# Patient Record
Sex: Female | Born: 1960 | Race: Black or African American | Hispanic: No | Marital: Married | Smoking: Never smoker
Health system: Southern US, Community
[De-identification: ages and names within clinical notes are randomized; demographics above are authoritative.]

## PROBLEM LIST (undated history)

## (undated) DIAGNOSIS — C50919 Malignant neoplasm of unspecified site of unspecified female breast: Secondary | ICD-10-CM

## (undated) DIAGNOSIS — Z9221 Personal history of antineoplastic chemotherapy: Secondary | ICD-10-CM

## (undated) DIAGNOSIS — G629 Polyneuropathy, unspecified: Secondary | ICD-10-CM

## (undated) DIAGNOSIS — M549 Dorsalgia, unspecified: Secondary | ICD-10-CM

## (undated) DIAGNOSIS — J45909 Unspecified asthma, uncomplicated: Secondary | ICD-10-CM

## (undated) DIAGNOSIS — Z923 Personal history of irradiation: Secondary | ICD-10-CM

## (undated) DIAGNOSIS — E119 Type 2 diabetes mellitus without complications: Secondary | ICD-10-CM

## (undated) DIAGNOSIS — E7800 Pure hypercholesterolemia, unspecified: Secondary | ICD-10-CM

## (undated) DIAGNOSIS — I10 Essential (primary) hypertension: Secondary | ICD-10-CM

## (undated) HISTORY — PX: TONSILLECTOMY: SUR1361

## (undated) HISTORY — DX: Malignant neoplasm of unspecified site of unspecified female breast: C50.919

## (undated) HISTORY — PX: BACK SURGERY: SHX140

## (undated) HISTORY — DX: Type 2 diabetes mellitus without complications: E11.9

## (undated) HISTORY — DX: Dorsalgia, unspecified: M54.9

## (undated) HISTORY — DX: Pure hypercholesterolemia, unspecified: E78.00

## (undated) HISTORY — PX: COLONOSCOPY: SHX174

## (undated) HISTORY — DX: Unspecified asthma, uncomplicated: J45.909

## (undated) HISTORY — DX: Polyneuropathy, unspecified: G62.9

## (undated) HISTORY — PX: KNEE SURGERY: SHX244

## (undated) HISTORY — PX: BREAST LUMPECTOMY: SHX2

---

## 2006-01-31 DIAGNOSIS — Z923 Personal history of irradiation: Secondary | ICD-10-CM

## 2006-01-31 DIAGNOSIS — C50919 Malignant neoplasm of unspecified site of unspecified female breast: Secondary | ICD-10-CM

## 2006-01-31 HISTORY — DX: Malignant neoplasm of unspecified site of unspecified female breast: C50.919

## 2006-01-31 HISTORY — DX: Personal history of irradiation: Z92.3

## 2013-06-17 DIAGNOSIS — C50919 Malignant neoplasm of unspecified site of unspecified female breast: Secondary | ICD-10-CM | POA: Diagnosis not present

## 2013-07-04 DIAGNOSIS — E559 Vitamin D deficiency, unspecified: Secondary | ICD-10-CM | POA: Diagnosis not present

## 2013-07-04 DIAGNOSIS — E119 Type 2 diabetes mellitus without complications: Secondary | ICD-10-CM | POA: Diagnosis not present

## 2013-07-04 DIAGNOSIS — E785 Hyperlipidemia, unspecified: Secondary | ICD-10-CM | POA: Diagnosis not present

## 2013-07-04 DIAGNOSIS — E042 Nontoxic multinodular goiter: Secondary | ICD-10-CM | POA: Diagnosis not present

## 2013-07-04 DIAGNOSIS — E041 Nontoxic single thyroid nodule: Secondary | ICD-10-CM | POA: Diagnosis not present

## 2013-07-09 DIAGNOSIS — H04129 Dry eye syndrome of unspecified lacrimal gland: Secondary | ICD-10-CM | POA: Diagnosis not present

## 2013-07-09 DIAGNOSIS — E109 Type 1 diabetes mellitus without complications: Secondary | ICD-10-CM | POA: Diagnosis not present

## 2013-07-09 DIAGNOSIS — H2510 Age-related nuclear cataract, unspecified eye: Secondary | ICD-10-CM | POA: Diagnosis not present

## 2013-07-09 DIAGNOSIS — H43399 Other vitreous opacities, unspecified eye: Secondary | ICD-10-CM | POA: Diagnosis not present

## 2013-08-15 DIAGNOSIS — E041 Nontoxic single thyroid nodule: Secondary | ICD-10-CM | POA: Diagnosis not present

## 2013-08-15 DIAGNOSIS — E119 Type 2 diabetes mellitus without complications: Secondary | ICD-10-CM | POA: Diagnosis not present

## 2013-08-20 DIAGNOSIS — C50919 Malignant neoplasm of unspecified site of unspecified female breast: Secondary | ICD-10-CM | POA: Diagnosis not present

## 2013-08-20 DIAGNOSIS — Z1231 Encounter for screening mammogram for malignant neoplasm of breast: Secondary | ICD-10-CM | POA: Diagnosis not present

## 2013-09-04 DIAGNOSIS — Z23 Encounter for immunization: Secondary | ICD-10-CM | POA: Diagnosis not present

## 2013-10-08 DIAGNOSIS — IMO0002 Reserved for concepts with insufficient information to code with codable children: Secondary | ICD-10-CM | POA: Diagnosis not present

## 2013-10-08 DIAGNOSIS — Z79899 Other long term (current) drug therapy: Secondary | ICD-10-CM | POA: Diagnosis not present

## 2013-10-08 DIAGNOSIS — M5126 Other intervertebral disc displacement, lumbar region: Secondary | ICD-10-CM | POA: Diagnosis not present

## 2013-10-08 DIAGNOSIS — F339 Major depressive disorder, recurrent, unspecified: Secondary | ICD-10-CM | POA: Diagnosis not present

## 2013-10-08 DIAGNOSIS — G894 Chronic pain syndrome: Secondary | ICD-10-CM | POA: Diagnosis not present

## 2013-10-09 DIAGNOSIS — Z79899 Other long term (current) drug therapy: Secondary | ICD-10-CM | POA: Diagnosis not present

## 2013-10-17 DIAGNOSIS — E041 Nontoxic single thyroid nodule: Secondary | ICD-10-CM | POA: Diagnosis not present

## 2013-10-17 DIAGNOSIS — E042 Nontoxic multinodular goiter: Secondary | ICD-10-CM | POA: Diagnosis not present

## 2013-10-24 DIAGNOSIS — E7800 Pure hypercholesterolemia, unspecified: Secondary | ICD-10-CM | POA: Diagnosis not present

## 2013-10-24 DIAGNOSIS — I10 Essential (primary) hypertension: Secondary | ICD-10-CM | POA: Diagnosis not present

## 2013-10-24 DIAGNOSIS — IMO0001 Reserved for inherently not codable concepts without codable children: Secondary | ICD-10-CM | POA: Diagnosis not present

## 2013-10-24 DIAGNOSIS — E041 Nontoxic single thyroid nodule: Secondary | ICD-10-CM | POA: Diagnosis not present

## 2013-11-05 DIAGNOSIS — M5417 Radiculopathy, lumbosacral region: Secondary | ICD-10-CM | POA: Diagnosis not present

## 2013-11-05 DIAGNOSIS — Z79899 Other long term (current) drug therapy: Secondary | ICD-10-CM | POA: Diagnosis not present

## 2013-11-05 DIAGNOSIS — M791 Myalgia: Secondary | ICD-10-CM | POA: Diagnosis not present

## 2013-11-05 DIAGNOSIS — M545 Low back pain: Secondary | ICD-10-CM | POA: Diagnosis not present

## 2013-11-05 DIAGNOSIS — R825 Elevated urine levels of drugs, medicaments and biological substances: Secondary | ICD-10-CM | POA: Diagnosis not present

## 2013-11-05 DIAGNOSIS — Z79891 Long term (current) use of opiate analgesic: Secondary | ICD-10-CM | POA: Diagnosis not present

## 2013-11-05 DIAGNOSIS — G894 Chronic pain syndrome: Secondary | ICD-10-CM | POA: Diagnosis not present

## 2013-11-12 DIAGNOSIS — M792 Neuralgia and neuritis, unspecified: Secondary | ICD-10-CM | POA: Diagnosis not present

## 2013-11-12 DIAGNOSIS — M5417 Radiculopathy, lumbosacral region: Secondary | ICD-10-CM | POA: Diagnosis not present

## 2013-11-12 DIAGNOSIS — G894 Chronic pain syndrome: Secondary | ICD-10-CM | POA: Diagnosis not present

## 2013-12-10 DIAGNOSIS — M5127 Other intervertebral disc displacement, lumbosacral region: Secondary | ICD-10-CM | POA: Diagnosis not present

## 2013-12-10 DIAGNOSIS — M792 Neuralgia and neuritis, unspecified: Secondary | ICD-10-CM | POA: Diagnosis not present

## 2013-12-10 DIAGNOSIS — M5417 Radiculopathy, lumbosacral region: Secondary | ICD-10-CM | POA: Diagnosis not present

## 2013-12-10 DIAGNOSIS — G894 Chronic pain syndrome: Secondary | ICD-10-CM | POA: Diagnosis not present

## 2014-01-02 DIAGNOSIS — Z79899 Other long term (current) drug therapy: Secondary | ICD-10-CM | POA: Diagnosis not present

## 2014-01-09 DIAGNOSIS — C50911 Malignant neoplasm of unspecified site of right female breast: Secondary | ICD-10-CM | POA: Diagnosis not present

## 2014-01-09 DIAGNOSIS — C50919 Malignant neoplasm of unspecified site of unspecified female breast: Secondary | ICD-10-CM | POA: Diagnosis not present

## 2014-01-16 DIAGNOSIS — T451X5A Adverse effect of antineoplastic and immunosuppressive drugs, initial encounter: Secondary | ICD-10-CM | POA: Diagnosis not present

## 2014-01-16 DIAGNOSIS — E041 Nontoxic single thyroid nodule: Secondary | ICD-10-CM | POA: Diagnosis not present

## 2014-01-16 DIAGNOSIS — E1165 Type 2 diabetes mellitus with hyperglycemia: Secondary | ICD-10-CM | POA: Diagnosis not present

## 2014-01-16 DIAGNOSIS — I10 Essential (primary) hypertension: Secondary | ICD-10-CM | POA: Diagnosis not present

## 2014-01-16 DIAGNOSIS — G620 Drug-induced polyneuropathy: Secondary | ICD-10-CM | POA: Diagnosis not present

## 2014-02-05 DIAGNOSIS — J45901 Unspecified asthma with (acute) exacerbation: Secondary | ICD-10-CM | POA: Diagnosis not present

## 2014-02-05 DIAGNOSIS — E119 Type 2 diabetes mellitus without complications: Secondary | ICD-10-CM | POA: Diagnosis not present

## 2014-02-05 DIAGNOSIS — R05 Cough: Secondary | ICD-10-CM | POA: Diagnosis not present

## 2014-02-05 DIAGNOSIS — I10 Essential (primary) hypertension: Secondary | ICD-10-CM | POA: Diagnosis not present

## 2014-02-27 DIAGNOSIS — Z79891 Long term (current) use of opiate analgesic: Secondary | ICD-10-CM | POA: Diagnosis not present

## 2014-03-06 DIAGNOSIS — C50911 Malignant neoplasm of unspecified site of right female breast: Secondary | ICD-10-CM | POA: Diagnosis not present

## 2014-03-13 DIAGNOSIS — M5417 Radiculopathy, lumbosacral region: Secondary | ICD-10-CM | POA: Diagnosis not present

## 2014-03-13 DIAGNOSIS — G894 Chronic pain syndrome: Secondary | ICD-10-CM | POA: Diagnosis not present

## 2014-03-13 DIAGNOSIS — M5127 Other intervertebral disc displacement, lumbosacral region: Secondary | ICD-10-CM | POA: Diagnosis not present

## 2014-03-13 DIAGNOSIS — M792 Neuralgia and neuritis, unspecified: Secondary | ICD-10-CM | POA: Diagnosis not present

## 2014-03-27 DIAGNOSIS — Z79891 Long term (current) use of opiate analgesic: Secondary | ICD-10-CM | POA: Diagnosis not present

## 2014-04-24 DIAGNOSIS — Z79891 Long term (current) use of opiate analgesic: Secondary | ICD-10-CM | POA: Diagnosis not present

## 2014-05-05 DIAGNOSIS — M791 Myalgia: Secondary | ICD-10-CM | POA: Diagnosis not present

## 2014-05-05 DIAGNOSIS — M47817 Spondylosis without myelopathy or radiculopathy, lumbosacral region: Secondary | ICD-10-CM | POA: Diagnosis not present

## 2014-05-05 DIAGNOSIS — G894 Chronic pain syndrome: Secondary | ICD-10-CM | POA: Diagnosis not present

## 2014-05-08 DIAGNOSIS — E042 Nontoxic multinodular goiter: Secondary | ICD-10-CM | POA: Diagnosis not present

## 2014-05-08 DIAGNOSIS — E119 Type 2 diabetes mellitus without complications: Secondary | ICD-10-CM | POA: Diagnosis not present

## 2014-05-08 DIAGNOSIS — G629 Polyneuropathy, unspecified: Secondary | ICD-10-CM | POA: Diagnosis not present

## 2014-05-08 DIAGNOSIS — I10 Essential (primary) hypertension: Secondary | ICD-10-CM | POA: Diagnosis not present

## 2014-05-08 DIAGNOSIS — E559 Vitamin D deficiency, unspecified: Secondary | ICD-10-CM | POA: Diagnosis not present

## 2014-05-08 DIAGNOSIS — E780 Pure hypercholesterolemia: Secondary | ICD-10-CM | POA: Diagnosis not present

## 2014-05-22 DIAGNOSIS — Z79891 Long term (current) use of opiate analgesic: Secondary | ICD-10-CM | POA: Diagnosis not present

## 2014-06-03 DIAGNOSIS — M47817 Spondylosis without myelopathy or radiculopathy, lumbosacral region: Secondary | ICD-10-CM | POA: Diagnosis not present

## 2014-06-03 DIAGNOSIS — M791 Myalgia: Secondary | ICD-10-CM | POA: Diagnosis not present

## 2014-06-03 DIAGNOSIS — G894 Chronic pain syndrome: Secondary | ICD-10-CM | POA: Diagnosis not present

## 2014-06-12 DIAGNOSIS — C50911 Malignant neoplasm of unspecified site of right female breast: Secondary | ICD-10-CM | POA: Diagnosis not present

## 2014-06-16 DIAGNOSIS — Z79891 Long term (current) use of opiate analgesic: Secondary | ICD-10-CM | POA: Diagnosis not present

## 2014-07-23 DIAGNOSIS — Z79891 Long term (current) use of opiate analgesic: Secondary | ICD-10-CM | POA: Diagnosis not present

## 2014-08-18 DIAGNOSIS — Z79891 Long term (current) use of opiate analgesic: Secondary | ICD-10-CM | POA: Diagnosis not present

## 2014-08-18 DIAGNOSIS — M4316 Spondylolisthesis, lumbar region: Secondary | ICD-10-CM | POA: Diagnosis not present

## 2014-08-18 DIAGNOSIS — M5416 Radiculopathy, lumbar region: Secondary | ICD-10-CM | POA: Diagnosis not present

## 2014-08-18 DIAGNOSIS — M961 Postlaminectomy syndrome, not elsewhere classified: Secondary | ICD-10-CM | POA: Diagnosis not present

## 2014-08-18 DIAGNOSIS — Z1389 Encounter for screening for other disorder: Secondary | ICD-10-CM | POA: Diagnosis not present

## 2014-08-18 DIAGNOSIS — M4806 Spinal stenosis, lumbar region: Secondary | ICD-10-CM | POA: Diagnosis not present

## 2014-10-10 DIAGNOSIS — M5416 Radiculopathy, lumbar region: Secondary | ICD-10-CM | POA: Diagnosis not present

## 2014-10-17 DIAGNOSIS — M545 Low back pain: Secondary | ICD-10-CM | POA: Diagnosis not present

## 2014-10-20 DIAGNOSIS — E1036 Type 1 diabetes mellitus with diabetic cataract: Secondary | ICD-10-CM | POA: Diagnosis not present

## 2014-10-20 DIAGNOSIS — H35363 Drusen (degenerative) of macula, bilateral: Secondary | ICD-10-CM | POA: Diagnosis not present

## 2014-10-24 DIAGNOSIS — M5416 Radiculopathy, lumbar region: Secondary | ICD-10-CM | POA: Diagnosis not present

## 2014-10-29 DIAGNOSIS — E1165 Type 2 diabetes mellitus with hyperglycemia: Secondary | ICD-10-CM | POA: Diagnosis not present

## 2014-10-29 DIAGNOSIS — I10 Essential (primary) hypertension: Secondary | ICD-10-CM | POA: Diagnosis not present

## 2014-10-29 DIAGNOSIS — Z23 Encounter for immunization: Secondary | ICD-10-CM | POA: Diagnosis not present

## 2014-10-29 DIAGNOSIS — J45909 Unspecified asthma, uncomplicated: Secondary | ICD-10-CM | POA: Diagnosis not present

## 2014-10-29 DIAGNOSIS — M5126 Other intervertebral disc displacement, lumbar region: Secondary | ICD-10-CM | POA: Diagnosis not present

## 2014-11-03 DIAGNOSIS — M5416 Radiculopathy, lumbar region: Secondary | ICD-10-CM | POA: Diagnosis not present

## 2014-11-25 DIAGNOSIS — I10 Essential (primary) hypertension: Secondary | ICD-10-CM | POA: Diagnosis not present

## 2014-11-25 DIAGNOSIS — M5126 Other intervertebral disc displacement, lumbar region: Secondary | ICD-10-CM | POA: Diagnosis not present

## 2014-11-25 DIAGNOSIS — G629 Polyneuropathy, unspecified: Secondary | ICD-10-CM | POA: Diagnosis not present

## 2014-11-25 DIAGNOSIS — E1165 Type 2 diabetes mellitus with hyperglycemia: Secondary | ICD-10-CM | POA: Diagnosis not present

## 2014-12-02 DIAGNOSIS — M5416 Radiculopathy, lumbar region: Secondary | ICD-10-CM | POA: Diagnosis not present

## 2014-12-08 ENCOUNTER — Ambulatory Visit (HOSPITAL_COMMUNITY): Payer: Self-pay | Admitting: Hematology & Oncology

## 2014-12-15 DIAGNOSIS — M4317 Spondylolisthesis, lumbosacral region: Secondary | ICD-10-CM | POA: Diagnosis not present

## 2014-12-22 ENCOUNTER — Encounter (HOSPITAL_COMMUNITY): Payer: BLUE CROSS/BLUE SHIELD | Attending: Hematology & Oncology | Admitting: Hematology & Oncology

## 2014-12-22 ENCOUNTER — Encounter (HOSPITAL_COMMUNITY): Payer: Self-pay | Admitting: Hematology & Oncology

## 2014-12-22 VITALS — BP 140/85 | HR 18 | Temp 98.5°F | Ht 64.0 in | Wt 150.8 lb

## 2014-12-22 DIAGNOSIS — E559 Vitamin D deficiency, unspecified: Secondary | ICD-10-CM | POA: Diagnosis not present

## 2014-12-22 DIAGNOSIS — Z139 Encounter for screening, unspecified: Secondary | ICD-10-CM | POA: Diagnosis not present

## 2014-12-22 DIAGNOSIS — Z780 Asymptomatic menopausal state: Secondary | ICD-10-CM | POA: Insufficient documentation

## 2014-12-22 DIAGNOSIS — Z79899 Other long term (current) drug therapy: Secondary | ICD-10-CM | POA: Diagnosis not present

## 2014-12-22 DIAGNOSIS — M549 Dorsalgia, unspecified: Secondary | ICD-10-CM | POA: Diagnosis not present

## 2014-12-22 DIAGNOSIS — Z79811 Long term (current) use of aromatase inhibitors: Secondary | ICD-10-CM | POA: Diagnosis not present

## 2014-12-22 DIAGNOSIS — C50412 Malignant neoplasm of upper-outer quadrant of left female breast: Secondary | ICD-10-CM | POA: Insufficient documentation

## 2014-12-22 DIAGNOSIS — C50912 Malignant neoplasm of unspecified site of left female breast: Secondary | ICD-10-CM

## 2014-12-22 DIAGNOSIS — G620 Drug-induced polyneuropathy: Secondary | ICD-10-CM | POA: Diagnosis not present

## 2014-12-22 DIAGNOSIS — Z9889 Other specified postprocedural states: Secondary | ICD-10-CM

## 2014-12-22 LAB — COMPREHENSIVE METABOLIC PANEL
ALT: 16 U/L (ref 14–54)
AST: 18 U/L (ref 15–41)
Albumin: 4 g/dL (ref 3.5–5.0)
Alkaline Phosphatase: 62 U/L (ref 38–126)
Anion gap: 7 (ref 5–15)
BUN: 11 mg/dL (ref 6–20)
CO2: 31 mmol/L (ref 22–32)
Calcium: 10.2 mg/dL (ref 8.9–10.3)
Chloride: 102 mmol/L (ref 101–111)
Creatinine, Ser: 0.67 mg/dL (ref 0.44–1.00)
GFR calc Af Amer: >60 mL/min (ref >60)
GFR calc non Af Amer: >60 mL/min (ref >60)
Glucose, Bld: 72 mg/dL (ref 65–99)
Potassium: 3.9 mmol/L (ref 3.5–5.1)
Sodium: 140 mmol/L (ref 135–145)
Total Bilirubin: 0.4 mg/dL (ref 0.3–1.2)
Total Protein: 8.2 g/dL — ABNORMAL HIGH (ref 6.5–8.1)

## 2014-12-22 LAB — CBC WITH DIFFERENTIAL/PLATELET
Basophils Absolute: 0 10*3/uL (ref 0.0–0.1)
Basophils Relative: 0 %
Eosinophils Absolute: 0.1 10*3/uL (ref 0.0–0.7)
Eosinophils Relative: 1 %
HCT: 40.2 % (ref 36.0–46.0)
Hemoglobin: 13.2 g/dL (ref 12.0–15.0)
Lymphocytes Relative: 32 %
Lymphs Abs: 3.3 10*3/uL (ref 0.7–4.0)
MCH: 26.8 pg (ref 26.0–34.0)
MCHC: 32.8 g/dL (ref 30.0–36.0)
MCV: 81.7 fL (ref 78.0–100.0)
Monocytes Absolute: 0.7 10*3/uL (ref 0.1–1.0)
Monocytes Relative: 7 %
Neutro Abs: 6 10*3/uL (ref 1.7–7.7)
Neutrophils Relative %: 60 %
Platelets: 308 10*3/uL (ref 150–400)
RBC: 4.92 MIL/uL (ref 3.87–5.11)
RDW: 14.4 % (ref 11.5–15.5)
WBC: 10.2 10*3/uL (ref 4.0–10.5)
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
## 2014-12-22 NOTE — Progress Notes (Signed)
[name]'s reason for visit today is for labs as scheduled per MD orders.  Venipuncture performed with a 23 gauge butterfly needle to R Antecubital.  Krista Doyle tolerated procedure well and without incident; questions were answered and patient was discharged.

## 2014-12-22 NOTE — Patient Instructions (Addendum)
[place] at [facility] Discharge Instructions  RECOMMENDATIONS MADE BY THE CONSULTANT AND ANY TEST RESULTS WILL BE SENT TO YOUR REFERRING PHYSICIAN.   Exam completed by Dr Whitney Muse today. Lab work today. Prescription given for compression sleeve and glove. We will put you on the list for genetic counseling, we will call you with the appt.  We will get the records from [facility].   We will get you scheduled for a mammogram.  We will get a bone density scheduled for you also. Start taking Calcium 1200 mg with vitamin D 1000-2000 mg. Return to see the doctor in 6 months to see the doctor. Please call the clinic if you have any questions or concerns.     Thank you for choosing [place] at [facility] to provide your oncology and hematology care.  To afford each patient quality time with our provider, please arrive at least 15 minutes before your scheduled appointment time.    You need to re-schedule your appointment should you arrive 10 or more minutes late.  We strive to give you quality time with our providers, and arriving late affects you and other patients whose appointments are after yours.  Also, if you no show three or more times for appointments you may be dismissed from the clinic at the providers discretion.     Again, thank you for choosing [facility].  Our hope is that these requests will decrease the amount of time that you wait before being seen by our physicians.       _____________________________________________________________  Should you have questions after your visit to [facility], please contact our office at [phone] between the hours of 8:30 a.m. and 4:30 p.m.  Voicemails left after 4:30 p.m. will not be returned until the following business day.  For prescription refill requests, have your pharmacy contact our office.

## 2014-12-23 ENCOUNTER — Other Ambulatory Visit (HOSPITAL_COMMUNITY): Payer: Self-pay | Admitting: *Deleted

## 2014-12-23 LAB — VITAMIN D 25 HYDROXY (VIT D DEFICIENCY, FRACTURES): Vit D, 25-Hydroxy: 9.3 ng/mL — ABNORMAL LOW (ref 30.0–100.0)

## 2014-12-23 MED ORDER — ERGOCALCIFEROL 1.25 MG (50000 UT) PO CAPS: 50000.0000 [IU] | ORAL_CAPSULE | ORAL | Status: DC

## 2014-12-31 ENCOUNTER — Other Ambulatory Visit (HOSPITAL_COMMUNITY): Payer: BLUE CROSS/BLUE SHIELD

## 2014-12-31 ENCOUNTER — Ambulatory Visit (HOSPITAL_COMMUNITY): Payer: BLUE CROSS/BLUE SHIELD

## 2014-12-31 ENCOUNTER — Other Ambulatory Visit (HOSPITAL_COMMUNITY): Payer: Self-pay | Admitting: Hematology & Oncology

## 2015-01-02 ENCOUNTER — Encounter: Payer: Self-pay | Admitting: Physical Medicine & Rehabilitation

## 2015-01-07 ENCOUNTER — Other Ambulatory Visit (HOSPITAL_COMMUNITY): Payer: BLUE CROSS/BLUE SHIELD

## 2015-01-29 ENCOUNTER — Encounter (HOSPITAL_COMMUNITY): Payer: BLUE CROSS/BLUE SHIELD | Admitting: Genetic Counselor

## 2015-02-02 DIAGNOSIS — E559 Vitamin D deficiency, unspecified: Secondary | ICD-10-CM | POA: Insufficient documentation

## 2015-02-11 ENCOUNTER — Encounter: Payer: Self-pay | Admitting: Physical Medicine & Rehabilitation

## 2015-02-11 ENCOUNTER — Encounter
Payer: BLUE CROSS/BLUE SHIELD | Attending: Physical Medicine & Rehabilitation | Admitting: Physical Medicine & Rehabilitation

## 2015-02-11 VITALS — BP 119/79 | HR 73 | Resp 14

## 2015-02-11 DIAGNOSIS — Z79899 Other long term (current) drug therapy: Secondary | ICD-10-CM

## 2015-02-11 DIAGNOSIS — G8929 Other chronic pain: Secondary | ICD-10-CM | POA: Diagnosis not present

## 2015-02-11 DIAGNOSIS — I890 Lymphedema, not elsewhere classified: Secondary | ICD-10-CM | POA: Diagnosis not present

## 2015-02-11 DIAGNOSIS — G620 Drug-induced polyneuropathy: Secondary | ICD-10-CM | POA: Diagnosis not present

## 2015-02-11 DIAGNOSIS — C50412 Malignant neoplasm of upper-outer quadrant of left female breast: Secondary | ICD-10-CM | POA: Diagnosis not present

## 2015-02-11 DIAGNOSIS — M545 Low back pain: Secondary | ICD-10-CM | POA: Insufficient documentation

## 2015-02-11 DIAGNOSIS — M5416 Radiculopathy, lumbar region: Secondary | ICD-10-CM | POA: Diagnosis not present

## 2015-02-11 DIAGNOSIS — Z853 Personal history of malignant neoplasm of breast: Secondary | ICD-10-CM | POA: Insufficient documentation

## 2015-02-11 DIAGNOSIS — Z5181 Encounter for therapeutic drug level monitoring: Secondary | ICD-10-CM | POA: Diagnosis not present

## 2015-02-11 DIAGNOSIS — Z9221 Personal history of antineoplastic chemotherapy: Secondary | ICD-10-CM | POA: Diagnosis not present

## 2015-02-11 DIAGNOSIS — M47816 Spondylosis without myelopathy or radiculopathy, lumbar region: Secondary | ICD-10-CM

## 2015-02-11 DIAGNOSIS — T451X5A Adverse effect of antineoplastic and immunosuppressive drugs, initial encounter: Secondary | ICD-10-CM | POA: Diagnosis not present

## 2015-02-11 MED ORDER — LIDOCAINE 5 % EX PTCH: 1.0000 | MEDICATED_PATCH | CUTANEOUS | Status: DC

## 2015-02-11 MED ORDER — PREGABALIN 50 MG PO CAPS
50.0000 mg | ORAL_CAPSULE | Freq: Three times a day (TID) | ORAL | Status: DC
Start: 2015-02-11 — End: 2015-03-11

## 2015-02-11 NOTE — Patient Instructions (Signed)
ONCE I HAVE CONFIRMATION THAT YOUR URINE SPECIMEN IS CONSISTENT WITH YOUR HISTORY AND PRESCRIBED MEDICATIONS, I WILL BE WILLING TO PRESCRIBE YOUR PAIN MEDICATION. THE RESULTS OF YOUR URINE TESTING COULD TAKE A WEEK OR MORE TO RETURN, HOWEVER.  IF WE DO NOT CONTACT YOU REGARDING THESE RESULTS WITHIN 10 DAYS, PLEASE CONTACT US.     

## 2015-02-11 NOTE — Progress Notes (Signed)
Subjective:    Patient ID: Krista Doyle, female    DOB: 12-Jun-1960, 56 y.o.   MRN: [id number]  HPI   This is an initial office visit for Krista Doyle who was referred here by Dr. Phylliss Bob. She is a pleasant 55 yo [place] American female with chronic low back pain. She moved to [place] over the summer from [place] and had been followed previously by a spine clinic where she has received numerous injections and pharmaceutical therapy for her pain. Xrays done by Dr. Lynann Bologna in September revealed grade 1 spondylolisthesis of L4 on L5 with no other obviously abnormalities by his report. She states that surgery had been recommended by her spine surgeon in [place] to "relieve pressure on the nerves" in her low back. She has had chronic numbness in her feet and hand due to CTX for her breast cancer (last rx in 2011). Her low back pain is continuous and typically radiates down her right leg into the foot--the pain is sometimes anterior and sometimes posterior on the right leg. She has difficulty finding any comfortable positions. She has to move constantly and re-arrange pillows at night. She can only walk short dx before she has to stop because of the low back pain. She has had some falls because of her low back and right leg pain as well as the numbness in her feet. She is using a walker which has helped her quite a bit.  Dr. Lynann Bologna   arranged ESI's per Dr. Ronalee Red ,at her L4-5 level apparently, without any results. She reports no benefits with prior injections in [place] either.   For pain relief she's currently using hydrocodone 2-3 x per day. She has been stretching them in anticipation of this visit. In [place] she had been taking percocet 10/325, two-three x per day which gave her partial relief. She had been on lyrica and gabapentin for her nerve related pain---she is not sure if they helped. She may have taken cymbalta also. She was given a trial of meloxicam this fall (2 weeks) without  benefit  She also struggles with lymphedema in her LUE related to her breast cancer and associated surgeries (x3). She is on femara currently and her cancer is in remission.   Pain Inventory Average Pain 8 Pain Right Now 8 My pain is constant, stabbing and aching  In the last 24 hours, has pain interfered with the following? General activity 8 Relation with others 10 Enjoyment of life 9 What TIME of day is your pain at its worst? evening, night Sleep (in general) Poor  Pain is worse with: walking, bending, sitting, standing and some activites Pain improves with: medication Relief from Meds: 6  Mobility walk without assistance walk with assistance use a cane use a walker ability to climb steps?  no do you drive?  yes  Function disabled: date disabled .  Neuro/Psych weakness numbness tingling trouble walking  Prior Studies new visit  Physicians involved in your care new visit   History reviewed. No pertinent family history. Social History   Social History  . Marital Status: Married    Spouse Name: N/A  . Number of Children: N/A  . Years of Education: N/A   Social History Main Topics  . Smoking status: Never Smoker   . Smokeless tobacco: None  . Alcohol Use: No  . Drug Use: None  . Sexual Activity: Yes   Other Topics Concern  . None   Social History Narrative   History reviewed.  No pertinent past surgical history. Past Medical History  Diagnosis Date  . Breast cancer ([facility])     left   BP 119/79 mmHg  Pulse 73  Resp 14  SpO2 99%  Opioid Risk Score:   Fall Risk Score:  `1  Depression screen PHQ 2/9  Depression screen PHQ 2/9 02/11/2015  Decreased Interest 1  Down, Depressed, Hopeless 0  PHQ - 2 Score 1  Altered sleeping 3  Tired, decreased energy 0  Change in appetite 0  Feeling bad or failure about yourself  0  Trouble concentrating 0  Moving slowly or fidgety/restless 0  Suicidal thoughts 0  PHQ-9 Score 4  Difficult doing  work/chores Somewhat difficult     Review of Systems  Cardiovascular: Positive for leg swelling.  Endocrine:       High blood sugar   All other systems reviewed and are negative.      Objective:   Physical Exam   General: Alert and oriented x 3, No apparent distress HEENT: Head is normocephalic, atraumatic, PERRLA, EOMI, sclera anicteric, oral mucosa pink and moist, dentition intact, ext ear canals clear,  Neck: Supple without JVD or lymphadenopathy Heart: Reg rate and rhythm. No murmurs rubs or gallops Chest: CTA bilaterally without wheezes, rales, or rhonchi; no distress Abdomen: Soft, non-tender, non-distended, bowel sounds positive. Extremities: No clubbing, cyanosis, or edema. Pulses are 2+ Skin: Clean and intact without signs of breakdown Neuro: Pt is cognitively appropriate with normal insight, memory, and awareness. Cranial nerves 2-12 are intact. Sensory exam is diminished to LT in the finger tips as well as the distal half of both feet (perhaps sl loss in the proximal foot also). she has difficulty with balance as a result. . Reflexes are 1+ to absent in all 4's. Fine motor coordination is impaired due to pain,weakness, sensory loss. No tremors. Motor function is grossly inconsistent and 3-4/5 in the LE and 4-5/5 in the uppers.  Musculoskeletal: she is very limited with all planes of lumbar ROM---she has perhaps 20 lumbar flexion, 5 deg extension, 10 degrees Psych: Pt's affect is appropriate. Pt is cooperative        Assessment & Plan:  1. Chronic low back pain, lumbar spondylosis, spondylolisthesis, likely lumbar-sacral radiculopathy. Second hand report of lumbar xray images was fairly unremarkable  -need MRI results (study pending)  -lidoderm patch trial  -NSAID trial?   -UDS was collected. If consistent, we can look at prescribing narcotics. We can initiate a percocet trial closer to her home regimen. 10/325- two to three tabs daily prn  -consider other  intervention/therapy based on findings above.  2. Chemotherapy induced peripheral neuropathy  -lyrica trial. Begin at 50mg  bid and titrate to TID.  3. Breast Cancer in remission.  Thirty minutes of face to face patient care time were spent during this visit. All questions were encouraged and answered. We'll see her back in about a month.    Meredith Staggers, MD, [facility] 02/11/2015

## 2015-02-12 ENCOUNTER — Encounter (HOSPITAL_COMMUNITY): Payer: Self-pay | Admitting: Genetic Counselor

## 2015-02-12 ENCOUNTER — Encounter (HOSPITAL_COMMUNITY): Payer: BLUE CROSS/BLUE SHIELD

## 2015-02-12 ENCOUNTER — Telehealth: Payer: Self-pay | Admitting: Genetic Counselor

## 2015-02-12 ENCOUNTER — Encounter (HOSPITAL_COMMUNITY): Payer: BLUE CROSS/BLUE SHIELD | Attending: Hematology & Oncology | Admitting: Genetic Counselor

## 2015-02-12 DIAGNOSIS — Z780 Asymptomatic menopausal state: Secondary | ICD-10-CM | POA: Insufficient documentation

## 2015-02-12 DIAGNOSIS — Z853 Personal history of malignant neoplasm of breast: Secondary | ICD-10-CM | POA: Diagnosis not present

## 2015-02-12 DIAGNOSIS — Z79899 Other long term (current) drug therapy: Secondary | ICD-10-CM | POA: Insufficient documentation

## 2015-02-12 DIAGNOSIS — Z315 Encounter for genetic counseling: Secondary | ICD-10-CM

## 2015-02-12 DIAGNOSIS — Z139 Encounter for screening, unspecified: Secondary | ICD-10-CM | POA: Insufficient documentation

## 2015-02-12 DIAGNOSIS — C50412 Malignant neoplasm of upper-outer quadrant of left female breast: Secondary | ICD-10-CM

## 2015-02-12 NOTE — Progress Notes (Signed)
Krista Doyle's reason for visit today is for labs as scheduled per MD orders.  Venipuncture performed with a 23 gauge butterfly needle to R Antecubital.  Krista Doyle tolerated procedure well and without incident; questions were answered and patient was discharged.

## 2015-02-12 NOTE — Progress Notes (Signed)
REFERRING PROVIDER: [name], MD [place], [place] [address]   Ancil Linsey, MD  PRIMARY PROVIDER:  [name], MD  PRIMARY REASON FOR VISIT:  1. Breast cancer of upper-outer quadrant of left female breast ([place])      HISTORY OF PRESENT ILLNESS:   Ms. Schimek, a 55 y.o. female, was seen for a [place] cancer genetics consultation at the request of Dr. Whitney Muse due to a personal history of cancer.  Ms. Bress presents to clinic today to discuss the possibility of a hereditary predisposition to cancer, genetic testing, and to further clarify her future cancer risks, as well as potential cancer risks for family members.   In 2008, at the age of 68, Ms. Carter-Montagna was diagnosed with invasive ductal carcinoma of the breast. The tumor was ER+/PR+/Her2-.  This was treated with chemotherapy, lumpectomy and radiation.  She also took tamoxifen.    CANCER HISTORY:   No history exists.     HORMONAL RISK FACTORS:  Menarche was at age 73.  First live birth at age 23.  OCP use for approximately 2 years.  Ovaries intact: yes.  Hysterectomy: yes.  Menopausal status: postmenopausal.  HRT use: 0 years. Colonoscopy: yes; normal. Mammogram within the last year: yes. Number of breast biopsies: 2. Up to date with pelvic exams:  yes. Any excessive radiation exposure in the past:  no  Past Medical History  Diagnosis Date  . Breast cancer ([place])     left    History reviewed. No pertinent past surgical history.  Social History   Social History  . Marital Status: Married    Spouse Name: N/A  . Number of Children: N/A  . Years of Education: N/A   Social History Main Topics  . Smoking status: Never Smoker   . Smokeless tobacco: None  . Alcohol Use: No  . Drug Use: None  . Sexual Activity: Yes   Other Topics Concern  . None   Social History Narrative     FAMILY HISTORY:  We obtained a detailed, 4-generation family history.  Significant  diagnoses are listed below: History reviewed. No pertinent family history.  The patient has three biological children and two adopted children who are cancer free.  She has two brothers and a sister who are cancer free.  Both parents are deceased.  Her mother died at age 28 from a gun shot wound, and her father died at 12 from a car accident.  Both parents were only children.  The patient's maternal grandparents are deceased, and her paternal grandmother is alive at 12.  Patient's maternal ancestors are of African American descent, and paternal ancestors are of African American descent. There is no reported Ashkenazi Jewish ancestry. There is no known consanguinity.  GENETIC COUNSELING ASSESSMENT: Lylith Bebeau is a 55 y.o. female with a personal history of cancer and a limited family history which is somewhat suggestive of a hereditary cancer syndrome and predisposition to cancer. We, therefore, discussed and recommended the following at today's visit.   DISCUSSION: We discussed that 5-10% of breast cancer is due to hereditary cancer syndromes.  Most are the result of BRCA mutations.  There are other genes that can also increase the risk for breast cancer, and the most common genes seen in our population include PALB2, ATM and CHEK2.  The patient has a limited family history.  Her sister is under 46, and both parents are only children.  Her mother died at age 21.  We reviewed the characteristics,  features and inheritance patterns of hereditary cancer syndromes. We also discussed genetic testing, including the appropriate family members to test, the process of testing, insurance coverage and turn-around-time for results. We discussed the implications of a negative, positive and/or variant of uncertain significant result. We recommended Ms. Carter-Bacchi pursue genetic testing for the Breast/Ovarian cancer gene panel. The Breast/Ovarian gene panel offered by [facility] includes sequencing and rearrangement  analysis for the following 20 genes:  ATM, BARD1, BRCA1, BRCA2, BRIP1, CDH1, CHEK2, EPCAM, FANCC, MLH1, MSH2, MSH6, NBN, PALB2, PMS2, PTEN, RAD51C, RAD51D, TP53, and XRCC2.     Based on Ms. Carter-Schlicker's personal history of cancer, she meets medical criteria for genetic testing. Despite that she meets criteria, she may still have an out of pocket cost. We discussed that if her out of pocket cost for testing is over $100, the laboratory will call and confirm whether she wants to proceed with testing.  If the out of pocket cost of testing is less than $100 she will be billed by the genetic testing laboratory.   PLAN: After considering the risks, benefits, and limitations, Ms. Connelley  provided informed consent to pursue genetic testing and the blood sample was sent to [facility] for analysis of the Breast/Ovarian cancer panel. Results should be available within approximately 2-3 weeks' time, at which point they will be disclosed by telephone to Ms. Carter-Baldo, as will any additional recommendations warranted by these results. Ms. Bottari will receive a summary of her genetic counseling visit and a copy of her results once available. This information will also be available in Epic. We encouraged Ms. Carter-Jacques to remain in contact with cancer genetics annually so that we can continuously update the family history and inform her of any changes in cancer genetics and testing that may be of benefit for her family. Ms. Cummings questions were answered to her satisfaction today. Our contact information was provided should additional questions or concerns arise.  Lastly, we encouraged Ms. Carter-Landau to remain in contact with cancer genetics annually so that we can continuously update the family history and inform her of any changes in cancer genetics and testing that may be of benefit for this family.   Ms.  Cimini questions were answered to her satisfaction today. Our contact  information was provided should additional questions or concerns arise. Thank you for the referral and allowing [place] to share in the care of your patient.   Alaiya Martindelcampo P. Florene Glen, [place], [facility] Certified Genetic Counselor [email or web address] phone: [phone]  The patient was seen for a total of 60 minutes in face-to-face genetic counseling.  This patient was discussed with Drs. Magrinat, Lindi Adie and/or Burr Medico who agrees with the above.    _______________________________________________________________________ For Office Staff:  Number of people involved in session: 2 Was an Intern/ student involved with case: no

## 2015-02-12 NOTE — Telephone Encounter (Signed)
Confirmed with patient that she was going to keep her appointment.

## 2015-02-13 ENCOUNTER — Telehealth: Payer: Self-pay | Admitting: Physical Medicine & Rehabilitation

## 2015-02-13 NOTE — Telephone Encounter (Signed)
pt is returning a call about lidocaine patches - she still has not received them - pharmacy has faxed to the office but hasnt gotten a response - pt wanted medical ssistant to know what the pharmacy said

## 2015-02-13 NOTE — Addendum Note (Signed)
Addended by: Clarene Essex on: 02/13/2015 09:34 AM   Modules accepted: Miquel Dunn

## 2015-02-18 NOTE — Telephone Encounter (Signed)
Patient needing to know status of her pre auth for her Lidocaine patches-please call her at [phone].

## 2015-02-19 NOTE — Telephone Encounter (Signed)
Prior [facility] via covermymeds, patient notified

## 2015-02-20 ENCOUNTER — Telehealth: Payer: Self-pay | Admitting: *Deleted

## 2015-02-20 LAB — TOXASSURE SELECT,+ANTIDEPR,UR: PDF: 0

## 2015-02-20 MED ORDER — OXYCODONE-ACETAMINOPHEN 10-325 MG PO TABS: 1.0000 | ORAL_TABLET | Freq: Three times a day (TID) | ORAL | Status: DC | PRN

## 2015-02-20 NOTE — Telephone Encounter (Signed)
Pt was asked to call back regarding her UDS results. We received those results today. I read the results and discovered a very odd discrepancy in what was reported taken and not present in the urine and what was present present in the urine and yet not reported taken. It did not match up with the patients current medication list on multiple accounts. The patient was prescribed hydrocodone acetaminophen 10-325mg  by her pcp.  On the UDS report it listed that medication present in her urine but not reported, even though Enid Derry indicated on her UDS schedule that was the medication we were looking for.  On the other spectrum, the UDS report indicated she reported taking morphine, lorazepam, and clonazepam.  None of these medications are on the patients list.  I took my concerns to Tyson Dense and Anner Crete.  The conclusion is [facility] made an error in data input.  Johnette asked me to call the patient and reconfirm which medications the patient is taking or had been taking.  The patient stated she has never taken anxiety medications and morphine was taken a long time ago.  I asked her if she may have mis-reported these medications to [place] and she was addiment that she did not........Johnette has instructed me to label this UDS as consistent......Marland Kitchenpatient is out of medication and Johnette has given me permission to print a script and have Zella Ball sign so patient may pick up today.  Patient says she was previously prescribed percocet 10-325mg ,  #120 tabs per month and Dr. Charm Barges note indicates 2-3 tabs per day as needed for pain...Marland KitchenMarland KitchenWhat would you like the dispense count to be?

## 2015-02-20 NOTE — Telephone Encounter (Signed)
She has been off of this for months. My intention was to start with less and utilize other meds (per note) to treat her pain. i would like percocet 10/325 one q8 prn #90.  thanks

## 2015-02-20 NOTE — Telephone Encounter (Signed)
[name] is her to pick up prescription. Her UDS was obtained on 02/11/2015 by our Specimen processor [name] she wrote patient was on hydrocodone. [employer] had a discrepancy.Our manager Krista Doyle have been in contact with them. According to Dr. Naaman Plummer note we will prescribe Percocet 10/325 mg one tablet every 8 hour as needed. #90.

## 2015-02-27 ENCOUNTER — Ambulatory Visit (HOSPITAL_COMMUNITY)
Admission: RE | Admit: 2015-02-27 | Discharge: 2015-02-27 | Disposition: A | Discharge status: Home / Self Care | Payer: BLUE CROSS/BLUE SHIELD | Source: Ambulatory Visit | Attending: Hematology & Oncology | Admitting: Hematology & Oncology

## 2015-02-27 DIAGNOSIS — Z139 Encounter for screening, unspecified: Secondary | ICD-10-CM | POA: Insufficient documentation

## 2015-02-27 DIAGNOSIS — M85852 Other specified disorders of bone density and structure, left thigh: Secondary | ICD-10-CM | POA: Diagnosis not present

## 2015-02-27 DIAGNOSIS — E559 Vitamin D deficiency, unspecified: Secondary | ICD-10-CM | POA: Diagnosis not present

## 2015-02-27 DIAGNOSIS — Z79899 Other long term (current) drug therapy: Secondary | ICD-10-CM | POA: Diagnosis not present

## 2015-02-27 DIAGNOSIS — Z9889 Other specified postprocedural states: Secondary | ICD-10-CM | POA: Diagnosis not present

## 2015-02-27 DIAGNOSIS — Z780 Asymptomatic menopausal state: Secondary | ICD-10-CM

## 2015-02-27 DIAGNOSIS — C50412 Malignant neoplasm of upper-outer quadrant of left female breast: Secondary | ICD-10-CM | POA: Insufficient documentation

## 2015-02-27 DIAGNOSIS — M8580 Other specified disorders of bone density and structure, unspecified site: Secondary | ICD-10-CM | POA: Diagnosis not present

## 2015-02-27 DIAGNOSIS — Z1231 Encounter for screening mammogram for malignant neoplasm of breast: Secondary | ICD-10-CM | POA: Insufficient documentation

## 2015-03-02 ENCOUNTER — Ambulatory Visit (HOSPITAL_COMMUNITY): Payer: BLUE CROSS/BLUE SHIELD

## 2015-03-03 DIAGNOSIS — E1165 Type 2 diabetes mellitus with hyperglycemia: Secondary | ICD-10-CM | POA: Diagnosis not present

## 2015-03-03 DIAGNOSIS — M5126 Other intervertebral disc displacement, lumbar region: Secondary | ICD-10-CM | POA: Diagnosis not present

## 2015-03-03 DIAGNOSIS — J4520 Mild intermittent asthma, uncomplicated: Secondary | ICD-10-CM | POA: Diagnosis not present

## 2015-03-03 DIAGNOSIS — I10 Essential (primary) hypertension: Secondary | ICD-10-CM | POA: Diagnosis not present

## 2015-03-06 ENCOUNTER — Other Ambulatory Visit (HOSPITAL_COMMUNITY): Payer: Self-pay | Admitting: Emergency Medicine

## 2015-03-06 DIAGNOSIS — C50412 Malignant neoplasm of upper-outer quadrant of left female breast: Secondary | ICD-10-CM

## 2015-03-09 ENCOUNTER — Telehealth (HOSPITAL_COMMUNITY): Payer: Self-pay | Admitting: Hematology & Oncology

## 2015-03-09 NOTE — Telephone Encounter (Signed)
PER CLAIRE E AUTH IS NOT REQUIRED FOR [id number] PROLIA AND THAT [employer] IS THE 2NDRY INS. WILL CONFIRM WITH PT CALL REF# [name] [id number]

## 2015-03-11 ENCOUNTER — Encounter: Payer: Self-pay | Admitting: Physical Medicine & Rehabilitation

## 2015-03-11 ENCOUNTER — Encounter
Payer: BLUE CROSS/BLUE SHIELD | Attending: Physical Medicine & Rehabilitation | Admitting: Physical Medicine & Rehabilitation

## 2015-03-11 VITALS — BP 136/83 | HR 97 | Resp 14

## 2015-03-11 DIAGNOSIS — I890 Lymphedema, not elsewhere classified: Secondary | ICD-10-CM | POA: Diagnosis not present

## 2015-03-11 DIAGNOSIS — M47816 Spondylosis without myelopathy or radiculopathy, lumbar region: Secondary | ICD-10-CM | POA: Insufficient documentation

## 2015-03-11 DIAGNOSIS — G620 Drug-induced polyneuropathy: Secondary | ICD-10-CM | POA: Insufficient documentation

## 2015-03-11 DIAGNOSIS — M545 Low back pain: Secondary | ICD-10-CM | POA: Insufficient documentation

## 2015-03-11 DIAGNOSIS — M5416 Radiculopathy, lumbar region: Secondary | ICD-10-CM | POA: Diagnosis not present

## 2015-03-11 DIAGNOSIS — G8929 Other chronic pain: Secondary | ICD-10-CM | POA: Diagnosis not present

## 2015-03-11 DIAGNOSIS — Z9221 Personal history of antineoplastic chemotherapy: Secondary | ICD-10-CM | POA: Insufficient documentation

## 2015-03-11 DIAGNOSIS — Z853 Personal history of malignant neoplasm of breast: Secondary | ICD-10-CM | POA: Diagnosis not present

## 2015-03-11 MED ORDER — PREGABALIN 100 MG PO CAPS: 100.0000 mg | ORAL_CAPSULE | Freq: Three times a day (TID) | ORAL | Status: DC

## 2015-03-11 MED ORDER — OXYCODONE-ACETAMINOPHEN 10-325 MG PO TABS: 1.0000 | ORAL_TABLET | Freq: Three times a day (TID) | ORAL | Status: DC | PRN

## 2015-03-11 MED ORDER — FENTANYL 12 MCG/HR TD PT72: 12.5000 ug | MEDICATED_PATCH | TRANSDERMAL | Status: DC

## 2015-03-11 NOTE — Progress Notes (Signed)
Subjective:    Patient ID: Krista Doyle, female    DOB: 06/25/1960, 55 y.o.   MRN: [id number]  HPI   Krista Doyle is here in follow up of her chronic pain. I first saw her about a month ago for an initial evalutaion. She has had some relief with the percocet but it's only short lived. She hasn't experienced much change with thel lyrica or lidoderm patches to this point.   She had several questions about a DEXA scan which was done which revealed osteopenia. Treatment has been recommended by the provider.  She states that the MRI in question which we discussed at last visit has actually been done. A copy was never sent over to this office.    Pain Inventory Average Pain 8 Pain Right Now 8 My pain is sharp  In the last 24 hours, has pain interfered with the following? General activity 9 Relation with others 9 Enjoyment of life 9 What TIME of day is your pain at its worst? evening Sleep (in general) Poor  Pain is worse with: walking, bending, sitting and standing Pain improves with: medication Relief from Meds: 6  Mobility walk with assistance use a cane use a walker do you drive?  yes  Function disabled: date disabled .  Neuro/Psych numbness tingling  Prior Studies Any changes since last visit?  no bone scan  Physicians involved in your care Any changes since last visit?  no   History reviewed. No pertinent family history. Social History   Social History  . Marital Status: Married    Spouse Name: N/A  . Number of Children: N/A  . Years of Education: N/A   Social History Main Topics  . Smoking status: Never Smoker   . Smokeless tobacco: None  . Alcohol Use: No  . Drug Use: None  . Sexual Activity: Yes   Other Topics Concern  . None   Social History Narrative   History reviewed. No pertinent past surgical history. Past Medical History  Diagnosis Date  . Breast cancer ([place])     left   BP 136/83 mmHg  Pulse 97  Resp 14  SpO2  98%  Opioid Risk Score:   Fall Risk Score:  `1  Depression screen PHQ 2/9  Depression screen PHQ 2/9 02/11/2015  Decreased Interest 1  Down, Depressed, Hopeless 0  PHQ - 2 Score 1  Altered sleeping 3  Tired, decreased energy 0  Change in appetite 0  Feeling bad or failure about yourself  0  Trouble concentrating 0  Moving slowly or fidgety/restless 0  Suicidal thoughts 0  PHQ-9 Score 4  Difficult doing work/chores Somewhat difficult     Review of Systems  All other systems reviewed and are negative.      Objective:   Physical Exam  General: Alert and oriented x 3, No apparent distress. Well dressed HEENT: Head is normocephalic, atraumatic, PERRLA, EOMI, sclera anicteric, oral mucosa pink and moist, dentition intact, ext ear canals clear,  Neck: Supple without JVD or lymphadenopathy  Heart: Reg rate and rhythm. No murmurs rubs or gallops  Chest: CTA bilaterally without wheezes, rales, or rhonchi; no distress  Abdomen: Soft, non-tender, non-distended, bowel sounds positive.  Extremities: No clubbing, cyanosis, or edema. Pulses are 2+  Skin: Clean and intact without signs of breakdown  Neuro: Pt is cognitively appropriate with normal insight, memory, and awareness. Cranial nerves 2-12 are intact. Sensory exam is diminished to LT in the finger tips as well as the distal  half of both feet]. Reflexes are 1+ to absent in all 4's. Fine motor coordination is impaired due to pain,weakness, sensory loss. No tremors. Motor function is grossly inconsistent and 3-4/5 in the LE and 4-5/5 in the uppers. Gait is slightly wide based. She did not lose balance. She has a cane for balance Musculoskeletal: she is very limited with all planes of lumbar ROM---she has perhaps 20 lumbar flexion, 5 deg extension, 10 degrees  Psych: Pt's affect is appropriate. Pt is cooperative and pleasant  Assessment & Plan:   1. 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 Second hand report of lumbar xray images was fairly unremarkable  -need MRI results===need to review study  -continue percocet 10/325- two to three tabs daily prn #75 -add fentanyl patch 60mcg for more continuous pain relief, #10 -consider other intervention/therapy based on MRI and previous interventions.  2. Chemotherapy induced peripheral neuropathy  -lyrica, titrate to 100mg  TID, observing close for worsening edema  3. Breast Cancer in remission.  Thirty minutes of face to face patient care time were spent during this visit. All questions were encouraged and answered. We'll see her back in about a month.

## 2015-03-11 NOTE — Patient Instructions (Signed)
INCREASE YOUR WALKING AND EXERCISE AS YOU CAN!   PLEASE CALL ME WITH ANY PROBLEMS OR QUESTIONS [id number]).

## 2015-03-13 ENCOUNTER — Encounter (HOSPITAL_COMMUNITY): Payer: BLUE CROSS/BLUE SHIELD

## 2015-03-13 ENCOUNTER — Encounter (HOSPITAL_COMMUNITY): Payer: BLUE CROSS/BLUE SHIELD | Attending: Hematology & Oncology

## 2015-03-13 ENCOUNTER — Encounter (HOSPITAL_BASED_OUTPATIENT_CLINIC_OR_DEPARTMENT_OTHER): Payer: BLUE CROSS/BLUE SHIELD

## 2015-03-13 DIAGNOSIS — Z79899 Other long term (current) drug therapy: Secondary | ICD-10-CM | POA: Diagnosis not present

## 2015-03-13 DIAGNOSIS — Z139 Encounter for screening, unspecified: Secondary | ICD-10-CM | POA: Diagnosis not present

## 2015-03-13 DIAGNOSIS — M8580 Other specified disorders of bone density and structure, unspecified site: Secondary | ICD-10-CM | POA: Insufficient documentation

## 2015-03-13 DIAGNOSIS — Z780 Asymptomatic menopausal state: Secondary | ICD-10-CM | POA: Diagnosis not present

## 2015-03-13 DIAGNOSIS — C50412 Malignant neoplasm of upper-outer quadrant of left female breast: Secondary | ICD-10-CM | POA: Diagnosis not present

## 2015-03-13 LAB — COMPREHENSIVE METABOLIC PANEL
ALT: 17 U/L (ref 14–54)
AST: 20 U/L (ref 15–41)
Albumin: 3.6 g/dL (ref 3.5–5.0)
Alkaline Phosphatase: 53 U/L (ref 38–126)
Anion gap: 9 (ref 5–15)
BUN: 17 mg/dL (ref 6–20)
CO2: 30 mmol/L (ref 22–32)
Calcium: 9.4 mg/dL (ref 8.9–10.3)
Chloride: 101 mmol/L (ref 101–111)
Creatinine, Ser: 0.88 mg/dL (ref 0.44–1.00)
GFR calc Af Amer: >60 mL/min (ref >60)
GFR calc non Af Amer: >60 mL/min (ref >60)
Glucose, Bld: 115 mg/dL — ABNORMAL HIGH (ref 65–99)
Potassium: 3.8 mmol/L (ref 3.5–5.1)
Sodium: 140 mmol/L (ref 135–145)
Total Bilirubin: 0.4 mg/dL (ref 0.3–1.2)
Total Protein: 7.5 g/dL (ref 6.5–8.1)

## 2015-03-13 MED ORDER — SODIUM CHLORIDE 0.9 % IV SOLN: Freq: Once | INTRAVENOUS | Status: DC

## 2015-03-13 MED ORDER — DENOSUMAB 60 MG/ML ~~LOC~~ SOLN
60.0000 mg | Freq: Once | SUBCUTANEOUS | Status: AC
  Administered 2015-03-13: 60 mg via SUBCUTANEOUS
  Filled 2015-03-13: qty 1

## 2015-03-13 NOTE — Progress Notes (Signed)
Krista Doyle presents today for injection per MD orders. Prolia 60mg  administered SQ in right Upper Arm.  Patient refused administration in abdomen as recommended due to her insulin administration.   Administration without incident. Patient tolerated well.

## 2015-03-13 NOTE — Patient Instructions (Signed)
[place] at [facility] Discharge Instructions  RECOMMENDATIONS MADE BY THE CONSULTANT AND ANY TEST RESULTS WILL BE SENT TO YOUR REFERRING PHYSICIAN.  Prolia today.  Please return as scheduled.    Thank you for choosing [place] at [facility] to provide your oncology and hematology care.  To afford each patient quality time with our provider, please arrive at least 15 minutes before your scheduled appointment time.   Beginning January 23rd 2017 lab work for the [facility] will be done in the  Main lab at [facility] on 1st floor. If you have a lab appointment with the [place] please come in thru the  Main Entrance and check in at the main information desk  You need to re-schedule your appointment should you arrive 10 or more minutes late.  We strive to give you quality time with our providers, and arriving late affects you and other patients whose appointments are after yours.  Also, if you no show three or more times for appointments you may be dismissed from the clinic at the providers discretion.     Again, thank you for choosing [facility].  Our hope is that these requests will decrease the amount of time that you wait before being seen by our physicians.       _____________________________________________________________  Should you have questions after your visit to [facility], please contact our office at [phone] between the hours of 8:30 a.m. and 4:30 p.m.  Voicemails left after 4:30 p.m. will not be returned until the following business day.  For prescription refill requests, have your pharmacy contact our office.

## 2015-03-16 ENCOUNTER — Ambulatory Visit (HOSPITAL_COMMUNITY): Payer: BLUE CROSS/BLUE SHIELD

## 2015-03-17 ENCOUNTER — Encounter: Payer: Self-pay | Admitting: Genetic Counselor

## 2015-03-17 DIAGNOSIS — Z1379 Encounter for other screening for genetic and chromosomal anomalies: Secondary | ICD-10-CM | POA: Insufficient documentation

## 2015-03-18 ENCOUNTER — Ambulatory Visit: Payer: Self-pay | Admitting: Genetic Counselor

## 2015-03-18 DIAGNOSIS — Z1379 Encounter for other screening for genetic and chromosomal anomalies: Secondary | ICD-10-CM

## 2015-03-18 DIAGNOSIS — C50412 Malignant neoplasm of upper-outer quadrant of left female breast: Secondary | ICD-10-CM

## 2015-03-18 NOTE — Progress Notes (Signed)
HPI: Ms. Debruler was previously seen in the [place] clinic due to a personal history of cancer and concerns regarding a hereditary predisposition to cancer. Please refer to our prior cancer genetics clinic note for more information regarding Ms. Carter-Lasky's medical, social and family histories, and our assessment and recommendations, at the time. Ms. Farina recent genetic test results were disclosed to her, as were recommendations warranted by these results. These results and recommendations are discussed in more detail below.  FAMILY HISTORY:  We obtained a detailed, 4-generation family history.  Significant diagnoses are listed below: No family history on file.  The patient is the only person in her family with cancer.  The patient has several siblings who are cancer free, and her parents did not have cancer.  Both parents are only children.  Patient's maternal ancestors are of [place] American descent, and paternal ancestors are of African [place] descent. There is no reported Ashkenazi Jewish ancestry. There is no known consanguinity.  GENETIC TEST RESULTS: At the time of Ms. Carter-Orzel's visit, we recommended she pursue genetic testing of the Breast/Ovarian cancer gene panel. The Breast/Ovarian gene panel offered by [facility] includes sequencing and rearrangement analysis for the following 20 genes:  ATM, BARD1, BRCA1, BRCA2, BRIP1, CDH1, CHEK2, EPCAM, FANCC, MLH1, MSH2, MSH6, NBN, PALB2, PMS2, PTEN, RAD51C, RAD51D, TP53, and XRCC2.   The report date is March 16, 2015.  Genetic testing was normal, and did not reveal a deleterious mutation in these genes. The test report has been scanned into EPIC and is located under the Molecular Pathology section of the Results Review tab.   We discussed with Ms. Carter-Panico that since the current genetic testing is not perfect, it is possible there may be a gene mutation in one of these genes that current testing cannot detect,  but that chance is small. We also discussed, that it is possible that another gene that has not yet been discovered, or that we have not yet tested, is responsible for the cancer diagnoses in the family, and it is, therefore, important to remain in touch with cancer genetics in the future so that we can continue to offer Ms. Carter-Stauber the most up to date genetic testing.   CANCER SCREENING RECOMMENDATIONS: This result is reassuring and indicates that Ms. Carter-Vane likely does not have an increased risk for a future cancer due to a mutation in one of these genes. This normal test also suggests that Ms. Carter-Choy's cancer was most likely not due to an inherited predisposition associated with one of these genes.  Most cancers happen by chance and this negative test suggests that her cancer falls into this category.  We, therefore, recommended she continue to follow the cancer management and screening guidelines provided by her oncology and primary healthcare provider.   RECOMMENDATIONS FOR FAMILY MEMBERS: Women in this family might be at some increased risk of developing cancer, over the general population risk, simply due to the family history of cancer. We recommended women in this family have a yearly mammogram beginning at age 86, or 67 years younger than the earliest onset of cancer, an an annual clinical breast exam, and perform monthly breast self-exams. Women in this family should also have a gynecological exam as recommended by their primary provider. All family members should have a colonoscopy by age 35.  FOLLOW-UP: Lastly, we discussed with Ms. Carter-Catania that cancer genetics is a rapidly advancing field and it is possible that new genetic tests will be appropriate for her  and/or her family members in the future. We encouraged her to remain in contact with cancer genetics on an annual basis so we can update her personal and family histories and let her know of advances in cancer genetics  that may benefit this family.   Our contact number was provided. Ms. Deacon questions were answered to her satisfaction, and she knows she is welcome to call us at anytime with additional questions or concerns.   Roma Kayser, MS, [facility] Certified Genetic Counselor [email or web address]

## 2015-03-27 DIAGNOSIS — J4521 Mild intermittent asthma with (acute) exacerbation: Secondary | ICD-10-CM | POA: Diagnosis not present

## 2015-03-27 DIAGNOSIS — E1165 Type 2 diabetes mellitus with hyperglycemia: Secondary | ICD-10-CM | POA: Diagnosis not present

## 2015-04-06 ENCOUNTER — Other Ambulatory Visit (HOSPITAL_COMMUNITY): Payer: Self-pay | Admitting: Internal Medicine

## 2015-04-06 ENCOUNTER — Encounter: Payer: BLUE CROSS/BLUE SHIELD | Admitting: Physical Medicine & Rehabilitation

## 2015-04-06 ENCOUNTER — Telehealth: Payer: Self-pay | Admitting: *Deleted

## 2015-04-06 DIAGNOSIS — R1112 Projectile vomiting: Secondary | ICD-10-CM

## 2015-04-06 DIAGNOSIS — J4521 Mild intermittent asthma with (acute) exacerbation: Secondary | ICD-10-CM | POA: Diagnosis not present

## 2015-04-06 DIAGNOSIS — R062 Wheezing: Secondary | ICD-10-CM

## 2015-04-06 DIAGNOSIS — R52 Pain, unspecified: Secondary | ICD-10-CM

## 2015-04-06 DIAGNOSIS — R1010 Upper abdominal pain, unspecified: Secondary | ICD-10-CM | POA: Diagnosis not present

## 2015-04-06 MED ORDER — OXYCODONE-ACETAMINOPHEN 10-325 MG PO TABS: 1.0000 | ORAL_TABLET | Freq: Three times a day (TID) | ORAL | Status: DC | PRN

## 2015-04-06 MED ORDER — FENTANYL 12 MCG/HR TD PT72: 12.5000 ug | MEDICATED_PATCH | TRANSDERMAL | Status: DC

## 2015-04-06 NOTE — Telephone Encounter (Signed)
Patient called and rescheduled her appt with Dr. Naaman Plummer from 04/06/2015 to 04/28/2015 due to illness.  She is going to run out of her medications....Marland KitchenMarland Kitchenplease advise

## 2015-04-06 NOTE — Telephone Encounter (Signed)
Spoke with pt, medication up front to be picked up.

## 2015-04-06 NOTE — Telephone Encounter (Signed)
Can fill meds this time only without appt

## 2015-04-07 ENCOUNTER — Ambulatory Visit (HOSPITAL_COMMUNITY)
Admission: RE | Admit: 2015-04-07 | Discharge: 2015-04-07 | Disposition: A | Discharge status: Home / Self Care | Payer: BLUE CROSS/BLUE SHIELD | Source: Ambulatory Visit | Attending: Internal Medicine | Admitting: Internal Medicine

## 2015-04-07 DIAGNOSIS — R109 Unspecified abdominal pain: Secondary | ICD-10-CM | POA: Diagnosis present

## 2015-04-07 DIAGNOSIS — R112 Nausea with vomiting, unspecified: Secondary | ICD-10-CM | POA: Insufficient documentation

## 2015-04-07 DIAGNOSIS — R52 Pain, unspecified: Secondary | ICD-10-CM

## 2015-04-07 DIAGNOSIS — R1112 Projectile vomiting: Secondary | ICD-10-CM

## 2015-04-07 DIAGNOSIS — R05 Cough: Secondary | ICD-10-CM | POA: Diagnosis not present

## 2015-04-07 DIAGNOSIS — R062 Wheezing: Secondary | ICD-10-CM

## 2015-04-07 NOTE — Telephone Encounter (Signed)
Lidocaine Patches approved by insurance 11/21/14-02/19/2016

## 2015-04-08 ENCOUNTER — Encounter: Payer: Self-pay | Admitting: Gastroenterology

## 2015-04-22 ENCOUNTER — Ambulatory Visit: Payer: BLUE CROSS/BLUE SHIELD | Admitting: Nurse Practitioner

## 2015-04-28 ENCOUNTER — Encounter: Payer: Self-pay | Admitting: Physical Medicine & Rehabilitation

## 2015-04-28 ENCOUNTER — Encounter
Payer: BLUE CROSS/BLUE SHIELD | Attending: Physical Medicine & Rehabilitation | Admitting: Physical Medicine & Rehabilitation

## 2015-04-28 DIAGNOSIS — Z9221 Personal history of antineoplastic chemotherapy: Secondary | ICD-10-CM | POA: Insufficient documentation

## 2015-04-28 DIAGNOSIS — M545 Low back pain: Secondary | ICD-10-CM | POA: Insufficient documentation

## 2015-04-28 DIAGNOSIS — T451X5A Adverse effect of antineoplastic and immunosuppressive drugs, initial encounter: Secondary | ICD-10-CM | POA: Diagnosis not present

## 2015-04-28 DIAGNOSIS — I890 Lymphedema, not elsewhere classified: Secondary | ICD-10-CM | POA: Diagnosis not present

## 2015-04-28 DIAGNOSIS — M47816 Spondylosis without myelopathy or radiculopathy, lumbar region: Secondary | ICD-10-CM | POA: Diagnosis not present

## 2015-04-28 DIAGNOSIS — M5416 Radiculopathy, lumbar region: Secondary | ICD-10-CM

## 2015-04-28 DIAGNOSIS — G620 Drug-induced polyneuropathy: Secondary | ICD-10-CM | POA: Diagnosis not present

## 2015-04-28 DIAGNOSIS — Z853 Personal history of malignant neoplasm of breast: Secondary | ICD-10-CM | POA: Insufficient documentation

## 2015-04-28 DIAGNOSIS — G8929 Other chronic pain: Secondary | ICD-10-CM | POA: Diagnosis not present

## 2015-04-28 MED ORDER — OXYCODONE-ACETAMINOPHEN 10-325 MG PO TABS: 1.0000 | ORAL_TABLET | Freq: Three times a day (TID) | ORAL | Status: DC | PRN

## 2015-04-28 MED ORDER — FENTANYL 12 MCG/HR TD PT72: 12.5000 ug | MEDICATED_PATCH | TRANSDERMAL | Status: DC

## 2015-04-28 NOTE — Progress Notes (Signed)
Subjective:    Patient ID: Krista Doyle, female    DOB: 1961/01/09, 55 y.o.   MRN: [id number]  HPI  Krista Doyle is here regarding her chronic pain. The fentanyl patch was helpful. She is using every  72 hours with there percocet for breakthrough pain. The increase in lyrica has helped her leg pain to the point where it's quite tolerable.   I have not received imaging/procedures from ortho despite requesting.   She is trying to stay active as possible. She uses her cane for balance. She denies any falls.   Pain Inventory Average Pain 5 Pain Right Now 5 My pain is sharp, stabbing and tingling  In the last 24 hours, has pain interfered with the following? General activity 6 Relation with others 6 Enjoyment of life 6 What TIME of day is your pain at its worst? night Sleep (in general) Poor  Pain is worse with: walking, bending, sitting and standing Pain improves with: other Relief from Meds: 5  Mobility walk with assistance use a cane use a walker ability to climb steps?  yes do you drive?  yes  Function disabled: date disabled 2010  Neuro/Psych No problems in this area  Prior Studies Any changes since last visit?  no  Physicians involved in your care Any changes since last visit?  no   History reviewed. No pertinent family history. Social History   Social History  . Marital Status: Married    Spouse Name: N/A  . Number of Children: N/A  . Years of Education: N/A   Social History Main Topics  . Smoking status: Never Smoker   . Smokeless tobacco: None  . Alcohol Use: No  . Drug Use: None  . Sexual Activity: Yes   Other Topics Concern  . None   Social History Narrative   History reviewed. No pertinent past surgical history. Past Medical History  Diagnosis Date  . Breast cancer ([place])     left   There were no vitals taken for this visit.  Opioid Risk Score:   Fall Risk Score:  `1  Depression screen PHQ 2/9  Depression screen [facility] 2/9  04/28/2015 02/11/2015  Decreased Interest 1 1  Down, Depressed, Hopeless 0 0  PHQ - 2 Score 1 1  Altered sleeping - 3  Tired, decreased energy - 0  Change in appetite - 0  Feeling bad or failure about yourself  - 0  Trouble concentrating - 0  Moving slowly or fidgety/restless - 0  Suicidal thoughts - 0  PHQ-9 Score - 4  Difficult doing work/chores - Somewhat difficult    Review of Systems  Respiratory: Positive for wheezing.   Cardiovascular: Positive for leg swelling.  Gastrointestinal: Positive for constipation.  All other systems reviewed and are negative.      Objective:   Physical Exam  General: Alert and oriented x 3, No apparent distress. Well dressed  HEENT: Head is normocephalic, atraumatic, PERRLA, EOMI, sclera anicteric, oral mucosa pink and moist, dentition intact, ext ear canals clear,  Neck: Supple without JVD or lymphadenopathy  Heart: Reg rate and rhythm. No murmurs rubs or gallops  Chest: CTA bilaterally without wheezes, rales, or rhonchi; no distress  Abdomen: Soft, non-tender, non-distended, bowel sounds positive.  Extremities: No clubbing, cyanosis, or edema. Pulses are 2+  Skin: Clean and intact without signs of breakdown  Neuro: Pt is cognitively appropriate with normal insight, memory, and awareness. Cranial nerves 2-12 are intact. Sensory exam is diminished to LT in the  finger tips as well as the distal half of both feet]. Reflexes are 1+ to absent in all 4's. Fine motor coordination is impaired due to pain,weakness, sensory loss. No tremors. Motor function is grossly inconsistent and 3-4/5 in the LE and 4-5/5 in the uppers. Gait is slightly wide based. She has a cane for balance  Musculoskeletal: she remains very limited with all planes of lumbar ROM---she has perhaps 20 lumbar flexion, 5 deg extension, 10 degrees  Psych: Pt's affect is appropriate. Pt is cooperative and pleasant    Assessment & Plan:   1. 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 Second hand report of lumbar xray images was fairly unremarkable  -need MRI results===need to review study---still have not gotten  -continue percocet 10/325- two to three tabs daily prn #75  -continue fentanyl patch 42mcg for more continuous pain relief, #10  -consider other intervention/therapy based on MRI and previous interventions  -probably would do well with a focused course of therapy ([facility]) 2. Chemotherapy induced peripheral neuropathy  -lyrica at 100mg  TID has been effective  3. Breast Cancer in remission.   Thirty minutes of face to face patient care time were spent during this visit. All questions were encouraged and answered. We'll see her back in about a month.

## 2015-04-28 NOTE — Patient Instructions (Signed)
  PLEASE CALL ME WITH ANY PROBLEMS OR QUESTIONS (#[phone]).      

## 2015-05-06 ENCOUNTER — Ambulatory Visit: Payer: BLUE CROSS/BLUE SHIELD | Admitting: Nurse Practitioner

## 2015-05-08 ENCOUNTER — Ambulatory Visit (INDEPENDENT_AMBULATORY_CARE_PROVIDER_SITE_OTHER): Payer: BLUE CROSS/BLUE SHIELD | Admitting: Gastroenterology

## 2015-05-08 ENCOUNTER — Other Ambulatory Visit: Payer: Self-pay

## 2015-05-08 ENCOUNTER — Encounter: Payer: Self-pay | Admitting: Gastroenterology

## 2015-05-08 ENCOUNTER — Other Ambulatory Visit: Payer: Self-pay | Admitting: Gastroenterology

## 2015-05-08 VITALS — BP 123/77 | HR 86 | Temp 98.2°F | Ht 64.0 in | Wt 148.8 lb

## 2015-05-08 DIAGNOSIS — T402X5A Adverse effect of other opioids, initial encounter: Secondary | ICD-10-CM | POA: Diagnosis not present

## 2015-05-08 DIAGNOSIS — K839 Disease of biliary tract, unspecified: Secondary | ICD-10-CM

## 2015-05-08 DIAGNOSIS — K5903 Drug induced constipation: Secondary | ICD-10-CM | POA: Insufficient documentation

## 2015-05-08 DIAGNOSIS — K838 Other specified diseases of biliary tract: Secondary | ICD-10-CM

## 2015-05-08 NOTE — Progress Notes (Addendum)
REVIEWED-NO ADDITIONAL RECOMMENDATIONS.  Primary Care Physician:  [name], MD Primary Gastroenterologist:  Dr. Oneida Alar   Chief Complaint  Patient presents with  . Abdominal Pain  . Nausea  . Constipation    HPI:   Krista Doyle is a 55 y.o. female presenting today at the request of Dr. Legrand Rams secondary to dilated CBD on recent ultrasound.  Had episode of N/V, abdominal pain in upper abdomen for at least 2 weeks but this resolved. US abdomen March 2017 showed: upper normal gallbladder wall thickness without sonographic Murphy's sign, dilatation of CBD at 43mm of uncertain etiology. She states she had lab work around this time, which I have requested. Prior LFTs in Feb 2017 normal.   Notes constipation. New onset since March 2017. Normally would go every other day. Hard stool in past. No rectal bleeding. Last colonoscopy about 3 years ago in [employer]. She believes she had benign polyps. Will request those records. No further upper GI symptoms.   Past Medical History  Diagnosis Date  . Breast cancer ([place]) 2008    left  . Back pain   . Neuropathy ([place])     extremities after chemo  . Diabetes ([place])   . Hypercholesterolemia   . Asthma     Past Surgical History  Procedure Laterality Date  . Breast lumpectomy    . Tonsillectomy    . Knee surgery      right  . Back surgery    . Colonoscopy      about 3 yrs ago in [place]    Current Outpatient Prescriptions  Medication Sig Dispense Refill  . albuterol (PROVENTIL HFA;VENTOLIN HFA) 108 (90 Base) MCG/ACT inhaler Inhale 1 puff into the lungs every 6 (six) hours as needed for wheezing or shortness of breath.    Marland Kitchen atorvastatin (LIPITOR) 20 MG tablet Take 20 mg by mouth daily.    . budesonide-formoterol (SYMBICORT) 160-4.5 MCG/ACT inhaler Inhale 2 puffs into the lungs 2 (two) times daily.    . calcium carbonate (OS-CAL - DOSED IN MG OF ELEMENTAL CALCIUM) 1250 (500 Ca) MG tablet Take 1 tablet by mouth daily with  breakfast.    . ergocalciferol (VITAMIN D2) 50000 UNITS capsule Take 1 capsule (50,000 Units total) by mouth once a week. 4 capsule 5  . fentaNYL (DURAGESIC - DOSED MCG/HR) 12 MCG/HR Place 1 patch (12.5 mcg total) onto the skin every 3 (three) days. 10 patch 0  . insulin glargine (LANTUS) 100 UNIT/ML injection Inject 24 Units into the skin at bedtime.    Marland Kitchen letrozole (FEMARA) 2.5 MG tablet Take 2.5 mg by mouth daily.    Marland Kitchen loratadine (CLARITIN) 10 MG tablet Take 10 mg by mouth daily as needed for allergies.    Marland Kitchen oxyCODONE-acetaminophen (PERCOCET) 10-325 MG tablet Take 1 tablet by mouth every 8 (eight) hours as needed for pain. 90 tablet 0  . pregabalin (LYRICA) 100 MG capsule Take 1 capsule (100 mg total) by mouth 3 (three) times daily. 90 capsule 3  . sitaGLIPtin (JANUVIA) 100 MG tablet Take 100 mg by mouth daily.    . valsartan (DIOVAN) 160 MG tablet Take 160 mg by mouth daily.     No current facility-administered medications for this visit.   Facility-Administered Medications Ordered in Other Visits  Medication Dose Route Frequency Provider Last Rate Last Dose  . 0.9 %  sodium chloride infusion   Intravenous Once Patrici Ranks, MD        Allergies as of 05/08/2015  . (  No Known Allergies)    Family History  Problem Relation Age of Onset  . Colon cancer Neg Hx     Social History   Social History  . Marital Status: Married    Spouse Name: N/A  . Number of Children: N/A  . Years of Education: N/A   Occupational History  . Disabled    Social History Main Topics  . Smoking status: Never Smoker   . Smokeless tobacco: Not on file  . Alcohol Use: No  . Drug Use: No  . Sexual Activity: Yes   Other Topics Concern  . Not on file   Social History Narrative    Review of Systems: As mentioned in HPI.   Physical Exam: BP 123/77 mmHg  Pulse 86  Temp(Src) 98.2 F (36.8 C) (Oral)  Ht 5\' 4"  (1.626 m)  Wt 148 lb 12.8 oz (67.495 kg)  BMI 25.53 kg/m2 General:   Alert and  oriented. Pleasant and cooperative. Well-nourished and well-developed.  Head:  Normocephalic and atraumatic. Eyes:  Without icterus, sclera clear and conjunctiva pink.  Ears:  Normal auditory acuity. Nose:  No deformity, discharge,  or lesions. Mouth:  No deformity or lesions, oral mucosa pink.  Lungs:  Clear to auscultation bilaterally. No wheezes, rales, or rhonchi. No distress.  Heart:  S1, S2 present without murmurs appreciated.  Abdomen:  +BS, soft, very mild discomfort upper abdomen/epigastric with palpation and non-distended. No HSM noted. No guarding or rebound. No masses appreciated.  Rectal:  Deferred  Msk:  Symmetrical without gross deformities. Shuffling gait, uses cane for assistance Extremities:  Without  edema. Neurologic:  Alert and  oriented x4;  grossly normal neurologically. Psych:  Alert and cooperative. Normal mood and affect.

## 2015-05-08 NOTE — Patient Instructions (Signed)
We have scheduled you for an MRI to further evaluate the common bile duct.   Start taking Movantik 25 mg each morning an hour before breakfast. I have provided samples. If you like this, I will send a prescription.  Further recommendations after we review the MRI.  Congratulations on the wedding celebration upcoming!

## 2015-05-08 NOTE — Assessment & Plan Note (Signed)
No obvious mass or filling defect on [place]. Will proceed with MRI/MRCP to be thorough and obtain most recent labs, which patient states were done recently. Upper GI symptoms completely resolved. Question secondary to viral illness.

## 2015-05-08 NOTE — Assessment & Plan Note (Signed)
Has done well with Movantik historically. Will restart now. No concerning lower GI symptoms. Last colonoscopy in [place] approximately 3 years ago. Will request.

## 2015-05-11 NOTE — Progress Notes (Signed)
cc'ed to pcp °

## 2015-05-15 ENCOUNTER — Ambulatory Visit (HOSPITAL_COMMUNITY): Payer: BLUE CROSS/BLUE SHIELD

## 2015-05-20 ENCOUNTER — Ambulatory Visit (HOSPITAL_COMMUNITY)
Admission: RE | Admit: 2015-05-20 | Discharge: 2015-05-20 | Disposition: A | Discharge status: Home / Self Care | Payer: BLUE CROSS/BLUE SHIELD | Source: Ambulatory Visit | Attending: Gastroenterology | Admitting: Gastroenterology

## 2015-05-20 DIAGNOSIS — K838 Other specified diseases of biliary tract: Secondary | ICD-10-CM | POA: Insufficient documentation

## 2015-05-20 DIAGNOSIS — R935 Abnormal findings on diagnostic imaging of other abdominal regions, including retroperitoneum: Secondary | ICD-10-CM | POA: Diagnosis not present

## 2015-05-20 LAB — POCT I-STAT CREATININE: Creatinine, Ser: 0.8 mg/dL (ref 0.44–1.00)

## 2015-05-20 MED ORDER — GADOBENATE DIMEGLUMINE 529 MG/ML IV SOLN
15.0000 mL | Freq: Once | INTRAVENOUS | Status: AC | PRN
  Administered 2015-05-20: 13 mL via INTRAVENOUS

## 2015-05-26 ENCOUNTER — Encounter: Payer: Medicare Other | Admitting: Physical Medicine & Rehabilitation

## 2015-06-02 ENCOUNTER — Encounter: Payer: Self-pay | Admitting: Physical Medicine & Rehabilitation

## 2015-06-02 ENCOUNTER — Telehealth: Payer: Self-pay

## 2015-06-02 ENCOUNTER — Encounter
Payer: BLUE CROSS/BLUE SHIELD | Attending: Physical Medicine & Rehabilitation | Admitting: Physical Medicine & Rehabilitation

## 2015-06-02 VITALS — BP 129/86 | HR 78

## 2015-06-02 DIAGNOSIS — G620 Drug-induced polyneuropathy: Secondary | ICD-10-CM | POA: Insufficient documentation

## 2015-06-02 DIAGNOSIS — M47816 Spondylosis without myelopathy or radiculopathy, lumbar region: Secondary | ICD-10-CM | POA: Diagnosis not present

## 2015-06-02 DIAGNOSIS — M5416 Radiculopathy, lumbar region: Secondary | ICD-10-CM

## 2015-06-02 DIAGNOSIS — Z9221 Personal history of antineoplastic chemotherapy: Secondary | ICD-10-CM | POA: Insufficient documentation

## 2015-06-02 DIAGNOSIS — T451X5A Adverse effect of antineoplastic and immunosuppressive drugs, initial encounter: Secondary | ICD-10-CM

## 2015-06-02 DIAGNOSIS — Z79899 Other long term (current) drug therapy: Secondary | ICD-10-CM

## 2015-06-02 DIAGNOSIS — G8929 Other chronic pain: Secondary | ICD-10-CM | POA: Insufficient documentation

## 2015-06-02 DIAGNOSIS — J4520 Mild intermittent asthma, uncomplicated: Secondary | ICD-10-CM | POA: Diagnosis not present

## 2015-06-02 DIAGNOSIS — I890 Lymphedema, not elsewhere classified: Secondary | ICD-10-CM | POA: Insufficient documentation

## 2015-06-02 DIAGNOSIS — Z853 Personal history of malignant neoplasm of breast: Secondary | ICD-10-CM | POA: Insufficient documentation

## 2015-06-02 DIAGNOSIS — Z5181 Encounter for therapeutic drug level monitoring: Secondary | ICD-10-CM

## 2015-06-02 DIAGNOSIS — E119 Type 2 diabetes mellitus without complications: Secondary | ICD-10-CM | POA: Diagnosis not present

## 2015-06-02 DIAGNOSIS — M545 Low back pain: Secondary | ICD-10-CM | POA: Insufficient documentation

## 2015-06-02 DIAGNOSIS — I10 Essential (primary) hypertension: Secondary | ICD-10-CM | POA: Diagnosis not present

## 2015-06-02 DIAGNOSIS — G629 Polyneuropathy, unspecified: Secondary | ICD-10-CM | POA: Diagnosis not present

## 2015-06-02 MED ORDER — PREGABALIN 150 MG PO CAPS: 150.0000 mg | ORAL_CAPSULE | Freq: Two times a day (BID) | ORAL | Status: DC

## 2015-06-02 MED ORDER — FENTANYL 12 MCG/HR TD PT72: 12.5000 ug | MEDICATED_PATCH | TRANSDERMAL | Status: DC

## 2015-06-02 MED ORDER — NALOXEGOL OXALATE 25 MG PO TABS: 25.0000 mg | ORAL_TABLET | Freq: Every day | ORAL | Status: DC

## 2015-06-02 MED ORDER — OXYCODONE-ACETAMINOPHEN 10-325 MG PO TABS: 1.0000 | ORAL_TABLET | Freq: Three times a day (TID) | ORAL | Status: DC | PRN

## 2015-06-02 NOTE — Telephone Encounter (Signed)
Likely secondary to constipation. I am sending to her pharmacy. May also take Miralax until she is able to get Movantik. Call if worsens.

## 2015-06-02 NOTE — Telephone Encounter (Signed)
Pt called for results. She said she had been doing well on movanik and would like an rx sent to [facility]. She ran out of samples about a week ago and started having some constipation, she took a senakot two days ago and has been having abd pain ever since. Finally had a bm this morning and is feeling a little better but is still concerned about the pain she has been having.

## 2015-06-02 NOTE — Patient Instructions (Signed)
LYRICA:  100-100-150 X 5 DAYS 150-100-150 X 5 DAYS THEN  150MG  THREE X DAILY

## 2015-06-02 NOTE — Progress Notes (Signed)
Quick Note:  Everything looks good. Just need outside labs from PCP. We can have her come back in 6 months. Hope she is doing well on Movantik. ______

## 2015-06-02 NOTE — Progress Notes (Signed)
Subjective:    Patient ID: Krista Doyle, female    DOB: 1960-09-13, 55 y.o.   MRN: [id number]  HPI   Mrs. Carter-Vesey is back regarding her back and leg pain. She has ongoing back and right leg symptoms. The right leg is acting up more today. She just returned from a trip to [place] which really aggravated her symptoms. I have not received records from Dr. Laurena Bering office as far as most recent MRI and procedural reports.   The patient tolerates her lyrica well and uses the fentanyl and percocet also which help to control pain.   Her radiating pain is most severe across the anterior thigh. It really doesn't cross the knee. She uses a walker for support.    Pain Inventory Average Pain 6 Pain Right Now 6 My pain is sharp and stabbing  In the last 24 hours, has pain interfered with the following? General activity 6 Relation with others 5 Enjoyment of life 7 What TIME of day is your pain at its worst? night Sleep (in general) Poor  Pain is worse with: walking, bending, standing and some activites Pain improves with: rest and medication Relief from Meds: 7  Mobility walk with assistance use a cane use a walker ability to climb steps?  yes do you drive?  yes Do you have any goals in this area?  yes  Function disabled: date disabled 2008 I need assistance with the following:  household duties  Neuro/Psych weakness numbness tingling spasms  Prior Studies na  Physicians involved in your care Any changes since last visit?  no   Family History  Problem Relation Age of Onset  . Colon cancer Neg Hx    Social History   Social History  . Marital Status: Married    Spouse Name: N/A  . Number of Children: N/A  . Years of Education: N/A   Occupational History  . Disabled    Social History Main Topics  . Smoking status: Never Smoker   . Smokeless tobacco: None  . Alcohol Use: No  . Drug Use: No  . Sexual Activity: Yes   Other Topics Concern  . None     Social History Narrative   Past Surgical History  Procedure Laterality Date  . Breast lumpectomy    . Tonsillectomy    . Knee surgery      right  . Back surgery    . Colonoscopy      about 3 yrs ago in [place]   Past Medical History  Diagnosis Date  . Breast cancer ([place]) 2008    left  . Back pain   . Neuropathy ([place])     extremities after chemo  . Diabetes ([place])   . Hypercholesterolemia   . Asthma    BP 129/86 mmHg  Pulse 78  Opioid Risk Score:   Fall Risk Score:  `1  Depression screen PHQ 2/9  Depression screen [facility] 2/9 04/28/2015 02/11/2015  Decreased Interest 1 1  Down, Depressed, Hopeless 0 0  PHQ - 2 Score 1 1  Altered sleeping - 3  Tired, decreased energy - 0  Change in appetite - 0  Feeling bad or failure about yourself  - 0  Trouble concentrating - 0  Moving slowly or fidgety/restless - 0  Suicidal thoughts - 0  PHQ-9 Score - 4  Difficult doing work/chores - Somewhat difficult     Review of Systems     Objective:   Physical Exam  General: Alert and  oriented x 3, No apparent distress. Well dressed  HEENT: Head is normocephalic, atraumatic, PERRLA, EOMI, sclera anicteric, oral mucosa pink and moist, dentition intact, ext ear canals clear,  Neck: Supple without JVD or lymphadenopathy  Heart: Reg rate and rhythm. No murmurs rubs or gallops  Chest: CTA bilaterally without wheezes, rales, or rhonchi; no distress  Abdomen: Soft, non-tender, non-distended, bowel sounds positive.  Extremities: No clubbing, cyanosis, or edema. Pulses are 2+  Skin: Clean and intact without signs of breakdown  Neuro: Pt is cognitively appropriate with normal insight, memory, and awareness. Cranial nerves 2-12 are intact. Sensory exam is diminished to LT in the finger tips as well as the distal half of both feet]. Reflexes are 1+ to absent in all 4's. Fine motor coordination is impaired due to pain,weakness, sensory loss. No tremors. Motor function is grossly inconsistent  and 3-4/5 in the LE and 4-5/5 in the uppers. Proximal leg strength related to pain, especially Right hip. Gait is slightly wide based. She has a cane for balance  Musculoskeletal: she remains very limited with all planes of lumbar ROM---she has perhaps 20 lumbar flexion, 5 deg extension, 10 degrees  Psych: Pt's affect is appropriate. Pt is cooperative and pleasant    Assessment & Plan:   1. Chronic low back pain, lumbar spondylosis, spondylolisthesis, likely lumbar-sacral radiculopathy.  Second hand report of lumbar xray images was fairly unremarkable  -need MRI results===need to review study---still have not gotten  -continue percocet 10/325- two to three tabs daily prn #75  -continue fentanyl patch 59mcg for more continuous pain relief, #10  -consider other intervention/therapy based on MRI and previous interventions  -probably would do well with a focused course of therapy ([facility])   2. Chemotherapy induced peripheral neuropathy  -lyrica at 100mg  TID has been effective---increase to 150mg  TID to capture radicular pain 3. Breast Cancer in remission.  15 minutes of face to face patient care time were spent during this visit. All questions were encouraged and answered. We'll see her back in about a month.

## 2015-06-04 NOTE — Telephone Encounter (Signed)
Called. Many rings and no answer.  

## 2015-06-08 NOTE — Telephone Encounter (Signed)
Pt is aware.  

## 2015-06-09 LAB — TOXASSURE SELECT,+ANTIDEPR,UR

## 2015-06-09 NOTE — Progress Notes (Signed)
Urine drug screen for this encounter is consistent for prescribed medications.   

## 2015-06-10 ENCOUNTER — Encounter (HOSPITAL_COMMUNITY): Payer: Self-pay | Admitting: Hematology & Oncology

## 2015-06-10 ENCOUNTER — Encounter (HOSPITAL_COMMUNITY): Payer: BLUE CROSS/BLUE SHIELD | Attending: Hematology & Oncology | Admitting: Hematology & Oncology

## 2015-06-10 VITALS — BP 110/68 | HR 93 | Temp 98.6°F | Resp 16 | Wt 143.6 lb

## 2015-06-10 DIAGNOSIS — Z853 Personal history of malignant neoplasm of breast: Secondary | ICD-10-CM

## 2015-06-10 DIAGNOSIS — E559 Vitamin D deficiency, unspecified: Secondary | ICD-10-CM

## 2015-06-10 DIAGNOSIS — M8580 Other specified disorders of bone density and structure, unspecified site: Secondary | ICD-10-CM

## 2015-06-10 DIAGNOSIS — C50412 Malignant neoplasm of upper-outer quadrant of left female breast: Secondary | ICD-10-CM | POA: Insufficient documentation

## 2015-06-10 DIAGNOSIS — Z780 Asymptomatic menopausal state: Secondary | ICD-10-CM | POA: Insufficient documentation

## 2015-06-10 DIAGNOSIS — Z79899 Other long term (current) drug therapy: Secondary | ICD-10-CM | POA: Insufficient documentation

## 2015-06-10 DIAGNOSIS — Z139 Encounter for screening, unspecified: Secondary | ICD-10-CM | POA: Insufficient documentation

## 2015-06-10 NOTE — Patient Instructions (Signed)
[place] at [facility] Discharge Instructions  RECOMMENDATIONS MADE BY THE CONSULTANT AND ANY TEST RESULTS WILL BE SENT TO YOUR REFERRING PHYSICIAN.  Lymphedema clinic referral  Lymphedema sleeve Return in 6 months Labs and prolia as scheduled in August  Thank you for choosing [facility] at [facility] to provide your oncology and hematology care.  To afford each patient quality time with our provider, please arrive at least 15 minutes before your scheduled appointment time.   Beginning January 23rd 2017 lab work for the [facility] will be done in the  Main lab at [facility] on 1st floor. If you have a lab appointment with the [facility] please come in thru the  Main Entrance and check in at the main information desk  You need to re-schedule your appointment should you arrive 10 or more minutes late.  We strive to give you quality time with our providers, and arriving late affects you and other patients whose appointments are after yours.  Also, if you no show three or more times for appointments you may be dismissed from the clinic at the providers discretion.     Again, thank you for choosing [facility].  Our hope is that these requests will decrease the amount of time that you wait before being seen by our physicians.       _____________________________________________________________  Should you have questions after your visit to [facility], please contact our office at [phone] between the hours of 8:30 a.m. and 4:30 p.m.  Voicemails left after 4:30 p.m. will not be returned until the following business day.  For prescription refill requests, have your pharmacy contact our office.         Resources For Cancer Patients and their Caregivers ? American Cancer Society: Can assist with transportation, wigs, general needs, runs Look Good Feel Better.        (319)137-9140 ? Cancer Care: Provides  financial assistance, online support groups, medication/co-pay assistance.  1-800-813-HOPE 862-640-0988) ? [facility] Assists [place] cancer patients and their families through emotional , educational and financial support.  (214) 211-8653 ? [place] DSS Where to apply for food stamps, Medicaid and utility assistance. 254-032-9991 ? [facility]: Transportation to medical appointments. 7807262213 ? Social Security Administration: May apply for disability if have a Stage IV cancer. (725) 868-7325 817 375 8010 ? [employer], Disability and Transit Services: Assists with nutrition, care and transit needs. [place] Support Programs: @10RELATIVEDAYS @ > Cancer Support Group  2nd Tuesday of the month 1pm-2pm, Journey Room  > Creative Journey  3rd Tuesday of the month 1130am-1pm, Journey Room  > Look Good Feel Better  1st Wednesday of the month 10am-12 noon, Journey Room (Call [place] to register [phone])

## 2015-06-10 NOTE — Progress Notes (Signed)
[place] at [facility] Progress Note  Patient Care Team: [name], MD as PCP - General (Internal Medicine) Danie Binder, MD as Consulting Physician (Gastroenterology)  CHIEF COMPLAINTS/PURPOSE OF CONSULTATION:  History of L Breast Cancer  ER+, PR+, HER-2+. Completed one year of Herceptin Right breast fibroadenoma Original breast cancer diagnosis in 2008 Genetic testing negative for mutation  HISTORY OF PRESENTING ILLNESS:  Krista Doyle 55 y.o. female is here because of a history of Left breast cancer. She was diagnosed in 2008 at the age of 33. She continues on femara. She took Tamoxifen for 5 years and plans on finishing femara for 5 years.   [name] was here alone today.  She says that her hand and arm still hurt a lot. For the past 2 months her hands have been swelling. She said that normally it is just her left hand that swells but now it is both of them. She cannot put her rings on. Her left arm hurts from her shoulder to her elbow. She says that her arms and hands hurt and they feel tight. She had a lymphedema sleeve and glove in the past but needs a new one.   She has been tolerating the Fentanyl patches well. She says that it leaves a mark where the patch is and sometimes she gets a rash on her back where it is and that it itches sometimes. She notes however that PM&R has been a big help for her.   She has been taking calcium and vitamin D every day. She has been compliant with Femara.   She is up to date on her mammograms. She is up to date on DEXA. She started prolia in February.   MEDICAL HISTORY:  Past Medical History  Diagnosis Date  . Breast cancer ([place]) 2008    left  . Back pain   . 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 Diabetes ([place])   . Hypercholesterolemia   . Asthma     SURGICAL HISTORY: Past Surgical History  Procedure Laterality Date  . Breast lumpectomy    . Tonsillectomy    . Knee surgery     right  . Back surgery    . Colonoscopy      about 3 yrs ago in [place]: Social History   Social History  . Marital Status: Married    Spouse Name: N/A  . Number of Children: N/A  . Years of Education: N/A   Occupational History  . Disabled    Social History Main Topics  . Smoking status: Never Smoker   . Smokeless tobacco: Not on file  . Alcohol Use: No  . Drug Use: No  . Sexual Activity: Yes   Other Topics Concern  . Not on file   Social History Narrative  Recently married. 5 total children, with 2 adopted. 8-10 grandchildren. One grandchild recently shot at 66 yo. Originally from [place], [place]. Moved here to live with new husband. 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 Non-smoker ETOH, none. She enjoys crafting and decorating.  FAMILY HISTORY: Family History  Problem Relation Age of Onset  . Colon cancer Neg Hx    indicated that her mother is deceased. She indicated that her father is deceased. She indicated that her sister is alive. She indicated that both of her brothers are alive. She indicated that her maternal grandmother is deceased. She  indicated that her maternal grandfather is deceased. She indicated that her paternal grandmother is alive. She indicated that her paternal grandfather is deceased.   Mother died at 78 yo when she was 37 yo, gunshot by her father Father died when she was 40 yo in a motorcycle accident 1 sister, 2 brothers Brother fell off a scaffold in [employer], walks with a cane. No family history of breast cancer that she is aware of.  ALLERGIES:  has No Known Allergies.  MEDICATIONS:  Current Outpatient Prescriptions  Medication Sig Dispense Refill  . albuterol (PROVENTIL HFA;VENTOLIN HFA) 108 (90 Base) MCG/ACT inhaler Inhale 1 puff into the lungs every 6 (six) hours as needed for wheezing or shortness of breath.    Marland Kitchen atorvastatin (LIPITOR) 20 MG tablet Take 20 mg by  mouth daily.    . budesonide-formoterol (SYMBICORT) 160-4.5 MCG/ACT inhaler Inhale 2 puffs into the lungs 2 (two) times daily.    . calcium carbonate (OS-CAL - DOSED IN MG OF ELEMENTAL CALCIUM) 1250 (500 Ca) MG tablet Take 1 tablet by mouth daily with breakfast.    . ergocalciferol (VITAMIN D2) 50000 UNITS capsule Take 1 capsule (50,000 Units total) by mouth once a week. 4 capsule 5  . fentaNYL (DURAGESIC - DOSED MCG/HR) 12 MCG/HR Place 1 patch (12.5 mcg total) onto the skin every 3 (three) days. 10 patch 0  . insulin glargine (LANTUS) 100 UNIT/ML injection Inject 24 Units into the skin at bedtime.    Marland Kitchen letrozole (FEMARA) 2.5 MG tablet Take 2.5 mg by mouth daily.    Marland Kitchen loratadine (CLARITIN) 10 MG tablet Take 10 mg by mouth daily as needed for allergies.    . naloxegol oxalate (MOVANTIK) 25 MG TABS tablet Take 1 tablet (25 mg total) by mouth daily. 30 tablet 3  . oxyCODONE-acetaminophen (PERCOCET) 10-325 MG tablet Take 1 tablet by mouth every 8 (eight) hours as needed for pain. 90 tablet 0  . pregabalin (LYRICA) 150 MG capsule Take 1 capsule (150 mg total) by mouth 2 (two) times daily. 90 capsule 2  . sitaGLIPtin (JANUVIA) 100 MG tablet Take 100 mg by mouth daily.    . valsartan (DIOVAN) 160 MG tablet Take 160 mg by mouth daily.     No current facility-administered medications for this visit.   Facility-Administered Medications Ordered in Other Visits  Medication Dose Route Frequency Provider Last Rate Last Dose  . 0.9 %  sodium chloride infusion   Intravenous Once Patrici Ranks, MD        Review of Systems  Constitutional: Negative.   HENT: Negative.        Hair loss from previous chemotherapy.  Eyes: Negative.   Respiratory: Negative.   Cardiovascular: Negative.   Gastrointestinal: Positive for abdominal pain.       Abdominal pain in the upper part occasionally. Hurts when she eats when this happens.   Genitourinary: Negative.   Musculoskeletal: Positive for back pain and joint  pain.       Back pain for the past few years. Leg pain. Arms and hands swelling. Hurts and feels tight.  Left arm hurts from her shoulder to her elbow.   Skin: Positive for itching and rash.       Rash and itching where her Fentanyl patches lay.   Endo/Heme/Allergies: Negative.   Psychiatric/Behavioral: Negative.   All other systems reviewed and are negative.  14 point ROS was done and is otherwise as detailed above or in HPI  PHYSICAL EXAMINATION: ECOG PERFORMANCE  STATUS: 1 - Symptomatic but completely ambulatory  Filed Vitals:   06/10/15 1012  BP: 110/68  Pulse: 93  Temp: 98.6 F (37 C)  Resp: 16   Filed Weights   06/10/15 1012  Weight: 143 lb 9.6 oz (65.137 kg)     Physical Exam  Constitutional: She is oriented to person, place, and time and well-developed, well-nourished, and in no distress.  HENT:  Head: Normocephalic and atraumatic.  Nose: Nose normal.  Mouth/Throat: Oropharynx is clear and moist. No oropharyngeal exudate.  Eyes: Conjunctivae and EOM are normal. Pupils are equal, round, and reactive to light. Right eye exhibits no discharge. Left eye exhibits no discharge. No scleral icterus.  Neck: Normal range of motion. Neck supple. No tracheal deviation present. No thyromegaly present.  Cardiovascular: Normal rate, regular rhythm and normal heart sounds.  Exam reveals no gallop and no friction rub.   No murmur heard. Pulmonary/Chest: Effort normal and breath sounds normal. She has no wheezes. She has no rales.  Abdominal: Soft. Bowel sounds are normal. She exhibits no distension and no mass. There is no tenderness. There is no rebound and no guarding.  Musculoskeletal: She exhibits no edema.  Decreased ROM LUE  Lymphadenopathy:    She has no cervical adenopathy.  Neurological: She is alert and oriented to person, place, and time. No cranial nerve deficit. Coordination abnormal.  Slow gait  Skin: Skin is warm and dry. No rash noted.  Psychiatric: Mood,  memory, affect and judgment normal.  Nursing note and vitals reviewed.  LABORATORY DATA:  I have reviewed the data as listed  Results for KIMBERLEE, SHOUN (MRN [id number]) as of 06/10/2015 09:05  Ref. Range 03/13/2015 10:07  Sodium Latest Ref Range: 135-145 mmol/L 140  Potassium Latest Ref Range: 3.5-5.1 mmol/L 3.8  Chloride Latest Ref Range: 101-111 mmol/L 101  CO2 Latest Ref Range: 22-32 mmol/L 30  BUN Latest Ref Range: 6-20 mg/dL 17  Creatinine Latest Ref Range: 0.44-1.00 mg/dL 0.88  Calcium Latest Ref Range: 8.9-10.3 mg/dL 9.4  EGFR (Non-African Amer.) Latest Ref Range: >60 mL/min >60  EGFR (African American) Latest Ref Range: >60 mL/min >60  Glucose Latest Ref Range: 65-99 mg/dL 115 (H)  Anion gap Latest Ref Range: 5-15  9  Alkaline Phosphatase Latest Ref Range: 38-126 U/L 53  Albumin Latest Ref Range: 3.5-5.0 g/dL 3.6  AST Latest Ref Range: 15-41 U/L 20  ALT Latest Ref Range: 14-54 U/L 17  Total Protein Latest Ref Range: 6.5-8.1 g/dL 7.5  Total Bilirubin Latest Ref Range: 0.3-1.2 mg/dL 0.4   RADIOLOGY: I have personally reviewed the radiological images as listed and agreed with the findings in the report.   Study Result     CLINICAL DATA: Abdominal pain and vomiting for 6 weeks. Biliary ductal dilatation seen on recent ultrasound.  EXAM: MRI ABDOMEN WITHOUT AND WITH CONTRAST (INCLUDING MRCP)  TECHNIQUE: Multiplanar multisequence MR imaging of the abdomen was performed both before and after the administration of intravenous contrast. Heavily T2-weighted images of the biliary and pancreatic ducts were obtained, and three-dimensional MRCP images were rendered by post processing.  CONTRAST: 66m MULTIHANCE GADOBENATE DIMEGLUMINE 529 MG/ML IV SOLN  COMPARISON: Ultrasound on 04/07/2015  FINDINGS: Lower chest: No acute findings.  Hepatobiliary: No mass or other parenchymal abnormality identified. The common bile duct measures 7 mm proximally, but shows  smooth tapering distally. There is no evidence of choledocholithiasis or biliary stricture. No evidence of intrahepatic biliary ductal dilatation.  Pancreas: No mass, inflammatory changes, or other parenchymal abnormality identified.  No evidence pancreatic ductal dilatation or pancreas divisum.  Spleen: Within normal limits in size and appearance.  Adrenals/Urinary Tract: No masses identified. No evidence of hydronephrosis. A few tiny renal cysts are noted bilaterally, largest in the lower pole the left kidney measuring 1.8 cm.  Stomach/Bowel: Visualized portions within the abdomen are unremarkable.  Vascular/Lymphatic: No pathologically enlarged lymph nodes identified. No abdominal aortic aneurysm demonstrated.  Other: None.  Musculoskeletal: No suspicious bone lesions identified.  IMPRESSION: Borderline dilatation of proximal common bile duct which measures 7 mm. No evidence of choledocholithiasis or other signs of biliary obstruction. No other significant abnormality identified.   Electronically Signed  By: Earle Gell M.D.  On: 05/21/2015 08:05   Study Result     CLINICAL DATA: Abdominal pain with vomiting for 4 days, personal history of breast cancer  EXAM: ABDOMEN ULTRASOUND COMPLETE  COMPARISON: None.  FINDINGS: Gallbladder: Gallbladder wall thickness is upper normal at 2.7 mm. There is no Murphy's sign and there are no calculi.  Common bile duct: Diameter: 9 mm. No filling defects within it.  Liver: No intrahepatic biliary dilatation. No sulcal hepatic parenchymal abnormalities. Normal echogenicity.  IVC: No abnormality visualized.  Pancreas: Visualized portion unremarkable.  Spleen: Size and appearance within normal limits.  Right Kidney: Length: 9.4 cm. Echogenicity within normal limits. No mass or hydronephrosis visualized.  Left Kidney: Length: 10.2 cm. Echogenicity within normal limits. No mass or hydronephrosis  visualized.  Abdominal aorta: No aneurysm visualized.  Other findings: None.  IMPRESSION: Upper normal gallbladder wall thickness without sonographic Murphy sign, nonspecific finding. Dilatation of the common bile duct. Etiology is uncertain but further evaluation with MRCP is suggested to exclude distal extrinsic or intrinsic obstructing process.   Electronically Signed  By: Skipper Cliche M.D.  On: 04/07/2015 13:35    Study Result     CLINICAL DATA: Patient with cough, wheezing and bronchitis for multiple weeks.  EXAM: CHEST 2 VIEW  COMPARISON: None.  FINDINGS: Normal cardiac and mediastinal contours. No consolidative pulmonary opacities. No pleural effusion or pneumothorax. Thoracic spine degenerative changes.  IMPRESSION: No active cardiopulmonary disease.   Electronically Signed  By: Lovey Newcomer M.D.  On: 04/07/2015 13:56   Study Result     EXAM: DUAL X-RAY ABSORPTIOMETRY (DXA) FOR BONE MINERAL DENSITY  IMPRESSION: Ordering Physician: Dr. Patrici Ranks,  Your patient Naida Escalante completed a BMD test on 02/27/2015 using the [place] (software version: 14.10) manufactured by [employer]. The following summarizes the results of our evaluation. PATIENT BIOGRAPHICAL: Name: SHANAN, FITZPATRICK Patient ID: [id number] Birth Date: Sep 13, 1960 Height: 64.0 in. Gender: Female Exam Date: 02/27/2015 Weight: 150.0 lbs. Indications: Hx Breast Ca, Low Calcium Intake, Partial Hysterectomy, Post Menopausal, Vitamin D Deficiency, Secondary Osteoporosis Fractures: Treatments: Femara, Vitamin D DENSITOMETRY RESULTS: Site Region Measured Date Measured Age WHO Classification Young Adult T-score BMD %Change vs. Previous Significant Change (*) AP Spine L1-L2 02/27/2015 56.2 Osteopenia -1.5 0.987 g/cm2  DualFemur Neck Left 02/27/2015 56.2 Osteopenia -1.5 0.824 g/cm2 ASSESSMENT: BMD  as determined from Femur Neck Left is 0.824 g/cm2 with a T-Score of -1.5. This patient is considered osteopenic according to [facility]) criteria. (L-3-4 was excluded due to advanced degenerative changes.)  World Health Organization [facility]) criteria for post-menopausal, Caucasian Women: Normal: T-score at or above -1 SD Osteopenia: T-score between -1 and -2.5 SD Osteoporosis: T-score at or below -2.5 SD  RECOMMENDATIONS: [place] recommends that FDA-approved medial therapies be considered in postmenopausal women and men age 42  or older with a: 1. Hip or vertebral (clinical or morphometric) fracture. 2. T-Score of < -2.5 at the spine or hip. 3. Ten-year fracture probability by FRAX of 3% or greater for hip fracture or 20% or greater for major osteoporotic fracture.  All treatment decisions require clinical judgment and consideration of individual patient factors, including patient preferences, co-morbidities, previous drug use, risk factors not captured in the FRAX model (e.g. falls, vitamin D deficiency, increased bone turnover, interval significant decline in bone density) and possible under-or over-estimation of fracture risk by FRAX.  All patients should ensure an adequate intake of dietary calcium (1200 mg/d) and vitamin D (800 IU daily) unless contraindicated.  FOLLOW-UP: People with diagnosed cases of osteoporosis or osteopenia should be regularly tested for bone mineral density. For patients eligible for Medicare, routine testing is allowed once every 2 years. Testing frequency can be increased for patients who have rapidly progressing disease, or for those who are receiving medical therapy to restore bone mass.  I have reviewed this report, and agree with the above findings.  [facility] Your patient Jessicamarie Amiri completed a FRAX assessment on 02/27/2015 using the [place] (analysis version: 14.10) manufactured by [employer]. The following summarizes the results of our evaluation.  PATIENT BIOGRAPHICAL: Name: RASHIKA, BETTES Patient ID: [id number] Birth Date: 1961-01-03 Height: 64.0 in. Gender: Female Age: [age] Weight: 150.0 lbs. Ethnicity: Black Exam Date: 02/27/2015  FRAX* RESULTS: (version: 3.5) 10-year Probability of Fracture1 Major Osteoporotic Fracture2 Hip Fracture 3.2% 0.3% Population: [place] (Black) Risk Factors: Secondary Osteoporosis  Based on Femur (Left) Neck BMD  1 -The 10-year probability of fracture may be lower than reported if the patient has received treatment. 2 -Major Osteoporotic Fracture: Clinical Spine, Forearm, Hip or Shoulder  *FRAX is a Materials engineer of the [employer] for Metabolic Bone Disease, a [place] (WHO) [facility].  ASSESSMENT: The probability of a major osteoporotic fracture is 3.2% within the next ten years.  The probability of a hip fracture is 0.3% within the next ten years.   Electronically Signed  By: Marijo Conception, M.D.  On: 02/27/2015 11:42    ASSESSMENT & PLAN:  L Breast Cancer  ER+, PR+, HER-2+. Completed one year of Herceptin Right breast fibroadenoma Original breast cancer diagnosis in 2008 Vitamin D Deficiency 5 years Tamoxifen High risk medication, FEMARA Osteopenia Lymphedema  She is overall doing well. No evidence of recurrence. She is up to date on mammography and DEXA.  I have written for a sleeve and glove. Will refer to PT for lymphedema therapy and for assessment of shoulder ROM  She will complete 5 years of femara at the end of 2018. She will be due for prolia in august. She is to continue on calcium and vitamin D. She will return for a follow up in 6 months.   All questions were answered. The patient knows to call the clinic  with any problems, questions or concerns.  This document serves as a record of services personally performed by Ancil Linsey, MD. It was created on her behalf by Kandace Blitz, a trained medical scribe. The creation of this record is based on the scribe's personal observations and the provider's statements to them. This document has been checked and approved by the attending provider.  I have reviewed the above documentation for accuracy and completeness, and I agree with the above.  This note was electronically signed.    Molli Hazard, MD  06/10/2015 10:52 AM

## 2015-06-19 ENCOUNTER — Ambulatory Visit (HOSPITAL_COMMUNITY): Payer: BLUE CROSS/BLUE SHIELD | Attending: Hematology & Oncology | Admitting: Physical Therapy

## 2015-06-19 DIAGNOSIS — M25612 Stiffness of left shoulder, not elsewhere classified: Secondary | ICD-10-CM | POA: Diagnosis not present

## 2015-06-19 DIAGNOSIS — M25512 Pain in left shoulder: Secondary | ICD-10-CM | POA: Insufficient documentation

## 2015-06-19 DIAGNOSIS — M6281 Muscle weakness (generalized): Secondary | ICD-10-CM | POA: Diagnosis not present

## 2015-06-19 DIAGNOSIS — I972 Postmastectomy lymphedema syndrome: Secondary | ICD-10-CM | POA: Diagnosis not present

## 2015-06-19 NOTE — Therapy (Addendum)
[place], [place], [address] Phone: [phone]   Fax:  [phone]  Physical Therapy Evaluation  Patient Details  Name: Krista Doyle MRN: [id number] Date of Birth: October 22, 1960 Referring Provider: Ancil Linsey   Encounter Date: 06/19/2015      PT End of Session - 06/19/15 1605    Visit Number 1   Number of Visits 18   Date for PT Re-Evaluation 07/19/15   Authorization Type BCBS   PT Start Time 1500   PT Stop Time 1600   PT Time Calculation (min) 60 min   Activity Tolerance Patient tolerated treatment well      Past Medical History  Diagnosis Date  . Breast cancer ([place]) 2008    left  . Back pain   . Neuropathy ([place])     extremities after chemo  . Diabetes ([place])   . Hypercholesterolemia   . Asthma     Past Surgical History  Procedure Laterality Date  . Breast lumpectomy    . Tonsillectomy    . Knee surgery      right  . Back surgery    . Colonoscopy      about 3 yrs ago in [place]    There were no vitals filed for this visit.       Subjective Assessment - 06/19/15 1521    Subjective Krista Doyle  states that she was diagnosed with breast cancer in 2008; she had chemo, then surgery and then radiation. She had increasedswelling of her Lt arm after her surgery and was diagnosed with  lymphedema in 2011.  She has a compression garment but it is old and ill fitting.   She noted increased swelling in her left  UE in late February.   She states that she feels most of her swelling along her axilla area.  She does not have a compression pump.      Pertinent History breast cancer 2008, chronic low back pain she is seeing pain mainagement at this time, neuropathy from chemo    Patient Stated Goals Pt is getting married June 10th wants to be able to wear her wedding rings; Able to use her Left arm to be able to do her hair             [facility] PT Assessment - 06/19/15 0001     Assessment   Medical Diagnosis Lt UE lymphedema   Referring Provider Ancil Linsey    Onset Date/Surgical Date 12/24/09   Hand Dominance Right   Precautions   Precautions --  lymphedema    Balance Screen   Has the patient fallen in the past 6 months No   Has the patient had a decrease in activity level because of a fear of falling?  No   Is the patient reluctant to leave their home because of a fear of falling?  No   Prior Function   Level of Independence Independent   Leisure flower arrangement.    Observation/Other Assessments   Focus on Therapeutic Outcomes (FOTO)  --  life impact score 61/100   ROM / Strength   AROM / PROM / Strength AROM   AROM   AROM Assessment Site Shoulder   Right/Left Shoulder Left   Left Shoulder Flexion 90 Degrees  sitting   Left Shoulder ABduction 105 Degrees  sitting    Left Shoulder External Rotation 60 Degrees  sitting            LYMPHEDEMA/ONCOLOGY  QUESTIONNAIRE - 06/19/15 1523    Type   Cancer Type Lt breast cancer   Surgeries   Lumpectomy Date 07/10/09   Number Lymph Nodes Removed 16   Date Lymphedema/Swelling Started   Date 03/27/15   Treatment   Active Chemotherapy Treatment No   Past Chemotherapy Treatment Yes   Date --  12/25/2006    Active Radiation Treatment No   Past Radiation Treatment Yes   Date --  08/2009:  for one and a half months.    Body Site Lt breast    Current Hormone Treatment Yes   Date 06/19/15   Drug Name Femora    What other symptoms do you have   Are you Having Heaviness or Tightness Yes   Are you having Pain Yes   Are you having pitting edema No   Is it Hard or Difficult finding clothes that fit Yes  some but not all clothing    Do you have infections No   Lymphedema Stage   Stage STAGE 2 SPONTANEOUSLY IRREVERSIBLE   Lymphedema Assessments   Lymphedema Assessments --   Right Upper Extremity Lymphedema   15 cm Proximal to Olecranon Process 31 cm   10 cm Proximal to Olecranon Process 30.3  cm   Olecranon Process 25 cm   15 cm Proximal to Ulnar Styloid Process 24 cm   10 cm Proximal to Ulnar Styloid Process 20.5 cm   Just Proximal to Ulnar Styloid Process 15.5 cm   Across Hand at [employer] 19.2 cm   At [place] of 2nd Digit 7 cm   At [facility] of Thumb 6.8 cm   Left Upper Extremity Lymphedema   15 cm Proximal to Olecranon Process 32 cm   10 cm Proximal to Olecranon Process 30.8 cm   Olecranon Process 27.2 cm   15 cm Proximal to Ulnar Styloid Process 24.7 cm   10 cm Proximal to Ulnar Styloid Process 21.6 cm   Just Proximal to Ulnar Styloid Process 16.2 cm   Across Hand at [employer] 19.1 cm   At [place] of 2nd Digit 7.1 cm   At [facility] of Thumb 6.8 cm                [facility] Adult PT Treatment/Exercise - 06/19/15 0001    Exercises   Exercises Shoulder   Shoulder Exercises: Seated   External Rotation Both;5 reps   Internal Rotation 5 reps   Flexion 5 reps;Both   Abduction 5 reps;Both   Other Seated Exercises diapharahmic breathing x 3'                 PT Education - 06/19/15 1557    Education provided Yes   Education Details Lymphedema tips, exercises to increase lymphatic circulation,   Person(s) Educated Patient   Methods Explanation;Demonstration;Handout   Comprehension Verbalized understanding;Returned demonstration          PT Short Term Goals - 06/19/15 1621    PT SHORT TERM GOAL #1   Title Pt to have 2 cm decrease in size of her Lt UE to allow her to wear all of her shirts    Time 3   Period Weeks   Status New   PT SHORT TERM GOAL #2   Title Pt to be able to states signs and symptoms of cellulitis to prevent systemic infection    Time 2   Period Weeks   Status New   PT SHORT TERM GOAL #3   Title Pt  ROM in her Left UE to be increased by 30 degrees to allow patient to fix her own hair    Time 4   Period Weeks   PT SHORT TERM GOAL #4   Title Pt pain in her left UE  level to be no greater than a 2/10 to allow her to complete 20  minutes worth of light housework    Time 3   Period Weeks   Status New           PT Long Term Goals - 06/19/15 1618    PT LONG TERM GOAL #1   Title Pt to have full functional range of motion in her left UE  to allow patient to reach overhead to put dishes away   Time 6   Period Weeks   Status New   PT LONG TERM GOAL #2   Title Pt pain to be 0/10 in her Lt UE to allow her to complete 30 mintues of continuous light housework    Time 6   Period Weeks   Status New   PT LONG TERM GOAL #3   Title Pt to have obtained a compression pump and compression garment to be able to progress to maintainance phase of lymphedema.    Time 6   Period Weeks   Status New               Plan - 06/19/15 1558    Clinical Impression Statement Pt is a 55 yo female who was diagnosed with Lt breast cancer in 2008. She went through chemo then had surgery and radiation.  She was diagnosed with lymphedema in 2011 after her surgery.  She had a sucessful session of manual therapy and recieved a compression garment but nobody told her that she would need to wear it forever or that she should get a new one every six months.  She comes to the departement with three month history of increased Lt UE edema and discomfort as well as decreased ROM and use of her UE.  She will benefit from skilled physical therapy  for therapeutic exercises to regain ROM and function of her Lt UE and manual lymph drainage to decrease her swelling and pain .    Rehab Potential Good   PT Frequency 3x / week   PT Duration 6 weeks   PT Treatment/Interventions ADLs/Self Care Home Management;Patient/family education;Therapeutic activities;Therapeutic exercise;Manual techniques;Manual lymph drainage;Compression bandaging   PT Next Visit Plan Begin Pulley and manual lymph drainage.  Therapist has sent out for a compression pump.    Consulted and Agree with Plan of Care Patient      Patient will benefit from skilled therapeutic  intervention in order to improve the following deficits and impairments:  Pain, Increased edema, Decreased strength, Decreased activity tolerance, Decreased range of motion  Visit Diagnosis: Postmastectomy lymphedema  Pain in left shoulder  Stiffness of left shoulder, not elsewhere classified  Muscle weakness (generalized)  G code based on life impact G8990 CK G8991 CJ   Problem List Patient Active Problem List   Diagnosis Date Noted  . 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 Constipation due to opioid therapy 05/08/2015  . Genetic testing 03/17/2015  . Osteopenia determined by x-ray 03/13/2015  . Lumbar spondylosis 02/11/2015  . Lumbar radiculopathy 02/11/2015  . Chemotherapy-induced peripheral neuropathy ([place]) 02/11/2015  . Vitamin D deficiency 02/02/2015  . Breast cancer of upper-outer quadrant of left female breast ([place]) 12/22/2014  . High risk medication use 12/22/2014  Rayetta Humphrey, PT CLT [phone] 06/19/2015, 4:22 PM  [facility] [address] [place], [place], [address] Phone: [phone]   Fax:  [phone]  Name: Krista Doyle MRN: [id number] Date of Birth: 12/09/1960

## 2015-06-19 NOTE — Addendum Note (Signed)
Addended by: Leeroy Cha on: 06/19/2015 04:24 PM   Modules accepted: Orders

## 2015-06-22 ENCOUNTER — Telehealth (HOSPITAL_COMMUNITY): Payer: Self-pay | Admitting: Physical Therapy

## 2015-06-22 ENCOUNTER — Telehealth: Payer: Self-pay

## 2015-06-22 ENCOUNTER — Ambulatory Visit (HOSPITAL_COMMUNITY): Payer: BLUE CROSS/BLUE SHIELD | Admitting: Physical Therapy

## 2015-06-22 DIAGNOSIS — T451X5A Adverse effect of antineoplastic and immunosuppressive drugs, initial encounter: Secondary | ICD-10-CM

## 2015-06-22 DIAGNOSIS — G620 Drug-induced polyneuropathy: Secondary | ICD-10-CM

## 2015-06-22 DIAGNOSIS — M47816 Spondylosis without myelopathy or radiculopathy, lumbar region: Secondary | ICD-10-CM

## 2015-06-22 DIAGNOSIS — M5416 Radiculopathy, lumbar region: Secondary | ICD-10-CM

## 2015-06-22 MED ORDER — OXYCODONE-ACETAMINOPHEN 10-325 MG PO TABS: 1.0000 | ORAL_TABLET | Freq: Three times a day (TID) | ORAL | Status: DC | PRN

## 2015-06-22 NOTE — Telephone Encounter (Signed)
Pt left a message stating that the pharmacy misplaced her Percocet rx. It was too early to fill when she received it at the last OV per her insurance, so the pharmacy told her that they would keep it on file. When she went to the pharmacy to get the rx filled, the pharmacy told her it had been misplaced. Pt is looking for another Percocet rx. Please advise.

## 2015-06-22 NOTE — Telephone Encounter (Signed)
Pt did not show for appointment. Contacted and requested to cancel next scheduled appt for this week and reschedule all until next week.  Pasted request to Hennie Duos to call and reschedule patient.  Teena Irani, PTA/CLT [phone]

## 2015-06-22 NOTE — Telephone Encounter (Signed)
Called pharmacy and confirmed, they do not have script on file

## 2015-06-22 NOTE — Telephone Encounter (Signed)
Replacement rx written.

## 2015-06-23 ENCOUNTER — Ambulatory Visit (HOSPITAL_COMMUNITY): Payer: BLUE CROSS/BLUE SHIELD | Admitting: Hematology & Oncology

## 2015-06-23 NOTE — Telephone Encounter (Signed)
Pt notified. Rx will be up front for pick up.

## 2015-06-24 ENCOUNTER — Encounter (HOSPITAL_COMMUNITY): Payer: BLUE CROSS/BLUE SHIELD | Admitting: Physical Therapy

## 2015-07-06 ENCOUNTER — Encounter: Payer: BLUE CROSS/BLUE SHIELD | Attending: Physical Medicine & Rehabilitation | Admitting: Registered Nurse

## 2015-07-06 ENCOUNTER — Encounter: Payer: Self-pay | Admitting: Registered Nurse

## 2015-07-06 VITALS — BP 125/81 | HR 78 | Resp 14

## 2015-07-06 DIAGNOSIS — G620 Drug-induced polyneuropathy: Secondary | ICD-10-CM | POA: Diagnosis not present

## 2015-07-06 DIAGNOSIS — M545 Low back pain: Secondary | ICD-10-CM | POA: Diagnosis not present

## 2015-07-06 DIAGNOSIS — Z5181 Encounter for therapeutic drug level monitoring: Secondary | ICD-10-CM | POA: Diagnosis not present

## 2015-07-06 DIAGNOSIS — Z853 Personal history of malignant neoplasm of breast: Secondary | ICD-10-CM | POA: Diagnosis not present

## 2015-07-06 DIAGNOSIS — G8929 Other chronic pain: Secondary | ICD-10-CM | POA: Insufficient documentation

## 2015-07-06 DIAGNOSIS — Z9221 Personal history of antineoplastic chemotherapy: Secondary | ICD-10-CM | POA: Diagnosis not present

## 2015-07-06 DIAGNOSIS — Z79899 Other long term (current) drug therapy: Secondary | ICD-10-CM

## 2015-07-06 DIAGNOSIS — M5416 Radiculopathy, lumbar region: Secondary | ICD-10-CM

## 2015-07-06 DIAGNOSIS — M47816 Spondylosis without myelopathy or radiculopathy, lumbar region: Secondary | ICD-10-CM | POA: Diagnosis not present

## 2015-07-06 DIAGNOSIS — I890 Lymphedema, not elsewhere classified: Secondary | ICD-10-CM | POA: Insufficient documentation

## 2015-07-06 DIAGNOSIS — T451X5A Adverse effect of antineoplastic and immunosuppressive drugs, initial encounter: Secondary | ICD-10-CM

## 2015-07-06 MED ORDER — FENTANYL 12 MCG/HR TD PT72: 12.5000 ug | MEDICATED_PATCH | TRANSDERMAL | Status: DC

## 2015-07-06 MED ORDER — OXYCODONE-ACETAMINOPHEN 10-325 MG PO TABS: 1.0000 | ORAL_TABLET | Freq: Three times a day (TID) | ORAL | Status: DC | PRN

## 2015-07-06 NOTE — Progress Notes (Signed)
Subjective:    Patient ID: Krista Doyle, female    DOB: 11-Jun-1960, 55 y.o.   MRN: [id number]  HPI: [name] is a 55 year old female who returns for follow up for chronic pain and medication refill. She states her pain is located in her lower back radiating into her bilateral hips and right lower extremity anteriorly. She rates her pain 5. Her current exercise regime is walking and she will be starting Physical Therapy on 07/08/15 three days a week. Also states she will be getting married on 07/11/15.  Pain Inventory Average Pain 7 Pain Right Now 5 My pain is sharp, stabbing and tingling  In the last 24 hours, has pain interfered with the following? General activity 7 Relation with others 5 Enjoyment of life 7 What TIME of day is your pain at its worst? evening Sleep (in general) Poor  Pain is worse with: walking, bending, sitting and standing Pain improves with: NA Relief from Meds: 6  Mobility use a cane use a walker how many minutes can you walk? 5 ability to climb steps?  yes do you drive?  yes Do you have any goals in this area?  yes  Function disabled: date disabled NA  Neuro/Psych numbness tingling spasms  Prior Studies Any changes since last visit?  no  Physicians involved in your care Any changes since last visit?  no   Family History  Problem Relation Age of Onset  . Colon cancer Neg Hx    Social History   Social History  . Marital Status: Married    Spouse Name: N/A  . Number of Children: N/A  . Years of Education: N/A   Occupational History  . Disabled    Social History Main Topics  . Smoking status: Never Smoker   . Smokeless tobacco: None  . Alcohol Use: No  . Drug Use: No  . Sexual Activity: Yes   Other Topics Concern  . None   Social History Narrative   Past Surgical History  Procedure Laterality Date  . Breast lumpectomy    . Tonsillectomy    . Knee surgery      right  . Back surgery    .  Colonoscopy      about 3 yrs ago in [place]   Past Medical History  Diagnosis Date  . Breast cancer ([place]) 2008    left  . Back pain   . Neuropathy ([place])     extremities after chemo  . Diabetes ([place])   . Hypercholesterolemia   . Asthma    BP 125/81 mmHg  Pulse 78  Resp 14  SpO2 99%  Opioid Risk Score:   Fall Risk Score:  `1  Depression screen PHQ 2/9  Depression screen [facility] 2/9 07/06/2015 04/28/2015 02/11/2015  Decreased Interest 0 1 1  Down, Depressed, Hopeless 0 0 0  PHQ - 2 Score 0 1 1  Altered sleeping - - 3  Tired, decreased energy - - 0  Change in appetite - - 0  Feeling bad or failure about yourself  - - 0  Trouble concentrating - - 0  Moving slowly or fidgety/restless - - 0  Suicidal thoughts - - 0  PHQ-9 Score - - 4  Difficult doing work/chores - - Somewhat difficult        Review of Systems  Constitutional: Positive for unexpected weight change.  Respiratory: Positive for wheezing.   Cardiovascular:       Limb Swelling  All  other systems reviewed and are negative.      Objective:   Physical Exam  Constitutional: She is oriented to person, place, and time. She appears well-developed and well-nourished.  HENT:  Head: Normocephalic and atraumatic.  Neck: Normal range of motion. Neck supple.  Cardiovascular: Normal rate and regular rhythm.   Pulmonary/Chest: Effort normal and breath sounds normal.  Musculoskeletal:  Normal Muscle Bulk and Muscle Testing Reveals: Upper Extremities: Right: Full ROM and Muscle Strength 5/5 Left: Decreased ROM 45 Degrees and Muscle Strength 3/5 Lumbar Hypersensitivity Lower Extremities: Left: Full ROM and Muscle Strength 5/5 Right: Decreased ROM and Muscle Strength 4/5 Right Lower Extremity Flexion Produces Pain into Right Hip Arises from chair slowly using straight cane for support Narrow Based Gait  Neurological: She is alert and oriented to person, place, and time.  Skin: Skin is warm and dry.  Psychiatric:  She has a normal mood and affect.  Nursing note and vitals reviewed.         Assessment & Plan:  1. Chronic low back pain, lumbar spondylosis, spondylolisthesis, with lumbarl radiculopathy. Refilled: Fentanyl 12 mcg one patch every three days #10 and Oxycodone 10/325 mg one tablet every 8 hours as needed for pain #90. Oxycodone script post dated to be filled on 07/20/15. We will continue the opioid monitoring program, this consists of regular clinic visits, examinations, urine drug screen, pill counts as well as use of [place] Controlled Substance reporting System. 2. Chemotherapy induced peripheral neuropathy: Continue Lyrica at 150mg  TID   20 minutes of face to face patient care time was spent during this visit. All questions were encouraged and answered.  F/U in 1 month

## 2015-07-08 ENCOUNTER — Telehealth (HOSPITAL_COMMUNITY): Payer: Self-pay | Admitting: *Deleted

## 2015-07-08 ENCOUNTER — Ambulatory Visit (HOSPITAL_COMMUNITY): Payer: BLUE CROSS/BLUE SHIELD | Attending: Hematology & Oncology | Admitting: Physical Therapy

## 2015-07-08 DIAGNOSIS — M25512 Pain in left shoulder: Secondary | ICD-10-CM | POA: Insufficient documentation

## 2015-07-08 DIAGNOSIS — M6281 Muscle weakness (generalized): Secondary | ICD-10-CM | POA: Diagnosis not present

## 2015-07-08 DIAGNOSIS — M25612 Stiffness of left shoulder, not elsewhere classified: Secondary | ICD-10-CM | POA: Diagnosis not present

## 2015-07-08 DIAGNOSIS — I972 Postmastectomy lymphedema syndrome: Secondary | ICD-10-CM | POA: Insufficient documentation

## 2015-07-08 NOTE — Therapy (Signed)
[place], [place], [address] Phone: [phone]   Fax:  [phone]  Physical Therapy Treatment  Patient Details  Name: Krista Doyle MRN: [id number] Date of Birth: 20-Aug-1960 Referring Provider: Ancil Linsey   Encounter Date: 07/08/2015      PT End of Session - 07/08/15 1213    Visit Number 2   Number of Visits 18   Date for PT Re-Evaluation 07/19/15   Authorization Type BCBS   PT Start Time 1120   PT Stop Time 1208   PT Time Calculation (min) 48 min   Activity Tolerance Patient tolerated treatment well      Past Medical History  Diagnosis Date  . Breast cancer ([place]) 2008    left  . Back pain   . Neuropathy ([place])     extremities after chemo  . Diabetes ([place])   . Hypercholesterolemia   . Asthma     Past Surgical History  Procedure Laterality Date  . Breast lumpectomy    . Tonsillectomy    . Knee surgery      right  . Back surgery    . Colonoscopy      about 3 yrs ago in [place]    There were no vitals filed for this visit.      Subjective Assessment - 07/08/15 1209    Subjective Pt has not been to therapy since inital evaluation.  Pt states that she wants to be wrapped.  Explaine to pt that she would need to provide bandages and gave pt list of bandages needed if she wants to have compression bandaging done    Currently in Pain? Yes   Pain Score 5    Pain Location Arm   Pain Orientation Left   Pain Descriptors / Indicators Tightness   Pain Type Chronic pain   Pain Onset More than a month ago   Pain Frequency Constant   Aggravating Factors  heat/ using it to much   Pain Relieving Factors elevation, compression                          [facility] Adult PT Treatment/Exercise - 07/08/15 0001    Shoulder Exercises: Seated   Other Seated Exercises Pulley exercises 3' flexion, 3' abduction    Manual Therapy   Manual Therapy Manual Lymphatic Drainage (MLD)    Manual Lymphatic Drainage (MLD) supraclavicular, deep and superfical abdominal routing fluid using intraxillary and Lt axillary/inguinal anastomosis followed by Lt UE.  Manual completed both anteriorly and posteriorly.                  PT Short Term Goals - 07/08/15 1217    PT SHORT TERM GOAL #1   Title Pt to have 2 cm decrease in size of her Lt UE to allow her to wear all of her shirts    Time 3   Period Weeks   Status On-going   PT SHORT TERM GOAL #2   Title Pt to be able to states signs and symptoms of cellulitis to prevent systemic infection    Time 2   Period Weeks   Status On-going   PT SHORT TERM GOAL #3   Title Pt ROM in her Left UE to be increased by 30 degrees to allow patient to fix her own hair    Time 4   Period Weeks   Status On-going   PT SHORT TERM GOAL #4  Title Pt pain in her left UE  level to be no greater than a 2/10 to allow her to complete 20 minutes worth of light housework    Time 3   Period Weeks   Status On-going           PT Long Term Goals - 07/08/15 1217    PT LONG TERM GOAL #1   Title Pt to have full functional range of motion in her left UE  to allow patient to reach overhead to put dishes away   Time 6   Period Weeks   Status On-going   PT LONG TERM GOAL #2   Title Pt pain to be 0/10 in her Lt UE to allow her to complete 30 mintues of continuous light housework    Time 6   Period Weeks   Status On-going   PT LONG TERM GOAL #3   Title Pt to have obtained a compression pump and compression garment to be able to progress to maintainance phase of lymphedema.    Time 6   Period Weeks   Status On-going               Plan - 07/08/15 1214    Clinical Impression Statement Therapist explained to pt that she does not feel that she needs wrapping that if she would show for her appointments the therapist believes that completing the manual will be enough to decreased the swelling in her Lt arm.  However, if pt desires we will  begin compression bandaging when pt purchases the bandages.    PT Frequency 3x / week   PT Duration 6 weeks   PT Treatment/Interventions ADLs/Self Care Home Management;Patient/family education;Therapeutic activities;Therapeutic exercise;Manual techniques;Manual lymph drainage;Compression bandaging   PT Next Visit Plan Pt will be gone next week for her honeymood.  Continue with manual lymph techniques begin bandaging if pt brings bandages in.  Instruce in wall walk for improved ROM       Patient will benefit from skilled therapeutic intervention in order to improve the following deficits and impairments:  Pain, Increased edema, Decreased strength, Decreased activity tolerance, Decreased range of motion  Visit Diagnosis: Postmastectomy lymphedema  Pain in left shoulder  Stiffness of left shoulder, not elsewhere classified  Muscle weakness (generalized)     Problem List Patient Active Problem List   Diagnosis Date Noted  . 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 Constipation due to opioid therapy 05/08/2015  . Genetic testing 03/17/2015  . Osteopenia determined by x-ray 03/13/2015  . Lumbar spondylosis 02/11/2015  . Lumbar radiculopathy 02/11/2015  . Chemotherapy-induced peripheral neuropathy ([place]) 02/11/2015  . Vitamin D deficiency 02/02/2015  . Breast cancer of upper-outer quadrant of left female breast ([place]) 12/22/2014  . High risk medication use 12/22/2014   Rayetta Humphrey, PT CLT [phone] 07/08/2015, 12:18 PM  [facility] [address] [place], [place], [address] Phone: [phone]   Fax:  [phone]  Name: Krista Doyle MRN: [id number] Date of Birth: 08-12-1960

## 2015-07-09 ENCOUNTER — Ambulatory Visit (HOSPITAL_COMMUNITY): Payer: BLUE CROSS/BLUE SHIELD | Admitting: Physical Therapy

## 2015-07-09 ENCOUNTER — Telehealth (HOSPITAL_COMMUNITY): Payer: Self-pay | Admitting: Physical Therapy

## 2015-07-09 NOTE — Telephone Encounter (Signed)
Pt did not show for appt.  Tried to contact several times, however line was busy. Teena Irani, PTA/CLT [phone]

## 2015-07-13 ENCOUNTER — Encounter (HOSPITAL_COMMUNITY): Payer: BLUE CROSS/BLUE SHIELD | Admitting: Physical Therapy

## 2015-07-14 ENCOUNTER — Encounter (HOSPITAL_COMMUNITY): Payer: BLUE CROSS/BLUE SHIELD | Admitting: Physical Therapy

## 2015-07-15 ENCOUNTER — Encounter (HOSPITAL_COMMUNITY): Payer: BLUE CROSS/BLUE SHIELD | Admitting: Physical Therapy

## 2015-07-16 ENCOUNTER — Encounter (HOSPITAL_COMMUNITY): Payer: BLUE CROSS/BLUE SHIELD | Admitting: Physical Therapy

## 2015-07-20 ENCOUNTER — Ambulatory Visit (HOSPITAL_COMMUNITY): Payer: BLUE CROSS/BLUE SHIELD | Admitting: Physical Therapy

## 2015-07-20 ENCOUNTER — Encounter (HOSPITAL_COMMUNITY): Payer: BLUE CROSS/BLUE SHIELD | Admitting: Physical Therapy

## 2015-07-20 DIAGNOSIS — M25512 Pain in left shoulder: Secondary | ICD-10-CM | POA: Diagnosis not present

## 2015-07-20 DIAGNOSIS — I972 Postmastectomy lymphedema syndrome: Secondary | ICD-10-CM

## 2015-07-20 DIAGNOSIS — M6281 Muscle weakness (generalized): Secondary | ICD-10-CM | POA: Diagnosis not present

## 2015-07-20 DIAGNOSIS — M25612 Stiffness of left shoulder, not elsewhere classified: Secondary | ICD-10-CM

## 2015-07-20 NOTE — Therapy (Signed)
[place], [place], [address] Phone: [phone]   Fax:  [phone]  Physical Therapy Treatment  Patient Details  Name: Krista Doyle MRN: [id number] Date of Birth: 1960-07-18 Referring Provider: Ancil Linsey   Encounter Date: 07/20/2015      PT End of Session - 07/20/15 1528    Visit Number 3   Number of Visits 18   Date for PT Re-Evaluation 07/19/15   Authorization Type BCBS   PT Start Time 1400   PT Stop Time 0630   PT Time Calculation (min) 42 min   Activity Tolerance Patient tolerated treatment well      Past Medical History  Diagnosis Date  . Breast cancer ([place]) 2008    left  . Back pain   . Neuropathy ([place])     extremities after chemo  . Diabetes ([place])   . Hypercholesterolemia   . Asthma     Past Surgical History  Procedure Laterality Date  . Breast lumpectomy    . Tonsillectomy    . Knee surgery      right  . Back surgery    . Colonoscopy      about 3 yrs ago in [place]    There were no vitals filed for this visit.      Subjective Assessment - 07/20/15 1458    Subjective Pt states she is just returning from her honeymoon.  comes today with bilateral sleeves and gauntlets and states it is helping alot, however still having alot of swelling at night time.  MD has ordered night time compression and she goes next week to be measured.  Pt still has not received her pump.    Currently in Pain? No/denies            [facility] PT Assessment - 07/20/15 1412    Assessment   Medical Diagnosis Lt UE lymphedema   AROM   AROM Assessment Site Shoulder   Right/Left Shoulder Left   Left Shoulder Flexion 140 Degrees  seated 140, supine 160   Left Shoulder ABduction 170 Degrees  supine   Left Shoulder External Rotation 90 Degrees  supine           LYMPHEDEMA/ONCOLOGY QUESTIONNAIRE - 07/20/15 1415    Right Upper Extremity Lymphedema   15 cm Proximal to Olecranon Process  29 cm   10 cm Proximal to Olecranon Process 28.5 cm   Olecranon Process 23.5 cm   15 cm Proximal to Ulnar Styloid Process 24 cm   10 cm Proximal to Ulnar Styloid Process 21 cm   Just Proximal to Ulnar Styloid Process 14.8 cm   Across Hand at [employer] 18.1 cm   At [place] of 2nd Digit 6.2 cm   At [facility] of Thumb 6.5 cm   Left Upper Extremity Lymphedema   15 cm Proximal to Olecranon Process 29.5 cm   10 cm Proximal to Olecranon Process 29.3 cm   Olecranon Process 24.6 cm   15 cm Proximal to Ulnar Styloid Process 24.5 cm   10 cm Proximal to Ulnar Styloid Process 22 cm   Just Proximal to Ulnar Styloid Process 15.3 cm   Across Hand at [employer] 18.2 cm   At [place] of 2nd Digit 6.3 cm   At [facility] of Thumb 5.8 cm                          PT Education -  07/20/15 1534    Education provided Yes   Education Details instructed to f/u with clinic if does not hear from [employer] rep; continue HEP and self massage.     Person(s) Educated Patient   Methods Explanation   Comprehension Verbalized understanding          PT Short Term Goals - 07/20/15 1510    PT SHORT TERM GOAL #1   Title Pt to have 2 cm decrease in size of her Lt UE to allow her to wear all of her shirts    Time 3   Period Weeks   Status Partially Met   PT SHORT TERM GOAL #2   Title Pt to be able to states signs and symptoms of cellulitis to prevent systemic infection    Time 2   Period Weeks   Status Achieved   PT SHORT TERM GOAL #3   Title Pt ROM in her Left UE to be increased by 30 degrees to allow patient to fix her own hair    Time 4   Period Weeks   Status Achieved   PT SHORT TERM GOAL #4   Title Pt pain in her left UE  level to be no greater than a 2/10 to allow her to complete 20 minutes worth of light housework    Time 3   Period Weeks   Status Achieved           PT Long Term Goals - 07/20/15 1511    PT LONG TERM GOAL #1   Title Pt to have full functional range of motion in  her left UE  to allow patient to reach overhead to put dishes away   Time 6   Period Weeks   Status Achieved   PT LONG TERM GOAL #2   Title Pt pain to be 0/10 in her Lt UE to allow her to complete 30 mintues of continuous light housework    Time 6   Period Weeks   Status Achieved   PT LONG TERM GOAL #3   Title Pt to have obtained a compression pump and compression garment to be able to progress to maintainance phase of lymphedema.    Time 6   Period Weeks   Status Partially Met  has not received her pump yet               Plan - 07/20/15 1528    Clinical Impression Statement Pt was nearly 15 minutes late for appt.  Pt comes today with bilateral sleeves with gauntlets.  Pt reports her UE's feel much better, however still swelling at night.  Pt is to get night sleeve measurements next week.  Remeasured this session with overall reduction of 1cm average bilaterally.  No induration present or visible swelling in fingers or UEs.  Followed up with flexitouch Donneta Romberg) who stated no order was received.  All info faxed to him as requested.  Pt has met most goals for therapy and is ready for maintenance phase.  Pt instructed to contact clinic if she does not hear from rep by end of this week.    PT Frequency 3x / week   PT Duration 6 weeks   PT Treatment/Interventions ADLs/Self Care Home Management;Patient/family education;Therapeutic activities;Therapeutic exercise;Manual techniques;Manual lymph drainage;Compression bandaging   PT Next Visit Plan Pt to be discharged to maintenance phase of treatment.        Patient will benefit from skilled therapeutic intervention in order to improve the following deficits and  impairments:  Pain, Increased edema, Decreased strength, Decreased activity tolerance, Decreased range of motion  Visit Diagnosis: Postmastectomy lymphedema  Pain in left shoulder  Stiffness of left shoulder, not elsewhere classified  Muscle weakness  (generalized)    PHYSICAL THERAPY DISCHARGE SUMMARY  Visits from Start of Care: 3  Current functional level related to goals / functional outcomes: See above    Remaining deficits: See above   Education / Equipment: The importance of using compression, self massage   Plan: Patient agrees to discharge.  Patient goals were not met. Patient is being discharged due to meeting the stated rehab goals.  ?????      Problem List Patient Active Problem List   Diagnosis Date Noted  . 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 Constipation due to opioid therapy 05/08/2015  . Genetic testing 03/17/2015  . Osteopenia determined by x-ray 03/13/2015  . Lumbar spondylosis 02/11/2015  . Lumbar radiculopathy 02/11/2015  . Chemotherapy-induced peripheral neuropathy ([place]) 02/11/2015  . Vitamin D deficiency 02/02/2015  . Breast cancer of upper-outer quadrant of left female breast ([place]) 12/22/2014  . High risk medication use 12/22/2014    Teena Irani, PTA/CLT [phone]  07/20/2015, 3:36 PM  [place] [address] [place], [place], [address] Phone: [phone]   Fax:  [phone]  Name: Krista Doyle MRN: [id number] Date of Birth: 26-Jun-1960  Rayetta Humphrey, [place] CLT [phone]

## 2015-07-21 DIAGNOSIS — C50919 Malignant neoplasm of unspecified site of unspecified female breast: Secondary | ICD-10-CM | POA: Diagnosis not present

## 2015-07-22 ENCOUNTER — Encounter (HOSPITAL_COMMUNITY): Payer: BLUE CROSS/BLUE SHIELD | Admitting: Physical Therapy

## 2015-07-23 ENCOUNTER — Encounter (HOSPITAL_COMMUNITY): Payer: BLUE CROSS/BLUE SHIELD | Admitting: Physical Therapy

## 2015-07-24 ENCOUNTER — Encounter (HOSPITAL_COMMUNITY): Payer: BLUE CROSS/BLUE SHIELD | Admitting: Physical Therapy

## 2015-07-27 ENCOUNTER — Encounter (HOSPITAL_COMMUNITY): Payer: BLUE CROSS/BLUE SHIELD | Admitting: Physical Therapy

## 2015-07-29 ENCOUNTER — Encounter (HOSPITAL_COMMUNITY): Payer: BLUE CROSS/BLUE SHIELD | Admitting: Physical Therapy

## 2015-07-30 ENCOUNTER — Encounter (HOSPITAL_COMMUNITY): Payer: BLUE CROSS/BLUE SHIELD | Admitting: Physical Therapy

## 2015-07-31 ENCOUNTER — Encounter (HOSPITAL_COMMUNITY): Payer: BLUE CROSS/BLUE SHIELD | Admitting: Physical Therapy

## 2015-08-03 ENCOUNTER — Encounter (HOSPITAL_COMMUNITY): Payer: BLUE CROSS/BLUE SHIELD | Admitting: Physical Therapy

## 2015-08-05 ENCOUNTER — Encounter (HOSPITAL_COMMUNITY): Payer: BLUE CROSS/BLUE SHIELD | Admitting: Physical Therapy

## 2015-08-05 ENCOUNTER — Encounter: Payer: BLUE CROSS/BLUE SHIELD | Admitting: Registered Nurse

## 2015-08-07 ENCOUNTER — Emergency Department (HOSPITAL_COMMUNITY): Payer: BLUE CROSS/BLUE SHIELD

## 2015-08-07 ENCOUNTER — Encounter (HOSPITAL_COMMUNITY): Payer: Self-pay | Admitting: Emergency Medicine

## 2015-08-07 ENCOUNTER — Encounter: Payer: Self-pay | Admitting: Registered Nurse

## 2015-08-07 ENCOUNTER — Encounter: Payer: BLUE CROSS/BLUE SHIELD | Attending: Physical Medicine & Rehabilitation | Admitting: Registered Nurse

## 2015-08-07 ENCOUNTER — Emergency Department (HOSPITAL_COMMUNITY)
Admission: EM | Admit: 2015-08-07 | Discharge: 2015-08-07 | Disposition: A | Discharge status: Home / Self Care | Payer: BLUE CROSS/BLUE SHIELD | Attending: Emergency Medicine | Admitting: Emergency Medicine

## 2015-08-07 VITALS — BP 116/75 | HR 73 | Resp 17

## 2015-08-07 DIAGNOSIS — Z853 Personal history of malignant neoplasm of breast: Secondary | ICD-10-CM | POA: Diagnosis not present

## 2015-08-07 DIAGNOSIS — Z79899 Other long term (current) drug therapy: Secondary | ICD-10-CM | POA: Diagnosis not present

## 2015-08-07 DIAGNOSIS — G8929 Other chronic pain: Secondary | ICD-10-CM | POA: Diagnosis not present

## 2015-08-07 DIAGNOSIS — T451X5A Adverse effect of antineoplastic and immunosuppressive drugs, initial encounter: Secondary | ICD-10-CM | POA: Diagnosis not present

## 2015-08-07 DIAGNOSIS — S134XXA Sprain of ligaments of cervical spine, initial encounter: Secondary | ICD-10-CM | POA: Insufficient documentation

## 2015-08-07 DIAGNOSIS — M545 Low back pain, unspecified: Secondary | ICD-10-CM

## 2015-08-07 DIAGNOSIS — Y9389 Activity, other specified: Secondary | ICD-10-CM | POA: Diagnosis not present

## 2015-08-07 DIAGNOSIS — M5416 Radiculopathy, lumbar region: Secondary | ICD-10-CM

## 2015-08-07 DIAGNOSIS — E119 Type 2 diabetes mellitus without complications: Secondary | ICD-10-CM | POA: Diagnosis not present

## 2015-08-07 DIAGNOSIS — G620 Drug-induced polyneuropathy: Secondary | ICD-10-CM | POA: Diagnosis not present

## 2015-08-07 DIAGNOSIS — Y92410 Unspecified street and highway as the place of occurrence of the external cause: Secondary | ICD-10-CM | POA: Diagnosis not present

## 2015-08-07 DIAGNOSIS — S3992XA Unspecified injury of lower back, initial encounter: Secondary | ICD-10-CM | POA: Diagnosis not present

## 2015-08-07 DIAGNOSIS — I890 Lymphedema, not elsewhere classified: Secondary | ICD-10-CM | POA: Insufficient documentation

## 2015-08-07 DIAGNOSIS — Z9221 Personal history of antineoplastic chemotherapy: Secondary | ICD-10-CM | POA: Insufficient documentation

## 2015-08-07 DIAGNOSIS — Y999 Unspecified external cause status: Secondary | ICD-10-CM | POA: Insufficient documentation

## 2015-08-07 DIAGNOSIS — S161XXA Strain of muscle, fascia and tendon at neck level, initial encounter: Secondary | ICD-10-CM | POA: Diagnosis not present

## 2015-08-07 DIAGNOSIS — M5489 Other dorsalgia: Secondary | ICD-10-CM | POA: Diagnosis not present

## 2015-08-07 DIAGNOSIS — S299XXA Unspecified injury of thorax, initial encounter: Secondary | ICD-10-CM | POA: Diagnosis not present

## 2015-08-07 DIAGNOSIS — R52 Pain, unspecified: Secondary | ICD-10-CM | POA: Diagnosis not present

## 2015-08-07 DIAGNOSIS — J45909 Unspecified asthma, uncomplicated: Secondary | ICD-10-CM | POA: Diagnosis not present

## 2015-08-07 DIAGNOSIS — S199XXA Unspecified injury of neck, initial encounter: Secondary | ICD-10-CM | POA: Diagnosis not present

## 2015-08-07 DIAGNOSIS — R079 Chest pain, unspecified: Secondary | ICD-10-CM | POA: Insufficient documentation

## 2015-08-07 DIAGNOSIS — M47816 Spondylosis without myelopathy or radiculopathy, lumbar region: Secondary | ICD-10-CM | POA: Diagnosis not present

## 2015-08-07 DIAGNOSIS — Z794 Long term (current) use of insulin: Secondary | ICD-10-CM | POA: Insufficient documentation

## 2015-08-07 DIAGNOSIS — M542 Cervicalgia: Secondary | ICD-10-CM | POA: Diagnosis not present

## 2015-08-07 MED ORDER — OXYCODONE-ACETAMINOPHEN 5-325 MG PO TABS
2.0000 | ORAL_TABLET | Freq: Once | ORAL | Status: AC
  Administered 2015-08-07: 2 via ORAL
  Filled 2015-08-07: qty 2

## 2015-08-07 MED ORDER — MELOXICAM 7.5 MG PO TABS: 7.5000 mg | ORAL_TABLET | Freq: Every day | ORAL | Status: DC

## 2015-08-07 MED ORDER — FENTANYL 12 MCG/HR TD PT72: 12.5000 ug | MEDICATED_PATCH | TRANSDERMAL | Status: DC

## 2015-08-07 MED ORDER — METHOCARBAMOL 500 MG PO TABS: 500.0000 mg | ORAL_TABLET | Freq: Four times a day (QID) | ORAL | Status: AC

## 2015-08-07 MED ORDER — KETOROLAC TROMETHAMINE 30 MG/ML IJ SOLN
30.0000 mg | Freq: Once | INTRAMUSCULAR | Status: AC
  Administered 2015-08-07: 30 mg via INTRAMUSCULAR
  Filled 2015-08-07: qty 1

## 2015-08-07 MED ORDER — OXYCODONE-ACETAMINOPHEN 10-325 MG PO TABS: 1.0000 | ORAL_TABLET | Freq: Three times a day (TID) | ORAL | Status: DC | PRN
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
## 2015-08-07 NOTE — ED Notes (Signed)
Placed neck collar on pt
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
## 2015-08-07 NOTE — Progress Notes (Signed)
Subjective:    Patient ID: Krista Doyle, female    DOB: 03-Dec-1960, 55 y.o.   MRN: [id number]  HPI: [name] is a 55 year old female who returns for follow up for chronic pain and medication refill. She states her pain is located in her lower back radiating into her right lower extremity anteriorly. She rates her pain 5. Her current exercise regime is walking . Also states she was married on 07/11/15. Krista Doyle forgot her Medications, Pharmacy was called, she picked up her Fentanyl patches on  07/21/2015 and Oxycodone on 07/22/15. Reviewed Narcotic Policy she verbalizes understanding.  Pain Inventory Average Pain 6 Pain Right Now 5 My pain is NA  In the last 24 hours, has pain interfered with the following? General activity 6 Relation with others 6 Enjoyment of life 6 What TIME of day is your pain at its worst? evening Sleep (in general) NA  Pain is worse with: walking, bending, sitting and standing Pain improves with: NA Relief from Meds: NA  Mobility walk without assistance use a cane use a walker  Function disabled: date disabled NA  Neuro/Psych numbness tingling  Prior Studies Any changes since last visit?  no  Physicians involved in your care Primary care .   Family History  Problem Relation Age of Onset  . Colon cancer Neg Hx    Social History   Social History  . Marital Status: Married    Spouse Name: N/A  . Number of Children: N/A  . Years of Education: N/A   Occupational History  . Disabled    Social History Main Topics  . Smoking status: Never Smoker   . Smokeless tobacco: None  . Alcohol Use: No  . Drug Use: No  . Sexual Activity: Yes   Other Topics Concern  . None   Social History Narrative   Past Surgical History  Procedure Laterality Date  . Breast lumpectomy    . Tonsillectomy    . Knee surgery      right  . Back surgery    . Colonoscopy      about 3 yrs ago in [place]   Past Medical  History  Diagnosis Date  . Breast cancer ([place]) 2008    left  . Back pain   . Neuropathy ([place])     extremities after chemo  . Diabetes ([place])   . Hypercholesterolemia   . Asthma    BP 116/75 mmHg  Pulse 73  Resp 17  SpO2   Opioid Risk Score:   Fall Risk Score:  `1  Depression screen PHQ 2/9  Depression screen [facility] 2/9 07/06/2015 04/28/2015 02/11/2015  Decreased Interest 0 1 1  Down, Depressed, Hopeless 0 0 0  PHQ - 2 Score 0 1 1  Altered sleeping - - 3  Tired, decreased energy - - 0  Change in appetite - - 0  Feeling bad or failure about yourself  - - 0  Trouble concentrating - - 0  Moving slowly or fidgety/restless - - 0  Suicidal thoughts - - 0  PHQ-9 Score - - 4  Difficult doing work/chores - - Somewhat difficult      Review of Systems  Constitutional: Positive for unexpected weight change.  Cardiovascular:       Limb swelling  Neurological: Positive for numbness.       Tingling   All other systems reviewed and are negative.      Objective:   Physical Exam  Constitutional:  She is oriented to person, place, and time. She appears well-developed and well-nourished.  HENT:  Head: Normocephalic and atraumatic.  Neck: Normal range of motion. Neck supple.  Cardiovascular: Normal rate and regular rhythm.   Pulmonary/Chest: Effort normal and breath sounds normal.  Musculoskeletal:  Normal Muscle Bulk and Muscle Testing Reveals:  Upper Extremities: Right: Full ROM and Muscle Strength 5/5 Left: Decreased ROM 45 Degrees and Muscle Strength 3/5 Lumbar Paraspinal Tenderness: L-3- L-5 Lower Extremities: Full ROM and Muscle Strength 5/5 Right Lower Extremity Flexion Produces Pain into extremity Arises from table slowly Narrow Based Gait  Neurological: She is alert and oriented to person, place, and time.  Skin: Skin is warm and dry.  Psychiatric: She has a normal mood and affect.  Nursing note and vitals reviewed.         Assessment & Plan:  1. Chronic low  back pain, lumbar spondylosis, spondylolisthesis, with lumbarl radiculopathy. Refilled: Fentanyl 12 mcg one patch every three days #10 and Oxycodone 10/325 mg one tablet every 8 hours as needed for pain #90. Oxycodone script post dated to be filled on 07/20/15. We will continue the opioid monitoring program, this consists of regular clinic visits, examinations, urine drug screen, pill counts as well as use of [place] Controlled Substance reporting System. 2. Chemotherapy induced peripheral neuropathy: Continue Lyrica at 150mg  TID   20 minutes of face to face patient care time was spent during this visit. All questions were encouraged and answered.  F/U in 1 month

## 2015-08-10 ENCOUNTER — Encounter (HOSPITAL_COMMUNITY): Payer: BLUE CROSS/BLUE SHIELD | Admitting: Physical Therapy

## 2015-08-12 ENCOUNTER — Encounter (HOSPITAL_COMMUNITY): Payer: BLUE CROSS/BLUE SHIELD | Admitting: Physical Therapy

## 2015-08-17 ENCOUNTER — Encounter (HOSPITAL_COMMUNITY): Payer: BLUE CROSS/BLUE SHIELD | Admitting: Physical Therapy

## 2015-08-17 DIAGNOSIS — M549 Dorsalgia, unspecified: Secondary | ICD-10-CM | POA: Diagnosis not present

## 2015-08-17 DIAGNOSIS — M542 Cervicalgia: Secondary | ICD-10-CM | POA: Diagnosis not present

## 2015-08-19 ENCOUNTER — Encounter (HOSPITAL_COMMUNITY): Payer: BLUE CROSS/BLUE SHIELD | Admitting: Physical Therapy

## 2015-08-21 ENCOUNTER — Encounter (HOSPITAL_COMMUNITY): Payer: BLUE CROSS/BLUE SHIELD | Admitting: Physical Therapy

## 2015-08-31 ENCOUNTER — Encounter: Payer: Self-pay | Admitting: Registered Nurse

## 2015-08-31 ENCOUNTER — Encounter (HOSPITAL_BASED_OUTPATIENT_CLINIC_OR_DEPARTMENT_OTHER): Payer: BLUE CROSS/BLUE SHIELD | Admitting: Registered Nurse

## 2015-08-31 VITALS — BP 106/75 | HR 98 | Temp 98.4°F

## 2015-08-31 DIAGNOSIS — R202 Paresthesia of skin: Secondary | ICD-10-CM | POA: Diagnosis not present

## 2015-08-31 DIAGNOSIS — M5416 Radiculopathy, lumbar region: Secondary | ICD-10-CM

## 2015-08-31 DIAGNOSIS — M545 Low back pain: Secondary | ICD-10-CM | POA: Diagnosis not present

## 2015-08-31 DIAGNOSIS — Z79899 Other long term (current) drug therapy: Secondary | ICD-10-CM

## 2015-08-31 DIAGNOSIS — Z853 Personal history of malignant neoplasm of breast: Secondary | ICD-10-CM | POA: Diagnosis not present

## 2015-08-31 DIAGNOSIS — Z9221 Personal history of antineoplastic chemotherapy: Secondary | ICD-10-CM | POA: Diagnosis not present

## 2015-08-31 DIAGNOSIS — G8929 Other chronic pain: Secondary | ICD-10-CM | POA: Diagnosis not present

## 2015-08-31 DIAGNOSIS — G620 Drug-induced polyneuropathy: Secondary | ICD-10-CM | POA: Diagnosis not present

## 2015-08-31 DIAGNOSIS — Z5181 Encounter for therapeutic drug level monitoring: Secondary | ICD-10-CM

## 2015-08-31 DIAGNOSIS — M546 Pain in thoracic spine: Secondary | ICD-10-CM | POA: Diagnosis not present

## 2015-08-31 DIAGNOSIS — M542 Cervicalgia: Secondary | ICD-10-CM | POA: Diagnosis not present

## 2015-08-31 DIAGNOSIS — M47816 Spondylosis without myelopathy or radiculopathy, lumbar region: Secondary | ICD-10-CM | POA: Diagnosis not present

## 2015-08-31 DIAGNOSIS — T451X5A Adverse effect of antineoplastic and immunosuppressive drugs, initial encounter: Secondary | ICD-10-CM

## 2015-08-31 DIAGNOSIS — I890 Lymphedema, not elsewhere classified: Secondary | ICD-10-CM | POA: Diagnosis not present

## 2015-08-31 DIAGNOSIS — M791 Myalgia: Secondary | ICD-10-CM | POA: Diagnosis not present

## 2015-08-31 MED ORDER — FENTANYL 12 MCG/HR TD PT72: 12.5000 ug | MEDICATED_PATCH | TRANSDERMAL | 0 refills | Status: DC

## 2015-08-31 MED ORDER — OXYCODONE-ACETAMINOPHEN 10-325 MG PO TABS: 1.0000 | ORAL_TABLET | Freq: Three times a day (TID) | ORAL | 0 refills | Status: DC | PRN
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
## 2015-09-02 DIAGNOSIS — J4520 Mild intermittent asthma, uncomplicated: Secondary | ICD-10-CM | POA: Diagnosis not present

## 2015-09-02 DIAGNOSIS — E119 Type 2 diabetes mellitus without complications: Secondary | ICD-10-CM | POA: Diagnosis not present

## 2015-09-02 DIAGNOSIS — I10 Essential (primary) hypertension: Secondary | ICD-10-CM | POA: Diagnosis not present

## 2015-09-02 DIAGNOSIS — G629 Polyneuropathy, unspecified: Secondary | ICD-10-CM | POA: Diagnosis not present

## 2015-09-03 DIAGNOSIS — M791 Myalgia: Secondary | ICD-10-CM | POA: Diagnosis not present

## 2015-09-03 DIAGNOSIS — M545 Low back pain: Secondary | ICD-10-CM | POA: Diagnosis not present

## 2015-09-03 DIAGNOSIS — R202 Paresthesia of skin: Secondary | ICD-10-CM | POA: Diagnosis not present

## 2015-09-03 DIAGNOSIS — M542 Cervicalgia: Secondary | ICD-10-CM | POA: Diagnosis not present

## 2015-09-04 ENCOUNTER — Ambulatory Visit: Payer: BLUE CROSS/BLUE SHIELD | Admitting: Registered Nurse

## 2015-09-09 ENCOUNTER — Other Ambulatory Visit (HOSPITAL_COMMUNITY): Payer: BLUE CROSS/BLUE SHIELD

## 2015-09-09 ENCOUNTER — Other Ambulatory Visit (HOSPITAL_COMMUNITY): Payer: Self-pay

## 2015-09-09 DIAGNOSIS — C50412 Malignant neoplasm of upper-outer quadrant of left female breast: Secondary | ICD-10-CM

## 2015-09-10 ENCOUNTER — Encounter (HOSPITAL_COMMUNITY): Payer: BLUE CROSS/BLUE SHIELD

## 2015-09-10 ENCOUNTER — Encounter (HOSPITAL_COMMUNITY): Payer: BLUE CROSS/BLUE SHIELD | Attending: Oncology

## 2015-09-10 VITALS — BP 127/85 | HR 88 | Temp 98.0°F

## 2015-09-10 DIAGNOSIS — C50412 Malignant neoplasm of upper-outer quadrant of left female breast: Secondary | ICD-10-CM

## 2015-09-10 DIAGNOSIS — Z79899 Other long term (current) drug therapy: Secondary | ICD-10-CM

## 2015-09-10 DIAGNOSIS — M8580 Other specified disorders of bone density and structure, unspecified site: Secondary | ICD-10-CM

## 2015-09-10 LAB — COMPREHENSIVE METABOLIC PANEL
ALT: 13 U/L — ABNORMAL LOW (ref 14–54)
AST: 18 U/L (ref 15–41)
Albumin: 3.7 g/dL (ref 3.5–5.0)
Alkaline Phosphatase: 41 U/L (ref 38–126)
Anion gap: 4 — ABNORMAL LOW (ref 5–15)
BUN: 14 mg/dL (ref 6–20)
CO2: 32 mmol/L (ref 22–32)
Calcium: 9.6 mg/dL (ref 8.9–10.3)
Chloride: 101 mmol/L (ref 101–111)
Creatinine, Ser: 0.7 mg/dL (ref 0.44–1.00)
GFR calc Af Amer: >60 mL/min (ref >60)
GFR calc non Af Amer: >60 mL/min (ref >60)
Glucose, Bld: 108 mg/dL — ABNORMAL HIGH (ref 65–99)
Potassium: 3.8 mmol/L (ref 3.5–5.1)
Sodium: 137 mmol/L (ref 135–145)
Total Bilirubin: 0.3 mg/dL (ref 0.3–1.2)
Total Protein: 7.8 g/dL (ref 6.5–8.1)

## 2015-09-10 LAB — CBC WITH DIFFERENTIAL/PLATELET
Basophils Absolute: 0 10*3/uL (ref 0.0–0.1)
Basophils Relative: 1 %
Eosinophils Absolute: 0.1 10*3/uL (ref 0.0–0.7)
Eosinophils Relative: 2 %
HCT: 41 % (ref 36.0–46.0)
Hemoglobin: 13.2 g/dL (ref 12.0–15.0)
Lymphocytes Relative: 37 %
Lymphs Abs: 2.4 10*3/uL (ref 0.7–4.0)
MCH: 26.2 pg (ref 26.0–34.0)
MCHC: 32.2 g/dL (ref 30.0–36.0)
MCV: 81.5 fL (ref 78.0–100.0)
Monocytes Absolute: 0.5 10*3/uL (ref 0.1–1.0)
Monocytes Relative: 8 %
Neutro Abs: 3.4 10*3/uL (ref 1.7–7.7)
Neutrophils Relative %: 52 %
Platelets: 272 10*3/uL (ref 150–400)
RBC: 5.03 MIL/uL (ref 3.87–5.11)
RDW: 13.8 % (ref 11.5–15.5)
WBC: 6.5 10*3/uL (ref 4.0–10.5)

## 2015-09-10 MED ORDER — DENOSUMAB 60 MG/ML ~~LOC~~ SOLN
60.0000 mg | Freq: Once | SUBCUTANEOUS | Status: AC
  Administered 2015-09-10: 60 mg via SUBCUTANEOUS
  Filled 2015-09-10: qty 1

## 2015-09-10 NOTE — Progress Notes (Signed)
Krista Doyle presents today for injection per MD orders. Prolia administered SQ in right Upper Arm per patient request because she administers insulin in her abdomen multiple times daily. Administration without incident. Patient tolerated well. Patient reports that she is taking calcium daily as directed. Labs printed for patient and follow up appointment made.

## 2015-09-10 NOTE — Patient Instructions (Signed)
[facility] Discharge Instructions  RECOMMENDATIONS MADE BY THE CONSULTANT AND ANY TEST RESULTS WILL BE SENT TO YOUR REFERRING PHYSICIAN.  Prolia today.    Thank you for choosing  Can[facility] Center at [facility] to provide your oncology and hematology care.  To afford each patient quality time with our provider, please arrive at least 15 minutes before your scheduled appointment time.   Beginning January 23rd 2017 lab work for the Cancer Center will be done in the  Main lab at [facility] on 1st floor. If you have a lab appointment with the Cancer Center please come in thru the  Main Entrance and check in at the main information desk  You need to re-schedule your appointment should you arrive 10 or more minutes late.  We strive to give you quality time with our providers, and arriving late affects you and other patients whose appointments are after yours.  Also, if you no show three or more times for appointments you may be dismissed from the clinic at the providers discretion.     Again, thank you for choosing [facility].  Our hope is that these requests will decrease the amount of time that you wait before being seen by our physicians.       _____________________________________________________________  Should you have questions after your visit to [facility], please contact our office at [phone] between the hours of 8:30 a.m. and 4:30 p.m.  Voicemails left after 4:30 p.m. will not be returned until the following business day.  For prescription refill requests, have your pharmacy contact our office.         Resources For Cancer Patients and their Caregivers ? American Cancer Society: Can assist with transportation, wigs, general needs, runs Look Good Feel Better.        1-888-227-6333 ? Cancer Care: Provides financial assistance, online support groups, medication/co-pay assistance.  1-800-813-HOPE  (4673) ? [facility] Assists [place] cancer patients and their families through emotional , educational and financial support.  336-427-4357 ? [place] DSS Where to apply for food stamps, Medicaid and utility assistance. 336-342-1394 ? [facility]: Transportation to medical appointments. 336-347-2287 ? Social Security Administration: May apply for disability if have a Stage IV cancer. 336-342-7796 1-800-772-1213 ? [place] Aging, Disability and Transit Services: Assists with nutrition, care and transit needs. 336-349-2343  Cancer Center Support Programs: @[email or web address]@ > Cancer Support Group  2nd Tuesday of the month 1pm-2pm, Journey Room  > Creative Journey  3rd Tuesday of the month 1130am-1pm, Journey Room  > Look Good Feel Better  1st Wednesday of the month 10am-12 noon, Journey Room (Call American Cancer Society to register 1-800-395-5775)    

## 2015-10-01 ENCOUNTER — Encounter: Payer: Self-pay | Admitting: Gastroenterology

## 2015-10-06 DIAGNOSIS — Z23 Encounter for immunization: Secondary | ICD-10-CM | POA: Diagnosis not present

## 2015-10-07 ENCOUNTER — Encounter
Payer: BLUE CROSS/BLUE SHIELD | Attending: Physical Medicine & Rehabilitation | Admitting: Physical Medicine & Rehabilitation

## 2015-10-07 ENCOUNTER — Encounter: Payer: Self-pay | Admitting: Physical Medicine & Rehabilitation

## 2015-10-07 VITALS — BP 148/92 | HR 88 | Resp 14

## 2015-10-07 DIAGNOSIS — Z79899 Other long term (current) drug therapy: Secondary | ICD-10-CM

## 2015-10-07 DIAGNOSIS — T451X5A Adverse effect of antineoplastic and immunosuppressive drugs, initial encounter: Secondary | ICD-10-CM

## 2015-10-07 DIAGNOSIS — Z853 Personal history of malignant neoplasm of breast: Secondary | ICD-10-CM | POA: Insufficient documentation

## 2015-10-07 DIAGNOSIS — M47816 Spondylosis without myelopathy or radiculopathy, lumbar region: Secondary | ICD-10-CM | POA: Diagnosis not present

## 2015-10-07 DIAGNOSIS — G620 Drug-induced polyneuropathy: Secondary | ICD-10-CM | POA: Diagnosis not present

## 2015-10-07 DIAGNOSIS — M545 Low back pain: Secondary | ICD-10-CM | POA: Diagnosis not present

## 2015-10-07 DIAGNOSIS — M5416 Radiculopathy, lumbar region: Secondary | ICD-10-CM

## 2015-10-07 DIAGNOSIS — G8929 Other chronic pain: Secondary | ICD-10-CM | POA: Diagnosis not present

## 2015-10-07 DIAGNOSIS — I890 Lymphedema, not elsewhere classified: Secondary | ICD-10-CM | POA: Diagnosis not present

## 2015-10-07 DIAGNOSIS — G894 Chronic pain syndrome: Secondary | ICD-10-CM

## 2015-10-07 DIAGNOSIS — Z9221 Personal history of antineoplastic chemotherapy: Secondary | ICD-10-CM | POA: Diagnosis not present

## 2015-10-07 DIAGNOSIS — Z5181 Encounter for therapeutic drug level monitoring: Secondary | ICD-10-CM | POA: Diagnosis not present

## 2015-10-07 MED ORDER — FENTANYL 12 MCG/HR TD PT72: 12.5000 ug | MEDICATED_PATCH | TRANSDERMAL | 0 refills | Status: DC

## 2015-10-07 MED ORDER — OXYCODONE-ACETAMINOPHEN 10-325 MG PO TABS: 1.0000 | ORAL_TABLET | Freq: Three times a day (TID) | ORAL | 0 refills | Status: DC | PRN
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
## 2015-10-07 NOTE — Patient Instructions (Signed)
CONTINUE WITH PT AS YOU ARE.

## 2015-10-08 DIAGNOSIS — I890 Lymphedema, not elsewhere classified: Secondary | ICD-10-CM | POA: Diagnosis not present

## 2015-10-16 LAB — TOXASSURE SELECT,+ANTIDEPR,UR

## 2015-10-21 NOTE — Progress Notes (Signed)
Urine drug screen for this encounter is consistent for prescribed medications.   

## 2015-11-02 ENCOUNTER — Encounter: Payer: Self-pay | Admitting: Physical Medicine & Rehabilitation

## 2015-11-02 ENCOUNTER — Encounter: Payer: BLUE CROSS/BLUE SHIELD | Attending: Physical Medicine & Rehabilitation

## 2015-11-02 ENCOUNTER — Ambulatory Visit (HOSPITAL_BASED_OUTPATIENT_CLINIC_OR_DEPARTMENT_OTHER): Payer: BLUE CROSS/BLUE SHIELD | Admitting: Physical Medicine & Rehabilitation

## 2015-11-02 DIAGNOSIS — M4712 Other spondylosis with myelopathy, cervical region: Secondary | ICD-10-CM

## 2015-11-02 DIAGNOSIS — I890 Lymphedema, not elsewhere classified: Secondary | ICD-10-CM | POA: Diagnosis not present

## 2015-11-02 DIAGNOSIS — Z9221 Personal history of antineoplastic chemotherapy: Secondary | ICD-10-CM | POA: Diagnosis not present

## 2015-11-02 DIAGNOSIS — G620 Drug-induced polyneuropathy: Secondary | ICD-10-CM | POA: Insufficient documentation

## 2015-11-02 DIAGNOSIS — M47812 Spondylosis without myelopathy or radiculopathy, cervical region: Secondary | ICD-10-CM | POA: Diagnosis not present

## 2015-11-02 DIAGNOSIS — G8929 Other chronic pain: Secondary | ICD-10-CM | POA: Insufficient documentation

## 2015-11-02 DIAGNOSIS — Z853 Personal history of malignant neoplasm of breast: Secondary | ICD-10-CM | POA: Insufficient documentation

## 2015-11-02 DIAGNOSIS — M545 Low back pain: Secondary | ICD-10-CM | POA: Diagnosis not present

## 2015-11-02 DIAGNOSIS — M47816 Spondylosis without myelopathy or radiculopathy, lumbar region: Secondary | ICD-10-CM | POA: Insufficient documentation

## 2015-11-02 NOTE — Progress Notes (Signed)
  PROCEDURE RECORD [facility]   Name: Krista Doyle DOB:[birth year] MRN: [id number]  Date:11/02/2015  Physician: Alysia Penna, MD    Nurse/CMA: Anilah Huck, CMA  Allergies: No Known Allergies  Consent Signed: Yes.    Is patient diabetic? Yes.    CBG today? 126  Pregnant: No. LMP: No LMP recorded. Patient has had a hysterectomy. (age [age])  Anticoagulants: no Anti-inflammatory: no Antibiotics: no  Procedure: bilateral medial branch block  Position: Prone Start Time: 11:00am  End Time: 11:12am  Fluoro Time: 33  RN/CMA Quitman Norberto, CMA Fabiola Mudgett, CMA    Time 10:35am 11:20am    BP 124/85 133/90    Pulse 90 86    Respirations 14 14    O2 Sat 97 98    S/S 6 6    Pain Level 7/10 7/10     D/C home with husband, patient A & O X 3, D/C instructions reviewed, and sits independently.

## 2015-11-02 NOTE — Progress Notes (Signed)
Bilateral Lumbar L3, L4  medial branch blocks and L 5 dorsal ramus injection under fluoroscopic guidance  Indication: Lumbar pain which is not relieved by medication management or other conservative care and interfering with self-care and mobility.  Informed consent was obtained after describing risks and benefits of the procedure with the patient, this includes bleeding, infection, paralysis and medication side effects.  The patient wishes to proceed and has given written consent.  The patient was placed in prone position.  The lumbar area was marked and prepped with Betadine.  One mL of 1% lidocaine was injected into each of 6 areas into the skin and subcutaneous tissue.  Then a 22-gauge 3.5in spinal needle was inserted targeting the junction of the left S1 superior articular process and sacral ala junction. Needle was advanced under fluoroscopic guidance.  Bone contact was made.  Omnipaque 180 was injected x 0.5 mL demonstrating no intravascular uptake.  Then a solution containing one mL of 4 mg per mL dexamethasone and 3 mL of 2% MPF lidocaine was injected x 0.5 mL.  Then the left L5 superior articular process in transverse process junction was targeted.  Bone contact was made.  Omnipaque 180 was injected x 0.5 mL demonstrating no intravascular uptake. Then a solution containing one mL of 4 mg per mL dexamethasone and 3 mL of 2% MPF lidocaine was injected x 0.5 mL.  Then the left L4 superior articular process in transverse process junction was targeted.  Bone contact was made.  Omnipaque 180 was injected x 0.5 mL demonstrating no intravascular uptake.  Then a solution containing one mL of 4 mg per mL dexamethasone and 3 mL if 2% MPF lidocaine was injected x 0.5 mL.  This same procedure was performed on the right side using the same needle, technique and injectate.  Patient tolerated procedure well.  Post procedure instructions were given.

## 2015-11-02 NOTE — Patient Instructions (Signed)

## 2015-11-05 ENCOUNTER — Ambulatory Visit: Payer: BLUE CROSS/BLUE SHIELD | Admitting: Physical Medicine & Rehabilitation

## 2015-11-23 ENCOUNTER — Encounter: Payer: BLUE CROSS/BLUE SHIELD | Admitting: Registered Nurse

## 2015-11-30 ENCOUNTER — Encounter: Payer: BLUE CROSS/BLUE SHIELD | Admitting: Registered Nurse

## 2015-12-01 ENCOUNTER — Encounter: Payer: Self-pay | Admitting: Registered Nurse

## 2015-12-01 ENCOUNTER — Encounter (HOSPITAL_BASED_OUTPATIENT_CLINIC_OR_DEPARTMENT_OTHER): Payer: BLUE CROSS/BLUE SHIELD | Admitting: Registered Nurse

## 2015-12-01 ENCOUNTER — Encounter (INDEPENDENT_AMBULATORY_CARE_PROVIDER_SITE_OTHER): Payer: Self-pay

## 2015-12-01 VITALS — BP 124/83 | HR 92 | Resp 14

## 2015-12-01 DIAGNOSIS — G620 Drug-induced polyneuropathy: Secondary | ICD-10-CM | POA: Diagnosis not present

## 2015-12-01 DIAGNOSIS — M47812 Spondylosis without myelopathy or radiculopathy, cervical region: Secondary | ICD-10-CM | POA: Diagnosis not present

## 2015-12-01 DIAGNOSIS — M47816 Spondylosis without myelopathy or radiculopathy, lumbar region: Secondary | ICD-10-CM | POA: Diagnosis not present

## 2015-12-01 DIAGNOSIS — M545 Low back pain: Secondary | ICD-10-CM | POA: Diagnosis not present

## 2015-12-01 DIAGNOSIS — T451X5A Adverse effect of antineoplastic and immunosuppressive drugs, initial encounter: Secondary | ICD-10-CM

## 2015-12-01 DIAGNOSIS — G8929 Other chronic pain: Secondary | ICD-10-CM | POA: Diagnosis not present

## 2015-12-01 DIAGNOSIS — M5416 Radiculopathy, lumbar region: Secondary | ICD-10-CM | POA: Diagnosis not present

## 2015-12-01 DIAGNOSIS — Z9221 Personal history of antineoplastic chemotherapy: Secondary | ICD-10-CM | POA: Diagnosis not present

## 2015-12-01 DIAGNOSIS — I890 Lymphedema, not elsewhere classified: Secondary | ICD-10-CM | POA: Diagnosis not present

## 2015-12-01 DIAGNOSIS — Z5181 Encounter for therapeutic drug level monitoring: Secondary | ICD-10-CM

## 2015-12-01 DIAGNOSIS — G894 Chronic pain syndrome: Secondary | ICD-10-CM

## 2015-12-01 DIAGNOSIS — Z853 Personal history of malignant neoplasm of breast: Secondary | ICD-10-CM | POA: Diagnosis not present

## 2015-12-01 MED ORDER — FENTANYL 12 MCG/HR TD PT72: 12.5000 ug | MEDICATED_PATCH | TRANSDERMAL | 0 refills | Status: DC

## 2015-12-01 MED ORDER — OXYCODONE-ACETAMINOPHEN 10-325 MG PO TABS: 1.0000 | ORAL_TABLET | Freq: Three times a day (TID) | ORAL | 0 refills | Status: DC | PRN

## 2015-12-01 NOTE — Progress Notes (Signed)
Subjective:    Patient ID: Krista Doyle, female    DOB: 1960/12/19, 55 y.o.   MRN: [id number]  HPI: [name] is a 55 year old female who returns for follow up for chronic pain and medication refill. She states her pain is located in her neck, mid-lower back radiating into her right lower extremity anteriorly. She rates her pain 7. Her current exercise regime is attending physical therapy two days a week, performing stretching exercises and walking.  S/P Lumbar MBB with no relief noted she states.    Pain Inventory Average Pain 8 Pain Right Now 7 My pain is constant and stabbing  In the last 24 hours, has pain interfered with the following? General activity 8 Relation with others 7 Enjoyment of life 10 What TIME of day is your pain at its worst? evening, night Sleep (in general) Poor  Pain is worse with: walking, bending, sitting and standing Pain improves with: heat/ice, medication and TENS Relief from Meds: 5  Mobility walk with assistance use a cane use a walker how many minutes can you walk? 5-10 minutes  ability to climb steps?  yes do you drive?  yes  Function disabled: date disabled n/a  Neuro/Psych numbness tingling spasms  Prior Studies Any changes since last visit?  no  Physicians involved in your care Any changes since last visit?  no   Family History  Problem Relation Age of Onset  . Colon cancer Neg Hx    Social History   Social History  . Marital status: Married    Spouse name: N/A  . Number of children: N/A  . Years of education: N/A   Occupational History  . Disabled    Social History Main Topics  . Smoking status: Never Smoker  . Smokeless tobacco: Never Used  . Alcohol use No  . Drug use: No  . Sexual activity: Yes   Other Topics Concern  . None   Social History Narrative  . None   Past Surgical History:  Procedure Laterality Date  . BACK SURGERY    . BREAST LUMPECTOMY    . COLONOSCOPY     about 3 yrs ago in [place]  . KNEE SURGERY     right  . TONSILLECTOMY     Past Medical History:  Diagnosis Date  . Asthma   . Back pain   . Breast cancer ([place]) 2008   left  . Diabetes ([place])   . Hypercholesterolemia   . Neuropathy (HCC)    extremities after chemo   BP 124/83   Pulse 92   Resp 14   SpO2 98%   Opioid Risk Score:   Fall Risk Score:  `1  Depression screen PHQ 2/9  Depression screen [facility] 2/9 10/07/2015 08/31/2015 07/06/2015 04/28/2015 02/11/2015  Decreased Interest 0 1 0 1 1  Down, Depressed, Hopeless 0 0 0 0 0  PHQ - 2 Score 0 1 0 1 1  Altered sleeping - - - - 3  Tired, decreased energy - - - - 0  Change in appetite - - - - 0  Feeling bad or failure about yourself  - - - - 0  Trouble concentrating - - - - 0  Moving slowly or fidgety/restless - - - - 0  Suicidal thoughts - - - - 0  PHQ-9 Score - - - - 4  Difficult doing work/chores - - - - Somewhat difficult     Review of Systems  Constitutional: Positive  for unexpected weight change.  Respiratory: Positive for wheezing.   Gastrointestinal: Positive for constipation.  Musculoskeletal:       Limb swelling  All other systems reviewed and are negative.      Objective:   Physical Exam  Constitutional: She is oriented to person, place, and time. She appears well-developed and well-nourished.  HENT:  Head: Normocephalic and atraumatic.  Neck: Normal range of motion. Neck supple.  Cardiovascular: Normal rate and regular rhythm.   Pulmonary/Chest: Effort normal and breath sounds normal.  Musculoskeletal:  Normal Muscle Bulk and Muscle Testing Reveals: Upper Extremities: Full ROM and Muscle Strength 5/5 Lumbar Hypersensitivity Right Greater Trochanteric Tenderness:  Lower Extremities: Right: Decreased ROM and Muscle Strength 4/5 Right Lower Extremity Flexion Produces Pain into Lumbar  Left: Full ROM and Muscle Strength 5/5 Arises from Table slowly using straight cane Narrow Based Gait       Neurological: She is alert and oriented to person, place, and time.  Skin: Skin is warm and dry.  Psychiatric: She has a normal mood and affect.  Nursing note and vitals reviewed.         Assessment & Plan:  1. Chronic low back pain, lumbar spondylosis, spondylolisthesis, with lumbar radiculopathy. Refilled: Fentanyl 12 mcg one patch every three days #10 and Oxycodone 10/325 mg one tablet every 8 hours as needed for pain #90.  We will continue the opioid monitoring program, this consists of regular clinic visits, examinations, urine drug screen, pill counts as well as use of [place] Controlled Substance reporting System. 2. Chemotherapy induced peripheral neuropathy: Continue Lyrica at 150mg  TID   20 minutes of face to face patient care time was spent during this visit. All questions were encouraged and answered.   F/U in 1 month

## 2015-12-09 ENCOUNTER — Encounter: Payer: Self-pay | Admitting: Gastroenterology

## 2015-12-09 ENCOUNTER — Other Ambulatory Visit: Payer: Self-pay

## 2015-12-09 ENCOUNTER — Ambulatory Visit (INDEPENDENT_AMBULATORY_CARE_PROVIDER_SITE_OTHER): Payer: BLUE CROSS/BLUE SHIELD | Admitting: Gastroenterology

## 2015-12-09 DIAGNOSIS — K5903 Drug induced constipation: Secondary | ICD-10-CM

## 2015-12-09 DIAGNOSIS — K838 Other specified diseases of biliary tract: Secondary | ICD-10-CM

## 2015-12-09 DIAGNOSIS — T402X5A Adverse effect of other opioids, initial encounter: Secondary | ICD-10-CM | POA: Diagnosis not present

## 2015-12-09 DIAGNOSIS — K839 Disease of biliary tract, unspecified: Secondary | ICD-10-CM | POA: Diagnosis not present

## 2015-12-09 NOTE — Progress Notes (Signed)
CC'D TO PCP °

## 2015-12-09 NOTE — Progress Notes (Signed)
cc'ed to pcp °

## 2015-12-09 NOTE — Assessment & Plan Note (Addendum)
NORMAL LIVER ENZYMES IN APR 2017. I PERSONALLY REVIEWED THE MRCP WITH DR. MAXWELL.  CHD DILATED BUT CBD TAPERS NORMALLY. PANCREATIC DUCT IS PROMINENT. HOP LOOKS NORMAL SUSPICION THAT ANY SIGNIFICANT ABNORMALITY IS LOW.  RECHECK HFP. EUS WITH DR. Paulita Fujita FOLLOW UP IN 6 MOS.

## 2015-12-09 NOTE — Patient Instructions (Addendum)
Complete labs and we will make referral for endoscopic ultrasound(EUS) within the next month.   DRINK WATER TO KEEP YOUR URINE LIGHT YELLOW.  FOLLOW A HIGH FIBER DIET. AVOID ITEMS THAT CAUSE BLOATING & GAS. SEE INFO BELOW.  CONTINUE MOVANTIK 1 HOUR PRIOR TO MEALS.  USE MILK OF MAGNESIA(MOM) PILLS OR LIQUID AT BEDTIME TO REDUCE CONSTIPATION AND HARD STOOLS. IF NO SATISFACTORY BOWEL MOVEMENT AFTER 2 WEEKS INCREASE MOM TO AT BEDTIME AND WITH BREAKFAST.  FOLLOW UP IN 6 MOS. MERRY CHRISTMAS AND HAPPY NEW YEAR!  High-Fiber Diet A high-fiber diet changes your normal diet to include more whole grains, legumes, fruits, and vegetables. Changes in the diet involve replacing refined carbohydrates with unrefined foods. The calorie level of the diet is essentially unchanged. The Dietary Reference Intake (recommended amount) for adult males is 38 grams per day. For adult females, it is 25 grams per day. Pregnant and lactating women should consume 28 grams of fiber per day. Fiber is the intact part of a plant that is not broken down during digestion. Functional fiber is fiber that has been isolated from the plant to provide a beneficial effect in the body. PURPOSE  Increase stool bulk.   Ease and regulate bowel movements.   Lower cholesterol.   REDUCE RISK OF COLON CANCER  INDICATIONS THAT YOU NEED MORE FIBER  Constipation and hemorrhoids.   Uncomplicated diverticulosis (intestine condition) and irritable bowel syndrome.   Weight management.   As a protective measure against hardening of the arteries (atherosclerosis), diabetes, and cancer.   GUIDELINES FOR INCREASING FIBER IN THE DIET  Start adding fiber to the diet slowly. A gradual increase of about 5 more grams (2 slices of whole-wheat bread, 2 servings of most fruits or vegetables, or 1 bowl of high-fiber cereal) per day is best. Too rapid an increase in fiber may result in constipation, flatulence, and bloating.   Drink enough water and  fluids to keep your urine clear or pale yellow. Water, juice, or caffeine-free drinks are recommended. Not drinking enough fluid may cause constipation.   Eat a variety of high-fiber foods rather than one type of fiber.   Try to increase your intake of fiber through using high-fiber foods rather than fiber pills or supplements that contain small amounts of fiber.   The goal is to change the types of food eaten. Do not supplement your present diet with high-fiber foods, but replace foods in your present diet.   INCLUDE A VARIETY OF FIBER SOURCES  Replace refined and processed grains with whole grains, canned fruits with fresh fruits, and incorporate other fiber sources. White rice, white breads, and most bakery goods contain little or no fiber.   Brown whole-grain rice, buckwheat oats, and many fruits and vegetables are all good sources of fiber. These include: broccoli, Brussels sprouts, cabbage, cauliflower, beets, sweet potatoes, white potatoes (skin on), carrots, tomatoes, eggplant, squash, berries, fresh fruits, and dried fruits.   Cereals appear to be the richest source of fiber. Cereal fiber is found in whole grains and bran. Bran is the fiber-rich outer coat of cereal grain, which is largely removed in refining. In whole-grain cereals, the bran remains. In breakfast cereals, the largest amount of fiber is found in those with "bran" in their names. The fiber content is sometimes indicated on the label.   You may need to include additional fruits and vegetables each day.   In baking, for 1 cup white flour, you may use the following substitutions:   1  cup whole-wheat flour minus 2 tablespoons.   1/2 cup white flour plus 1/2 cup whole-wheat flour.
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
## 2015-12-09 NOTE — Assessment & Plan Note (Signed)
SYMPTOMS NOT IDEALLY CONTROLLED ON MOVANTIK 25 MG DAILY.  DRINK WATER TO KEEP YOUR URINE LIGHT YELLOW. FOLLOW A HIGH FIBER DIET. AVOID ITEMS THAT CAUSE BLOATING & GAS. SEE INFO BELOW. CONTINUE MOVANTIK 1 HOUR PRIOR TO MEALS. TITRATE MOM. USE MILK OF MAGNESIA(MOM) PILLS OR LIQUID AT BEDTIME TO REDUCE CONSTIPATION AND HARD STOOLS. IF NO SATISFACTORY BOWEL MOVEMENT AFTER 2 WEEKS INCREASE MOM TO AT BEDTIME AND WITH BREAKFAST. CALL WITH QUESTIONS OR CONCERNS. FOLLOW UP IN 6 MOS.

## 2015-12-11 ENCOUNTER — Encounter (HOSPITAL_COMMUNITY): Payer: BLUE CROSS/BLUE SHIELD | Attending: Oncology | Admitting: Hematology & Oncology

## 2015-12-11 ENCOUNTER — Encounter (HOSPITAL_COMMUNITY): Payer: Self-pay | Admitting: Hematology & Oncology

## 2015-12-11 VITALS — BP 151/89 | HR 94 | Temp 98.5°F | Resp 16 | Wt 147.2 lb

## 2015-12-11 DIAGNOSIS — Z170 Estrogen receptor positive status [ER+]: Secondary | ICD-10-CM

## 2015-12-11 DIAGNOSIS — Z79811 Long term (current) use of aromatase inhibitors: Secondary | ICD-10-CM

## 2015-12-11 DIAGNOSIS — C50912 Malignant neoplasm of unspecified site of left female breast: Secondary | ICD-10-CM

## 2015-12-11 DIAGNOSIS — M545 Low back pain: Secondary | ICD-10-CM | POA: Diagnosis not present

## 2015-12-11 DIAGNOSIS — E559 Vitamin D deficiency, unspecified: Secondary | ICD-10-CM

## 2015-12-11 DIAGNOSIS — G8929 Other chronic pain: Secondary | ICD-10-CM | POA: Diagnosis not present

## 2015-12-11 DIAGNOSIS — M8580 Other specified disorders of bone density and structure, unspecified site: Secondary | ICD-10-CM

## 2015-12-11 DIAGNOSIS — C50412 Malignant neoplasm of upper-outer quadrant of left female breast: Secondary | ICD-10-CM

## 2015-12-11 DIAGNOSIS — Z1239 Encounter for other screening for malignant neoplasm of breast: Secondary | ICD-10-CM

## 2015-12-11 DIAGNOSIS — I890 Lymphedema, not elsewhere classified: Secondary | ICD-10-CM

## 2015-12-11 DIAGNOSIS — R51 Headache: Secondary | ICD-10-CM

## 2015-12-11 NOTE — Patient Instructions (Signed)
[place] at [facility] Discharge Instructions  RECOMMENDATIONS MADE BY THE CONSULTANT AND ANY TEST RESULTS WILL BE SENT TO YOUR REFERRING PHYSICIAN.  You saw Dr.Penland today. Follow up in 6 months with labs. Mammogram in January. Prolia in February. See Amy at checkout for appointments.  Thank you for choosing [place] at [facility] to provide your oncology and hematology care.  To afford each patient quality time with our provider, please arrive at least 15 minutes before your scheduled appointment time.   Beginning January 23rd 2017 lab work for the [facility] will be done in the  Main lab at [facility] on 1st floor. If you have a lab appointment with the [facility] please come in thru the  Main Entrance and check in at the main information desk  You need to re-schedule your appointment should you arrive 10 or more minutes late.  We strive to give you quality time with our providers, and arriving late affects you and other patients whose appointments are after yours.  Also, if you no show three or more times for appointments you may be dismissed from the clinic at the providers discretion.     Again, thank you for choosing [facility].  Our hope is that these requests will decrease the amount of time that you wait before being seen by our physicians.       _____________________________________________________________  Should you have questions after your visit to [facility], please contact our office at [phone] between the hours of 8:30 a.m. and 4:30 p.m.  Voicemails left after 4:30 p.m. will not be returned until the following business day.  For prescription refill requests, have your pharmacy contact our office.         Resources For Cancer Patients and their Caregivers ? American Cancer Society: Can assist with transportation, wigs, general needs, runs Look Good Feel Better.         579-524-7956 ? Cancer Care: Provides financial assistance, online support groups, medication/co-pay assistance.  1-800-813-HOPE 406-293-3543) ? [place] Assists [place] cancer patients and their families through emotional , educational and financial support.  671-845-3305 ? [place] DSS Where to apply for food stamps, Medicaid and utility assistance. 934-342-2538 ? [facility]: Transportation to medical appointments. 704-020-8207 ? Social Security Administration: May apply for disability if have a Stage IV cancer. 450-338-6034 276-371-5622 ? [employer], Disability and Transit Services: Assists with nutrition, care and transit needs. [place] Support Programs: @10RELATIVEDAYS @ > Cancer Support Group  2nd Tuesday of the month 1pm-2pm, Journey Room  > Creative Journey  3rd Tuesday of the month 1130am-1pm, Journey Room  > Look Good Feel Better  1st Wednesday of the month 10am-12 noon, Journey Room (Call [place] to register [phone])
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
## 2015-12-12 LAB — HEPATIC FUNCTION PANEL
ALT: 11 U/L (ref 6–29)
AST: 15 U/L (ref 10–35)
Albumin: 4.1 g/dL (ref 3.6–5.1)
Alkaline Phosphatase: 38 U/L (ref 33–130)
Bilirubin, Direct: 0.1 mg/dL
Indirect Bilirubin: 0.4 mg/dL (ref 0.2–1.2)
Total Bilirubin: 0.5 mg/dL (ref 0.2–1.2)
Total Protein: 7.4 g/dL (ref 6.1–8.1)

## 2015-12-12 LAB — LIPASE: Lipase: 45 U/L (ref 7–60)

## 2015-12-15 DIAGNOSIS — M791 Myalgia: Secondary | ICD-10-CM | POA: Diagnosis not present

## 2015-12-15 DIAGNOSIS — M542 Cervicalgia: Secondary | ICD-10-CM | POA: Diagnosis not present

## 2015-12-15 DIAGNOSIS — R202 Paresthesia of skin: Secondary | ICD-10-CM | POA: Diagnosis not present

## 2015-12-15 DIAGNOSIS — M545 Low back pain: Secondary | ICD-10-CM | POA: Diagnosis not present

## 2015-12-17 DIAGNOSIS — M542 Cervicalgia: Secondary | ICD-10-CM | POA: Diagnosis not present

## 2015-12-17 DIAGNOSIS — R202 Paresthesia of skin: Secondary | ICD-10-CM | POA: Diagnosis not present

## 2015-12-17 DIAGNOSIS — M791 Myalgia: Secondary | ICD-10-CM | POA: Diagnosis not present

## 2015-12-17 DIAGNOSIS — M545 Low back pain: Secondary | ICD-10-CM | POA: Diagnosis not present

## 2015-12-18 ENCOUNTER — Ambulatory Visit (HOSPITAL_COMMUNITY)
Admission: RE | Admit: 2015-12-18 | Discharge: 2015-12-18 | Disposition: A | Discharge status: Home / Self Care | Payer: BLUE CROSS/BLUE SHIELD | Source: Ambulatory Visit | Attending: Oncology | Admitting: Oncology

## 2015-12-18 ENCOUNTER — Encounter (HOSPITAL_BASED_OUTPATIENT_CLINIC_OR_DEPARTMENT_OTHER): Payer: BLUE CROSS/BLUE SHIELD | Admitting: Oncology

## 2015-12-18 ENCOUNTER — Encounter (HOSPITAL_COMMUNITY): Payer: Self-pay | Admitting: Oncology

## 2015-12-18 VITALS — BP 115/76 | HR 95 | Temp 98.4°F | Resp 16 | Ht 64.0 in | Wt 150.7 lb

## 2015-12-18 DIAGNOSIS — M65311 Trigger thumb, right thumb: Secondary | ICD-10-CM | POA: Diagnosis not present

## 2015-12-18 DIAGNOSIS — M79644 Pain in right finger(s): Secondary | ICD-10-CM | POA: Diagnosis not present

## 2015-12-18 MED ORDER — CELECOXIB 100 MG PO CAPS: 100.0000 mg | ORAL_CAPSULE | Freq: Two times a day (BID) | ORAL | 1 refills | Status: DC

## 2015-12-18 NOTE — Progress Notes (Signed)
Krista Doyle is seen as a work-in today for a nodule on her right thumb.  She reports it began about 3-4 days ago.  It is tender to palpation.  It is inhibiting her ability to use her cane.  She notes that her thumb gets stuck in positions and she has to force extension of her DIP at time.  This is painful.  She denies any radiation of her pain.  She denies any trauma or mechanism of injury.  She notes it suddenly started.  BP 115/76 (BP Location: Right Arm, Patient Position: Sitting)   Pulse 95   Temp 98.4 F (36.9 C) (Oral)   Resp 16   Ht 5\' 4"  (1.626 m)   Wt 150 lb 11.2 oz (68.4 kg)   SpO2 98%   BMI 25.87 kg/m  Gen: Pleasant, NAD, unaccompanied, rolling walker with her. HEENT: Normocephalic, atruamtic. Neck: Supple, trachea midline Skin: Warm and dry Neuro: A and O x 3, grossly normal. Right hand: tenderness to palpation at the PIP of her right thumb without any palpable abnormality.  Patient is very reluctant to allow pressure or deep palpation.  She winces in pain.  Passive ROM shows a DIP locking motion requiring patient to use force to release.  No ecchymosis noted.  Assessment: 1. Trigger finger of right thumb (DIP)  Plan: 1. Right thumb spica splint for immobilization during day, off at night.  She will get this OTC. 2. Celebrex 100 mg BID x 15 days.  This may be refilled 1 time. 3. Right thumb plain xray. 4. She will call in 4 weeks if not better.  If this is the case, I will refer to Ortho (Dr. Aline Brochure or Dr. Amedeo Plenty) for consideration of trigger point injection.    Patient and plan discussed with Dr. Ancil Linsey and she is in agreement with the aforementioned.   Robynn Pane, PA-C 12/18/2015 3:04 PM

## 2015-12-18 NOTE — Patient Instructions (Addendum)
[place] at [facility] Discharge Instructions  RECOMMENDATIONS MADE BY THE CONSULTANT AND ANY TEST RESULTS WILL BE SENT TO YOUR REFERRING PHYSICIAN.  You were seen today by Kirby Crigler PA-C. Rx for celebrex given. Get a thumb splint, wear splint only during the day. If no change call us back.    Thank you for choosing [place] at [facility] to provide your oncology and hematology care.  To afford each patient quality time with our provider, please arrive at least 15 minutes before your scheduled appointment time.   Beginning January 23rd 2017 lab work for the [facility] will be done in the  Main lab at [facility] on 1st floor. If you have a lab appointment with the [facility] please come in thru the  Main Entrance and check in at the main information desk  You need to re-schedule your appointment should you arrive 10 or more minutes late.  We strive to give you quality time with our providers, and arriving late affects you and other patients whose appointments are after yours.  Also, if you no show three or more times for appointments you may be dismissed from the clinic at the providers discretion.     Again, thank you for choosing [facility].  Our hope is that these requests will decrease the amount of time that you wait before being seen by our physicians.       _____________________________________________________________  Should you have questions after your visit to [facility], please contact our office at [phone] between the hours of 8:30 a.m. and 4:30 p.m.  Voicemails left after 4:30 p.m. will not be returned until the following business day.  For prescription refill requests, have your pharmacy contact our office.         Resources For Cancer Patients and their Caregivers ? American Cancer Society: Can assist with transportation, wigs, general needs, runs Look Good Feel Better.         (306) 798-2781 ? Cancer Care: Provides financial assistance, online support groups, medication/co-pay assistance.  1-800-813-HOPE (973) 389-4055) ? [facility] [place] Assists [place] cancer patients and their families through emotional , educational and financial support.  2501893218 ? [place] DSS Where to apply for food stamps, Medicaid and utility assistance. 539-700-5877 ? [facility]: Transportation to medical appointments. 8311346952 ? Social Security Administration: May apply for disability if have a Stage IV cancer. 3396623325 209-445-2870 ? [employer], Disability and Transit Services: Assists with nutrition, care and transit needs. [place] Support Programs: @10RELATIVEDAYS @ > Cancer Support Group  2nd Tuesday of the month 1pm-2pm, Journey Room  > Creative Journey  3rd Tuesday of the month 1130am-1pm, Journey Room  > Look Good Feel Better  1st Wednesday of the month 10am-12 noon, Journey Room (Call [place] to register [phone])

## 2015-12-20 DIAGNOSIS — M545 Low back pain: Secondary | ICD-10-CM | POA: Diagnosis not present

## 2015-12-20 DIAGNOSIS — R202 Paresthesia of skin: Secondary | ICD-10-CM | POA: Diagnosis not present

## 2015-12-20 DIAGNOSIS — M542 Cervicalgia: Secondary | ICD-10-CM | POA: Diagnosis not present

## 2015-12-20 DIAGNOSIS — M791 Myalgia: Secondary | ICD-10-CM | POA: Diagnosis not present

## 2015-12-23 ENCOUNTER — Telehealth: Payer: Self-pay | Admitting: Gastroenterology

## 2015-12-23 DIAGNOSIS — M542 Cervicalgia: Secondary | ICD-10-CM | POA: Diagnosis not present

## 2015-12-23 DIAGNOSIS — M791 Myalgia: Secondary | ICD-10-CM | POA: Diagnosis not present

## 2015-12-23 DIAGNOSIS — M545 Low back pain: Secondary | ICD-10-CM | POA: Diagnosis not present

## 2015-12-23 DIAGNOSIS — R202 Paresthesia of skin: Secondary | ICD-10-CM | POA: Diagnosis not present

## 2015-12-23 NOTE — Telephone Encounter (Signed)
PT is aware.

## 2015-12-23 NOTE — Telephone Encounter (Signed)
PLEASE CALL PT. HER LIVER AND PANCREAS TESTS ARE NORMAL. COMPLETE EUS WITH DR. Paulita Fujita IN [place].

## 2015-12-28 ENCOUNTER — Encounter: Payer: BLUE CROSS/BLUE SHIELD | Admitting: Registered Nurse

## 2015-12-29 DIAGNOSIS — M791 Myalgia: Secondary | ICD-10-CM | POA: Diagnosis not present

## 2015-12-29 DIAGNOSIS — M542 Cervicalgia: Secondary | ICD-10-CM | POA: Diagnosis not present

## 2015-12-29 DIAGNOSIS — M545 Low back pain: Secondary | ICD-10-CM | POA: Diagnosis not present

## 2015-12-29 DIAGNOSIS — R202 Paresthesia of skin: Secondary | ICD-10-CM | POA: Diagnosis not present

## 2015-12-30 ENCOUNTER — Encounter: Payer: Self-pay | Admitting: Registered Nurse

## 2015-12-30 ENCOUNTER — Encounter: Payer: BLUE CROSS/BLUE SHIELD | Attending: Physical Medicine & Rehabilitation | Admitting: Registered Nurse

## 2015-12-30 VITALS — BP 111/77 | HR 101

## 2015-12-30 DIAGNOSIS — G620 Drug-induced polyneuropathy: Secondary | ICD-10-CM | POA: Diagnosis not present

## 2015-12-30 DIAGNOSIS — M47816 Spondylosis without myelopathy or radiculopathy, lumbar region: Secondary | ICD-10-CM | POA: Insufficient documentation

## 2015-12-30 DIAGNOSIS — M5416 Radiculopathy, lumbar region: Secondary | ICD-10-CM

## 2015-12-30 DIAGNOSIS — G894 Chronic pain syndrome: Secondary | ICD-10-CM

## 2015-12-30 DIAGNOSIS — M47812 Spondylosis without myelopathy or radiculopathy, cervical region: Secondary | ICD-10-CM

## 2015-12-30 DIAGNOSIS — M542 Cervicalgia: Secondary | ICD-10-CM | POA: Diagnosis not present

## 2015-12-30 DIAGNOSIS — T451X5A Adverse effect of antineoplastic and immunosuppressive drugs, initial encounter: Secondary | ICD-10-CM

## 2015-12-30 DIAGNOSIS — Z9221 Personal history of antineoplastic chemotherapy: Secondary | ICD-10-CM | POA: Diagnosis not present

## 2015-12-30 DIAGNOSIS — G8929 Other chronic pain: Secondary | ICD-10-CM | POA: Insufficient documentation

## 2015-12-30 DIAGNOSIS — Z79899 Other long term (current) drug therapy: Secondary | ICD-10-CM

## 2015-12-30 DIAGNOSIS — Z5181 Encounter for therapeutic drug level monitoring: Secondary | ICD-10-CM

## 2015-12-30 DIAGNOSIS — I890 Lymphedema, not elsewhere classified: Secondary | ICD-10-CM | POA: Diagnosis not present

## 2015-12-30 DIAGNOSIS — M545 Low back pain: Secondary | ICD-10-CM | POA: Diagnosis not present

## 2015-12-30 DIAGNOSIS — Z853 Personal history of malignant neoplasm of breast: Secondary | ICD-10-CM | POA: Insufficient documentation

## 2015-12-30 MED ORDER — OXYCODONE-ACETAMINOPHEN 10-325 MG PO TABS: 1.0000 | ORAL_TABLET | Freq: Three times a day (TID) | ORAL | 0 refills | Status: DC | PRN

## 2015-12-30 MED ORDER — FENTANYL 12 MCG/HR TD PT72: 12.5000 ug | MEDICATED_PATCH | TRANSDERMAL | 0 refills | Status: DC

## 2015-12-30 NOTE — Progress Notes (Signed)
Subjective:    Patient ID: Krista Doyle, female    DOB: 1960-04-04, 55 y.o.   MRN: [id number]  HPI: [name] is a 55 year old female who returns for follow up appointment for chronic pain and medication refill. She states her pain is located in her neck radiating into her right shoulder, lower back radiating into her right lower extremity anteriorly. She rates her pain 7. Her current exercise regime is attending physical therapy two days a week, performing stretching exercises and walking.   Pain Inventory Average Pain 8 Pain Right Now 7 My pain is constant, sharp and stabbing  In the last 24 hours, has pain interfered with the following? General activity 9 Relation with others 10 Enjoyment of life 10 What TIME of day is your pain at its worst? evening, night Sleep (in general) Fair  Pain is worse with: walking, bending, sitting and standing Pain improves with: rest, heat/ice, therapy/exercise, medication and TENS Relief from Meds: 6  Mobility walk with assistance use a cane use a walker ability to climb steps?  yes do you drive?  yes  Function disabled: date disabled .  Neuro/Psych weakness numbness tingling spasms  Prior Studies Any changes since last visit?  no  Physicians involved in your care Any changes since last visit?  no   Family History  Problem Relation Age of Onset  . Colon cancer Neg Hx    Social History   Social History  . Marital status: Married    Spouse name: N/A  . Number of children: N/A  . Years of education: N/A   Occupational History  . Disabled    Social History Main Topics  . Smoking status: Never Smoker  . Smokeless tobacco: Never Used  . Alcohol use No  . Drug use: No  . Sexual activity: Yes   Other Topics Concern  . Not on file   Social History Narrative  . No narrative on file   Past Surgical History:  Procedure Laterality Date  . BACK SURGERY    . BREAST LUMPECTOMY    .  COLONOSCOPY     about 3 yrs ago in [place]  . KNEE SURGERY     right  . TONSILLECTOMY     Past Medical History:  Diagnosis Date  . Asthma   . Back pain   . Breast cancer ([place]) 2008   left  . Diabetes ([place])   . Hypercholesterolemia   . Neuropathy ([place])    extremities after chemo   There were no vitals taken for this visit.  Opioid Risk Score:   Fall Risk Score:  `1  Depression screen PHQ 2/9  Depression screen [facility] 2/9 10/07/2015 08/31/2015 07/06/2015 04/28/2015 02/11/2015  Decreased Interest 0 1 0 1 1  Down, Depressed, Hopeless 0 0 0 0 0  PHQ - 2 Score 0 1 0 1 1  Altered sleeping - - - - 3  Tired, decreased energy - - - - 0  Change in appetite - - - - 0  Feeling bad or failure about yourself  - - - - 0  Trouble concentrating - - - - 0  Moving slowly or fidgety/restless - - - - 0  Suicidal thoughts - - - - 0  PHQ-9 Score - - - - 4  Difficult doing work/chores - - - - Somewhat difficult   Review of Systems  HENT: Negative.   Eyes: Negative.   Respiratory: Negative.   Cardiovascular: Negative.  Gastrointestinal: Negative.   Endocrine: Negative.   Genitourinary: Negative.   Musculoskeletal: Positive for arthralgias, back pain and gait problem.  Skin: Negative.   Allergic/Immunologic: Negative.   Neurological: Positive for weakness and numbness.       Tingling  Hematological: Negative.   Psychiatric/Behavioral: Negative.   All other systems reviewed and are negative.      Objective:   Physical Exam  Constitutional: She is oriented to person, place, and time. She appears well-developed and well-nourished.  HENT:  Head: Normocephalic and atraumatic.  Neck: Normal range of motion. Neck supple.  Cervical Paraspinal Tenderness: C-5-C-6  Cardiovascular: Normal rate and regular rhythm.   Pulmonary/Chest: Effort normal and breath sounds normal.  Musculoskeletal:  Normal Muscle Bulk and Muscle Testing Reveals: Upper Extremities: Right: Decreased ROM 45 Degrees and  Muscle Strength 3/5 wearing right hand slint Left: Decreased ROM 90 Degrees and Muscle Strength 4/5 Right AC Joint Tenderness Lumbar Paraspinal Tenderness: L-3-L-5 Lower Extremities: Full ROM and Muscle Strength 5/5 Right Lower Extremity Flexion Produces Pain into Extremity Arises from Table slowly using straight cane for support Narrow Based Gait  Neurological: She is alert and oriented to person, place, and time.  Skin: Skin is warm and dry.  Psychiatric: She has a normal mood and affect.  Nursing note and vitals reviewed.         Assessment & Plan:  1. Chronic low back pain, lumbar spondylosis, spondylolisthesis, with lumbar radiculopathy. Refilled: Fentanyl 12 mcg one patch every three days #10 and Oxycodone 10/325 mg one tablet every 8 hours as needed for pain #90.  We will continue the opioid monitoring program, this consists of regular clinic visits, examinations, urine drug screen, pill counts as well as use of [place] Controlled Substance reporting System. 2. Chemotherapy induced peripheral neuropathy: Continue Lyrica at 150mg  TID   20 minutes of face to face patient care time was spent during this visit. All questions were encouraged and answered.   F/U in 1 month

## 2015-12-31 DIAGNOSIS — M542 Cervicalgia: Secondary | ICD-10-CM | POA: Diagnosis not present

## 2015-12-31 DIAGNOSIS — M791 Myalgia: Secondary | ICD-10-CM | POA: Diagnosis not present

## 2015-12-31 DIAGNOSIS — M545 Low back pain: Secondary | ICD-10-CM | POA: Diagnosis not present

## 2015-12-31 DIAGNOSIS — R202 Paresthesia of skin: Secondary | ICD-10-CM | POA: Diagnosis not present

## 2016-01-01 DIAGNOSIS — K838 Other specified diseases of biliary tract: Secondary | ICD-10-CM | POA: Diagnosis not present

## 2016-01-05 DIAGNOSIS — M542 Cervicalgia: Secondary | ICD-10-CM | POA: Diagnosis not present

## 2016-01-05 DIAGNOSIS — M545 Low back pain: Secondary | ICD-10-CM | POA: Diagnosis not present

## 2016-01-05 DIAGNOSIS — R202 Paresthesia of skin: Secondary | ICD-10-CM | POA: Diagnosis not present

## 2016-01-05 DIAGNOSIS — M791 Myalgia: Secondary | ICD-10-CM | POA: Diagnosis not present

## 2016-01-07 DIAGNOSIS — M542 Cervicalgia: Secondary | ICD-10-CM | POA: Diagnosis not present

## 2016-01-07 DIAGNOSIS — R202 Paresthesia of skin: Secondary | ICD-10-CM | POA: Diagnosis not present

## 2016-01-07 DIAGNOSIS — M545 Low back pain: Secondary | ICD-10-CM | POA: Diagnosis not present

## 2016-01-07 DIAGNOSIS — M791 Myalgia: Secondary | ICD-10-CM | POA: Diagnosis not present

## 2016-01-08 DIAGNOSIS — M791 Myalgia: Secondary | ICD-10-CM | POA: Diagnosis not present

## 2016-01-08 DIAGNOSIS — R202 Paresthesia of skin: Secondary | ICD-10-CM | POA: Diagnosis not present

## 2016-01-08 DIAGNOSIS — M545 Low back pain: Secondary | ICD-10-CM | POA: Diagnosis not present

## 2016-01-08 DIAGNOSIS — M542 Cervicalgia: Secondary | ICD-10-CM | POA: Diagnosis not present

## 2016-01-12 DIAGNOSIS — R932 Abnormal findings on diagnostic imaging of liver and biliary tract: Secondary | ICD-10-CM | POA: Diagnosis not present

## 2016-01-12 DIAGNOSIS — K869 Disease of pancreas, unspecified: Secondary | ICD-10-CM | POA: Diagnosis not present

## 2016-01-12 DIAGNOSIS — K838 Other specified diseases of biliary tract: Secondary | ICD-10-CM | POA: Diagnosis not present

## 2016-01-13 DIAGNOSIS — R202 Paresthesia of skin: Secondary | ICD-10-CM | POA: Diagnosis not present

## 2016-01-13 DIAGNOSIS — M791 Myalgia: Secondary | ICD-10-CM | POA: Diagnosis not present

## 2016-01-13 DIAGNOSIS — M542 Cervicalgia: Secondary | ICD-10-CM | POA: Diagnosis not present

## 2016-01-13 DIAGNOSIS — M545 Low back pain: Secondary | ICD-10-CM | POA: Diagnosis not present

## 2016-01-14 DIAGNOSIS — R202 Paresthesia of skin: Secondary | ICD-10-CM | POA: Diagnosis not present

## 2016-01-14 DIAGNOSIS — M791 Myalgia: Secondary | ICD-10-CM | POA: Diagnosis not present

## 2016-01-14 DIAGNOSIS — M542 Cervicalgia: Secondary | ICD-10-CM | POA: Diagnosis not present

## 2016-01-14 DIAGNOSIS — M545 Low back pain: Secondary | ICD-10-CM | POA: Diagnosis not present

## 2016-01-15 DIAGNOSIS — M542 Cervicalgia: Secondary | ICD-10-CM | POA: Diagnosis not present

## 2016-01-15 DIAGNOSIS — M545 Low back pain: Secondary | ICD-10-CM | POA: Diagnosis not present

## 2016-01-15 DIAGNOSIS — M791 Myalgia: Secondary | ICD-10-CM | POA: Diagnosis not present

## 2016-01-15 DIAGNOSIS — R202 Paresthesia of skin: Secondary | ICD-10-CM | POA: Diagnosis not present

## 2016-01-18 DIAGNOSIS — M542 Cervicalgia: Secondary | ICD-10-CM | POA: Diagnosis not present

## 2016-01-18 DIAGNOSIS — M791 Myalgia: Secondary | ICD-10-CM | POA: Diagnosis not present

## 2016-01-18 DIAGNOSIS — R202 Paresthesia of skin: Secondary | ICD-10-CM | POA: Diagnosis not present

## 2016-01-18 DIAGNOSIS — M545 Low back pain: Secondary | ICD-10-CM | POA: Diagnosis not present

## 2016-01-19 DIAGNOSIS — M545 Low back pain: Secondary | ICD-10-CM | POA: Diagnosis not present

## 2016-01-19 DIAGNOSIS — M791 Myalgia: Secondary | ICD-10-CM | POA: Diagnosis not present

## 2016-01-19 DIAGNOSIS — M542 Cervicalgia: Secondary | ICD-10-CM | POA: Diagnosis not present

## 2016-01-19 DIAGNOSIS — R202 Paresthesia of skin: Secondary | ICD-10-CM | POA: Diagnosis not present

## 2016-01-21 DIAGNOSIS — M791 Myalgia: Secondary | ICD-10-CM | POA: Diagnosis not present

## 2016-01-21 DIAGNOSIS — R202 Paresthesia of skin: Secondary | ICD-10-CM | POA: Diagnosis not present

## 2016-01-21 DIAGNOSIS — M542 Cervicalgia: Secondary | ICD-10-CM | POA: Diagnosis not present

## 2016-01-21 DIAGNOSIS — M545 Low back pain: Secondary | ICD-10-CM | POA: Diagnosis not present

## 2016-01-22 ENCOUNTER — Encounter (HOSPITAL_COMMUNITY): Payer: Self-pay | Admitting: Hematology & Oncology

## 2016-01-26 DIAGNOSIS — R202 Paresthesia of skin: Secondary | ICD-10-CM | POA: Diagnosis not present

## 2016-01-26 DIAGNOSIS — M542 Cervicalgia: Secondary | ICD-10-CM | POA: Diagnosis not present

## 2016-01-26 DIAGNOSIS — M545 Low back pain: Secondary | ICD-10-CM | POA: Diagnosis not present

## 2016-01-26 DIAGNOSIS — M791 Myalgia: Secondary | ICD-10-CM | POA: Diagnosis not present

## 2016-01-28 ENCOUNTER — Ambulatory Visit: Payer: BLUE CROSS/BLUE SHIELD | Admitting: Registered Nurse

## 2016-01-28 ENCOUNTER — Encounter: Payer: Self-pay | Admitting: *Deleted

## 2016-01-28 ENCOUNTER — Encounter: Payer: BLUE CROSS/BLUE SHIELD | Attending: Physical Medicine & Rehabilitation | Admitting: *Deleted

## 2016-01-28 VITALS — BP 126/86 | HR 100 | Resp 16

## 2016-01-28 DIAGNOSIS — Z853 Personal history of malignant neoplasm of breast: Secondary | ICD-10-CM | POA: Insufficient documentation

## 2016-01-28 DIAGNOSIS — M791 Myalgia: Secondary | ICD-10-CM | POA: Diagnosis not present

## 2016-01-28 DIAGNOSIS — G620 Drug-induced polyneuropathy: Secondary | ICD-10-CM | POA: Diagnosis not present

## 2016-01-28 DIAGNOSIS — M5416 Radiculopathy, lumbar region: Secondary | ICD-10-CM

## 2016-01-28 DIAGNOSIS — Z9221 Personal history of antineoplastic chemotherapy: Secondary | ICD-10-CM | POA: Insufficient documentation

## 2016-01-28 DIAGNOSIS — Z5181 Encounter for therapeutic drug level monitoring: Secondary | ICD-10-CM

## 2016-01-28 DIAGNOSIS — M545 Low back pain: Secondary | ICD-10-CM | POA: Insufficient documentation

## 2016-01-28 DIAGNOSIS — Z79899 Other long term (current) drug therapy: Secondary | ICD-10-CM

## 2016-01-28 DIAGNOSIS — M47816 Spondylosis without myelopathy or radiculopathy, lumbar region: Secondary | ICD-10-CM | POA: Insufficient documentation

## 2016-01-28 DIAGNOSIS — M47812 Spondylosis without myelopathy or radiculopathy, cervical region: Secondary | ICD-10-CM

## 2016-01-28 DIAGNOSIS — I890 Lymphedema, not elsewhere classified: Secondary | ICD-10-CM | POA: Diagnosis not present

## 2016-01-28 DIAGNOSIS — G8929 Other chronic pain: Secondary | ICD-10-CM | POA: Insufficient documentation

## 2016-01-28 DIAGNOSIS — G894 Chronic pain syndrome: Secondary | ICD-10-CM

## 2016-01-28 DIAGNOSIS — M542 Cervicalgia: Secondary | ICD-10-CM | POA: Diagnosis not present

## 2016-01-28 DIAGNOSIS — T451X5A Adverse effect of antineoplastic and immunosuppressive drugs, initial encounter: Secondary | ICD-10-CM

## 2016-01-28 DIAGNOSIS — R202 Paresthesia of skin: Secondary | ICD-10-CM | POA: Diagnosis not present

## 2016-01-28 MED ORDER — FENTANYL 12 MCG/HR TD PT72: 12.5000 ug | MEDICATED_PATCH | TRANSDERMAL | 0 refills | Status: DC

## 2016-01-28 MED ORDER — OXYCODONE-ACETAMINOPHEN 10-325 MG PO TABS: 1.0000 | ORAL_TABLET | Freq: Three times a day (TID) | ORAL | 0 refills | Status: DC | PRN

## 2016-01-28 NOTE — Progress Notes (Signed)
Krista Doyle is here for RN medication refill visit for her narcotics Fentanyl 12.27mcg patches and oxycodone 10/325. Her pain is rated at a 9.  Pill and patch counts are appropriate and NCCSR reviewed and appropriate as well.  When reconciling  medication list her vit d and oscal were deleted in error and replaced on her medication list as ordered by Ancil Linsey MD. She denies any falls though she has had a few near misses recently. She has not traveled outside of the [place] in the past 21 days. She has voiced no other complaints during this visit.  Refill was given of her fentanyl patch 12.5 mcg q 72 hours #10 and Oxycodone 10/325 one q 8 hours #90.  She will return in one month for visit with NP.

## 2016-01-29 DIAGNOSIS — M545 Low back pain: Secondary | ICD-10-CM | POA: Diagnosis not present

## 2016-01-29 DIAGNOSIS — M542 Cervicalgia: Secondary | ICD-10-CM | POA: Diagnosis not present

## 2016-02-09 ENCOUNTER — Telehealth (HOSPITAL_COMMUNITY): Payer: Self-pay | Admitting: Hematology & Oncology

## 2016-02-09 DIAGNOSIS — R202 Paresthesia of skin: Secondary | ICD-10-CM | POA: Diagnosis not present

## 2016-02-09 DIAGNOSIS — M545 Low back pain: Secondary | ICD-10-CM | POA: Diagnosis not present

## 2016-02-09 DIAGNOSIS — M791 Myalgia: Secondary | ICD-10-CM | POA: Diagnosis not present

## 2016-02-09 DIAGNOSIS — M542 Cervicalgia: Secondary | ICD-10-CM | POA: Diagnosis not present

## 2016-02-09 NOTE — Telephone Encounter (Signed)
EMAILED PROLIA DENIAL TO [place] @ [facility] for review. Per my pc on 03/09/15 [name] was not required  Telephone Encounter Encounter Date: 03/09/2015 1:53 PM Levada Dy M Denny    PER CLAIRE E AUTH IS NOT REQUIRED FOR [id number] PROLIA AND THAT [employer] IS THE 2NDRY INS. WILL CONFIRM WITH PT CALL REF# [name] [name]    Electronically signed by Epifanio Lesches at 03/09/2015 1:55 PM      Telephone on 03/09/2015        Detailed Report

## 2016-02-10 DIAGNOSIS — R202 Paresthesia of skin: Secondary | ICD-10-CM | POA: Diagnosis not present

## 2016-02-10 DIAGNOSIS — M542 Cervicalgia: Secondary | ICD-10-CM | POA: Diagnosis not present

## 2016-02-10 DIAGNOSIS — M791 Myalgia: Secondary | ICD-10-CM | POA: Diagnosis not present

## 2016-02-10 DIAGNOSIS — M545 Low back pain: Secondary | ICD-10-CM | POA: Diagnosis not present

## 2016-02-16 DIAGNOSIS — M542 Cervicalgia: Secondary | ICD-10-CM | POA: Diagnosis not present

## 2016-02-16 DIAGNOSIS — M791 Myalgia: Secondary | ICD-10-CM | POA: Diagnosis not present

## 2016-02-16 DIAGNOSIS — R202 Paresthesia of skin: Secondary | ICD-10-CM | POA: Diagnosis not present

## 2016-02-16 DIAGNOSIS — M545 Low back pain: Secondary | ICD-10-CM | POA: Diagnosis not present

## 2016-02-19 DIAGNOSIS — E119 Type 2 diabetes mellitus without complications: Secondary | ICD-10-CM | POA: Diagnosis not present

## 2016-02-19 DIAGNOSIS — M791 Myalgia: Secondary | ICD-10-CM | POA: Diagnosis not present

## 2016-02-19 DIAGNOSIS — I10 Essential (primary) hypertension: Secondary | ICD-10-CM | POA: Diagnosis not present

## 2016-02-19 DIAGNOSIS — J4520 Mild intermittent asthma, uncomplicated: Secondary | ICD-10-CM | POA: Diagnosis not present

## 2016-02-19 DIAGNOSIS — M545 Low back pain: Secondary | ICD-10-CM | POA: Diagnosis not present

## 2016-02-19 DIAGNOSIS — M542 Cervicalgia: Secondary | ICD-10-CM | POA: Diagnosis not present

## 2016-02-19 DIAGNOSIS — R202 Paresthesia of skin: Secondary | ICD-10-CM | POA: Diagnosis not present

## 2016-02-19 DIAGNOSIS — M79641 Pain in right hand: Secondary | ICD-10-CM | POA: Diagnosis not present

## 2016-02-20 DIAGNOSIS — R202 Paresthesia of skin: Secondary | ICD-10-CM | POA: Diagnosis not present

## 2016-02-20 DIAGNOSIS — M545 Low back pain: Secondary | ICD-10-CM | POA: Diagnosis not present

## 2016-02-20 DIAGNOSIS — M791 Myalgia: Secondary | ICD-10-CM | POA: Diagnosis not present

## 2016-02-20 DIAGNOSIS — M542 Cervicalgia: Secondary | ICD-10-CM | POA: Diagnosis not present

## 2016-02-22 DIAGNOSIS — M791 Myalgia: Secondary | ICD-10-CM | POA: Diagnosis not present

## 2016-02-22 DIAGNOSIS — M542 Cervicalgia: Secondary | ICD-10-CM | POA: Diagnosis not present

## 2016-02-22 DIAGNOSIS — M545 Low back pain: Secondary | ICD-10-CM | POA: Diagnosis not present

## 2016-02-22 DIAGNOSIS — R202 Paresthesia of skin: Secondary | ICD-10-CM | POA: Diagnosis not present

## 2016-02-23 ENCOUNTER — Encounter: Payer: BLUE CROSS/BLUE SHIELD | Admitting: Registered Nurse

## 2016-02-23 DIAGNOSIS — M545 Low back pain: Secondary | ICD-10-CM | POA: Diagnosis not present

## 2016-02-23 DIAGNOSIS — M542 Cervicalgia: Secondary | ICD-10-CM | POA: Diagnosis not present

## 2016-02-23 DIAGNOSIS — M791 Myalgia: Secondary | ICD-10-CM | POA: Diagnosis not present

## 2016-02-23 DIAGNOSIS — R202 Paresthesia of skin: Secondary | ICD-10-CM | POA: Diagnosis not present

## 2016-02-25 DIAGNOSIS — M545 Low back pain: Secondary | ICD-10-CM | POA: Diagnosis not present

## 2016-02-25 DIAGNOSIS — M791 Myalgia: Secondary | ICD-10-CM | POA: Diagnosis not present

## 2016-02-25 DIAGNOSIS — M542 Cervicalgia: Secondary | ICD-10-CM | POA: Diagnosis not present

## 2016-02-25 DIAGNOSIS — R202 Paresthesia of skin: Secondary | ICD-10-CM | POA: Diagnosis not present

## 2016-02-29 ENCOUNTER — Telehealth: Payer: Self-pay

## 2016-02-29 ENCOUNTER — Ambulatory Visit (HOSPITAL_COMMUNITY): Payer: BLUE CROSS/BLUE SHIELD

## 2016-02-29 DIAGNOSIS — R202 Paresthesia of skin: Secondary | ICD-10-CM | POA: Diagnosis not present

## 2016-02-29 DIAGNOSIS — M545 Low back pain: Secondary | ICD-10-CM | POA: Diagnosis not present

## 2016-02-29 DIAGNOSIS — M791 Myalgia: Secondary | ICD-10-CM | POA: Diagnosis not present

## 2016-02-29 DIAGNOSIS — M542 Cervicalgia: Secondary | ICD-10-CM | POA: Diagnosis not present

## 2016-02-29 NOTE — Telephone Encounter (Signed)
Error

## 2016-03-01 ENCOUNTER — Telehealth: Payer: Self-pay | Admitting: Registered Nurse

## 2016-03-01 ENCOUNTER — Encounter: Payer: BLUE CROSS/BLUE SHIELD | Attending: Physical Medicine & Rehabilitation | Admitting: Registered Nurse

## 2016-03-01 ENCOUNTER — Encounter: Payer: Self-pay | Admitting: Registered Nurse

## 2016-03-01 VITALS — BP 127/86 | HR 92

## 2016-03-01 DIAGNOSIS — G620 Drug-induced polyneuropathy: Secondary | ICD-10-CM | POA: Diagnosis not present

## 2016-03-01 DIAGNOSIS — M545 Low back pain: Secondary | ICD-10-CM | POA: Diagnosis not present

## 2016-03-01 DIAGNOSIS — R202 Paresthesia of skin: Secondary | ICD-10-CM | POA: Diagnosis not present

## 2016-03-01 DIAGNOSIS — M5416 Radiculopathy, lumbar region: Secondary | ICD-10-CM | POA: Diagnosis not present

## 2016-03-01 DIAGNOSIS — M47816 Spondylosis without myelopathy or radiculopathy, lumbar region: Secondary | ICD-10-CM | POA: Insufficient documentation

## 2016-03-01 DIAGNOSIS — M791 Myalgia: Secondary | ICD-10-CM | POA: Diagnosis not present

## 2016-03-01 DIAGNOSIS — Z79899 Other long term (current) drug therapy: Secondary | ICD-10-CM | POA: Diagnosis not present

## 2016-03-01 DIAGNOSIS — Z853 Personal history of malignant neoplasm of breast: Secondary | ICD-10-CM | POA: Insufficient documentation

## 2016-03-01 DIAGNOSIS — T451X5A Adverse effect of antineoplastic and immunosuppressive drugs, initial encounter: Secondary | ICD-10-CM | POA: Diagnosis not present

## 2016-03-01 DIAGNOSIS — Z9221 Personal history of antineoplastic chemotherapy: Secondary | ICD-10-CM | POA: Insufficient documentation

## 2016-03-01 DIAGNOSIS — Z5181 Encounter for therapeutic drug level monitoring: Secondary | ICD-10-CM | POA: Diagnosis not present

## 2016-03-01 DIAGNOSIS — I890 Lymphedema, not elsewhere classified: Secondary | ICD-10-CM | POA: Insufficient documentation

## 2016-03-01 DIAGNOSIS — G894 Chronic pain syndrome: Secondary | ICD-10-CM | POA: Diagnosis not present

## 2016-03-01 DIAGNOSIS — M542 Cervicalgia: Secondary | ICD-10-CM | POA: Diagnosis not present

## 2016-03-01 DIAGNOSIS — G8929 Other chronic pain: Secondary | ICD-10-CM | POA: Diagnosis not present

## 2016-03-01 MED ORDER — FENTANYL 12 MCG/HR TD PT72: 12.5000 ug | MEDICATED_PATCH | TRANSDERMAL | 0 refills | Status: DC

## 2016-03-01 MED ORDER — OXYCODONE-ACETAMINOPHEN 10-325 MG PO TABS: 1.0000 | ORAL_TABLET | Freq: Three times a day (TID) | ORAL | 0 refills | Status: DC | PRN

## 2016-03-01 NOTE — Telephone Encounter (Signed)
On January 30,2018 the [name] was reviewed no conflict was seen on the [place] with multiple prescribers. Krista Doyle has a signed narcotic contract with our office. If there were any discrepancies this would have been reported to her physician.

## 2016-03-01 NOTE — Progress Notes (Signed)
Subjective:    Patient ID: Krista Doyle, female    DOB: May 20, 1960, 56 y.o.   MRN: [id number]  HPI:  [name] is a 56year old female who returns for follow up appointment for chronic pain and medication refill. She states her pain is located in her neck radiating into her right shoulder, right hand,lower back radiating into her right lower extremity anteriorly. She rates her pain 6. Her current exercise regime is attending physical therapy two days a week, performing stretching exercisesand walking.  Pain Inventory Average Pain 8 Pain Right Now 6 My pain is .  In the last 24 hours, has pain interfered with the following? General activity 8 Relation with others 8 Enjoyment of life 10 What TIME of day is your pain at its worst? evening Sleep (in general) Fair  Pain is worse with: walking, bending, sitting and standing Pain improves with: . Relief from Meds: .  Mobility use a cane ability to climb steps?  yes do you drive?  yes  Function disabled: date disabled .  Neuro/Psych weakness numbness tingling spasms  Prior Studies Any changes since last visit?  no  Physicians involved in your care Any changes since last visit?  no   Family History  Problem Relation Age of Onset  . Colon cancer Neg Hx    Social History   Social History  . Marital status: Married    Spouse name: N/A  . Number of children: N/A  . Years of education: N/A   Occupational History  . Disabled    Social History Main Topics  . Smoking status: Never Smoker  . Smokeless tobacco: Never Used  . Alcohol use No  . Drug use: No  . Sexual activity: Yes   Other Topics Concern  . Not on file   Social History Narrative  . No narrative on file   Past Surgical History:  Procedure Laterality Date  . BACK SURGERY    . BREAST LUMPECTOMY    . COLONOSCOPY     about 3 yrs ago in [place]  . KNEE SURGERY     right  . TONSILLECTOMY     Past Medical History:    Diagnosis Date  . Asthma   . Back pain   . Breast cancer ([place]) 2008   left  . Diabetes ([place])   . Hypercholesterolemia   . Neuropathy ([place])    extremities after chemo   There were no vitals taken for this visit.  Opioid Risk Score:   Fall Risk Score:  `1  Depression screen PHQ 2/9  Depression screen [facility] 2/9 10/07/2015 08/31/2015 07/06/2015 04/28/2015 02/11/2015  Decreased Interest 0 1 0 1 1  Down, Depressed, Hopeless 0 0 0 0 0  PHQ - 2 Score 0 1 0 1 1  Altered sleeping - - - - 3  Tired, decreased energy - - - - 0  Change in appetite - - - - 0  Feeling bad or failure about yourself  - - - - 0  Trouble concentrating - - - - 0  Moving slowly or fidgety/restless - - - - 0  Suicidal thoughts - - - - 0  PHQ-9 Score - - - - 4  Difficult doing work/chores - - - - Somewhat difficult   Review of Systems  HENT: Negative.   Eyes: Negative.   Respiratory: Negative.   Cardiovascular: Negative.   Gastrointestinal: Negative.   Endocrine: Negative.   Genitourinary: Negative.   Musculoskeletal: Positive  for back pain and gait problem.  Allergic/Immunologic: Negative.   Neurological: Positive for weakness and numbness.  Psychiatric/Behavioral: Negative.        Objective:   Physical Exam  Constitutional: She is oriented to person, place, and time. She appears well-developed and well-nourished.  HENT:  Head: Normocephalic and atraumatic.  Neck: Normal range of motion. Neck supple.  Cervical Paraspinal Tenderness: C-5-C-6  Cardiovascular: Normal rate and regular rhythm.   Pulmonary/Chest: Effort normal and breath sounds normal.  Musculoskeletal:  Normal Muscle Bulk and Muscle Testing Reveals: Upper Extremities: Full ROM and Muscle Strength 5/5 Thoracic Paraspinal Tenderness: T-10-T-3 Lumbar Paraspinal Tenderness: L-3-L-5 Lower Extremities: Full ROM and Muscle Strength 5/5 Arises from chair with ease using straight cane for support Narrow Based gait   Neurological: She is alert and  oriented to person, place, and time.  Skin: Skin is warm and dry.  Psychiatric: She has a normal mood and affect.  Nursing note and vitals reviewed.         Assessment & Plan:  1. Chronic low back pain, lumbar spondylosis, spondylolisthesis, with lumbar radiculopathy. Refilled: Fentanyl 12 mcg one patch every three days #10 and Oxycodone 10/325 mg one tablet every 8 hours as needed for pain #90.  We will continue the opioid monitoring program, this consists of regular clinic visits, examinations, urine drug screen, pill counts as well as use of [place] Controlled Substance reporting System. 2. Chemotherapy induced peripheral neuropathy: Continue Lyrica at 150mg  TID   20 minutes of face to face patient care time was spent during this visit. All questions were encouraged and answered.   F/U in 1 month

## 2016-03-02 ENCOUNTER — Ambulatory Visit (HOSPITAL_COMMUNITY)
Admission: RE | Admit: 2016-03-02 | Discharge: 2016-03-02 | Disposition: A | Discharge status: Home / Self Care | Payer: BLUE CROSS/BLUE SHIELD | Source: Ambulatory Visit | Attending: Hematology & Oncology | Admitting: Hematology & Oncology

## 2016-03-02 DIAGNOSIS — Z1231 Encounter for screening mammogram for malignant neoplasm of breast: Secondary | ICD-10-CM | POA: Insufficient documentation

## 2016-03-02 DIAGNOSIS — Z1239 Encounter for other screening for malignant neoplasm of breast: Secondary | ICD-10-CM

## 2016-03-02 DIAGNOSIS — C50412 Malignant neoplasm of upper-outer quadrant of left female breast: Secondary | ICD-10-CM

## 2016-03-02 DIAGNOSIS — Z170 Estrogen receptor positive status [ER+]: Secondary | ICD-10-CM

## 2016-03-03 ENCOUNTER — Other Ambulatory Visit (HOSPITAL_COMMUNITY): Payer: Self-pay | Admitting: Hematology & Oncology

## 2016-03-03 DIAGNOSIS — M545 Low back pain: Secondary | ICD-10-CM | POA: Diagnosis not present

## 2016-03-03 DIAGNOSIS — M791 Myalgia: Secondary | ICD-10-CM | POA: Diagnosis not present

## 2016-03-03 DIAGNOSIS — R928 Other abnormal and inconclusive findings on diagnostic imaging of breast: Secondary | ICD-10-CM

## 2016-03-03 DIAGNOSIS — M542 Cervicalgia: Secondary | ICD-10-CM | POA: Diagnosis not present

## 2016-03-03 DIAGNOSIS — R202 Paresthesia of skin: Secondary | ICD-10-CM | POA: Diagnosis not present

## 2016-03-07 ENCOUNTER — Ambulatory Visit (INDEPENDENT_AMBULATORY_CARE_PROVIDER_SITE_OTHER): Payer: BLUE CROSS/BLUE SHIELD | Admitting: Orthopedic Surgery

## 2016-03-07 ENCOUNTER — Encounter: Payer: Self-pay | Admitting: Orthopedic Surgery

## 2016-03-07 VITALS — BP 139/91 | HR 97 | Wt 148.0 lb

## 2016-03-07 DIAGNOSIS — M65311 Trigger thumb, right thumb: Secondary | ICD-10-CM

## 2016-03-07 DIAGNOSIS — M654 Radial styloid tenosynovitis [de Quervain]: Secondary | ICD-10-CM | POA: Diagnosis not present

## 2016-03-07 NOTE — Patient Instructions (Addendum)
You have received an injection of steroids into the joint. 15% of patients will have increased pain within the 24 hours postinjection.   This is transient and will go away.   We recommend that you use ice packs on the injection site for 20 minutes every 2 hours and extra strength Tylenol 2 tablets every 8 as needed until the pain resolves.  If you continue to have pain after taking the Tylenol and using the ice please call the office for further instructions.   De Quervain Tenosynovitis Introduction Tendons attach muscles to bones. They also help with joint movements. When tendons become irritated or swollen, it is called tendinitis. The extensor pollicis brevis (EPB) tendon connects the EPB muscle to a bone that is near the base of the thumb. The EPB muscle helps to straighten and extend the thumb. De Quervain tenosynovitis is a condition in which the EPB tendon lining (sheath) becomes irritated, thickened, and swollen. This condition is sometimes called stenosing tenosynovitis. This condition causes pain on the thumb side of the back of the wrist. What are the causes? Causes of this condition include:  Activities that repeatedly cause your thumb and wrist to extend.  A sudden increase in activity or change in activity that affects your wrist. What increases the risk? This condition is more likely to develop in:  Females.  People who have diabetes.  Women who have recently given birth.  People who are over 34 years of age.  People who do activities that involve repeated hand and wrist motions, such as tennis, racquetball, volleyball, gardening, and taking care of children.  People who do heavy labor.  People who have poor wrist strength and flexibility.  People who do not warm up properly before activities. What are the signs or symptoms? Symptoms of this condition include:  Pain or tenderness over the thumb side of the back of the wrist when your thumb and wrist are not  moving.  Pain that gets worse when you straighten your thumb or extend your thumb or wrist.  Pain when the injured area is touched.  Locking or catching of the thumb joint while you bend and straighten your thumb.  Decreased thumb motion due to pain.  Swelling over the affected area. How is this diagnosed? This condition is diagnosed with a medical history and physical exam. Your health care provider will ask for details about your injury and ask about your symptoms. How is this treated? Treatment may include the use of icing and medicines to reduce pain and swelling. You may also be advised to wear a splint or brace to limit your thumb and wrist motion. In less severe cases, treatment may also include working with a physical therapist to strengthen your wrist and calm the irritation around your EPB tendon sheath. In severe cases, surgery may be needed. Follow these instructions at home: If you have a splint or brace:  Wear it as told by your health care provider. Remove it only as told by your health care provider.  Loosen the splint or brace if your fingers become numb and tingle, or if they turn cold and blue.  Keep the splint or brace clean and dry. Managing pain, stiffness, and swelling  If directed, apply ice to the injured area.  Put ice in a plastic bag.  Place a towel between your skin and the bag.  Leave the ice on for 20 minutes, 2-3 times per day.  Move your fingers often to avoid stiffness and to  lessen swelling.  Raise (elevate) the injured area above the level of your heart while you are sitting or lying down. General instructions  Return to your normal activities as told by your health care provider. Ask your health care provider what activities are safe for you.  Take over-the-counter and prescription medicines only as told by your health care provider.  Keep all follow-up visits as told by your health care provider. This is important.  Do not drive or  operate heavy machinery while taking prescription pain medicine. Contact a health care provider if:  Your pain, tenderness, or swelling gets worse, even if you have had treatment.  You have numbness or tingling in your wrist, hand, or fingers on the injured side. This information is not intended to replace advice given to you by your health care provider. Make sure you discuss any questions you have with your health care provider. Document Released: 01/17/2005 Document Revised: 06/25/2015 Document Reviewed: 03/25/2014  2017 Elsevier Trigger Finger Trigger finger (digital tendinitis and stenosing tenosynovitis) is a common disorder that causes an often painful catching of the fingers or thumb. It occurs as a clicking, snapping, or locking of a finger in the palm of the hand. This is caused by a problem with the tendons that flex or bend the fingers sliding smoothly through their sheaths. The condition may occur in any finger or a couple fingers at the same time.  The finger may lock with the finger curled or suddenly straighten out with a snap. This is more common in patients with rheumatoid arthritis and diabetes. Left untreated, the condition may get worse to the point where the finger becomes locked in flexion, like making a fist, or less commonly locked with the finger straightened out. CAUSES   Inflammation and scarring that lead to swelling around the tendon sheath.  Repeated or forceful movements.  Rheumatoid arthritis, an autoimmune disease that affects joints.  Gout.  Diabetes mellitus. SIGNS AND SYMPTOMS  Soreness and swelling of your finger.  A painful clicking or snapping as you bend and straighten your finger. DIAGNOSIS  Your health care provider will do a physical exam of your finger to diagnose trigger finger. TREATMENT   Splinting for 6-8 weeks may be helpful.  Nonsteroidal anti-inflammatory medicines (NSAIDs) can help to relieve the pain and  inflammation.  Cortisone injections, along with splinting, may speed up recovery. Several injections may be required. Cortisone may give relief after one injection.  Surgery is another treatment that may be used if conservative treatments do not work. Surgery can be minor, without incisions (a cut does not have to be made), and can be done with a needle through the skin.  Other surgical choices involve an open procedure in which the surgeon opens the hand through a small incision and cuts the pulley so the tendon can again slide smoothly. Your hand will still work fine. HOME CARE INSTRUCTIONS  Apply ice to the injured area, twice per day:  Put ice in a plastic bag.  Place a towel between your skin and the bag.  Leave the ice on for 20 minutes, 3-4 times a day.  Rest your hand often. MAKE SURE YOU:   Understand these instructions.  Will watch your condition.  Will get help right away if you are not doing well or get worse. This information is not intended to replace advice given to you by your health care provider. Make sure you discuss any questions you have with your health care provider. Document  Released: 11/07/2003 Document Revised: 09/19/2012 Document Reviewed: 06/19/2012 Elsevier Interactive Patient Education  2017 [employer].

## 2016-03-07 NOTE — Progress Notes (Signed)
History and physical new patient   Patient ID: Krista Doyle, female   DOB: 1960/12/17, 56 y.o.   MRN: [id number]  Chief Complaint  Patient presents with  . Hand Problem    bilateral thumb joint pain RT>LT, RT thumb trigger finger    HPI Krista Doyle is a 56 y.o. female.  Presents for evaluation of a triggering phenomenon of the right thumb present for several weeks and then a pain and tenderness over the left first extensor compartment for several weeks  Her pain is described as dull nonradiating. On the right-sided we note association with catching and locking.  Pain on the right is moderate pain on the left is mild  Review of Systems Review of Systems  Constitutional: Negative for fever.  Skin: Negative for color change and rash.  Neurological: Positive for numbness.    Past Medical History:  Diagnosis Date  . Asthma   . Back pain   . Breast cancer ([place]) 2008   left  . Diabetes ([place])   . Hypercholesterolemia   . Neuropathy ([place])    extremities after chemo    Past Surgical History:  Procedure Laterality Date  . BACK SURGERY    . BREAST LUMPECTOMY    . COLONOSCOPY     about 3 yrs ago in [place]  . KNEE SURGERY     right  . TONSILLECTOMY      Social History Social History  Substance Use Topics  . Smoking status: Never Smoker  . Smokeless tobacco: Never Used  . Alcohol use No    No Known Allergies  No outpatient prescriptions have been marked as taking for the 03/07/16 encounter (Appointment) with Carole Civil, MD.    Current Outpatient Prescriptions:  .  albuterol (PROVENTIL HFA;VENTOLIN HFA) 108 (90 Base) MCG/ACT inhaler, Inhale 1 puff into the lungs every 6 (six) hours as needed for wheezing or shortness of breath., Disp: , Rfl:  .  atorvastatin (LIPITOR) 20 MG tablet, Take 20 mg by mouth daily., Disp: , Rfl:  .  budesonide-formoterol (SYMBICORT) 160-4.5 MCG/ACT inhaler, Inhale 2 puffs into the lungs 2 (two) times daily., Disp:  , Rfl:  .  calcium carbonate (OS-CAL - DOSED IN MG OF ELEMENTAL CALCIUM) 1250 (500 Ca) MG tablet, Take 1 tablet by mouth daily with breakfast., Disp: , Rfl:  .  celecoxib (CELEBREX) 100 MG capsule, Take 1 capsule (100 mg total) by mouth 2 (two) times daily., Disp: 30 capsule, Rfl: 1 .  ergocalciferol (VITAMIN D2) 50000 units capsule, Take 50,000 Units by mouth once a week., Disp: , Rfl:  .  fentaNYL (DURAGESIC - DOSED MCG/HR) 12 MCG/HR, Place 1 patch (12.5 mcg total) onto the skin every 3 (three) days., Disp: 10 patch, Rfl: 0 .  insulin glargine (LANTUS) 100 UNIT/ML injection, Inject 24 Units into the skin at bedtime., Disp: , Rfl:  .  letrozole (FEMARA) 2.5 MG tablet, Take 2.5 mg by mouth daily., Disp: , Rfl:  .  loratadine (CLARITIN) 10 MG tablet, Take 10 mg by mouth daily as needed for allergies., Disp: , Rfl:  .  naloxegol oxalate (MOVANTIK) 25 MG TABS tablet, Take 1 tablet (25 mg total) by mouth daily., Disp: 30 tablet, Rfl: 3 .  oxyCODONE-acetaminophen (PERCOCET) 10-325 MG tablet, Take 1 tablet by mouth every 8 (eight) hours as needed for pain., Disp: 90 tablet, Rfl: 0 .  pregabalin (LYRICA) 150 MG capsule, Take 1 capsule (150 mg total) by mouth 2 (two) times daily., Disp: 90  capsule, Rfl: 2 .  sitaGLIPtin (JANUVIA) 100 MG tablet, Take 100 mg by mouth daily., Disp: , Rfl:  .  valsartan (DIOVAN) 160 MG tablet, Take 160 mg by mouth daily., Disp: , Rfl:  No current facility-administered medications for this visit.   Facility-Administered Medications Ordered in Other Visits:  .  0.9 %  sodium chloride infusion, , Intravenous, Once, Patrici Ranks, MD   Physical Exam Physical Exam BP (!) 139/91   Pulse 97   Wt 148 lb (67.1 kg)   BMI 25.40 kg/m   Gen. appearance. The patient is well-developed and well-nourished, grooming and hygiene are normal. There are no gross congenital abnormalities  The patient is alert and oriented to person place and time  Mood and affect are  normal  Ambulation cane supported  Examination reveals the following: On inspection we find tenderness over the A1 pulley of the right thumb with no swelling. Full range of motion without catching. Normal stability of the metacarpophalangeal and IP joints of the right thumb. Flexor tendon strength grade 5 skin normal and sensation normal as well  On the left side we have tenderness over the first extensor compartment normal range of motion with painful ulnar deviation positive Finkelstein test normal wrist strength and normal stability. No skin rashes noted on the left wrist or thumb   Data Reviewed X-ray was done of the thumb and it did not show any abnormalities I reviewed the x-ray and the report  Assessment    Encounter Diagnoses  Name Primary?  Tennis Must Quervain's disease (radial styloid tenosynovitis)-left Yes  . Trigger thumb of right hand    Right Trigger thumb injection Medication  1 mL of 40 mg Depo-Medrol  2 mL of 1% lidocaine plain  Ethyl chloride for anesthesia  Verbal consent was obtained timeout was taken to confirm the injection site as right thumb  Alcohol was used to prepare the skin along with ethyl chloride and then the injection was made at the A1 pulley there were no complications      Plan Splint left wrist with ice 3 times a day splint for 6 weeks    Fu as needed        [employer] 03/07/2016, 10:17 AM

## 2016-03-14 ENCOUNTER — Other Ambulatory Visit (HOSPITAL_COMMUNITY): Payer: BLUE CROSS/BLUE SHIELD

## 2016-03-14 ENCOUNTER — Ambulatory Visit (HOSPITAL_COMMUNITY): Payer: BLUE CROSS/BLUE SHIELD

## 2016-03-15 ENCOUNTER — Encounter (HOSPITAL_COMMUNITY): Payer: BLUE CROSS/BLUE SHIELD | Attending: Oncology

## 2016-03-15 ENCOUNTER — Ambulatory Visit (HOSPITAL_COMMUNITY)
Admission: RE | Admit: 2016-03-15 | Discharge: 2016-03-15 | Disposition: A | Discharge status: Home / Self Care | Payer: BLUE CROSS/BLUE SHIELD | Source: Ambulatory Visit | Attending: Hematology & Oncology | Admitting: Hematology & Oncology

## 2016-03-15 ENCOUNTER — Ambulatory Visit (HOSPITAL_COMMUNITY): Payer: BLUE CROSS/BLUE SHIELD

## 2016-03-15 ENCOUNTER — Other Ambulatory Visit (HOSPITAL_COMMUNITY): Payer: Self-pay | Admitting: Hematology & Oncology

## 2016-03-15 DIAGNOSIS — R928 Other abnormal and inconclusive findings on diagnostic imaging of breast: Secondary | ICD-10-CM

## 2016-03-15 DIAGNOSIS — C50412 Malignant neoplasm of upper-outer quadrant of left female breast: Secondary | ICD-10-CM | POA: Diagnosis not present

## 2016-03-15 DIAGNOSIS — Z170 Estrogen receptor positive status [ER+]: Secondary | ICD-10-CM | POA: Diagnosis not present

## 2016-03-15 DIAGNOSIS — N6312 Unspecified lump in the right breast, upper inner quadrant: Secondary | ICD-10-CM | POA: Diagnosis not present

## 2016-03-15 DIAGNOSIS — N6310 Unspecified lump in the right breast, unspecified quadrant: Secondary | ICD-10-CM | POA: Diagnosis present

## 2016-03-15 DIAGNOSIS — N6311 Unspecified lump in the right breast, upper outer quadrant: Secondary | ICD-10-CM | POA: Diagnosis not present

## 2016-03-15 DIAGNOSIS — R590 Localized enlarged lymph nodes: Secondary | ICD-10-CM | POA: Diagnosis not present

## 2016-03-15 LAB — COMPREHENSIVE METABOLIC PANEL
ALT: 12 U/L — ABNORMAL LOW (ref 14–54)
AST: 15 U/L (ref 15–41)
Albumin: 4 g/dL (ref 3.5–5.0)
Alkaline Phosphatase: 41 U/L (ref 38–126)
Anion gap: 5 (ref 5–15)
BUN: 11 mg/dL (ref 6–20)
CO2: 30 mmol/L (ref 22–32)
Calcium: 9.7 mg/dL (ref 8.9–10.3)
Chloride: 102 mmol/L (ref 101–111)
Creatinine, Ser: 0.72 mg/dL (ref 0.44–1.00)
GFR calc Af Amer: >60 mL/min (ref >60)
GFR calc non Af Amer: >60 mL/min (ref >60)
Glucose, Bld: 102 mg/dL — ABNORMAL HIGH (ref 65–99)
Potassium: 3.8 mmol/L (ref 3.5–5.1)
Sodium: 137 mmol/L (ref 135–145)
Total Bilirubin: 0.4 mg/dL (ref 0.3–1.2)
Total Protein: 8 g/dL (ref 6.5–8.1)

## 2016-03-15 LAB — CBC WITH DIFFERENTIAL/PLATELET
Basophils Absolute: 0 10*3/uL (ref 0.0–0.1)
Basophils Relative: 0 %
Eosinophils Absolute: 0.1 10*3/uL (ref 0.0–0.7)
Eosinophils Relative: 1 %
HCT: 40.3 % (ref 36.0–46.0)
Hemoglobin: 13.2 g/dL (ref 12.0–15.0)
Lymphocytes Relative: 37 %
Lymphs Abs: 2.8 10*3/uL (ref 0.7–4.0)
MCH: 26.8 pg (ref 26.0–34.0)
MCHC: 32.8 g/dL (ref 30.0–36.0)
MCV: 81.9 fL (ref 78.0–100.0)
Monocytes Absolute: 0.5 10*3/uL (ref 0.1–1.0)
Monocytes Relative: 6 %
Neutro Abs: 4.2 10*3/uL (ref 1.7–7.7)
Neutrophils Relative %: 56 %
Platelets: 301 10*3/uL (ref 150–400)
RBC: 4.92 MIL/uL (ref 3.87–5.11)
RDW: 13.3 % (ref 11.5–15.5)
WBC: 7.6 10*3/uL (ref 4.0–10.5)

## 2016-03-16 ENCOUNTER — Encounter (HOSPITAL_COMMUNITY): Payer: BLUE CROSS/BLUE SHIELD | Attending: Adult Health

## 2016-03-16 ENCOUNTER — Other Ambulatory Visit (HOSPITAL_COMMUNITY): Payer: Self-pay | Admitting: Oncology

## 2016-03-16 VITALS — BP 115/79 | HR 88 | Temp 98.1°F | Resp 18

## 2016-03-16 DIAGNOSIS — M8580 Other specified disorders of bone density and structure, unspecified site: Secondary | ICD-10-CM | POA: Diagnosis not present

## 2016-03-16 DIAGNOSIS — E559 Vitamin D deficiency, unspecified: Secondary | ICD-10-CM | POA: Diagnosis not present

## 2016-03-16 DIAGNOSIS — Z79899 Other long term (current) drug therapy: Secondary | ICD-10-CM

## 2016-03-16 DIAGNOSIS — C50412 Malignant neoplasm of upper-outer quadrant of left female breast: Secondary | ICD-10-CM

## 2016-03-16 DIAGNOSIS — Z170 Estrogen receptor positive status [ER+]: Secondary | ICD-10-CM

## 2016-03-16 MED ORDER — SODIUM CHLORIDE 0.9 % IV SOLN: Freq: Once | INTRAVENOUS | Status: DC

## 2016-03-16 MED ORDER — DENOSUMAB 60 MG/ML ~~LOC~~ SOLN
60.0000 mg | Freq: Once | SUBCUTANEOUS | Status: AC
  Administered 2016-03-16: 60 mg via SUBCUTANEOUS
  Filled 2016-03-16: qty 1

## 2016-03-16 NOTE — Patient Instructions (Signed)
[facility] at [facility] Discharge Instructions  RECOMMENDATIONS MADE BY THE CONSULTANT AND ANY TEST RESULTS WILL BE SENT TO YOUR REFERRING PHYSICIAN.  Prolia 60 mg injection given as ordered. Return as scheduled.  Thank you for choosing [facility] at [facility] to provide your oncology and hematology care.  To afford each patient quality time with our provider, please arrive at least 15 minutes before your scheduled appointment time.    If you have a lab appointment with the Cancer Center please come in thru the  Main Entrance and check in at the main information desk  You need to re-schedule your appointment should you arrive 10 or more minutes late.  We strive to give you quality time with our providers, and arriving late affects you and other patients whose appointments are after yours.  Also, if you no show three or more times for appointments you may be dismissed from the clinic at the providers discretion.     Again, thank you for choosing [facility].  Our hope is that these requests will decrease the amount of time that you wait before being seen by our physicians.       _____________________________________________________________  Should you have questions after your visit to  Cancer Center, please contact our office at [phone] between the hours of 8:30 a.m. and 4:30 p.m.  Voicemails left after 4:30 p.m. will not be returned until the following business day.  For prescription refill requests, have your pharmacy contact our office.       Resources For Cancer Patients and their Caregivers ? American Cancer Society: Can assist with transportation, wigs, general needs, runs Look Good Feel Better.        1-888-227-6333 ? Cancer Care: Provides financial assistance, online support groups, medication/co-pay assistance.  1-800-813-HOPE (4673) ? [facility] Assists [place] cancer  patients and their families through emotional , educational and financial support.  336-427-4357 ? [place] DSS Where to apply for food stamps, Medicaid and utility assistance. 336-342-1394 ? [facility]: Transportation to medical appointments. 336-347-2287 ? Social Security Administration: May apply for disability if have a Stage IV cancer. 336-342-7796 1-800-772-1213 ? [place] Aging, Disability and Transit Services: Assists with nutrition, care and transit needs. 336-349-2343  Cancer Center Support Programs: @[email or web address]@ > Cancer Support Group  2nd Tuesday of the month 1pm-2pm, Journey Room  > Creative Journey  3rd Tuesday of the month 1130am-1pm, Journey Room  > Look Good Feel Better  1st Wednesday of the month 10am-12 noon, Journey Room (Call American Cancer Society to register 1-800-395-5775)   

## 2016-03-16 NOTE — Progress Notes (Signed)
Krista Doyle presents today for injection per MD orders. Prolia 60 mg administered SQ in right Upper Arm. Administration without incident. Patient tolerated well.

## 2016-03-18 ENCOUNTER — Ambulatory Visit
Admission: RE | Admit: 2016-03-18 | Discharge: 2016-03-18 | Disposition: A | Discharge status: Home / Self Care | Payer: BLUE CROSS/BLUE SHIELD | Source: Ambulatory Visit | Attending: Oncology | Admitting: Oncology

## 2016-03-18 ENCOUNTER — Other Ambulatory Visit (HOSPITAL_COMMUNITY): Payer: Self-pay | Admitting: Hematology & Oncology

## 2016-03-18 DIAGNOSIS — N6311 Unspecified lump in the right breast, upper outer quadrant: Secondary | ICD-10-CM | POA: Diagnosis not present

## 2016-03-18 DIAGNOSIS — R590 Localized enlarged lymph nodes: Secondary | ICD-10-CM | POA: Diagnosis not present

## 2016-03-18 DIAGNOSIS — R928 Other abnormal and inconclusive findings on diagnostic imaging of breast: Secondary | ICD-10-CM

## 2016-03-18 DIAGNOSIS — N6031 Fibrosclerosis of right breast: Secondary | ICD-10-CM | POA: Diagnosis not present

## 2016-03-24 DIAGNOSIS — I10 Essential (primary) hypertension: Secondary | ICD-10-CM | POA: Diagnosis not present

## 2016-03-24 DIAGNOSIS — M5126 Other intervertebral disc displacement, lumbar region: Secondary | ICD-10-CM | POA: Diagnosis not present

## 2016-03-24 DIAGNOSIS — J4520 Mild intermittent asthma, uncomplicated: Secondary | ICD-10-CM | POA: Diagnosis not present

## 2016-03-24 DIAGNOSIS — E119 Type 2 diabetes mellitus without complications: Secondary | ICD-10-CM | POA: Diagnosis not present

## 2016-03-28 ENCOUNTER — Encounter: Payer: BLUE CROSS/BLUE SHIELD | Attending: Physical Medicine & Rehabilitation | Admitting: Registered Nurse

## 2016-03-28 ENCOUNTER — Encounter: Payer: Self-pay | Admitting: Registered Nurse

## 2016-03-28 VITALS — BP 125/89 | HR 93 | Resp 14

## 2016-03-28 DIAGNOSIS — Z853 Personal history of malignant neoplasm of breast: Secondary | ICD-10-CM | POA: Insufficient documentation

## 2016-03-28 DIAGNOSIS — Z79899 Other long term (current) drug therapy: Secondary | ICD-10-CM | POA: Diagnosis not present

## 2016-03-28 DIAGNOSIS — T451X5A Adverse effect of antineoplastic and immunosuppressive drugs, initial encounter: Secondary | ICD-10-CM

## 2016-03-28 DIAGNOSIS — Z5181 Encounter for therapeutic drug level monitoring: Secondary | ICD-10-CM | POA: Diagnosis not present

## 2016-03-28 DIAGNOSIS — M47816 Spondylosis without myelopathy or radiculopathy, lumbar region: Secondary | ICD-10-CM | POA: Diagnosis not present

## 2016-03-28 DIAGNOSIS — M5416 Radiculopathy, lumbar region: Secondary | ICD-10-CM

## 2016-03-28 DIAGNOSIS — Z9221 Personal history of antineoplastic chemotherapy: Secondary | ICD-10-CM | POA: Diagnosis not present

## 2016-03-28 DIAGNOSIS — G8929 Other chronic pain: Secondary | ICD-10-CM | POA: Diagnosis not present

## 2016-03-28 DIAGNOSIS — M545 Low back pain: Secondary | ICD-10-CM | POA: Diagnosis not present

## 2016-03-28 DIAGNOSIS — G620 Drug-induced polyneuropathy: Secondary | ICD-10-CM | POA: Diagnosis not present

## 2016-03-28 DIAGNOSIS — G894 Chronic pain syndrome: Secondary | ICD-10-CM

## 2016-03-28 DIAGNOSIS — I890 Lymphedema, not elsewhere classified: Secondary | ICD-10-CM | POA: Diagnosis not present

## 2016-03-28 MED ORDER — OXYCODONE-ACETAMINOPHEN 10-325 MG PO TABS: 1.0000 | ORAL_TABLET | Freq: Three times a day (TID) | ORAL | 0 refills | Status: DC | PRN

## 2016-03-28 MED ORDER — FENTANYL 12 MCG/HR TD PT72: 12.5000 ug | MEDICATED_PATCH | TRANSDERMAL | 0 refills | Status: DC

## 2016-03-28 NOTE — Progress Notes (Signed)
Subjective:    Patient ID: Krista Doyle, female    DOB: October 26, 1960, 56 y.o.   MRN: [id number]  HPI: Ms. Jetty- Ravon Renney is a 56year old female who returns for follow up appointmentfor chronic pain and medication refill. She states her pain is located in her lower back radiating into her right lower extremity anteriorly. She rates her pain 6. Her current exercise regime is performing stretching exercisesand walking.  Pain Inventory Average Pain 8 Pain Right Now 6 My pain is not answered  In the last 24 hours, has pain interfered with the following? General activity 7 Relation with others 7 Enjoyment of life 7 What TIME of day is your pain at its worst? evening and night Sleep (in general) Fair  Pain is worse with: walking, bending and sitting Pain improves with: medication Relief from Meds: 6  Mobility use a cane use a walker how many minutes can you walk? 15 do you drive?  yes  Function disabled: date disabled .  Neuro/Psych weakness numbness tingling  Prior Studies Any changes since last visit?  no  Physicians involved in your care Any changes since last visit?  no   Family History  Problem Relation Age of Onset  . Colon cancer Neg Hx    Social History   Social History  . Marital status: Married    Spouse name: N/A  . Number of children: N/A  . Years of education: N/A   Occupational History  . Disabled    Social History Main Topics  . Smoking status: Never Smoker  . Smokeless tobacco: Never Used  . Alcohol use No  . Drug use: No  . Sexual activity: Yes   Other Topics Concern  . None   Social History Narrative  . None   Past Surgical History:  Procedure Laterality Date  . BACK SURGERY    . BREAST LUMPECTOMY    . COLONOSCOPY     about 3 yrs ago in [place]  . KNEE SURGERY     right  . TONSILLECTOMY     Past Medical History:  Diagnosis Date  . Asthma   . Back pain   . Breast cancer ([place]) 2008   left  .  Diabetes ([place])   . Hypercholesterolemia   . Neuropathy ([facility])    extremities after chemo   BP 125/89   Pulse 93   Resp 14   SpO2 98%   Opioid Risk Score:   Fall Risk Score:  `1  Depression screen PHQ 2/9  Depression screen [facility] 2/9 03/28/2016 10/07/2015 08/31/2015 07/06/2015 04/28/2015 02/11/2015  Decreased Interest 0 0 1 0 1 1  Down, Depressed, Hopeless 0 0 0 0 0 0  PHQ - 2 Score 0 0 1 0 1 1  Altered sleeping - - - - - 3  Tired, decreased energy - - - - - 0  Change in appetite - - - - - 0  Feeling bad or failure about yourself  - - - - - 0  Trouble concentrating - - - - - 0  Moving slowly or fidgety/restless - - - - - 0  Suicidal thoughts - - - - - 0  PHQ-9 Score - - - - - 4  Difficult doing work/chores - - - - - Somewhat difficult   Review of Systems  HENT: Negative.   Eyes: Negative.   Respiratory: Negative.   Cardiovascular: Positive for leg swelling.  Gastrointestinal: Negative.   Endocrine: Negative.   Genitourinary:  Negative.   Musculoskeletal: Negative.   Skin: Negative.   Neurological: Positive for weakness and numbness.  Hematological: Negative.   Psychiatric/Behavioral: Negative.   All other systems reviewed and are negative.      Objective:   Physical Exam  Constitutional: She is oriented to person, place, and time. She appears well-developed and well-nourished.  HENT:  Head: Normocephalic and atraumatic.  Neck: Normal range of motion. Neck supple.  Cardiovascular: Normal rate and regular rhythm.   Pulmonary/Chest: Effort normal and breath sounds normal.  Musculoskeletal:  Normal Muscle Bulk and Muscle Testing Reveals: Upper Extremities: Right: Full ROM and Muscle Strength 5/5 Left: Decreased ROM 45 Degrees and Muscle Strength 5/5 Lumbar Hypersensitivity Lower Extremities: Full ROM and Muscle Strength 5/5 Arises from Table slowly using straight cane for support Narrow Based Gait   Neurological: She is alert and oriented to person, place, and time.    Skin: Skin is warm and dry.  Psychiatric: She has a normal mood and affect.  Nursing note and vitals reviewed.         Assessment & Plan:  1. Chronic low back pain, lumbar spondylosis, spondylolisthesis, with lumbar radiculopathy.03/28/2016 Refilled: Fentanyl 12 mcg one patch every three days #10 and Oxycodone 10/325 mg one tablet every 8 hours as needed for pain #90.  We will continue the opioid monitoring program, this consists of regular clinic visits, examinations, urine drug screen, pill counts as well as use of [place] Controlled Substance reporting System. 2. Chemotherapy induced peripheral neuropathy: 03/28/2016 Continue Lyrica at 150mg  TID   20 minutes of face to face patient care time was spent during this visit. All questions were encouraged and answered.   F/U in 1 month

## 2016-03-30 DIAGNOSIS — M545 Low back pain: Secondary | ICD-10-CM | POA: Diagnosis not present

## 2016-03-30 DIAGNOSIS — M542 Cervicalgia: Secondary | ICD-10-CM | POA: Diagnosis not present

## 2016-03-30 DIAGNOSIS — J4520 Mild intermittent asthma, uncomplicated: Secondary | ICD-10-CM | POA: Diagnosis not present

## 2016-03-30 DIAGNOSIS — Z0001 Encounter for general adult medical examination with abnormal findings: Secondary | ICD-10-CM | POA: Diagnosis not present

## 2016-03-30 DIAGNOSIS — I10 Essential (primary) hypertension: Secondary | ICD-10-CM | POA: Diagnosis not present

## 2016-03-30 DIAGNOSIS — E119 Type 2 diabetes mellitus without complications: Secondary | ICD-10-CM | POA: Diagnosis not present

## 2016-04-01 ENCOUNTER — Encounter (HOSPITAL_COMMUNITY): Payer: Self-pay

## 2016-04-01 LAB — TOXASSURE SELECT,+ANTIDEPR,UR

## 2016-04-18 ENCOUNTER — Ambulatory Visit: Payer: BLUE CROSS/BLUE SHIELD | Admitting: Orthopedic Surgery

## 2016-04-27 ENCOUNTER — Encounter: Payer: Self-pay | Admitting: Physical Medicine & Rehabilitation

## 2016-04-27 ENCOUNTER — Encounter
Payer: BLUE CROSS/BLUE SHIELD | Attending: Physical Medicine & Rehabilitation | Admitting: Physical Medicine & Rehabilitation

## 2016-04-27 VITALS — BP 126/89 | HR 85

## 2016-04-27 DIAGNOSIS — G620 Drug-induced polyneuropathy: Secondary | ICD-10-CM | POA: Insufficient documentation

## 2016-04-27 DIAGNOSIS — Z9221 Personal history of antineoplastic chemotherapy: Secondary | ICD-10-CM | POA: Insufficient documentation

## 2016-04-27 DIAGNOSIS — I890 Lymphedema, not elsewhere classified: Secondary | ICD-10-CM | POA: Diagnosis not present

## 2016-04-27 DIAGNOSIS — G8929 Other chronic pain: Secondary | ICD-10-CM | POA: Diagnosis not present

## 2016-04-27 DIAGNOSIS — M47816 Spondylosis without myelopathy or radiculopathy, lumbar region: Secondary | ICD-10-CM | POA: Insufficient documentation

## 2016-04-27 DIAGNOSIS — Z853 Personal history of malignant neoplasm of breast: Secondary | ICD-10-CM | POA: Insufficient documentation

## 2016-04-27 DIAGNOSIS — M1288 Other specific arthropathies, not elsewhere classified, other specified site: Secondary | ICD-10-CM | POA: Diagnosis not present

## 2016-04-27 DIAGNOSIS — T451X5A Adverse effect of antineoplastic and immunosuppressive drugs, initial encounter: Secondary | ICD-10-CM

## 2016-04-27 DIAGNOSIS — M5416 Radiculopathy, lumbar region: Secondary | ICD-10-CM

## 2016-04-27 DIAGNOSIS — M545 Low back pain: Secondary | ICD-10-CM | POA: Insufficient documentation

## 2016-04-27 MED ORDER — FENTANYL 12 MCG/HR TD PT72: 12.5000 ug | MEDICATED_PATCH | TRANSDERMAL | 0 refills | Status: DC

## 2016-04-27 MED ORDER — OXYCODONE-ACETAMINOPHEN 10-325 MG PO TABS: 1.0000 | ORAL_TABLET | Freq: Three times a day (TID) | ORAL | 0 refills | Status: DC | PRN

## 2016-04-27 MED ORDER — PREGABALIN 150 MG PO CAPS: 150.0000 mg | ORAL_CAPSULE | Freq: Two times a day (BID) | ORAL | 4 refills | Status: DC

## 2016-04-27 NOTE — Progress Notes (Signed)
Subjective:    Patient ID: Krista Doyle, female    DOB: May 30, 1960, 56 y.o.   MRN: [id number]  HPI   Krista Doyle is here in follow up of her chronic pain. Her pain still comes and goes. It is worse with the colder weather. Today for some reason the pain seems a little more intense. It bothers her to sit or stand in one position for too long.   She reports a recent a recent near fall at home when she tripped at home. She states that she felt better when she was walking on the treadmill. She hasn't had access to one since finishing therapy. She is doing some short distance walking at home. She uses a cane for balance. She is doing stretching too.   She maintains on fentanyl and oxycodone for pain control with relief.    Pain Inventory Average Pain 8 Pain Right Now 7 My pain is constant  In the last 24 hours, has pain interfered with the following? General activity 8 Relation with others 7 Enjoyment of life 8 What TIME of day is your pain at its worst? evening Sleep (in general) Poor  Pain is worse with: walking, bending, sitting and standing Pain improves with: medication Relief from Meds: 7  Mobility walk with assistance use a cane use a walker ability to climb steps?  yes do you drive?  yes  Function disabled: date disabled .  Neuro/Psych weakness numbness tingling  Prior Studies Any changes since last visit?  no  Physicians involved in your care Any changes since last visit?  no   Family History  Problem Relation Age of Onset  . Colon cancer Neg Hx    Social History   Social History  . Marital status: Married    Spouse name: N/A  . Number of children: N/A  . Years of education: N/A   Occupational History  . Disabled    Social History Main Topics  . Smoking status: Never Smoker  . Smokeless tobacco: Never Used  . Alcohol use No  . Drug use: No  . Sexual activity: Yes   Other Topics Concern  . Not on file   Social History  Narrative  . No narrative on file   Past Surgical History:  Procedure Laterality Date  . BACK SURGERY    . BREAST LUMPECTOMY    . COLONOSCOPY     about 3 yrs ago in [place]  . KNEE SURGERY     right  . TONSILLECTOMY     Past Medical History:  Diagnosis Date  . Asthma   . Back pain   . Breast cancer ([place]) 2008   left  . Diabetes ([place])   . Hypercholesterolemia   . Neuropathy ([place])    extremities after chemo   There were no vitals taken for this visit.  Opioid Risk Score:   Fall Risk Score:  `1  Depression screen PHQ 2/9  Depression screen [facility] 2/9 03/28/2016 10/07/2015 08/31/2015 07/06/2015 04/28/2015 02/11/2015  Decreased Interest 0 0 1 0 1 1  Down, Depressed, Hopeless 0 0 0 0 0 0  PHQ - 2 Score 0 0 1 0 1 1  Altered sleeping - - - - - 3  Tired, decreased energy - - - - - 0  Change in appetite - - - - - 0  Feeling bad or failure about yourself  - - - - - 0  Trouble concentrating - - - - - 0  Moving slowly or fidgety/restless - - - - - 0  Suicidal thoughts - - - - - 0  PHQ-9 Score - - - - - 4  Difficult doing work/chores - - - - - Somewhat difficult     Review of Systems  Constitutional: Negative.   HENT: Positive for congestion.   Eyes: Negative.   Respiratory: Negative.   Cardiovascular: Negative.   Gastrointestinal: Negative.   Endocrine: Negative.   Genitourinary: Negative.   Musculoskeletal: Negative.   Skin: Negative.   Allergic/Immunologic: Negative.   Neurological: Negative.   Hematological: Negative.   Psychiatric/Behavioral: Negative.   All other systems reviewed and are negative.      Objective:   Physical Exam  General: Alert and oriented x 3, No apparent distress. Well dressed  HEENT:Head is normocephalic, atraumatic, PERRLA, EOMI, sclera anicteric, oral mucosa pink and moist, dentition intact, ext ear canals clear,  Neck:Supple without JVD or lymphadenopathy  Heart:RRR  Chest:CTA B  Abdomen:Soft, non-tender, non-distended, bowel  sounds positive.  Extremities:No clubbing, cyanosis, or edema. Pulses are 2+  Skin:Clean and intact without signs of breakdown  Neuro:cognitively intact. Sensory decreased in distal limbs.  Musculoskeletal:limted flexion and extension. Low back TTP Psych:Pt's affect is appropriate. Pt is cooperative and pleasant    Assessment & Plan:  1. Chronic low back pain, lumbar spondylosis, spondylolisthesis, likely lumbar-sacral radiculopathy, and facet arthropathy -has had history of discectomy  -pt with multi-level facet disease--had positive results with MBB's -continue percocet 10/325- two to three tabs daily prn #75  -continue fentanyl patch 59mcg for more continuous pain relief, #10  -discussed the importance of HEP.  -continue lyrica for any radicular component to pain--refilled today We will continue the opioid monitoring program, this consists of regular clinic visits, examinations, urine drug screen, pill counts as well as use of [place] Controlled Substance Reporting System. [place] was [place] today.     2. Chemotherapy induced peripheral neuropathy  -lyrica at 150mg  TID has been effective--  3. Breast Cancer in remission.  25 minutes of face to face patient care time were spent during this visit. All questions were encouraged and answered. We'll see her back in about a month.

## 2016-04-27 NOTE — Patient Instructions (Signed)
PLEASE FEEL FREE TO CALL OUR OFFICE WITH ANY PROBLEMS OR QUESTIONS ([phone])      

## 2016-04-28 DIAGNOSIS — M545 Low back pain: Secondary | ICD-10-CM | POA: Diagnosis not present

## 2016-04-28 DIAGNOSIS — M542 Cervicalgia: Secondary | ICD-10-CM | POA: Diagnosis not present

## 2016-05-04 ENCOUNTER — Encounter: Payer: Self-pay | Admitting: Gastroenterology

## 2016-05-09 ENCOUNTER — Encounter: Payer: Self-pay | Admitting: Orthopedic Surgery

## 2016-05-09 ENCOUNTER — Ambulatory Visit (INDEPENDENT_AMBULATORY_CARE_PROVIDER_SITE_OTHER): Payer: BLUE CROSS/BLUE SHIELD | Admitting: Orthopedic Surgery

## 2016-05-09 DIAGNOSIS — M654 Radial styloid tenosynovitis [de Quervain]: Secondary | ICD-10-CM | POA: Diagnosis not present

## 2016-05-09 DIAGNOSIS — M65311 Trigger thumb, right thumb: Secondary | ICD-10-CM | POA: Diagnosis not present

## 2016-05-09 NOTE — Progress Notes (Signed)
Patient ID: Krista Doyle, female   DOB: 1960/05/27, 56 y.o.   MRN: [id number]  Chief Complaint  Patient presents with  . Follow-up    RIGHT DQV    HPI Krista Doyle is a 56 y.o. female.   Encounter Diagnoses  Name Primary?  . Trigger thumb of right hand Yes  . De Quervain's disease (radial styloid tenosynovitis)     The patient has triggering of the right thumb and the right ring and long finger  She's been treated with bracing and injection and she has improved significantly however on the left side her symptoms have gotten worse with painful ulnar deviation and difficulty picking up small and heavy objects. It is a dull burning pain over the first extensor compartment now present for over one month   Review of Systems Review of Systems  Constitutional: Negative for unexpected weight change.  Musculoskeletal: Negative for arthralgias.  Neurological: Negative for weakness.   (2 MINIMUM)  Past Medical History:  Diagnosis Date  . Asthma   . Back pain   . Breast cancer ([place]) 2008   left  . Diabetes ([place])   . Hypercholesterolemia   . Neuropathy ([place])    extremities after chemo    Past Surgical History:  Procedure Laterality Date  . BACK SURGERY    . BREAST LUMPECTOMY    . COLONOSCOPY     about 3 yrs ago in [place]  . KNEE SURGERY     right  . TONSILLECTOMY      Social History Social History  Substance Use Topics  . Smoking status: Never Smoker  . Smokeless tobacco: Never Used  . Alcohol use No    No Known Allergies  Current Meds  Medication Sig  . albuterol (PROVENTIL HFA;VENTOLIN HFA) 108 (90 Base) MCG/ACT inhaler Inhale 1 puff into the lungs every 6 (six) hours as needed for wheezing or shortness of breath.  Marland Kitchen atorvastatin (LIPITOR) 20 MG tablet Take 20 mg by mouth daily.  . budesonide-formoterol (SYMBICORT) 160-4.5 MCG/ACT inhaler Inhale 2 puffs into the lungs 2 (two) times daily.  . calcium carbonate (OS-CAL - DOSED IN MG OF  ELEMENTAL CALCIUM) 1250 (500 Ca) MG tablet Take 1 tablet by mouth daily with breakfast.  . celecoxib (CELEBREX) 100 MG capsule Take 1 capsule (100 mg total) by mouth 2 (two) times daily.  . ergocalciferol (VITAMIN D2) 50000 units capsule Take 50,000 Units by mouth once a week.  . fentaNYL (DURAGESIC - DOSED MCG/HR) 12 MCG/HR Place 1 patch (12.5 mcg total) onto the skin every 3 (three) days.  . insulin glargine (LANTUS) 100 UNIT/ML injection Inject 24 Units into the skin at bedtime.  Marland Kitchen letrozole (FEMARA) 2.5 MG tablet Take 2.5 mg by mouth daily.  Marland Kitchen loratadine (CLARITIN) 10 MG tablet Take 10 mg by mouth daily as needed for allergies.  . Multiple Vitamins-Minerals (ICAPS AREDS 2) CAPS Take by mouth.  . naloxegol oxalate (MOVANTIK) 25 MG TABS tablet Take 1 tablet (25 mg total) by mouth daily.  Marland Kitchen oxyCODONE-acetaminophen (PERCOCET) 10-325 MG tablet Take 1 tablet by mouth every 8 (eight) hours as needed for pain.  . pregabalin (LYRICA) 150 MG capsule Take 1 capsule (150 mg total) by mouth 2 (two) times daily.  . sitaGLIPtin (JANUVIA) 100 MG tablet Take 100 mg by mouth daily.  . valsartan (DIOVAN) 160 MG tablet Take 160 mg by mouth daily.      Physical Exam Physical Exam 1.There were no vitals taken for this visit.  2. Gen. appearance. The patient is well-developed and well-nourished, grooming and hygiene are normal. There are no gross congenital abnormalities  3. The patient is alert and oriented to person place and time  4. Mood and affect are normal  5. Ambulation Is normal without limitation Examination reveals the following: 6. On inspection we find tenderness over the A1 pulley of the right ring finger long finger and thumb but not of any significance. There is no catching or locking of the thumb with a long finger there is mild catching of the ring finger  The tenderness over the A1 pulley has resolved and she has no pain on ulnar deviation. The wrist remain stable she has normal flexion  strength in the small digits of the hand including the thumb skin is otherwise normal  Shows a good pulse in the distal radial artery as well as in the capillary refill of the hand and digits   On the left side we have a positive wrist ulnar deviation testing tenderness over the first extensor compartment   MEDICAL DECISION MAKING:    Data Reviewed   Assessment Encounter Diagnoses  Name Primary?  . Trigger thumb of right hand Yes  . De Quervain's disease (radial styloid tenosynovitis)    Harriet Pho disease was resolved on the right symptomatic on the left triggering of the right thumb right long finger and right ring finger mild disease      Plan Injection left wrist first extensor compartment She gave [place] verbal consent We confirmed site of injection We injected Depo-Medrol 40 mg and 2 mL 1% lidocaine was well tolerated  Patient advised to wear splint and use Aspercreme 3 times a day and call us back if no improvement after 4 weeks  Arther Abbott 05/09/2016, 12:11 PM

## 2016-05-09 NOTE — Patient Instructions (Signed)
You have received an injection of steroids into the joint. 15% of patients will have increased pain within the 24 hours postinjection.   This is transient and will go away.   We recommend that you use ice packs on the injection site for 20 minutes every 2 hours and extra strength Tylenol 2 tablets every 8 as needed until the pain resolves.  If you continue to have pain after taking the Tylenol and using the ice please call the office for further instructions.  APPLY ASPERCREME 3 X A DAY   WEAR SPLINT FOR 4 WEEKS

## 2016-05-27 ENCOUNTER — Encounter: Payer: BLUE CROSS/BLUE SHIELD | Attending: Physical Medicine & Rehabilitation | Admitting: Registered Nurse

## 2016-05-27 ENCOUNTER — Encounter: Payer: Self-pay | Admitting: Registered Nurse

## 2016-05-27 VITALS — BP 125/86 | HR 91

## 2016-05-27 DIAGNOSIS — Z9221 Personal history of antineoplastic chemotherapy: Secondary | ICD-10-CM | POA: Insufficient documentation

## 2016-05-27 DIAGNOSIS — G8929 Other chronic pain: Secondary | ICD-10-CM | POA: Insufficient documentation

## 2016-05-27 DIAGNOSIS — Z79899 Other long term (current) drug therapy: Secondary | ICD-10-CM | POA: Diagnosis not present

## 2016-05-27 DIAGNOSIS — I890 Lymphedema, not elsewhere classified: Secondary | ICD-10-CM | POA: Insufficient documentation

## 2016-05-27 DIAGNOSIS — M545 Low back pain: Secondary | ICD-10-CM | POA: Insufficient documentation

## 2016-05-27 DIAGNOSIS — Z5181 Encounter for therapeutic drug level monitoring: Secondary | ICD-10-CM | POA: Diagnosis not present

## 2016-05-27 DIAGNOSIS — G894 Chronic pain syndrome: Secondary | ICD-10-CM | POA: Diagnosis not present

## 2016-05-27 DIAGNOSIS — M5416 Radiculopathy, lumbar region: Secondary | ICD-10-CM

## 2016-05-27 DIAGNOSIS — Z853 Personal history of malignant neoplasm of breast: Secondary | ICD-10-CM | POA: Insufficient documentation

## 2016-05-27 DIAGNOSIS — M4696 Unspecified inflammatory spondylopathy, lumbar region: Secondary | ICD-10-CM | POA: Diagnosis not present

## 2016-05-27 DIAGNOSIS — T451X5A Adverse effect of antineoplastic and immunosuppressive drugs, initial encounter: Secondary | ICD-10-CM

## 2016-05-27 DIAGNOSIS — M47816 Spondylosis without myelopathy or radiculopathy, lumbar region: Secondary | ICD-10-CM

## 2016-05-27 DIAGNOSIS — G620 Drug-induced polyneuropathy: Secondary | ICD-10-CM

## 2016-05-27 MED ORDER — FENTANYL 12 MCG/HR TD PT72: 12.5000 ug | MEDICATED_PATCH | TRANSDERMAL | 0 refills | Status: DC

## 2016-05-27 MED ORDER — OXYCODONE-ACETAMINOPHEN 10-325 MG PO TABS: 1.0000 | ORAL_TABLET | Freq: Three times a day (TID) | ORAL | 0 refills | Status: DC | PRN

## 2016-05-27 NOTE — Progress Notes (Signed)
Subjective:    Patient ID: Krista Doyle, female    DOB: 1960-08-07, 56 y.o.   MRN: [id number]  HPI: Krista Doyle is a 56year old female who returns for follow up appointmentfor chronic pain and medication refill. She states her pain is located in her lower back radiating into her right lower extremity anteriorly . She rates her pain 8. Her current exercise regime is performing stretching exercises, ball therapy and walking.  Last UDS was performed on 03/28/2016, it was consistent.   Pain Inventory Average Pain 7 Pain Right Now 8 My pain is n/a  In the last 24 hours, has pain interfered with the following? General activity 9 Relation with others 8 Enjoyment of life 9 What TIME of day is your pain at its worst? night Sleep (in general) Fair  Pain is worse with: walking, bending, sitting and standing Pain improves with: rest, heat/ice, therapy/exercise, medication and TENS Relief from Meds: 7  Mobility walk with assistance use a cane use a walker how many minutes can you walk? 15-20 do you drive?  yes  Function disabled: date disabled .  Neuro/Psych weakness numbness tingling spasms  Prior Studies Any changes since last visit?  no  Physicians involved in your care Any changes since last visit?  no   Family History  Problem Relation Age of Onset  . Colon cancer Neg Hx    Social History   Social History  . Marital status: Married    Spouse name: N/A  . Number of children: N/A  . Years of education: N/A   Occupational History  . Disabled    Social History Main Topics  . Smoking status: Never Smoker  . Smokeless tobacco: Never Used  . Alcohol use No  . Drug use: No  . Sexual activity: Yes   Other Topics Concern  . None   Social History Narrative  . None   Past Surgical History:  Procedure Laterality Date  . BACK SURGERY    . BREAST LUMPECTOMY    . COLONOSCOPY     about 3 yrs ago in [place]  . KNEE SURGERY     right  . TONSILLECTOMY     Past Medical History:  Diagnosis Date  . Asthma   . Back pain   . Breast cancer ([place]) 2008   left  . Diabetes ([place])   . Hypercholesterolemia   . Neuropathy    extremities after chemo   BP 125/86   Pulse 91   SpO2 92%   Opioid Risk Score:   Fall Risk Score:  `1  Depression screen PHQ 2/9  Depression screen [place] 2/9 05/27/2016 03/28/2016 10/07/2015 08/31/2015 07/06/2015 04/28/2015 02/11/2015  Decreased Interest 0 0 0 1 0 1 1  Down, Depressed, Hopeless 0 0 0 0 0 0 0  PHQ - 2 Score 0 0 0 1 0 1 1  Altered sleeping - - - - - - 3  Tired, decreased energy - - - - - - 0  Change in appetite - - - - - - 0  Feeling bad or failure about yourself  - - - - - - 0  Trouble concentrating - - - - - - 0  Moving slowly or fidgety/restless - - - - - - 0  Suicidal thoughts - - - - - - 0  PHQ-9 Score - - - - - - 4  Difficult doing work/chores - - - - - - Somewhat difficult    Review  of Systems  HENT: Negative.   Eyes: Negative.   Respiratory: Negative.   Cardiovascular: Negative.   Gastrointestinal: Negative.   Endocrine: Negative.   Genitourinary: Negative.   Musculoskeletal: Negative.   Skin: Negative.   Allergic/Immunologic: Negative.   Neurological: Positive for weakness and numbness.  Hematological: Negative.   Psychiatric/Behavioral: Negative.   All other systems reviewed and are negative.      Objective:   Physical Exam  Constitutional: She is oriented to person, place, and time. She appears well-developed and well-nourished.  HENT:  Head: Normocephalic and atraumatic.  Neck: Normal range of motion. Neck supple.  Cardiovascular: Normal rate and regular rhythm.   Pulmonary/Chest: Effort normal and breath sounds normal.  Musculoskeletal:  Normal Muscle Bulk and Muscle Testing Reveals: Upper Extremities: Right: Full ROM and Muscle Strength 4/5 Left: Decreased ROM 45 Degrees and Muscle Strength 3/5 Wearing Left Wrist Splint Lumbar  Hypersensitivity Lower Extremities: Full ROM and Muscle Strength 5/5 Right Lower Extremity Flexion Produces Pain into Extremity Arises from Table slowly using straight cane for support Narrow Based Gait   Neurological: She is alert and oriented to person, place, and time.  Skin: Skin is warm and dry.  Psychiatric: She has a normal mood and affect.  Nursing note and vitals reviewed.         Assessment & Plan:  1. Chronic low back pain, lumbar spondylosis, spondylolisthesis, with lumbar radiculopathy.05/27/2016 Refilled: Fentanyl 12 mcg one patch every three days #10 and Oxycodone 10/325 mg one tablet every 8 hours as needed for pain #90.  We will continue the opioid monitoring program, this consists of regular clinic visits, examinations, urine drug screen, pill counts as well as use of [place] Controlled Substance reporting System. 2. Chemotherapy induced peripheral neuropathy: 05/27/2016 Continue Lyrica at 150mg  TID   20 minutes of face to face patient care time was spent during this visit. All questions were encouraged and answered.   F/U in 1 month

## 2016-05-29 DIAGNOSIS — M542 Cervicalgia: Secondary | ICD-10-CM | POA: Diagnosis not present

## 2016-05-29 DIAGNOSIS — M545 Low back pain: Secondary | ICD-10-CM | POA: Diagnosis not present

## 2016-06-10 ENCOUNTER — Other Ambulatory Visit (HOSPITAL_COMMUNITY): Payer: BLUE CROSS/BLUE SHIELD

## 2016-06-10 ENCOUNTER — Ambulatory Visit (HOSPITAL_COMMUNITY): Payer: BLUE CROSS/BLUE SHIELD

## 2016-06-20 ENCOUNTER — Telehealth: Payer: Self-pay | Admitting: Registered Nurse

## 2016-06-20 NOTE — Telephone Encounter (Signed)
On 06/17/2016 the  [place] was reviewed no conflict was seen on the [place] with multiple prescribers. Krista Doyle has a signed narcotic contract with our office. If there were any discrepancies this would have been reported to her physician.

## 2016-06-24 ENCOUNTER — Encounter: Payer: BLUE CROSS/BLUE SHIELD | Attending: Physical Medicine & Rehabilitation | Admitting: Registered Nurse

## 2016-06-24 ENCOUNTER — Encounter: Payer: Self-pay | Admitting: Registered Nurse

## 2016-06-24 VITALS — BP 114/79 | HR 108

## 2016-06-24 DIAGNOSIS — M4696 Unspecified inflammatory spondylopathy, lumbar region: Secondary | ICD-10-CM

## 2016-06-24 DIAGNOSIS — M47816 Spondylosis without myelopathy or radiculopathy, lumbar region: Secondary | ICD-10-CM | POA: Diagnosis not present

## 2016-06-24 DIAGNOSIS — T451X5A Adverse effect of antineoplastic and immunosuppressive drugs, initial encounter: Secondary | ICD-10-CM | POA: Diagnosis not present

## 2016-06-24 DIAGNOSIS — M545 Low back pain: Secondary | ICD-10-CM | POA: Diagnosis not present

## 2016-06-24 DIAGNOSIS — M5416 Radiculopathy, lumbar region: Secondary | ICD-10-CM | POA: Diagnosis not present

## 2016-06-24 DIAGNOSIS — Z79899 Other long term (current) drug therapy: Secondary | ICD-10-CM

## 2016-06-24 DIAGNOSIS — G620 Drug-induced polyneuropathy: Secondary | ICD-10-CM

## 2016-06-24 DIAGNOSIS — G8929 Other chronic pain: Secondary | ICD-10-CM | POA: Diagnosis not present

## 2016-06-24 DIAGNOSIS — G894 Chronic pain syndrome: Secondary | ICD-10-CM | POA: Diagnosis not present

## 2016-06-24 DIAGNOSIS — I890 Lymphedema, not elsewhere classified: Secondary | ICD-10-CM | POA: Diagnosis not present

## 2016-06-24 DIAGNOSIS — Z5181 Encounter for therapeutic drug level monitoring: Secondary | ICD-10-CM

## 2016-06-24 DIAGNOSIS — Z853 Personal history of malignant neoplasm of breast: Secondary | ICD-10-CM | POA: Insufficient documentation

## 2016-06-24 DIAGNOSIS — Z9221 Personal history of antineoplastic chemotherapy: Secondary | ICD-10-CM | POA: Diagnosis not present

## 2016-06-24 MED ORDER — OXYCODONE-ACETAMINOPHEN 10-325 MG PO TABS: 1.0000 | ORAL_TABLET | Freq: Three times a day (TID) | ORAL | 0 refills | Status: DC | PRN

## 2016-06-24 MED ORDER — FENTANYL 12 MCG/HR TD PT72: 12.5000 ug | MEDICATED_PATCH | TRANSDERMAL | 0 refills | Status: DC

## 2016-06-24 NOTE — Progress Notes (Signed)
Subjective:    Patient ID: Krista Doyle, female    DOB: 07-29-1960, 56 y.o.   MRN: [id number]  HPI: [name] is a 56year old female who returns for follow up appointmentfor chronic pain and medication refill. She states her pain is located in her lower back radiating into her right lower extremity anteriorly. She rates her pain 7. Her current exercise regime is performing stretching exercises, ball therapy and walking.  Last UDS was performed on 03/28/2016, it was consistent.  Pain Inventory Average Pain 7 Pain Right Now 7 My pain is sharp, stabbing and tingling  In the last 24 hours, has pain interfered with the following? General activity 8 Relation with others 7 Enjoyment of life 8 What TIME of day is your pain at its worst? evening and night Sleep (in general) NA  Pain is worse with: walking, bending, sitting and standing Pain improves with: rest, heat/ice, therapy/exercise, medication and TENS Relief from Meds: 6  Mobility use a cane use a walker ability to climb steps?  yes do you drive?  yes Do you have any goals in this area?  yes  Function disabled: date disabled .  Neuro/Psych numbness tingling spasms  Prior Studies Any changes since last visit?  no  Physicians involved in your care Any changes since last visit?  no   Family History  Problem Relation Age of Onset  . Colon cancer Neg Hx    Social History   Social History  . Marital status: Married    Spouse name: N/A  . Number of children: N/A  . Years of education: N/A   Occupational History  . Disabled    Social History Main Topics  . Smoking status: Never Smoker  . Smokeless tobacco: Never Used  . Alcohol use No  . Drug use: No  . Sexual activity: Yes   Other Topics Concern  . None   Social History Narrative  . None   Past Surgical History:  Procedure Laterality Date  . BACK SURGERY    . BREAST LUMPECTOMY    . COLONOSCOPY     about 3 yrs ago  in [place]  . KNEE SURGERY     right  . TONSILLECTOMY     Past Medical History:  Diagnosis Date  . Asthma   . Back pain   . Breast cancer ([place]) 2008   left  . Diabetes ([place])   . Hypercholesterolemia   . Neuropathy    extremities after chemo   BP 114/79   Pulse (!) 108   SpO2 98%   Opioid Risk Score:   Fall Risk Score:  `1  Depression screen PHQ 2/9  Depression screen [facility] 2/9 05/27/2016 03/28/2016 10/07/2015 08/31/2015 07/06/2015 04/28/2015 02/11/2015  Decreased Interest 0 0 0 1 0 1 1  Down, Depressed, Hopeless 0 0 0 0 0 0 0  PHQ - 2 Score 0 0 0 1 0 1 1  Altered sleeping - - - - - - 3  Tired, decreased energy - - - - - - 0  Change in appetite - - - - - - 0  Feeling bad or failure about yourself  - - - - - - 0  Trouble concentrating - - - - - - 0  Moving slowly or fidgety/restless - - - - - - 0  Suicidal thoughts - - - - - - 0  PHQ-9 Score - - - - - - 4  Difficult doing work/chores - - - - - -  Somewhat difficult    Review of Systems  Constitutional: Negative.   HENT: Negative.   Eyes: Negative.   Respiratory: Negative.   Cardiovascular: Positive for leg swelling.  Gastrointestinal: Negative.   Endocrine: Negative.   Genitourinary: Negative.   Musculoskeletal: Negative.   Skin: Negative.   Allergic/Immunologic: Negative.   Neurological: Positive for numbness.  Hematological: Negative.   Psychiatric/Behavioral: Negative.        Objective:   Physical Exam  Constitutional: She is oriented to person, place, and time. She appears well-developed and well-nourished.  HENT:  Head: Normocephalic and atraumatic.  Neck: Normal range of motion. Neck supple.  Cardiovascular: Normal rate and regular rhythm.   Pulmonary/Chest: Effort normal and breath sounds normal.  Musculoskeletal:  Normal Muscle Bulk and Muscle Testing Reveals: Upper Extremities: Right: Full ROM and Muscle Strength 5/5 Left: Decreased ROM 90 Degrees and Muscle Strength 4/5 Lumbar Paraspinal  Tenderness: L-3-L-5 Lower Extremities: Full ROM and Muscle Strength 5/5 Right Lower Extremity Flexion Produces Pain into Extremity Arises from Table slowly using straight cane for support Narrow Based Gait  Neurological: She is alert and oriented to person, place, and time.  Skin: Skin is warm and dry.  Psychiatric: She has a normal mood and affect.  Nursing note and vitals reviewed.         Assessment & Plan:  1. Chronic low back pain, lumbar spondylosis, spondylolisthesis, with lumbar radiculopathy.06/24/2016 Refilled: Fentanyl 12 mcg one patch every three days #10 and Oxycodone 10/325 mg one tablet every 8 hours as needed for pain #90.  We will continue the opioid monitoring program, this consists of regular clinic visits, examinations, urine drug screen, pill counts as well as use of [place] Controlled Substance reporting System. 2. Chemotherapy induced peripheral neuropathy: 06/24/2016 Continue Lyrica at 150mg  TID   1. Chronic low back pain, lumbar spondylosis, spondylolisthesis, with lumbar radiculopathy.05/27/2016 Refilled: Fentanyl 12 mcg one patch every three days #10 and Oxycodone 10/325 mg one tablet every 8 hours as needed for pain #90.  We will continue the opioid monitoring program, this consists of regular clinic visits, examinations, urine drug screen, pill counts as well as use of [place] Controlled Substance reporting System. 2. Chemotherapy induced peripheral neuropathy: 05/27/2016 Continue Lyrica at 150mg  TID   20 minutes of face to face patient care time was spent during this visit. All questions were encouraged and answered.   F/U in 1 month

## 2016-06-30 LAB — TOXASSURE SELECT,+ANTIDEPR,UR

## 2016-07-04 ENCOUNTER — Other Ambulatory Visit (HOSPITAL_COMMUNITY): Payer: Self-pay | Admitting: *Deleted

## 2016-07-04 ENCOUNTER — Telehealth: Payer: Self-pay | Admitting: *Deleted

## 2016-07-04 DIAGNOSIS — C50412 Malignant neoplasm of upper-outer quadrant of left female breast: Secondary | ICD-10-CM

## 2016-07-04 NOTE — Telephone Encounter (Signed)
Urine drug screen for this encounter is consistent for prescribed medication. Cyclobenzaprine is not a controlled substance and we are not the prescriber.

## 2016-07-05 ENCOUNTER — Encounter (HOSPITAL_COMMUNITY): Payer: BLUE CROSS/BLUE SHIELD

## 2016-07-05 ENCOUNTER — Ambulatory Visit (HOSPITAL_COMMUNITY): Payer: BLUE CROSS/BLUE SHIELD

## 2016-07-05 ENCOUNTER — Encounter (HOSPITAL_COMMUNITY): Payer: BLUE CROSS/BLUE SHIELD | Attending: Oncology

## 2016-07-05 DIAGNOSIS — C50412 Malignant neoplasm of upper-outer quadrant of left female breast: Secondary | ICD-10-CM | POA: Diagnosis not present

## 2016-07-05 LAB — CBC WITH DIFFERENTIAL/PLATELET
Basophils Absolute: 0 10*3/uL (ref 0.0–0.1)
Basophils Relative: 0 %
Eosinophils Absolute: 0.1 10*3/uL (ref 0.0–0.7)
Eosinophils Relative: 1 %
HCT: 39.2 % (ref 36.0–46.0)
Hemoglobin: 12.7 g/dL (ref 12.0–15.0)
Lymphocytes Relative: 34 %
Lymphs Abs: 2.7 10*3/uL (ref 0.7–4.0)
MCH: 26.7 pg (ref 26.0–34.0)
MCHC: 32.4 g/dL (ref 30.0–36.0)
MCV: 82.5 fL (ref 78.0–100.0)
Monocytes Absolute: 0.5 10*3/uL (ref 0.1–1.0)
Monocytes Relative: 6 %
Neutro Abs: 4.7 10*3/uL (ref 1.7–7.7)
Neutrophils Relative %: 59 %
Platelets: 246 10*3/uL (ref 150–400)
RBC: 4.75 MIL/uL (ref 3.87–5.11)
RDW: 13.4 % (ref 11.5–15.5)
WBC: 8.1 10*3/uL (ref 4.0–10.5)

## 2016-07-05 LAB — COMPREHENSIVE METABOLIC PANEL
ALT: 14 U/L (ref 14–54)
AST: 18 U/L (ref 15–41)
Albumin: 3.8 g/dL (ref 3.5–5.0)
Alkaline Phosphatase: 46 U/L (ref 38–126)
Anion gap: 8 (ref 5–15)
BUN: 12 mg/dL (ref 6–20)
CO2: 29 mmol/L (ref 22–32)
Calcium: 9.4 mg/dL (ref 8.9–10.3)
Chloride: 101 mmol/L (ref 101–111)
Creatinine, Ser: 0.68 mg/dL (ref 0.44–1.00)
GFR calc Af Amer: >60 mL/min (ref >60)
GFR calc non Af Amer: >60 mL/min (ref >60)
Glucose, Bld: 151 mg/dL — ABNORMAL HIGH (ref 65–99)
Potassium: 4.1 mmol/L (ref 3.5–5.1)
Sodium: 138 mmol/L (ref 135–145)
Total Bilirubin: 0.5 mg/dL (ref 0.3–1.2)
Total Protein: 7.8 g/dL (ref 6.5–8.1)

## 2016-07-08 ENCOUNTER — Ambulatory Visit (HOSPITAL_COMMUNITY): Payer: BLUE CROSS/BLUE SHIELD | Admitting: Hematology

## 2016-07-12 DIAGNOSIS — E119 Type 2 diabetes mellitus without complications: Secondary | ICD-10-CM | POA: Diagnosis not present

## 2016-07-12 DIAGNOSIS — I10 Essential (primary) hypertension: Secondary | ICD-10-CM | POA: Diagnosis not present

## 2016-07-12 DIAGNOSIS — G629 Polyneuropathy, unspecified: Secondary | ICD-10-CM | POA: Diagnosis not present

## 2016-07-12 DIAGNOSIS — J4520 Mild intermittent asthma, uncomplicated: Secondary | ICD-10-CM | POA: Diagnosis not present

## 2016-07-25 ENCOUNTER — Encounter: Payer: BLUE CROSS/BLUE SHIELD | Admitting: Registered Nurse

## 2016-07-26 ENCOUNTER — Ambulatory Visit: Payer: BLUE CROSS/BLUE SHIELD | Admitting: Registered Nurse

## 2016-07-26 ENCOUNTER — Encounter: Payer: BLUE CROSS/BLUE SHIELD | Attending: Physical Medicine & Rehabilitation | Admitting: Registered Nurse

## 2016-07-26 ENCOUNTER — Encounter: Payer: Self-pay | Admitting: Registered Nurse

## 2016-07-26 VITALS — BP 123/90 | HR 102

## 2016-07-26 DIAGNOSIS — M5416 Radiculopathy, lumbar region: Secondary | ICD-10-CM

## 2016-07-26 DIAGNOSIS — M545 Low back pain: Secondary | ICD-10-CM | POA: Diagnosis not present

## 2016-07-26 DIAGNOSIS — G620 Drug-induced polyneuropathy: Secondary | ICD-10-CM | POA: Insufficient documentation

## 2016-07-26 DIAGNOSIS — T451X5A Adverse effect of antineoplastic and immunosuppressive drugs, initial encounter: Secondary | ICD-10-CM

## 2016-07-26 DIAGNOSIS — Z853 Personal history of malignant neoplasm of breast: Secondary | ICD-10-CM | POA: Diagnosis not present

## 2016-07-26 DIAGNOSIS — Z5181 Encounter for therapeutic drug level monitoring: Secondary | ICD-10-CM | POA: Diagnosis not present

## 2016-07-26 DIAGNOSIS — M47816 Spondylosis without myelopathy or radiculopathy, lumbar region: Secondary | ICD-10-CM | POA: Diagnosis not present

## 2016-07-26 DIAGNOSIS — G894 Chronic pain syndrome: Secondary | ICD-10-CM

## 2016-07-26 DIAGNOSIS — G8929 Other chronic pain: Secondary | ICD-10-CM | POA: Diagnosis not present

## 2016-07-26 DIAGNOSIS — I890 Lymphedema, not elsewhere classified: Secondary | ICD-10-CM | POA: Diagnosis not present

## 2016-07-26 DIAGNOSIS — Z79899 Other long term (current) drug therapy: Secondary | ICD-10-CM

## 2016-07-26 DIAGNOSIS — Z9221 Personal history of antineoplastic chemotherapy: Secondary | ICD-10-CM | POA: Diagnosis not present

## 2016-07-26 MED ORDER — FENTANYL 12 MCG/HR TD PT72: 12.5000 ug | MEDICATED_PATCH | TRANSDERMAL | 0 refills | Status: DC

## 2016-07-26 MED ORDER — OXYCODONE-ACETAMINOPHEN 10-325 MG PO TABS: 1.0000 | ORAL_TABLET | Freq: Three times a day (TID) | ORAL | 0 refills | Status: DC | PRN

## 2016-07-26 NOTE — Progress Notes (Signed)
Subjective:    Patient ID: Krista Doyle, female    DOB: 11/09/1960, 56 y.o.   MRN: [id number]  HPI: Krista Doyle is a 56year old female who returns for follow up appointmentfor chronic pain and medication refill. She states her pain is located in her lower back radiating into her right lower extremity anteriorly. She rates her pain 6. Her current exercise regime is performing stretching exercises, and walking.  Last UDS was performed on 06/24/2016, it was consistent.   Pain Inventory Average Pain 7 Pain Right Now 6 My pain is sharp, stabbing and aching  In the last 24 hours, has pain interfered with the following? General activity 7 Relation with others 8 Enjoyment of life 8 What TIME of day is your pain at its worst? evening Sleep (in general) Fair  Pain is worse with: walking, bending, sitting, standing and some activites Pain improves with: rest, heat/ice and therapy/exercise Relief from Meds: 6  Mobility use a cane use a walker ability to climb steps?  yes do you drive?  yes  Function disabled: date disabled . I need assistance with the following:  household duties  Neuro/Psych tingling  Prior Studies Any changes since last visit?  no  Physicians involved in your care Any changes since last visit?  no   Family History  Problem Relation Age of Onset  . Colon cancer Neg Hx    Social History   Social History  . Marital status: Married    Spouse name: N/A  . Number of children: N/A  . Years of education: N/A   Occupational History  . Disabled    Social History Main Topics  . Smoking status: Never Smoker  . Smokeless tobacco: Never Used  . Alcohol use No  . Drug use: No  . Sexual activity: Yes   Other Topics Concern  . Not on file   Social History Narrative  . No narrative on file   Past Surgical History:  Procedure Laterality Date  . BACK SURGERY    . BREAST LUMPECTOMY    . COLONOSCOPY     about 3 yrs ago in  [place]  . KNEE SURGERY     right  . TONSILLECTOMY     Past Medical History:  Diagnosis Date  . Asthma   . Back pain   . Breast cancer ([place]) 2008   left  . Diabetes ([place])   . Hypercholesterolemia   . Neuropathy    extremities after chemo   There were no vitals taken for this visit.  Opioid Risk Score:   Fall Risk Score:  `1  Depression screen PHQ 2/9  Depression screen [facility] 2/9 05/27/2016 03/28/2016 10/07/2015 08/31/2015 07/06/2015 04/28/2015 02/11/2015  Decreased Interest 0 0 0 1 0 1 1  Down, Depressed, Hopeless 0 0 0 0 0 0 0  PHQ - 2 Score 0 0 0 1 0 1 1  Altered sleeping - - - - - - 3  Tired, decreased energy - - - - - - 0  Change in appetite - - - - - - 0  Feeling bad or failure about yourself  - - - - - - 0  Trouble concentrating - - - - - - 0  Moving slowly or fidgety/restless - - - - - - 0  Suicidal thoughts - - - - - - 0  PHQ-9 Score - - - - - - 4  Difficult doing work/chores - - - - - - Somewhat  difficult     Review of Systems  Constitutional: Negative.   HENT: Negative.   Eyes: Negative.   Respiratory: Negative.   Cardiovascular: Negative.   Gastrointestinal: Negative.   Endocrine: Negative.   Genitourinary: Negative.   Musculoskeletal: Positive for joint swelling.  Skin: Negative.   Allergic/Immunologic: Negative.   Neurological: Negative.   Hematological: Negative.   Psychiatric/Behavioral: Negative.   All other systems reviewed and are negative.      Objective:   Physical Exam  Constitutional: She is oriented to person, place, and time. She appears well-developed and well-nourished.  HENT:  Head: Normocephalic and atraumatic.  Neck: Normal range of motion. Neck supple.  Cardiovascular: Normal rate and regular rhythm.   Pulmonary/Chest: Effort normal and breath sounds normal.  Musculoskeletal:  Normal Muscle Bulk and Muscle Testing Reveals: Upper Extremities: Right: Full ROM and Muscle Strength 5/5 Left: Decreased ROM 90 Degrees and Muscle  Strength 4/5 Lumbar Hypersensitivity Lower Extremities: Full ROM and Muscle Strength 5/5 Arises from Table slowly, using straight cane  Antalgic Gait    Neurological: She is alert and oriented to person, place, and time.  Skin: Skin is warm and dry.  Psychiatric: She has a normal mood and affect.  Nursing note and vitals reviewed.         Assessment & Plan:  1. Chronic low back pain, lumbar spondylosis, spondylolisthesis, with lumbar radiculopathy.07/26/2016 Refilled: Fentanyl 12 mcg one patch every three days #10 and Oxycodone 10/325 mg one tablet every 8 hours as needed for pain #90.  We will continue the opioid monitoring program, this consists of regular clinic visits, examinations, urine drug screen, pill counts as well as use of [place] Controlled Substance reporting System. 2. Chemotherapy induced peripheral neuropathy: 07/26/2016 Continue Lyrica at 150mg  TID   20 minutes of face to face patient care time was spent during this visit. All questions were encouraged and answered.   F/U in 1 month

## 2016-08-18 ENCOUNTER — Ambulatory Visit: Payer: BLUE CROSS/BLUE SHIELD | Admitting: Registered Nurse

## 2016-08-24 ENCOUNTER — Encounter: Payer: BLUE CROSS/BLUE SHIELD | Attending: Physical Medicine & Rehabilitation | Admitting: Registered Nurse

## 2016-08-24 ENCOUNTER — Encounter: Payer: Self-pay | Admitting: Registered Nurse

## 2016-08-24 ENCOUNTER — Telehealth (HOSPITAL_COMMUNITY): Payer: Self-pay | Admitting: Oncology

## 2016-08-24 VITALS — BP 137/88 | HR 98 | Temp 98.4°F

## 2016-08-24 DIAGNOSIS — Z9221 Personal history of antineoplastic chemotherapy: Secondary | ICD-10-CM | POA: Insufficient documentation

## 2016-08-24 DIAGNOSIS — I890 Lymphedema, not elsewhere classified: Secondary | ICD-10-CM | POA: Insufficient documentation

## 2016-08-24 DIAGNOSIS — Z79899 Other long term (current) drug therapy: Secondary | ICD-10-CM

## 2016-08-24 DIAGNOSIS — T451X5A Adverse effect of antineoplastic and immunosuppressive drugs, initial encounter: Secondary | ICD-10-CM

## 2016-08-24 DIAGNOSIS — M47816 Spondylosis without myelopathy or radiculopathy, lumbar region: Secondary | ICD-10-CM | POA: Insufficient documentation

## 2016-08-24 DIAGNOSIS — Z5181 Encounter for therapeutic drug level monitoring: Secondary | ICD-10-CM

## 2016-08-24 DIAGNOSIS — G8929 Other chronic pain: Secondary | ICD-10-CM | POA: Insufficient documentation

## 2016-08-24 DIAGNOSIS — M545 Low back pain: Secondary | ICD-10-CM | POA: Diagnosis not present

## 2016-08-24 DIAGNOSIS — Z853 Personal history of malignant neoplasm of breast: Secondary | ICD-10-CM | POA: Insufficient documentation

## 2016-08-24 DIAGNOSIS — M5416 Radiculopathy, lumbar region: Secondary | ICD-10-CM

## 2016-08-24 DIAGNOSIS — G894 Chronic pain syndrome: Secondary | ICD-10-CM | POA: Diagnosis not present

## 2016-08-24 DIAGNOSIS — G620 Drug-induced polyneuropathy: Secondary | ICD-10-CM | POA: Insufficient documentation

## 2016-08-24 MED ORDER — FENTANYL 12 MCG/HR TD PT72: 12.5000 ug | MEDICATED_PATCH | TRANSDERMAL | 0 refills | Status: DC

## 2016-08-24 MED ORDER — OXYCODONE-ACETAMINOPHEN 10-325 MG PO TABS
1.0000 | ORAL_TABLET | Freq: Three times a day (TID) | ORAL | 0 refills | Status: DC | PRN
Start: 2016-08-24 — End: 2016-09-21

## 2016-08-24 MED ORDER — PREGABALIN 150 MG PO CAPS: 150.0000 mg | ORAL_CAPSULE | Freq: Two times a day (BID) | ORAL | 4 refills | Status: DC

## 2016-08-24 NOTE — Progress Notes (Signed)
Subjective:    Patient ID: Krista Doyle, female    DOB: 1960-10-17, 56 y.o.   MRN: [id number]  HPI: Krista Doyle is a 56year old female who returns for follow up appointmentfor chronic pain and medication refill. She states her pain is located in her lower back radiating into her right lower extremity. She rates her pain 6. Her current exercise regime is performing stretching exercises, and walking.  Last UDS was performed on 07/04/2016, it was consistent.   Pain Inventory Average Pain 7 Pain Right Now 6 My pain is sharp and stabbing  In the last 24 hours, has pain interfered with the following? General activity 8 Relation with others 7 Enjoyment of life 5 What TIME of day is your pain at its worst? evening, night Sleep (in general) Poor  Pain is worse with: walking, bending, sitting and standing Pain improves with: . Relief from Meds: 5  Mobility walk with assistance use a cane use a walker ability to climb steps?  yes do you drive?  yes Do you have any goals in this area?  yes  Function disabled: date disabled .  Neuro/Psych weakness numbness tingling spasms  Prior Studies .  Physicians involved in your care .   Family History  Problem Relation Age of Onset  . Colon cancer Neg Hx    Social History   Social History  . Marital status: Married    Spouse name: N/A  . Number of children: N/A  . Years of education: N/A   Occupational History  . Disabled    Social History Main Topics  . Smoking status: Never Smoker  . Smokeless tobacco: Never Used  . Alcohol use No  . Drug use: No  . Sexual activity: Yes   Other Topics Concern  . None   Social History Narrative  . None   Past Surgical History:  Procedure Laterality Date  . BACK SURGERY    . BREAST LUMPECTOMY    . COLONOSCOPY     about 3 yrs ago in [place]  . KNEE SURGERY     right  . TONSILLECTOMY     Past Medical History:  Diagnosis Date  . Asthma     . Back pain   . Breast cancer ([place]) 2008   left  . Diabetes ([place])   . Hypercholesterolemia   . Neuropathy    extremities after chemo   BP (!) 158/100   Pulse 98   Temp 98.4 F (36.9 C)   SpO2 98%   Opioid Risk Score:   Fall Risk Score:  `1  Depression screen PHQ 2/9  Depression screen [facility] 2/9 05/27/2016 03/28/2016 10/07/2015 08/31/2015 07/06/2015 04/28/2015 02/11/2015  Decreased Interest 0 0 0 1 0 1 1  Down, Depressed, Hopeless 0 0 0 0 0 0 0  PHQ - 2 Score 0 0 0 1 0 1 1  Altered sleeping - - - - - - 3  Tired, decreased energy - - - - - - 0  Change in appetite - - - - - - 0  Feeling bad or failure about yourself  - - - - - - 0  Trouble concentrating - - - - - - 0  Moving slowly or fidgety/restless - - - - - - 0  Suicidal thoughts - - - - - - 0  PHQ-9 Score - - - - - - 4  Difficult doing work/chores - - - - - - Somewhat difficult  Review of Systems  Constitutional: Negative.   HENT: Negative.   Eyes: Negative.   Respiratory: Negative.   Cardiovascular:       Limb swelling  Gastrointestinal: Negative.   Endocrine: Negative.   Genitourinary: Negative.   Musculoskeletal: Negative.   Skin: Negative.   Allergic/Immunologic: Negative.   Neurological: Negative.   Hematological: Negative.   Psychiatric/Behavioral: Negative.   All other systems reviewed and are negative.      Objective:   Physical Exam  Constitutional: She is oriented to person, place, and time. She appears well-developed and well-nourished.  HENT:  Head: Normocephalic and atraumatic.  Neck: Normal range of motion. Neck supple.  Cardiovascular: Normal rate and regular rhythm.   Pulmonary/Chest: Effort normal and breath sounds normal.  Musculoskeletal:  Normal Muscle Bulk and Muscle Testing Reveals: Upper Extremities: Right: Full ROM and Muscle Strength 5/5 Left: Decreased ROM and Muscle Strength 3/5 Lumbar Paraspinal Tenderness: L-3-L-5 Lower Extremities: Full ROM and Muscle Strength 5/5 Arises  from Table Slowly using Straight Cane for Support Narrow Based Gait  Neurological: She is alert and oriented to person, place, and time.  Skin: Skin is warm and dry.  Psychiatric: She has a normal mood and affect.  Nursing note and vitals reviewed.         Assessment & Plan:  1. Chronic low back pain, lumbar spondylosis, spondylolisthesis, with lumbar radiculopathy.08/24/2016 Refilled: Fentanyl 12 mcg one patch every three days #10 and Oxycodone 10/325 mg one tablet every 8 hours as needed for pain #90.  We will continue the opioid monitoring program, this consists of regular clinic visits, examinations, urine drug screen, pill counts as well as use of [place] Controlled Substance reporting System. 2. Chemotherapy induced peripheral neuropathy: 08/24/2016 Continue Lyrica at 150mg  TID   20 minutes of face to face patient care time was spent during this visit. All questions were encouraged and answered.    F/U in 1 month

## 2016-08-24 NOTE — Telephone Encounter (Signed)
FAXED PRE D TO [facility]

## 2016-08-26 DIAGNOSIS — L819 Disorder of pigmentation, unspecified: Secondary | ICD-10-CM | POA: Diagnosis not present

## 2016-08-26 DIAGNOSIS — I10 Essential (primary) hypertension: Secondary | ICD-10-CM | POA: Diagnosis not present

## 2016-09-12 ENCOUNTER — Other Ambulatory Visit (HOSPITAL_COMMUNITY): Payer: Self-pay | Admitting: *Deleted

## 2016-09-12 DIAGNOSIS — C50412 Malignant neoplasm of upper-outer quadrant of left female breast: Secondary | ICD-10-CM

## 2016-09-13 ENCOUNTER — Encounter (HOSPITAL_COMMUNITY): Payer: Self-pay

## 2016-09-13 ENCOUNTER — Encounter (HOSPITAL_COMMUNITY): Payer: BLUE CROSS/BLUE SHIELD

## 2016-09-13 ENCOUNTER — Encounter (HOSPITAL_COMMUNITY): Payer: BLUE CROSS/BLUE SHIELD | Attending: Adult Health

## 2016-09-13 VITALS — BP 123/89 | HR 74 | Temp 98.5°F | Resp 18

## 2016-09-13 DIAGNOSIS — L80 Vitiligo: Secondary | ICD-10-CM | POA: Diagnosis not present

## 2016-09-13 DIAGNOSIS — Z79811 Long term (current) use of aromatase inhibitors: Secondary | ICD-10-CM

## 2016-09-13 DIAGNOSIS — M8580 Other specified disorders of bone density and structure, unspecified site: Secondary | ICD-10-CM

## 2016-09-13 DIAGNOSIS — C50412 Malignant neoplasm of upper-outer quadrant of left female breast: Secondary | ICD-10-CM

## 2016-09-13 DIAGNOSIS — Z79899 Other long term (current) drug therapy: Secondary | ICD-10-CM

## 2016-09-13 DIAGNOSIS — Z170 Estrogen receptor positive status [ER+]: Secondary | ICD-10-CM | POA: Diagnosis not present

## 2016-09-13 LAB — CBC WITH DIFFERENTIAL/PLATELET
Basophils Absolute: 0 10*3/uL (ref 0.0–0.1)
Basophils Relative: 0 %
Eosinophils Absolute: 0.2 10*3/uL (ref 0.0–0.7)
Eosinophils Relative: 3 %
HCT: 38.8 % (ref 36.0–46.0)
Hemoglobin: 12.3 g/dL (ref 12.0–15.0)
Lymphocytes Relative: 38 %
Lymphs Abs: 3.2 10*3/uL (ref 0.7–4.0)
MCH: 26.1 pg (ref 26.0–34.0)
MCHC: 31.7 g/dL (ref 30.0–36.0)
MCV: 82.4 fL (ref 78.0–100.0)
Monocytes Absolute: 0.7 10*3/uL (ref 0.1–1.0)
Monocytes Relative: 9 %
Neutro Abs: 4.2 10*3/uL (ref 1.7–7.7)
Neutrophils Relative %: 50 %
Platelets: 268 10*3/uL (ref 150–400)
RBC: 4.71 MIL/uL (ref 3.87–5.11)
RDW: 13.5 % (ref 11.5–15.5)
WBC: 8.4 10*3/uL (ref 4.0–10.5)

## 2016-09-13 LAB — COMPREHENSIVE METABOLIC PANEL
ALT: 13 U/L — ABNORMAL LOW (ref 14–54)
AST: 20 U/L (ref 15–41)
Albumin: 3.5 g/dL (ref 3.5–5.0)
Alkaline Phosphatase: 48 U/L (ref 38–126)
Anion gap: 8 (ref 5–15)
BUN: 15 mg/dL (ref 6–20)
CO2: 29 mmol/L (ref 22–32)
Calcium: 9.9 mg/dL (ref 8.9–10.3)
Chloride: 101 mmol/L (ref 101–111)
Creatinine, Ser: 0.78 mg/dL (ref 0.44–1.00)
GFR calc Af Amer: >60 mL/min (ref >60)
GFR calc non Af Amer: >60 mL/min (ref >60)
Glucose, Bld: 115 mg/dL — ABNORMAL HIGH (ref 65–99)
Potassium: 4.4 mmol/L (ref 3.5–5.1)
Sodium: 138 mmol/L (ref 135–145)
Total Bilirubin: 0.3 mg/dL (ref 0.3–1.2)
Total Protein: 7.6 g/dL (ref 6.5–8.1)

## 2016-09-13 MED ORDER — SODIUM CHLORIDE 0.9 % IV SOLN: Freq: Once | INTRAVENOUS | Status: DC

## 2016-09-13 MED ORDER — DENOSUMAB 60 MG/ML ~~LOC~~ SOLN
60.0000 mg | Freq: Once | SUBCUTANEOUS | Status: AC
  Administered 2016-09-13: 60 mg via SUBCUTANEOUS
  Filled 2016-09-13: qty 1

## 2016-09-13 NOTE — Patient Instructions (Signed)
[facility] Discharge Instructions  RECOMMENDATIONS MADE BY THE CONSULTANT AND ANY TEST RESULTS WILL BE SENT TO YOUR REFERRING PHYSICIAN.  Prolia given today. Follow up as scheduled.  Thank you for choosing [facility] at [facility] to provide your oncology and hematology care.  To afford each patient quality time with our provider, please arrive at least 15 minutes before your scheduled appointment time.    If you have a lab appointment with the Cancer Center please come in thru the  Main Entrance and check in at the main information desk  You need to re-schedule your appointment should you arrive 10 or more minutes late.  We strive to give you quality time with our providers, and arriving late affects you and other patients whose appointments are after yours.  Also, if you no show three or more times for appointments you may be dismissed from the clinic at the providers discretion.     Again, thank you for choosing [facility].  Our hope is that these requests will decrease the amount of time that you wait before being seen by our physicians.       _____________________________________________________________  Should you have questions after your visit to [facility], please contact our office at [phone] between the hours of 8:30 a.m. and 4:30 p.m.  Voicemails left after 4:30 p.m. will not be returned until the following business day.  For prescription refill requests, have your pharmacy contact our office.       Resources For Cancer Patients and their Caregivers ? American Cancer Society: Can assist with transportation, wigs, general needs, runs Look Good Feel Better.        1-888-227-6333 ? Cancer Care: Provides financial assistance, online support groups, medication/co-pay assistance.  1-800-813-HOPE (4673) ? [facility] Assists [place] cancer patients and  their families through emotional , educational and financial support.  336-427-4357 ? [place] DSS Where to apply for food stamps, Medicaid and utility assistance. 336-342-1394 ? [facility]: Transportation to medical appointments. 336-347-2287 ? Social Security Administration: May apply for disability if have a Stage IV cancer. 336-342-7796 1-800-772-1213 ? [place] Aging, Disability and Transit Services: Assists with nutrition, care and transit needs. 336-349-2343  Cancer Center Support Programs: @10RELATIVEDAYS@ > Cancer Support Group  2nd Tuesday of the month 1pm-2pm, Journey Room  > Creative Journey  3rd Tuesday of the month 1130am-1pm, Journey Room  > Look Good Feel Better  1st Wednesday of the month 10am-12 noon, Journey Room (Call American Cancer Society to register 1-800-395-5775)   

## 2016-09-13 NOTE — Progress Notes (Signed)
Krista Doyle presents today for injection per MD orders. Prolia 60 mg  administered SQ in right Upper Arm. Administration without incident. Patient tolerated well. Vitals stable and discharged home from clinic ambulatory. Follow up as scheduled

## 2016-09-21 ENCOUNTER — Encounter: Payer: BLUE CROSS/BLUE SHIELD | Attending: Physical Medicine & Rehabilitation | Admitting: Registered Nurse

## 2016-09-21 ENCOUNTER — Encounter: Payer: Self-pay | Admitting: Registered Nurse

## 2016-09-21 VITALS — BP 127/86 | HR 102 | Resp 14

## 2016-09-21 DIAGNOSIS — Z5181 Encounter for therapeutic drug level monitoring: Secondary | ICD-10-CM

## 2016-09-21 DIAGNOSIS — G620 Drug-induced polyneuropathy: Secondary | ICD-10-CM | POA: Insufficient documentation

## 2016-09-21 DIAGNOSIS — M5416 Radiculopathy, lumbar region: Secondary | ICD-10-CM | POA: Diagnosis not present

## 2016-09-21 DIAGNOSIS — Z79899 Other long term (current) drug therapy: Secondary | ICD-10-CM | POA: Diagnosis not present

## 2016-09-21 DIAGNOSIS — T451X5A Adverse effect of antineoplastic and immunosuppressive drugs, initial encounter: Secondary | ICD-10-CM

## 2016-09-21 DIAGNOSIS — Z9221 Personal history of antineoplastic chemotherapy: Secondary | ICD-10-CM | POA: Diagnosis not present

## 2016-09-21 DIAGNOSIS — G8929 Other chronic pain: Secondary | ICD-10-CM | POA: Insufficient documentation

## 2016-09-21 DIAGNOSIS — M545 Low back pain: Secondary | ICD-10-CM | POA: Insufficient documentation

## 2016-09-21 DIAGNOSIS — Z853 Personal history of malignant neoplasm of breast: Secondary | ICD-10-CM | POA: Diagnosis not present

## 2016-09-21 DIAGNOSIS — G894 Chronic pain syndrome: Secondary | ICD-10-CM

## 2016-09-21 DIAGNOSIS — I890 Lymphedema, not elsewhere classified: Secondary | ICD-10-CM | POA: Insufficient documentation

## 2016-09-21 DIAGNOSIS — M47816 Spondylosis without myelopathy or radiculopathy, lumbar region: Secondary | ICD-10-CM | POA: Insufficient documentation

## 2016-09-21 DIAGNOSIS — M25561 Pain in right knee: Secondary | ICD-10-CM

## 2016-09-21 MED ORDER — OXYCODONE-ACETAMINOPHEN 10-325 MG PO TABS: 1.0000 | ORAL_TABLET | Freq: Three times a day (TID) | ORAL | 0 refills | Status: DC | PRN

## 2016-09-21 MED ORDER — FENTANYL 12 MCG/HR TD PT72: 12.5000 ug | MEDICATED_PATCH | TRANSDERMAL | 0 refills | Status: DC

## 2016-09-21 NOTE — Progress Notes (Signed)
Subjective:    Patient ID: Krista Doyle, female    DOB: 11-Jun-1960, 56 y.o.   MRN: [id number]  HPI: [name] is a 56year old female who returns for follow up appointmentfor chronic pain and medication refill. She states her pain is located in her lower back radiating into her right lower extremity also has right knee pain. She rates her pain 6. Her current exercise regime is performing stretching exercises, and walking.  Last UDS was performed on 07/04/2016, it was consistent.   Pain Inventory Average Pain 7 Pain Right Now 6 My pain is constant and stabbing  In the last 24 hours, has pain interfered with the following? General activity 7 Relation with others 7 Enjoyment of life 7 What TIME of day is your pain at its worst? evening, night Sleep (in general) Poor  Pain is worse with: walking, bending, sitting and standing Pain improves with: medication and TENS Relief from Meds: 7  Mobility walk with assistance use a cane use a walker ability to climb steps?  yes do you drive?  yes Do you have any goals in this area?  no  Function disabled: date disabled .  Neuro/Psych numbness tingling  Prior Studies Any changes since last visit?  no  Physicians involved in your care Any changes since last visit?  no   Family History  Problem Relation Age of Onset  . Colon cancer Neg Hx    Social History   Social History  . Marital status: Married    Spouse name: N/A  . Number of children: N/A  . Years of education: N/A   Occupational History  . Disabled    Social History Main Topics  . Smoking status: Never Smoker  . Smokeless tobacco: Never Used  . Alcohol use No  . Drug use: No  . Sexual activity: Yes   Other Topics Concern  . None   Social History Narrative  . None   Past Surgical History:  Procedure Laterality Date  . BACK SURGERY    . BREAST LUMPECTOMY    . COLONOSCOPY     about 3 yrs ago in [place]  . KNEE  SURGERY     right  . TONSILLECTOMY     Past Medical History:  Diagnosis Date  . Asthma   . Back pain   . Breast cancer ([place]) 2008   left  . Diabetes ([place])   . Hypercholesterolemia   . Neuropathy    extremities after chemo   BP 127/86 (BP Location: Right Arm, Patient Position: Sitting, Cuff Size: Normal)   Pulse (!) 102   Resp 14   SpO2 96%   Opioid Risk Score:   Fall Risk Score:  `1  Depression screen PHQ 2/9  Depression screen [facility] 2/9 05/27/2016 03/28/2016 10/07/2015 08/31/2015 07/06/2015 04/28/2015 02/11/2015  Decreased Interest 0 0 0 1 0 1 1  Down, Depressed, Hopeless 0 0 0 0 0 0 0  PHQ - 2 Score 0 0 0 1 0 1 1  Altered sleeping - - - - - - 3  Tired, decreased energy - - - - - - 0  Change in appetite - - - - - - 0  Feeling bad or failure about yourself  - - - - - - 0  Trouble concentrating - - - - - - 0  Moving slowly or fidgety/restless - - - - - - 0  Suicidal thoughts - - - - - - 0  PHQ-9  Score - - - - - - 4  Difficult doing work/chores - - - - - - Somewhat difficult    Review of Systems  Constitutional: Negative.   HENT: Negative.   Eyes: Negative.   Respiratory: Negative.   Cardiovascular: Negative.   Gastrointestinal: Negative.   Endocrine: Negative.   Genitourinary: Negative.   Musculoskeletal: Positive for arthralgias and back pain.  Skin: Negative.   Allergic/Immunologic: Negative.   Neurological: Positive for numbness and headaches.       Tingling  Hematological: Negative.   Psychiatric/Behavioral: Negative.        Objective:   Physical Exam  Constitutional: She is oriented to person, place, and time. She appears well-developed and well-nourished.  HENT:  Head: Normocephalic and atraumatic.  Neck: Normal range of motion. Neck supple.  Cardiovascular: Normal rate and regular rhythm.   Pulmonary/Chest: Effort normal and breath sounds normal.  Musculoskeletal:  Normal Muscle Bulk and muscle testing Reveals: Upper Extremities: Decreased ROM 90  Degrees and Muscle Strength 4/5 Thoracic Paraspinal Tenderness: T: 10- T-12 Lumbar Hypersensitivity Lower Extremities: Full ROM and Muscle Strength 5/5 Arises from Table Slowly using Straight Cane for Support Narrow Based gait  Neurological: She is alert and oriented to person, place, and time.  Skin: Skin is warm and dry.  Psychiatric: She has a normal mood and affect.  Nursing note and vitals reviewed.         Assessment & Plan:  1. Chronic low back pain, lumbar spondylosis, spondylolisthesis, with lumbar radiculopathy.09/21/2016 Refilled: Fentanyl 12 mcg one patch every three days #10 and Oxycodone 10/325 mg one tablet every 8 hours as needed for pain #90.  We will continue the opioid monitoring program, this consists of regular clinic visits, examinations, urine drug screen, pill counts as well as use of [place] Controlled Substance reporting System. 2. Chemotherapy induced peripheral neuropathy: 09/21/2016 Continue Lyrica at 150mg  TID   20 minutes of face to face patient care time was spent during this visit. All questions were encouraged and answered.   F/U in 1 month

## 2016-09-23 ENCOUNTER — Telehealth: Payer: Self-pay | Admitting: Registered Nurse

## 2016-09-23 NOTE — Telephone Encounter (Signed)
On 09/23/2016 the  [place] was reviewed no conflict was seen on the [place] with multiple prescribers.  Krista Doyle has a signed narcotic contract with our office. If there were any discrepancies this would have been reported to her physician.

## 2016-10-10 ENCOUNTER — Ambulatory Visit (HOSPITAL_COMMUNITY): Payer: BLUE CROSS/BLUE SHIELD

## 2016-10-19 ENCOUNTER — Encounter: Payer: BLUE CROSS/BLUE SHIELD | Admitting: Registered Nurse

## 2016-10-24 ENCOUNTER — Encounter (HOSPITAL_COMMUNITY): Payer: BLUE CROSS/BLUE SHIELD | Attending: Adult Health | Admitting: Oncology

## 2016-10-24 ENCOUNTER — Encounter (HOSPITAL_COMMUNITY): Payer: Self-pay | Admitting: Oncology

## 2016-10-24 VITALS — BP 118/72 | HR 99 | Resp 16 | Ht 64.0 in | Wt 150.0 lb

## 2016-10-24 DIAGNOSIS — C50412 Malignant neoplasm of upper-outer quadrant of left female breast: Secondary | ICD-10-CM | POA: Insufficient documentation

## 2016-10-24 DIAGNOSIS — Z853 Personal history of malignant neoplasm of breast: Secondary | ICD-10-CM

## 2016-10-24 DIAGNOSIS — M8580 Other specified disorders of bone density and structure, unspecified site: Secondary | ICD-10-CM | POA: Diagnosis not present

## 2016-10-24 DIAGNOSIS — Z79811 Long term (current) use of aromatase inhibitors: Secondary | ICD-10-CM | POA: Diagnosis not present

## 2016-10-24 DIAGNOSIS — E559 Vitamin D deficiency, unspecified: Secondary | ICD-10-CM

## 2016-10-24 DIAGNOSIS — Z170 Estrogen receptor positive status [ER+]: Secondary | ICD-10-CM | POA: Diagnosis not present
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
## 2016-10-26 ENCOUNTER — Encounter: Payer: BLUE CROSS/BLUE SHIELD | Attending: Physical Medicine & Rehabilitation | Admitting: Registered Nurse

## 2016-10-26 ENCOUNTER — Encounter: Payer: Self-pay | Admitting: Registered Nurse

## 2016-10-26 VITALS — BP 110/75 | HR 97 | Resp 14

## 2016-10-26 DIAGNOSIS — T451X5A Adverse effect of antineoplastic and immunosuppressive drugs, initial encounter: Secondary | ICD-10-CM

## 2016-10-26 DIAGNOSIS — Z853 Personal history of malignant neoplasm of breast: Secondary | ICD-10-CM | POA: Diagnosis not present

## 2016-10-26 DIAGNOSIS — G620 Drug-induced polyneuropathy: Secondary | ICD-10-CM | POA: Diagnosis not present

## 2016-10-26 DIAGNOSIS — Z5181 Encounter for therapeutic drug level monitoring: Secondary | ICD-10-CM | POA: Diagnosis not present

## 2016-10-26 DIAGNOSIS — M25561 Pain in right knee: Secondary | ICD-10-CM | POA: Diagnosis not present

## 2016-10-26 DIAGNOSIS — E119 Type 2 diabetes mellitus without complications: Secondary | ICD-10-CM | POA: Diagnosis not present

## 2016-10-26 DIAGNOSIS — I10 Essential (primary) hypertension: Secondary | ICD-10-CM | POA: Diagnosis not present

## 2016-10-26 DIAGNOSIS — M47816 Spondylosis without myelopathy or radiculopathy, lumbar region: Secondary | ICD-10-CM | POA: Insufficient documentation

## 2016-10-26 DIAGNOSIS — I890 Lymphedema, not elsewhere classified: Secondary | ICD-10-CM | POA: Diagnosis not present

## 2016-10-26 DIAGNOSIS — Z79899 Other long term (current) drug therapy: Secondary | ICD-10-CM | POA: Diagnosis not present

## 2016-10-26 DIAGNOSIS — G8929 Other chronic pain: Secondary | ICD-10-CM | POA: Insufficient documentation

## 2016-10-26 DIAGNOSIS — G894 Chronic pain syndrome: Secondary | ICD-10-CM | POA: Diagnosis not present

## 2016-10-26 DIAGNOSIS — M545 Low back pain: Secondary | ICD-10-CM | POA: Diagnosis not present

## 2016-10-26 DIAGNOSIS — M5416 Radiculopathy, lumbar region: Secondary | ICD-10-CM

## 2016-10-26 DIAGNOSIS — Z9221 Personal history of antineoplastic chemotherapy: Secondary | ICD-10-CM | POA: Diagnosis not present

## 2016-10-26 DIAGNOSIS — Z23 Encounter for immunization: Secondary | ICD-10-CM | POA: Diagnosis not present

## 2016-10-26 DIAGNOSIS — M5126 Other intervertebral disc displacement, lumbar region: Secondary | ICD-10-CM | POA: Diagnosis not present

## 2016-10-26 MED ORDER — OXYCODONE-ACETAMINOPHEN 10-325 MG PO TABS
1.0000 | ORAL_TABLET | Freq: Three times a day (TID) | ORAL | 0 refills | Status: DC | PRN
Start: 2016-10-26 — End: 2016-11-21

## 2016-10-26 MED ORDER — FENTANYL 12 MCG/HR TD PT72: 12.5000 ug | MEDICATED_PATCH | TRANSDERMAL | 0 refills | Status: DC

## 2016-10-26 NOTE — Progress Notes (Signed)
Subjective:    Patient ID: Krista Doyle, female    DOB: 10-Feb-1960, 56 y.o.   MRN: [id number]  HPI: Krista Doyle is a 56year old female who returns for follow up appointmentfor chronic pain and medication refill. She states her pain is located in her lower back radiating into her right lower extremity also has right knee pain. She rates her pain 6. Her current exercise regime is performing stretching exercises, and walking.  Last UDS was performed on 07/04/2016, it was consistent.   Pain Inventory Average Pain 8 Pain Right Now 6 My pain is constant, sharp and stabbing  In the last 24 hours, has pain interfered with the following? General activity 7 Relation with others 6 Enjoyment of life 6 What TIME of day is your pain at its worst? evening, night  Sleep (in general) Fair  Pain is worse with: walking, bending, sitting and standing Pain improves with: rest, medication and TENS Relief from Meds: 2  Mobility walk with assistance use a cane use a walker how many minutes can you walk? 10 ability to climb steps?  yes do you drive?  yes Do you have any goals in this area?  yes  Function disabled: date disabled . I need assistance with the following:  meal prep, household duties and shopping  Neuro/Psych weakness numbness tingling spasms  Prior Studies Any changes since last visit?  no  Physicians involved in your care Any changes since last visit?  no   Family History  Problem Relation Age of Onset  . Colon cancer Neg Hx    Social History   Social History  . Marital status: Married    Spouse name: N/A  . Number of children: N/A  . Years of education: N/A   Occupational History  . Disabled    Social History Main Topics  . Smoking status: Never Smoker  . Smokeless tobacco: Never Used  . Alcohol use No  . Drug use: No  . Sexual activity: Yes   Other Topics Concern  . None   Social History Narrative  . None   Past  Surgical History:  Procedure Laterality Date  . BACK SURGERY    . BREAST LUMPECTOMY    . COLONOSCOPY     about 3 yrs ago in [place]  . KNEE SURGERY     right  . TONSILLECTOMY     Past Medical History:  Diagnosis Date  . Asthma   . Back pain   . Breast cancer ([place]) 2008   left  . Diabetes ([place])   . Hypercholesterolemia   . Neuropathy    extremities after chemo   BP 110/75   Pulse 97   Resp 14   SpO2 97%   Opioid Risk Score:   Fall Risk Score:  `1  Depression screen PHQ 2/9  Depression screen [facility] 2/9 05/27/2016 03/28/2016 10/07/2015 08/31/2015 07/06/2015 04/28/2015 02/11/2015  Decreased Interest 0 0 0 1 0 1 1  Down, Depressed, Hopeless 0 0 0 0 0 0 0  PHQ - 2 Score 0 0 0 1 0 1 1  Altered sleeping - - - - - - 3  Tired, decreased energy - - - - - - 0  Change in appetite - - - - - - 0  Feeling bad or failure about yourself  - - - - - - 0  Trouble concentrating - - - - - - 0  Moving slowly or fidgety/restless - - - - - -  0  Suicidal thoughts - - - - - - 0  PHQ-9 Score - - - - - - 4  Difficult doing work/chores - - - - - - Somewhat difficult    Review of Systems  HENT: Negative.   Eyes: Negative.   Respiratory: Negative.   Cardiovascular: Negative.   Gastrointestinal: Positive for constipation.  Endocrine: Negative.        High blood sugar   Genitourinary: Negative.   Musculoskeletal: Positive for arthralgias and back pain.       Spasms  Skin: Negative.   Allergic/Immunologic: Negative.   Neurological: Positive for weakness, numbness and headaches.       Tingling  Hematological: Negative.   Psychiatric/Behavioral: Negative.   All other systems reviewed and are negative.      Objective:   Physical Exam  Constitutional: She is oriented to person, place, and time. She appears well-developed and well-nourished.  HENT:  Head: Normocephalic and atraumatic.  Neck: Normal range of motion. Neck supple.  Cardiovascular: Normal rate and regular rhythm.     Pulmonary/Chest: Effort normal and breath sounds normal.  Musculoskeletal:  Normal Muscle Bulk and muscle testing Reveals: Upper Extremities: Right: Full ROM and Muscle Strength 5/5 Left: Decreased ROM 90 Degrees and Muscle Strength 4/5 Lumbar Hypersensitivity Lower Extremities: Full ROM and Muscle Strength 5/5 Arises from Table Slowly using Straight Cane for Support Narrow Based gait  Neurological: She is alert and oriented to person, place, and time.  Skin: Skin is warm and dry.  Psychiatric: She has a normal mood and affect.  Nursing note and vitals reviewed.         Assessment & Plan:  1. Chronic low back pain, lumbar spondylosis, spondylolisthesis, with lumbar radiculopathy.10/26/2016 Refilled: Fentanyl 12 mcg one patch every three days #10 and Oxycodone 10/325 mg one tablet every 8 hours as needed for pain #90.  We will continue the opioid monitoring program, this consists of regular clinic visits, examinations, urine drug screen, pill counts as well as use of [place] Controlled Substance reporting System. 2. Chemotherapy induced peripheral neuropathy: 10/26/2016 Continue Lyrica at 150mg  TID   20 minutes of face to face patient care time was spent during this visit. All questions were encouraged and answered.  F/U in 1 month

## 2016-11-21 ENCOUNTER — Encounter
Payer: BLUE CROSS/BLUE SHIELD | Attending: Physical Medicine & Rehabilitation | Admitting: Physical Medicine & Rehabilitation

## 2016-11-21 ENCOUNTER — Encounter: Payer: Self-pay | Admitting: Physical Medicine & Rehabilitation

## 2016-11-21 VITALS — BP 125/88 | HR 93 | Resp 14

## 2016-11-21 DIAGNOSIS — M5416 Radiculopathy, lumbar region: Secondary | ICD-10-CM | POA: Diagnosis not present

## 2016-11-21 DIAGNOSIS — Z9221 Personal history of antineoplastic chemotherapy: Secondary | ICD-10-CM | POA: Insufficient documentation

## 2016-11-21 DIAGNOSIS — M47816 Spondylosis without myelopathy or radiculopathy, lumbar region: Secondary | ICD-10-CM | POA: Insufficient documentation

## 2016-11-21 DIAGNOSIS — Z853 Personal history of malignant neoplasm of breast: Secondary | ICD-10-CM | POA: Diagnosis not present

## 2016-11-21 DIAGNOSIS — M545 Low back pain: Secondary | ICD-10-CM | POA: Insufficient documentation

## 2016-11-21 DIAGNOSIS — I890 Lymphedema, not elsewhere classified: Secondary | ICD-10-CM | POA: Diagnosis not present

## 2016-11-21 DIAGNOSIS — T451X5A Adverse effect of antineoplastic and immunosuppressive drugs, initial encounter: Secondary | ICD-10-CM

## 2016-11-21 DIAGNOSIS — G8929 Other chronic pain: Secondary | ICD-10-CM | POA: Diagnosis not present

## 2016-11-21 DIAGNOSIS — G620 Drug-induced polyneuropathy: Secondary | ICD-10-CM | POA: Diagnosis not present

## 2016-11-21 MED ORDER — FENTANYL 12 MCG/HR TD PT72: 12.5000 ug | MEDICATED_PATCH | TRANSDERMAL | 0 refills | Status: DC

## 2016-11-21 MED ORDER — OXYCODONE-ACETAMINOPHEN 10-325 MG PO TABS: 1.0000 | ORAL_TABLET | Freq: Three times a day (TID) | ORAL | 0 refills | Status: DC | PRN

## 2016-11-21 MED ORDER — PREGABALIN 200 MG PO CAPS: 200.0000 mg | ORAL_CAPSULE | Freq: Two times a day (BID) | ORAL | 5 refills | Status: DC

## 2016-11-21 NOTE — Progress Notes (Signed)
Subjective:    Patient ID: Krista Doyle, female    DOB: 1960-04-08, 56 y.o.   MRN: [id number]  HPI   Pain Inventory Average Pain 7 Pain Right Now 6 My pain is sharp, stabbing and tingling  In the last 24 hours, has pain interfered with the following? General activity 7 Relation with others 6 Enjoyment of life 6 What TIME of day is your pain at its worst? evening, night Sleep (in general) Poor  Pain is worse with: walking, bending, sitting and standing Pain improves with: medication and TENS Relief from Meds: 7  Mobility walk with assistance use a cane how many minutes can you walk? 10 ability to climb steps?  yes do you drive?  yes transfers alone Do you have any goals in this area?  yes  Function disabled: date disabled .  Neuro/Psych No problems in this area  Prior Studies Any changes since last visit?  no  Physicians involved in your care Any changes since last visit?  no   Family History  Problem Relation Age of Onset  . Colon cancer Neg Hx    Social History   Social History  . Marital status: Married    Spouse name: N/A  . Number of children: N/A  . Years of education: N/A   Occupational History  . Disabled    Social History Main Topics  . Smoking status: Never Smoker  . Smokeless tobacco: Never Used  . Alcohol use No  . Drug use: No  . Sexual activity: Yes   Other Topics Concern  . Not on file   Social History Narrative  . No narrative on file   Past Surgical History:  Procedure Laterality Date  . BACK SURGERY    . BREAST LUMPECTOMY    . COLONOSCOPY     about 3 yrs ago in [place]  . KNEE SURGERY     right  . TONSILLECTOMY     Past Medical History:  Diagnosis Date  . Asthma   . Back pain   . Breast cancer ([place]) 2008   left  . Diabetes ([place])   . Hypercholesterolemia   . Neuropathy    extremities after chemo   There were no vitals taken for this visit.  Opioid Risk Score:   Fall Risk Score:   `1  Depression screen PHQ 2/9  Depression screen [facility] 2/9 05/27/2016 03/28/2016 10/07/2015 08/31/2015 07/06/2015 04/28/2015 02/11/2015  Decreased Interest 0 0 0 1 0 1 1  Down, Depressed, Hopeless 0 0 0 0 0 0 0  PHQ - 2 Score 0 0 0 1 0 1 1  Altered sleeping - - - - - - 3  Tired, decreased energy - - - - - - 0  Change in appetite - - - - - - 0  Feeling bad or failure about yourself  - - - - - - 0  Trouble concentrating - - - - - - 0  Moving slowly or fidgety/restless - - - - - - 0  Suicidal thoughts - - - - - - 0  PHQ-9 Score - - - - - - 4  Difficult doing work/chores - - - - - - Somewhat difficult    Review of Systems  Constitutional: Negative.   HENT: Negative.   Eyes: Negative.   Respiratory: Negative.   Cardiovascular: Positive for leg swelling.  Gastrointestinal: Positive for constipation.  Endocrine: Negative.   Genitourinary: Negative.   Musculoskeletal: Positive for back pain and gait  problem.  Skin: Negative.   Allergic/Immunologic: Negative.   Psychiatric/Behavioral: Negative.        Objective:   Physical Exam General: Alert and oriented x 3, No apparent distress. Well dressed  HEENT:Head is normocephalic, atraumatic, PERRLA, EOMI, sclera anicteric, oral mucosa pink and moist, dentition intact, ext ear canals clear,  Neck:Supple without JVD or lymphadenopathy  Heart: RRR  Chest:CTA b  Abdomen:Soft, non-tender, non-distended, bowel sounds positive.  Extremities:No clubbing, cyanosis, or edema. Pulses are 2+  Skin:Clean and intact without signs of breakdown  Neuro:cognitively intact. Distal sensory loss. Right leg with give away weakness.   Musculoskeletal:Low back TTP Psych:Pleasant and appropriate    Assessment & Plan:  1. Chronic low back pain, lumbar spondylosis, spondylolisthesis, likely lumbar-sacral radiculopathy, and facet arthropathy -has had history of discectomy  -pt with multi-level facet disease--hx of positive results with MBB's -continue  percocet 10/325- two to three tabs daily prn #75  -continue fentanyl patch 74mcg for more continuous pain relief, #10  -continue HEP and topical modalities -continue lyrica for any radicular component to pain--refilled today We will continue the opioid monitoring program, this consists of regular clinic visits, examinations, urine drug screen, pill counts as well as use of [place] Controlled Substance Reporting System. [place] today.    2. Chemotherapy induced peripheral neuropathy  -lyrica at 150mg  TID has been effective--  3. Breast Cancer in remission.  25 minutes of face to face patient care time were spent during this visit. All questions were encouraged and answered. We'll see her back in about a month.

## 2016-11-21 NOTE — Patient Instructions (Signed)
CONTINUE WITH YOUR HOME EXERCISES AND STRETCHES !!!   PLEASE FEEL FREE TO CALL OUR OFFICE WITH ANY PROBLEMS OR QUESTIONS ([phone])

## 2016-11-22 DIAGNOSIS — L80 Vitiligo: Secondary | ICD-10-CM | POA: Diagnosis not present

## 2016-11-28 DIAGNOSIS — M542 Cervicalgia: Secondary | ICD-10-CM | POA: Diagnosis not present

## 2016-11-28 DIAGNOSIS — M545 Low back pain: Secondary | ICD-10-CM | POA: Diagnosis not present

## 2016-12-16 ENCOUNTER — Other Ambulatory Visit: Payer: Self-pay

## 2016-12-16 ENCOUNTER — Telehealth: Payer: Self-pay | Admitting: Registered Nurse

## 2016-12-16 ENCOUNTER — Encounter: Payer: BLUE CROSS/BLUE SHIELD | Attending: Physical Medicine & Rehabilitation | Admitting: Registered Nurse

## 2016-12-16 ENCOUNTER — Encounter: Payer: Self-pay | Admitting: Registered Nurse

## 2016-12-16 VITALS — BP 124/83 | HR 92 | Resp 14

## 2016-12-16 DIAGNOSIS — I890 Lymphedema, not elsewhere classified: Secondary | ICD-10-CM | POA: Diagnosis not present

## 2016-12-16 DIAGNOSIS — M545 Low back pain: Secondary | ICD-10-CM | POA: Diagnosis not present

## 2016-12-16 DIAGNOSIS — G894 Chronic pain syndrome: Secondary | ICD-10-CM

## 2016-12-16 DIAGNOSIS — H8111 Benign paroxysmal vertigo, right ear: Secondary | ICD-10-CM | POA: Diagnosis not present

## 2016-12-16 DIAGNOSIS — T451X5A Adverse effect of antineoplastic and immunosuppressive drugs, initial encounter: Secondary | ICD-10-CM | POA: Diagnosis not present

## 2016-12-16 DIAGNOSIS — G8929 Other chronic pain: Secondary | ICD-10-CM | POA: Insufficient documentation

## 2016-12-16 DIAGNOSIS — M546 Pain in thoracic spine: Secondary | ICD-10-CM

## 2016-12-16 DIAGNOSIS — Z9221 Personal history of antineoplastic chemotherapy: Secondary | ICD-10-CM | POA: Diagnosis not present

## 2016-12-16 DIAGNOSIS — G620 Drug-induced polyneuropathy: Secondary | ICD-10-CM | POA: Diagnosis not present

## 2016-12-16 DIAGNOSIS — Z5181 Encounter for therapeutic drug level monitoring: Secondary | ICD-10-CM | POA: Diagnosis not present

## 2016-12-16 DIAGNOSIS — Z853 Personal history of malignant neoplasm of breast: Secondary | ICD-10-CM | POA: Diagnosis not present

## 2016-12-16 DIAGNOSIS — M5416 Radiculopathy, lumbar region: Secondary | ICD-10-CM

## 2016-12-16 DIAGNOSIS — Z79899 Other long term (current) drug therapy: Secondary | ICD-10-CM | POA: Diagnosis not present

## 2016-12-16 DIAGNOSIS — M47816 Spondylosis without myelopathy or radiculopathy, lumbar region: Secondary | ICD-10-CM | POA: Diagnosis not present

## 2016-12-16 MED ORDER — OXYCODONE-ACETAMINOPHEN 10-325 MG PO TABS: 1.0000 | ORAL_TABLET | Freq: Three times a day (TID) | ORAL | 0 refills | Status: DC | PRN

## 2016-12-16 MED ORDER — FENTANYL 12 MCG/HR TD PT72: 12.5000 ug | MEDICATED_PATCH | TRANSDERMAL | 0 refills | Status: DC

## 2016-12-16 NOTE — Progress Notes (Signed)
Subjective:    Patient ID: Krista Doyle, female    DOB: July 17, 1960, 56 y.o.   MRN: [id number]  HPI: [name] is a 56year old female who returns for follow up appointmentfor chronic pain and medication refill. She states her pain is located in her mid-lower back radiating into her right lower extremity. She rates her pain 6. Her current exercise regime is performing stretching exercises, and walking.  Krista Doyle Morphine equivalent is 67.38 MME.    Last UDS was performed on 06/24/2016, it was consistent.   Pain Inventory Average Pain 8 Pain Right Now 6 My pain is constant, sharp and stabbing  In the last 24 hours, has pain interfered with the following? General activity 6 Relation with others 6 Enjoyment of life 6 What TIME of day is your pain at its worst? evening, night  Sleep (in general) Fair  Pain is worse with: walking, bending, sitting and standing Pain improves with: rest, heat/ice and medication Relief from Meds: 8  Mobility walk with assistance use a cane use a walker how many minutes can you walk? 10 ability to climb steps?  yes do you drive?  yes Do you have any goals in this area?  yes  Function disabled: date disabled .  Neuro/Psych numbness tingling spasms  Prior Studies Any changes since last visit?  no  Physicians involved in your care Any changes since last visit?  no   Family History  Problem Relation Age of Onset  . Colon cancer Neg Hx    Social History   Socioeconomic History  . Marital status: Married    Spouse name: Not on file  . Number of children: Not on file  . Years of education: Not on file  . Highest education level: Not on file  Social Needs  . Financial resource strain: Not on file  . Food insecurity - worry: Not on file  . Food insecurity - inability: Not on file  . Transportation needs - medical: Not on file  . Transportation needs - non-medical: Not on file  Occupational  History  . Occupation: Disabled  Tobacco Use  . Smoking status: Never Smoker  . Smokeless tobacco: Never Used  Substance and Sexual Activity  . Alcohol use: No    Alcohol/week: 0.0 oz  . Drug use: No  . Sexual activity: Yes  Other Topics Concern  . Not on file  Social History Narrative  . Not on file   Past Surgical History:  Procedure Laterality Date  . BACK SURGERY    . BREAST LUMPECTOMY    . COLONOSCOPY     about 3 yrs ago in [place]  . KNEE SURGERY     right  . TONSILLECTOMY     Past Medical History:  Diagnosis Date  . Asthma   . Back pain   . Breast cancer ([place]) 2008   left  . Diabetes ([place])   . Hypercholesterolemia   . Neuropathy    extremities after chemo   BP 124/83 (BP Location: Right Arm, Patient Position: Sitting, Cuff Size: Normal)   Pulse 92   Resp 14   SpO2 97%   Opioid Risk Score:   Fall Risk Score:  `1  Depression screen PHQ 2/9  Depression screen Krista Doyle 2/9 05/27/2016 03/28/2016 10/07/2015 08/31/2015 07/06/2015 04/28/2015 02/11/2015  Decreased Interest 0 0 0 1 0 1 1  Down, Depressed, Hopeless 0 0 0 0 0 0 0  PHQ - 2 Score 0 0 0  1 0 1 1  Altered sleeping - - - - - - 3  Tired, decreased energy - - - - - - 0  Change in appetite - - - - - - 0  Feeling bad or failure about yourself  - - - - - - 0  Trouble concentrating - - - - - - 0  Moving slowly or fidgety/restless - - - - - - 0  Suicidal thoughts - - - - - - 0  PHQ-9 Score - - - - - - 4  Difficult doing work/chores - - - - - - Somewhat difficult    Review of Systems  HENT: Negative.   Eyes: Negative.   Respiratory: Negative.   Cardiovascular: Negative.   Gastrointestinal: Negative.   Endocrine:       High blood sugar   Genitourinary: Negative.   Musculoskeletal: Positive for arthralgias and back pain.       Spasms  Skin: Negative.   Allergic/Immunologic: Negative.   Neurological: Positive for weakness, numbness and headaches.       Tingling  Hematological: Negative.     Psychiatric/Behavioral: Negative.   All other systems reviewed and are negative.      Objective:   Physical Exam  Constitutional: She is oriented to person, place, and time. She appears well-developed and well-nourished.  HENT:  Head: Normocephalic and atraumatic.  Neck: Normal range of motion. Neck supple.  Cardiovascular: Normal rate and regular rhythm.  Pulmonary/Chest: Effort normal and breath sounds normal.  Musculoskeletal:  Normal Muscle Bulk and muscle testing Reveals: Upper Extremities: Right: Full ROM and Muscle Strength 5/5 Left: Decreased ROM 90 Degrees and Muscle Strength 4/5 Thoracic Paraspinal Tenderness: T-7-T-9 Lumbar Paraspinal Tenderness: L-3-L-5 Lower Extremities: Full ROM and Muscle Strength 5/5 Arises from Table Slowly using Straight Cane for Support Narrow Based gait  Neurological: She is alert and oriented to person, place, and time.  Skin: Skin is warm and dry.  Psychiatric: She has a normal mood and affect.  Nursing note and vitals reviewed.         Assessment & Plan:  1. Chronic low back pain, Lumbar Facet Arthropathy,lumbar spondylosis, spondylolisthesis, with lumbar radiculopathy.12/16/2016 Refilled: Fentanyl 12 mcg one patch every three days #10 and Oxycodone 10/325 mg one tablet every 8 hours as needed for pain #90.  We will continue the opioid monitoring program, this consists of regular clinic visits, examinations, urine drug screen, pill counts as well as use of [place] Controlled Substance reporting System. 2. Chemotherapy induced peripheral neuropathy:Continue Lyrica at 150mg  TID. 12/16/2016 3. Chronic Midline Thoracic Pain: Continue HEP and Current Medication Regime. 12/16/2016  20 minutes of face to face patient care time was spent during this visit. All questions were encouraged and answered.  F/U in 1 month

## 2016-12-16 NOTE — Telephone Encounter (Signed)
On 12/16/2016 the  [place] was reviewed no conflict was seen on the [place] with multiple prescribers. Ms. Catino  has a signed narcotic contract with our office. If there were any discrepancies this would have been reported to her physician.

## 2016-12-19 ENCOUNTER — Ambulatory Visit: Payer: BLUE CROSS/BLUE SHIELD | Admitting: Registered Nurse

## 2017-01-13 ENCOUNTER — Encounter: Payer: Self-pay | Admitting: Registered Nurse

## 2017-01-13 ENCOUNTER — Other Ambulatory Visit: Payer: Self-pay

## 2017-01-13 ENCOUNTER — Encounter: Payer: BLUE CROSS/BLUE SHIELD | Attending: Physical Medicine & Rehabilitation | Admitting: Registered Nurse

## 2017-01-13 VITALS — BP 134/87 | HR 85

## 2017-01-13 DIAGNOSIS — M546 Pain in thoracic spine: Secondary | ICD-10-CM

## 2017-01-13 DIAGNOSIS — G894 Chronic pain syndrome: Secondary | ICD-10-CM | POA: Diagnosis not present

## 2017-01-13 DIAGNOSIS — Z79899 Other long term (current) drug therapy: Secondary | ICD-10-CM

## 2017-01-13 DIAGNOSIS — Z5181 Encounter for therapeutic drug level monitoring: Secondary | ICD-10-CM

## 2017-01-13 DIAGNOSIS — M5416 Radiculopathy, lumbar region: Secondary | ICD-10-CM

## 2017-01-13 DIAGNOSIS — Z9221 Personal history of antineoplastic chemotherapy: Secondary | ICD-10-CM | POA: Diagnosis not present

## 2017-01-13 DIAGNOSIS — G620 Drug-induced polyneuropathy: Secondary | ICD-10-CM

## 2017-01-13 DIAGNOSIS — G8929 Other chronic pain: Secondary | ICD-10-CM | POA: Insufficient documentation

## 2017-01-13 DIAGNOSIS — T402X5A Adverse effect of other opioids, initial encounter: Secondary | ICD-10-CM | POA: Diagnosis not present

## 2017-01-13 DIAGNOSIS — I890 Lymphedema, not elsewhere classified: Secondary | ICD-10-CM | POA: Diagnosis not present

## 2017-01-13 DIAGNOSIS — T451X5A Adverse effect of antineoplastic and immunosuppressive drugs, initial encounter: Secondary | ICD-10-CM | POA: Diagnosis not present

## 2017-01-13 DIAGNOSIS — Z853 Personal history of malignant neoplasm of breast: Secondary | ICD-10-CM | POA: Diagnosis not present

## 2017-01-13 DIAGNOSIS — M545 Low back pain: Secondary | ICD-10-CM | POA: Diagnosis not present

## 2017-01-13 DIAGNOSIS — M47816 Spondylosis without myelopathy or radiculopathy, lumbar region: Secondary | ICD-10-CM | POA: Diagnosis not present

## 2017-01-13 DIAGNOSIS — K5903 Drug induced constipation: Secondary | ICD-10-CM

## 2017-01-13 MED ORDER — CYCLOBENZAPRINE HCL 10 MG PO TABS
10.0000 mg | ORAL_TABLET | Freq: Two times a day (BID) | ORAL | 3 refills | Status: DC
Start: 2017-01-13 — End: 2017-04-21

## 2017-01-13 MED ORDER — FENTANYL 12 MCG/HR TD PT72: 12.5000 ug | MEDICATED_PATCH | TRANSDERMAL | 0 refills | Status: DC

## 2017-01-13 MED ORDER — NALOXEGOL OXALATE 25 MG PO TABS: 25.0000 mg | ORAL_TABLET | Freq: Every day | ORAL | 3 refills | Status: AC

## 2017-01-13 MED ORDER — OXYCODONE-ACETAMINOPHEN 10-325 MG PO TABS: 1.0000 | ORAL_TABLET | Freq: Three times a day (TID) | ORAL | 0 refills | Status: DC | PRN

## 2017-01-13 NOTE — Progress Notes (Signed)
Subjective:    Patient ID: Krista Doyle, female    DOB: 15-Oct-1960, 56 y.o.   MRN: [id number]  HPI: [name] is a 56year old female who returns for follow up appointmentfor chronic pain and medication refill. She states her pain is located in her mid-lower back radiating into her right lower extremity and right knee pain. She rates her pain 7. Her current exercise regime is performing stretching exercises, and walking.  [name] Morphine equivalent is 73.Marland Kitchen80 MME.    Last UDS was performed on 06/24/2016, it was consistent.   Pain Inventory Average Pain 6 Pain Right Now 7 My pain is constant, sharp and stabbing  In the last 24 hours, has pain interfered with the following? General activity 9 Relation with others 6 Enjoyment of life 6 What TIME of day is your pain at its worst? evening and night, night  Sleep (in general) Fair  Pain is worse with: walking, bending and sitting Pain improves with: rest, heat/ice, medication and TENS Relief from Meds: 5  Mobility walk with assistance use a cane use a walker how many minutes can you walk? 10 ability to climb steps?  yes do you drive?  yes Do you have any goals in this area?  yes  Function disabled: date disabled .  Neuro/Psych weakness numbness spasms  Prior Studies Any changes since last visit?  no  Physicians involved in your care Any changes since last visit?  no   Family History  Problem Relation Age of Onset  . Colon cancer Neg Hx    Social History   Socioeconomic History  . Marital status: Married    Spouse name: None  . Number of children: None  . Years of education: None  . Highest education level: None  Social Needs  . Financial resource strain: None  . Food insecurity - worry: None  . Food insecurity - inability: None  . Transportation needs - medical: None  . Transportation needs - non-medical: None  Occupational History  . Occupation: Disabled    Tobacco Use  . Smoking status: Never Smoker  . Smokeless tobacco: Never Used  Substance and Sexual Activity  . Alcohol use: No    Alcohol/week: 0.0 oz  . Drug use: No  . Sexual activity: Yes  Other Topics Concern  . None  Social History Narrative  . None   Past Surgical History:  Procedure Laterality Date  . BACK SURGERY    . BREAST LUMPECTOMY    . COLONOSCOPY     about 3 yrs ago in [place]  . KNEE SURGERY     right  . TONSILLECTOMY     Past Medical History:  Diagnosis Date  . Asthma   . Back pain   . Breast cancer ([place]) 2008   left  . Diabetes ([place])   . Hypercholesterolemia   . Neuropathy    extremities after chemo   BP (!) 146/84   Pulse 96   SpO2 90%   Opioid Risk Score:  0 Fall Risk Score:  `1  Depression screen PHQ 2/9  Depression screen [facility] 2/9 01/13/2017 05/27/2016 03/28/2016 10/07/2015 08/31/2015 07/06/2015 04/28/2015  Decreased Interest 0 0 0 0 1 0 1  Down, Depressed, Hopeless 0 0 0 0 0 0 0  PHQ - 2 Score 0 0 0 0 1 0 1  Altered sleeping - - - - - - -  Tired, decreased energy - - - - - - -  Change in  appetite - - - - - - -  Feeling bad or failure about yourself  - - - - - - -  Trouble concentrating - - - - - - -  Moving slowly or fidgety/restless - - - - - - -  Suicidal thoughts - - - - - - -  PHQ-9 Score - - - - - - -  Difficult doing work/chores - - - - - - -    Review of Systems  HENT: Negative.   Eyes: Negative.   Respiratory: Negative.   Cardiovascular: Negative.   Gastrointestinal: Positive for constipation.  Endocrine:       High blood sugar   Genitourinary: Negative.   Musculoskeletal: Positive for arthralgias and back pain.       Spasms  Skin: Negative.   Allergic/Immunologic: Negative.   Neurological: Positive for weakness, numbness and headaches.       Tingling  Hematological: Negative.   Psychiatric/Behavioral: Negative.   All other systems reviewed and are negative.      Objective:   Physical Exam  Constitutional:  She is oriented to person, place, and time. She appears well-developed and well-nourished.  HENT:  Head: Normocephalic and atraumatic.  Neck: Normal range of motion. Neck supple.  Cardiovascular: Normal rate and regular rhythm.  Pulmonary/Chest: Effort normal and breath sounds normal.  Musculoskeletal:  Normal Muscle Bulk and muscle testing Reveals: Upper Extremities: Right: Full ROM and Muscle Strength 5/5 Left: Decreased ROM 45 Degrees and Muscle Strength 4/5 Thoracic Paraspinal Tenderness: T-10-T-12 Lumbar Paraspinal Tenderness: L-3-L-5 Lower Extremities: Full ROM and Muscle Strength 5/5 Arises from Table Slowly using Straight Cane for Support Narrow Based gait  Neurological: She is alert and oriented to person, place, and time.  Skin: Skin is warm and dry.  Psychiatric: She has a normal mood and affect.  Nursing note and vitals reviewed.         Assessment & Plan:  1. Chronic low back pain, Lumbar Facet Arthropathy,lumbar spondylosis, spondylolisthesis, with lumbar radiculopathy.01/13/2017 Refilled: Fentanyl 12 mcg one patch every three days #10 and Oxycodone 10/325 mg one tablet every 8 hours as needed for pain #90.  We will continue the opioid monitoring program, this consists of regular clinic visits, examinations, urine drug screen, pill counts as well as use of [place] Controlled Substance reporting System. 2. Chemotherapy induced peripheral neuropathy:Continue Lyrica at 150mg  TID. 01/13/2017 3. Chronic Midline Thoracic Pain: Continue HEP and Current Medication Regime. 01/13/2017 4. Muscle Spasm: Continue Flexeril.01/13/2017.  20 minutes of face to face patient care time was spent during this visit. All questions were encouraged and answered.  F/U in 1 month

## 2017-01-20 ENCOUNTER — Ambulatory Visit: Payer: BLUE CROSS/BLUE SHIELD | Admitting: Registered Nurse

## 2017-02-24 ENCOUNTER — Encounter: Payer: Self-pay | Admitting: Registered Nurse

## 2017-02-24 ENCOUNTER — Encounter: Payer: BLUE CROSS/BLUE SHIELD | Attending: Physical Medicine & Rehabilitation | Admitting: Registered Nurse

## 2017-02-24 VITALS — BP 122/84 | HR 89

## 2017-02-24 DIAGNOSIS — T402X5A Adverse effect of other opioids, initial encounter: Secondary | ICD-10-CM

## 2017-02-24 DIAGNOSIS — M47816 Spondylosis without myelopathy or radiculopathy, lumbar region: Secondary | ICD-10-CM

## 2017-02-24 DIAGNOSIS — M545 Low back pain: Secondary | ICD-10-CM | POA: Diagnosis not present

## 2017-02-24 DIAGNOSIS — K5903 Drug induced constipation: Secondary | ICD-10-CM | POA: Diagnosis not present

## 2017-02-24 DIAGNOSIS — M5416 Radiculopathy, lumbar region: Secondary | ICD-10-CM

## 2017-02-24 DIAGNOSIS — G894 Chronic pain syndrome: Secondary | ICD-10-CM

## 2017-02-24 DIAGNOSIS — Z79899 Other long term (current) drug therapy: Secondary | ICD-10-CM

## 2017-02-24 DIAGNOSIS — Z5181 Encounter for therapeutic drug level monitoring: Secondary | ICD-10-CM | POA: Diagnosis not present

## 2017-02-24 DIAGNOSIS — Z853 Personal history of malignant neoplasm of breast: Secondary | ICD-10-CM | POA: Insufficient documentation

## 2017-02-24 DIAGNOSIS — I890 Lymphedema, not elsewhere classified: Secondary | ICD-10-CM | POA: Diagnosis not present

## 2017-02-24 DIAGNOSIS — T451X5A Adverse effect of antineoplastic and immunosuppressive drugs, initial encounter: Secondary | ICD-10-CM

## 2017-02-24 DIAGNOSIS — G620 Drug-induced polyneuropathy: Secondary | ICD-10-CM | POA: Diagnosis not present

## 2017-02-24 DIAGNOSIS — Z9221 Personal history of antineoplastic chemotherapy: Secondary | ICD-10-CM | POA: Insufficient documentation

## 2017-02-24 DIAGNOSIS — G8929 Other chronic pain: Secondary | ICD-10-CM | POA: Diagnosis not present

## 2017-02-24 MED ORDER — FENTANYL 12 MCG/HR TD PT72: 12.5000 ug | MEDICATED_PATCH | TRANSDERMAL | 0 refills | Status: DC

## 2017-02-24 MED ORDER — PREGABALIN 200 MG PO CAPS: 200.0000 mg | ORAL_CAPSULE | Freq: Two times a day (BID) | ORAL | 5 refills | Status: DC

## 2017-02-24 MED ORDER — OXYCODONE-ACETAMINOPHEN 10-325 MG PO TABS
1.0000 | ORAL_TABLET | Freq: Three times a day (TID) | ORAL | 0 refills | Status: DC | PRN
Start: 2017-02-24 — End: 2017-03-24

## 2017-02-24 NOTE — Progress Notes (Signed)
Subjective:    Patient ID: Krista Doyle, female    DOB: May 07, 1960, 57 y.o.   MRN: [id number]  HPI: Krista Doyle is a 57year old female who returns for follow up appointmentfor chronic pain and medication refill. She states her pain is located in her lower back radiating into her right lower extremity.. She rates her pain 6. . Her current exercise regime is walking and performing stretching exercises.   Krista Doyle equivalent is 66.54 MME.    Last UDS was performed on 06/24/2016, it was consistent. Oral Swab Performed Today.   Pain Inventory Average Pain 7 Pain Right Now 6 My pain is constant, sharp and stabbing  In the last 24 hours, has pain interfered with the following? General activity 6 Relation with others 6 Enjoyment of life 9 What TIME of day is your pain at its worst? evening and night, night  Sleep (in general) Fair  Pain is worse with: walking, bending and sitting Pain improves with: rest, heat/ice, medication and TENS Relief from Meds: 5  Mobility walk with assistance use a cane use a walker how many minutes can you walk? 10 ability to climb steps?  yes do you drive?  yes Do you have any goals in this area?  yes  Function disabled: date disabled .  Neuro/Psych weakness numbness spasms  Prior Studies Any changes since last visit?  no  Physicians involved in your care Any changes since last visit?  no   Family History  Problem Relation Age of Onset  . Colon cancer Neg Hx    Social History   Socioeconomic History  . Marital status: Married    Spouse name: Not on file  . Number of children: Not on file  . Years of education: Not on file  . Highest education level: Not on file  Social Needs  . Financial resource strain: Not on file  . Food insecurity - worry: Not on file  . Food insecurity - inability: Not on file  . Transportation needs - medical: Not on file  . Transportation needs - non-medical:  Not on file  Occupational History  . Occupation: Disabled  Tobacco Use  . Smoking status: Never Smoker  . Smokeless tobacco: Never Used  Substance and Sexual Activity  . Alcohol use: No    Alcohol/week: 0.0 oz  . Drug use: No  . Sexual activity: Yes  Other Topics Concern  . Not on file  Social History Narrative  . Not on file   Past Surgical History:  Procedure Laterality Date  . BACK SURGERY    . BREAST LUMPECTOMY    . COLONOSCOPY     about 3 yrs ago in [place]  . KNEE SURGERY     right  . TONSILLECTOMY     Past Medical History:  Diagnosis Date  . Asthma   . Back pain   . Breast cancer ([place]) 2008   left  . Diabetes ([place])   . Hypercholesterolemia   . Neuropathy    extremities after chemo   There were no vitals taken for this visit.  Opioid Risk Score:  0 Fall Risk Score:  `1  Depression screen PHQ 2/9  Depression screen [facility] 2/9 01/13/2017 05/27/2016 03/28/2016 10/07/2015 08/31/2015 07/06/2015 04/28/2015  Decreased Interest 0 0 0 0 1 0 1  Down, Depressed, Hopeless 0 0 0 0 0 0 0  PHQ - 2 Score 0 0 0 0 1 0 1  Altered sleeping - - - - - - -  Tired, decreased energy - - - - - - -  Change in appetite - - - - - - -  Feeling bad or failure about yourself  - - - - - - -  Trouble concentrating - - - - - - -  Moving slowly or fidgety/restless - - - - - - -  Suicidal thoughts - - - - - - -  PHQ-9 Score - - - - - - -  Difficult doing work/chores - - - - - - -    Review of Systems  Constitutional: Positive for unexpected weight change.  HENT: Negative.   Eyes: Negative.   Respiratory: Negative.   Cardiovascular: Negative.   Gastrointestinal: Positive for constipation.  Endocrine:       High blood sugar   Genitourinary: Negative.   Musculoskeletal: Positive for arthralgias and back pain.       Spasms  Skin: Negative.   Allergic/Immunologic: Negative.   Neurological: Positive for weakness, numbness and headaches.       Tingling  Hematological: Negative.     Psychiatric/Behavioral: Negative.   All other systems reviewed and are negative.      Objective:   Physical Exam  Constitutional: She is oriented to person, place, and time. She appears well-developed and well-nourished.  HENT:  Head: Normocephalic and atraumatic.  Neck: Normal range of motion. Neck supple.  Cardiovascular: Normal rate and regular rhythm.  Pulmonary/Chest: Effort normal and breath sounds normal.  Musculoskeletal:  Normal Muscle Bulk and Muscle Testing Reveals: Upper Extremities: Right:  Full ROM and Muscle Strength 5/5 Right AC Joint Tenderness Left: Decreased ROM 90 Degrees and Muscle Strength 4/5 Thoracic Hypersensitivity: T-1- T-7 Lumbar Paraspinal Tenderness: L-3-L-5 Lower Extremities: Right: Decreased ROM and Muscle Strength 5/5 Left: Decreased ROM and Muscle Strength 4/5 Arises from Table Slowly using Straight Cane for Support Narrow Based gait  Neurological: She is alert and oriented to person, place, and time.  Skin: Skin is warm and dry.  Psychiatric: She has a normal mood and affect.  Nursing note and vitals reviewed.         Assessment & Plan:  1. Chronic low back pain, Lumbar Facet Arthropathy,lumbar spondylosis, spondylolisthesis, with lumbar radiculopathy.02/24/2017 Refilled: Fentanyl 12 mcg one patch every three days #10 and Oxycodone 10/325 mg one tablet every 8 hours as needed for pain #90.  We will continue the opioid monitoring program, this consists of regular clinic visits, examinations, urine drug screen, pill counts as well as use of [place] Controlled Substance reporting System. 2. Chemotherapy induced peripheral neuropathy:Continue Lyrica at 150mg  TID. 02/24/2017 3. Chronic Midline Thoracic Pain: Continue HEP and Current Medication Regime. 02/24/2017 4. Muscle Spasm: Continue Flexeril.02/24/2017  20 minutes of face to face patient care time was spent during this visit. All questions were encouraged and answered.  F/U in  1 month

## 2017-03-01 ENCOUNTER — Telehealth: Payer: Self-pay | Admitting: *Deleted

## 2017-03-01 LAB — DRUG TOX MONITOR 1 W/CONF, ORAL FLD
Amphetamines: NEGATIVE ng/mL (ref <10)
Barbiturates: NEGATIVE ng/mL (ref <10)
Benzodiazepines: NEGATIVE ng/mL (ref <0.50)
Buprenorphine: NEGATIVE ng/mL (ref <0.10)
Cocaine: NEGATIVE ng/mL (ref <5.0)
Codeine: NEGATIVE ng/mL (ref <2.5)
Dihydrocodeine: NEGATIVE ng/mL (ref <2.5)
Fentanyl: 0.25 ng/mL — ABNORMAL HIGH (ref <0.10)
Fentanyl: POSITIVE ng/mL — AB (ref <0.10)
Heroin Metabolite: NEGATIVE ng/mL (ref <1.0)
Hydrocodone: NEGATIVE ng/mL (ref <2.5)
Hydromorphone: NEGATIVE ng/mL (ref <2.5)
MARIJUANA: NEGATIVE ng/mL (ref <2.5)
MDMA: NEGATIVE ng/mL (ref <10)
Meprobamate: NEGATIVE ng/mL (ref <2.5)
Methadone: NEGATIVE ng/mL (ref <5.0)
Morphine: NEGATIVE ng/mL (ref <2.5)
Nicotine Metabolite: NEGATIVE ng/mL (ref <5.0)
Norhydrocodone: NEGATIVE ng/mL (ref <2.5)
Noroxycodone: NEGATIVE ng/mL (ref <2.5)
Opiates: POSITIVE ng/mL — AB (ref <2.5)
Oxycodone: 17.3 ng/mL — ABNORMAL HIGH (ref <2.5)
Oxymorphone: NEGATIVE ng/mL (ref <2.5)
Phencyclidine: NEGATIVE ng/mL (ref <10)
Tapentadol: NEGATIVE ng/mL (ref <5.0)
Tramadol: NEGATIVE ng/mL (ref <5.0)
Zolpidem: NEGATIVE ng/mL (ref <5.0)

## 2017-03-01 LAB — DRUG TOX ALC METAB W/CON, ORAL FLD: Alcohol Metabolite: NEGATIVE ng/mL (ref <25)

## 2017-03-01 NOTE — Telephone Encounter (Signed)
Oral swab drug screen was consistent for prescribed medications.  ?

## 2017-03-02 ENCOUNTER — Other Ambulatory Visit (HOSPITAL_COMMUNITY): Payer: Self-pay | Admitting: Oncology

## 2017-03-02 ENCOUNTER — Ambulatory Visit (HOSPITAL_COMMUNITY): Payer: BLUE CROSS/BLUE SHIELD

## 2017-03-02 DIAGNOSIS — Z1231 Encounter for screening mammogram for malignant neoplasm of breast: Secondary | ICD-10-CM

## 2017-03-03 ENCOUNTER — Inpatient Hospital Stay (HOSPITAL_COMMUNITY): Admission: RE | Admit: 2017-03-03 | Payer: BLUE CROSS/BLUE SHIELD | Source: Ambulatory Visit

## 2017-03-03 ENCOUNTER — Encounter (HOSPITAL_COMMUNITY): Payer: BLUE CROSS/BLUE SHIELD

## 2017-03-06 ENCOUNTER — Other Ambulatory Visit (HOSPITAL_COMMUNITY): Payer: BLUE CROSS/BLUE SHIELD

## 2017-03-06 ENCOUNTER — Ambulatory Visit (HOSPITAL_COMMUNITY): Payer: BLUE CROSS/BLUE SHIELD

## 2017-03-08 ENCOUNTER — Ambulatory Visit (HOSPITAL_COMMUNITY)
Admission: RE | Admit: 2017-03-08 | Discharge: 2017-03-08 | Disposition: A | Discharge status: Home / Self Care | Payer: BLUE CROSS/BLUE SHIELD | Source: Ambulatory Visit | Attending: Oncology | Admitting: Oncology

## 2017-03-08 ENCOUNTER — Encounter (HOSPITAL_COMMUNITY): Payer: Self-pay

## 2017-03-08 DIAGNOSIS — Z13820 Encounter for screening for osteoporosis: Secondary | ICD-10-CM | POA: Insufficient documentation

## 2017-03-08 DIAGNOSIS — Z1231 Encounter for screening mammogram for malignant neoplasm of breast: Secondary | ICD-10-CM | POA: Insufficient documentation

## 2017-03-08 DIAGNOSIS — M8580 Other specified disorders of bone density and structure, unspecified site: Secondary | ICD-10-CM | POA: Insufficient documentation

## 2017-03-08 DIAGNOSIS — Z780 Asymptomatic menopausal state: Secondary | ICD-10-CM | POA: Diagnosis not present

## 2017-03-08 DIAGNOSIS — M85852 Other specified disorders of bone density and structure, left thigh: Secondary | ICD-10-CM | POA: Diagnosis not present

## 2017-03-08 HISTORY — DX: Personal history of antineoplastic chemotherapy: Z92.21

## 2017-03-08 HISTORY — DX: Personal history of irradiation: Z92.3

## 2017-03-16 ENCOUNTER — Inpatient Hospital Stay (HOSPITAL_COMMUNITY): Payer: BLUE CROSS/BLUE SHIELD

## 2017-03-16 ENCOUNTER — Other Ambulatory Visit: Payer: Self-pay

## 2017-03-16 ENCOUNTER — Inpatient Hospital Stay (HOSPITAL_COMMUNITY): Payer: BLUE CROSS/BLUE SHIELD | Attending: Internal Medicine

## 2017-03-16 ENCOUNTER — Encounter (HOSPITAL_COMMUNITY): Payer: Self-pay

## 2017-03-16 VITALS — BP 141/88 | HR 96 | Temp 98.7°F | Resp 20

## 2017-03-16 DIAGNOSIS — E559 Vitamin D deficiency, unspecified: Secondary | ICD-10-CM | POA: Insufficient documentation

## 2017-03-16 DIAGNOSIS — Z9223 Personal history of estrogen therapy: Secondary | ICD-10-CM | POA: Insufficient documentation

## 2017-03-16 DIAGNOSIS — Z853 Personal history of malignant neoplasm of breast: Secondary | ICD-10-CM | POA: Diagnosis not present

## 2017-03-16 DIAGNOSIS — C50412 Malignant neoplasm of upper-outer quadrant of left female breast: Secondary | ICD-10-CM

## 2017-03-16 DIAGNOSIS — Z170 Estrogen receptor positive status [ER+]: Secondary | ICD-10-CM

## 2017-03-16 DIAGNOSIS — Z9221 Personal history of antineoplastic chemotherapy: Secondary | ICD-10-CM | POA: Insufficient documentation

## 2017-03-16 DIAGNOSIS — M8580 Other specified disorders of bone density and structure, unspecified site: Secondary | ICD-10-CM | POA: Diagnosis not present

## 2017-03-16 DIAGNOSIS — I890 Lymphedema, not elsewhere classified: Secondary | ICD-10-CM | POA: Diagnosis not present

## 2017-03-16 DIAGNOSIS — Z79899 Other long term (current) drug therapy: Secondary | ICD-10-CM

## 2017-03-16 LAB — COMPREHENSIVE METABOLIC PANEL
ALT: 15 U/L (ref 14–54)
AST: 18 U/L (ref 15–41)
Albumin: 3.6 g/dL (ref 3.5–5.0)
Alkaline Phosphatase: 53 U/L (ref 38–126)
Anion gap: 9 (ref 5–15)
BUN: 14 mg/dL (ref 6–20)
CO2: 29 mmol/L (ref 22–32)
Calcium: 9.8 mg/dL (ref 8.9–10.3)
Chloride: 100 mmol/L — ABNORMAL LOW (ref 101–111)
Creatinine, Ser: 0.77 mg/dL (ref 0.44–1.00)
GFR calc Af Amer: >60 mL/min (ref >60)
GFR calc non Af Amer: >60 mL/min (ref >60)
Glucose, Bld: 115 mg/dL — ABNORMAL HIGH (ref 65–99)
Potassium: 3.9 mmol/L (ref 3.5–5.1)
Sodium: 138 mmol/L (ref 135–145)
Total Bilirubin: 0.2 mg/dL — ABNORMAL LOW (ref 0.3–1.2)
Total Protein: 7.7 g/dL (ref 6.5–8.1)

## 2017-03-16 LAB — CBC WITH DIFFERENTIAL/PLATELET
Basophils Absolute: 0 10*3/uL (ref 0.0–0.1)
Basophils Relative: 0 %
Eosinophils Absolute: 0.1 10*3/uL (ref 0.0–0.7)
Eosinophils Relative: 1 %
HCT: 39.4 % (ref 36.0–46.0)
Hemoglobin: 12.2 g/dL (ref 12.0–15.0)
Lymphocytes Relative: 35 %
Lymphs Abs: 3 10*3/uL (ref 0.7–4.0)
MCH: 25.7 pg — ABNORMAL LOW (ref 26.0–34.0)
MCHC: 31 g/dL (ref 30.0–36.0)
MCV: 82.9 fL (ref 78.0–100.0)
Monocytes Absolute: 0.6 10*3/uL (ref 0.1–1.0)
Monocytes Relative: 8 %
Neutro Abs: 4.8 10*3/uL (ref 1.7–7.7)
Neutrophils Relative %: 56 %
Platelets: 287 10*3/uL (ref 150–400)
RBC: 4.75 MIL/uL (ref 3.87–5.11)
RDW: 13.1 % (ref 11.5–15.5)
WBC: 8.5 10*3/uL (ref 4.0–10.5)

## 2017-03-16 MED ORDER — DENOSUMAB 60 MG/ML ~~LOC~~ SOLN
60.0000 mg | Freq: Once | SUBCUTANEOUS | Status: AC
  Administered 2017-03-16: 60 mg via SUBCUTANEOUS
  Filled 2017-03-16: qty 1

## 2017-03-16 NOTE — Patient Instructions (Signed)
[place] at [facility] Discharge Instructions  RECOMMENDATIONS MADE BY THE CONSULTANT AND ANY TEST RESULTS WILL BE SENT TO YOUR REFERRING PHYSICIAN.  Prolia given today  Follow up as scheduled.  Thank you for choosing [place] at [facility] to provide your oncology and hematology care.  To afford each patient quality time with our provider, please arrive at least 15 minutes before your scheduled appointment time.    If you have a lab appointment with the [facility] please come in thru the  Main Entrance and check in at the main information desk  You need to re-schedule your appointment should you arrive 10 or more minutes late.  We strive to give you quality time with our providers, and arriving late affects you and other patients whose appointments are after yours.  Also, if you no show three or more times for appointments you may be dismissed from the clinic at the providers discretion.     Again, thank you for choosing [facility].  Our hope is that these requests will decrease the amount of time that you wait before being seen by our physicians.       _____________________________________________________________  Should you have questions after your visit to [facility], please contact our office at [phone] between the hours of 8:30 a.m. and 4:30 p.m.  Voicemails left after 4:30 p.m. will not be returned until the following business day.  For prescription refill requests, have your pharmacy contact our office.       Resources For Cancer Patients and their Caregivers ? American Cancer Society: Can assist with transportation, wigs, general needs, runs Look Good Feel Better.        774-558-2077 ? Cancer Care: Provides financial assistance, online support groups, medication/co-pay assistance.  1-800-813-HOPE 431-766-0424) ? [employer] Assists [place] cancer patients and  their families through emotional , educational and financial support.  580-818-8947 ? [place] DSS Where to apply for food stamps, Medicaid and utility assistance. 714-610-3119 ? RCATS: Transportation to medical appointments. (778)176-9569 ? Social Security Administration: May apply for disability if have a Stage IV cancer. 7634194061 503-603-1277 ? [employer], Disability and Transit Services: Assists with nutrition, care and transit needs. [place] Support Programs: @10RELATIVEDAYS @ > Cancer Support Group  2nd Tuesday of the month 1pm-2pm, Journey Room  > Creative Journey  3rd Tuesday of the month 1130am-1pm, Journey Room  > Look Good Feel Better  1st Wednesday of the month 10am-12 noon, Journey Room (Call [facility] to register [phone])

## 2017-03-16 NOTE — Progress Notes (Signed)
Krista Doyle presents today for injection per MD orders. Proia 60 mg given per orders. See [facility] without incident. Patient tolerated well.

## 2017-03-24 ENCOUNTER — Encounter: Payer: BLUE CROSS/BLUE SHIELD | Attending: Physical Medicine & Rehabilitation | Admitting: Registered Nurse

## 2017-03-24 ENCOUNTER — Encounter: Payer: Self-pay | Admitting: Registered Nurse

## 2017-03-24 VITALS — BP 127/85 | HR 73

## 2017-03-24 DIAGNOSIS — G620 Drug-induced polyneuropathy: Secondary | ICD-10-CM

## 2017-03-24 DIAGNOSIS — M47816 Spondylosis without myelopathy or radiculopathy, lumbar region: Secondary | ICD-10-CM | POA: Diagnosis not present

## 2017-03-24 DIAGNOSIS — M545 Low back pain: Secondary | ICD-10-CM | POA: Diagnosis not present

## 2017-03-24 DIAGNOSIS — G8929 Other chronic pain: Secondary | ICD-10-CM

## 2017-03-24 DIAGNOSIS — M25561 Pain in right knee: Secondary | ICD-10-CM | POA: Diagnosis not present

## 2017-03-24 DIAGNOSIS — Z79899 Other long term (current) drug therapy: Secondary | ICD-10-CM | POA: Diagnosis not present

## 2017-03-24 DIAGNOSIS — G894 Chronic pain syndrome: Secondary | ICD-10-CM

## 2017-03-24 DIAGNOSIS — Z853 Personal history of malignant neoplasm of breast: Secondary | ICD-10-CM | POA: Diagnosis not present

## 2017-03-24 DIAGNOSIS — Z9221 Personal history of antineoplastic chemotherapy: Secondary | ICD-10-CM | POA: Diagnosis not present

## 2017-03-24 DIAGNOSIS — I890 Lymphedema, not elsewhere classified: Secondary | ICD-10-CM | POA: Insufficient documentation

## 2017-03-24 DIAGNOSIS — T451X5A Adverse effect of antineoplastic and immunosuppressive drugs, initial encounter: Secondary | ICD-10-CM | POA: Diagnosis not present

## 2017-03-24 DIAGNOSIS — Z5181 Encounter for therapeutic drug level monitoring: Secondary | ICD-10-CM | POA: Diagnosis not present

## 2017-03-24 DIAGNOSIS — M5416 Radiculopathy, lumbar region: Secondary | ICD-10-CM | POA: Diagnosis not present

## 2017-03-24 DIAGNOSIS — M546 Pain in thoracic spine: Secondary | ICD-10-CM

## 2017-03-24 MED ORDER — FENTANYL 12 MCG/HR TD PT72: 12.5000 ug | MEDICATED_PATCH | TRANSDERMAL | 0 refills | Status: DC

## 2017-03-24 MED ORDER — OXYCODONE-ACETAMINOPHEN 10-325 MG PO TABS: 1.0000 | ORAL_TABLET | Freq: Three times a day (TID) | ORAL | 0 refills | Status: DC | PRN

## 2017-03-24 NOTE — Progress Notes (Signed)
Subjective:    Patient ID: Krista Doyle, female    DOB: December 11, 1960, 57 y.o.   MRN: [id number]  HPI: Krista Doyle is a 57year old female who returns for follow up appointmentfor chronic pain and medication refill. She states her pain is located in her lower back radiating into his right hip and  right lower extremity. Also reports bilateral hip pain L>R and right knee pain. She rates her pain 6.  Her current exercise regime is walking and performing stretching exercises.   Krista Doyle equivalent is 72.72 MME.    Last UDS was performed on 02/24/2017, it was consistent.  Pain Inventory Average Pain 7 Pain Right Now 6 My pain is sharp, stabbing and tingling  In the last 24 hours, has pain interfered with the following? General activity 6 Relation with others 6 Enjoyment of life 6 What TIME of day is your pain at its worst? evening and night, night  Sleep (in general) NA  Pain is worse with: walking, bending, sitting and standing Pain improves with: rest, medication and TENS Relief from Meds: 7  Mobility walk with assistance use a cane use a walker ability to climb steps?  yes do you drive?  yes Do you have any goals in this area?  yes  Function disabled: date disabled 11/2006 I need assistance with the following:  household duties Do you have any goals in this area?  yes  Neuro/Psych weakness numbness tingling trouble walking spasms  Prior Studies Any changes since last visit?  yes bone scan  Physicians involved in your care Any changes since last visit?  no   Family History  Problem Relation Age of Onset  . Colon cancer Neg Hx    Social History   Socioeconomic History  . Marital status: Married    Spouse name: Not on file  . Number of children: Not on file  . Years of education: Not on file  . Highest education level: Not on file  Social Needs  . Financial resource strain: Not on file  . Food insecurity -  worry: Not on file  . Food insecurity - inability: Not on file  . Transportation needs - medical: Not on file  . Transportation needs - non-medical: Not on file  Occupational History  . Occupation: Disabled  Tobacco Use  . Smoking status: Never Smoker  . Smokeless tobacco: Never Used  Substance and Sexual Activity  . Alcohol use: No    Alcohol/week: 0.0 oz  . Drug use: No  . Sexual activity: Yes  Other Topics Concern  . Not on file  Social History Narrative  . Not on file   Past Surgical History:  Procedure Laterality Date  . BACK SURGERY    . BREAST LUMPECTOMY    . COLONOSCOPY     about 3 yrs ago in [place]  . KNEE SURGERY     right  . TONSILLECTOMY     Past Medical History:  Diagnosis Date  . Asthma   . Back pain   . Breast cancer ([place]) 2008   left  . Diabetes ([place])   . Hypercholesterolemia   . 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 Personal history of chemotherapy   . Personal history of radiation therapy 2008   There were no vitals taken for this visit.  Opioid Risk Score:  0 Fall Risk Score:  `1  Depression screen PHQ 2/9  Depression screen [facility] 2/9 01/13/2017 05/27/2016 03/28/2016 10/07/2015  08/31/2015 07/06/2015 04/28/2015  Decreased Interest 0 0 0 0 1 0 1  Down, Depressed, Hopeless 0 0 0 0 0 0 0  PHQ - 2 Score 0 0 0 0 1 0 1  Altered sleeping - - - - - - -  Tired, decreased energy - - - - - - -  Change in appetite - - - - - - -  Feeling bad or failure about yourself  - - - - - - -  Trouble concentrating - - - - - - -  Moving slowly or fidgety/restless - - - - - - -  Suicidal thoughts - - - - - - -  PHQ-9 Score - - - - - - -  Difficult doing work/chores - - - - - - -    Review of Systems  Constitutional: Positive for unexpected weight change.  HENT: Negative.   Eyes: Negative.   Respiratory: Negative.   Cardiovascular: Negative.   Gastrointestinal: Positive for constipation.  Endocrine:       High blood sugar   Genitourinary: Negative.     Musculoskeletal: Positive for gait problem and joint swelling.       Spasms  Skin: Negative.   Allergic/Immunologic: Negative.   Neurological: Positive for weakness and numbness.       Tingling Spasms  Hematological: Negative.   Psychiatric/Behavioral: Negative.   All other systems reviewed and are negative.      Objective:   Physical Exam  Constitutional: She is oriented to person, place, and time. She appears well-developed and well-nourished.  HENT:  Head: Normocephalic and atraumatic.  Neck: Normal range of motion. Neck supple.  Cardiovascular: Normal rate and regular rhythm.  Pulmonary/Chest: Effort normal and breath sounds normal.  Musculoskeletal:  Normal Muscle Bulk and Muscle Testing Reveals: Upper Extremities: Right:  Full ROM and Muscle Strength 5/5  Left: Decreased ROM 45 Degrees and Muscle Strength 4/5 Thoracic Hypersensitivity: T-3-T-7 Lumbar Paraspinal Tenderness: L-3-L-5 Lower Extremities: Right: Decreased ROM and Muscle Strength 5/5 Right Lower Extremity Flexion Produces Pain into Extremity Left: Full ROM and Muscle Strength 5/5 Bilateral Greater Trochanter Tenderness Arises from Table Slowly using Straight Cane for Support Narrow Based gait  Neurological: She is alert and oriented to person, place, and time.  Skin: Skin is warm and dry.  Psychiatric: She has a normal mood and affect.  Nursing note and vitals reviewed.         Assessment & Plan:  1. Chronic low back pain, Lumbar Facet Arthropathy,lumbar spondylosis, spondylolisthesis, with lumbar radiculopathy.03/24/2017 Refilled: Fentanyl 12 mcg one patch every three days #10 and Oxycodone 10/325 mg one tablet every 8 hours as needed for pain #90.  We will continue the opioid monitoring program, this consists of regular clinic visits, examinations, urine drug screen, pill counts as well as use of [place] Controlled Substance reporting System. 2. Chemotherapy induced peripheral  neuropathy:Continue Lyrica at 150mg  TID. 03/24/2017 3. Chronic Midline Thoracic Pain: Continue HEP and Current Medication Regime. 03/24/2017 4. Muscle Spasm: Continue Flexeril.03/24/2017 5. Right Knee Pain: Continue Current Medication Regime: Continue HEP as Tolerated. Continue to Monitor.   20 minutes of face to face patient care time was spent during this visit. All questions were encouraged and answered.  F/U in 1 month

## 2017-04-07 DIAGNOSIS — I10 Essential (primary) hypertension: Secondary | ICD-10-CM | POA: Diagnosis not present

## 2017-04-07 DIAGNOSIS — J4520 Mild intermittent asthma, uncomplicated: Secondary | ICD-10-CM | POA: Diagnosis not present

## 2017-04-07 DIAGNOSIS — Z0001 Encounter for general adult medical examination with abnormal findings: Secondary | ICD-10-CM | POA: Diagnosis not present

## 2017-04-07 DIAGNOSIS — E119 Type 2 diabetes mellitus without complications: Secondary | ICD-10-CM | POA: Diagnosis not present

## 2017-04-10 DIAGNOSIS — G629 Polyneuropathy, unspecified: Secondary | ICD-10-CM | POA: Diagnosis not present

## 2017-04-10 DIAGNOSIS — E119 Type 2 diabetes mellitus without complications: Secondary | ICD-10-CM | POA: Diagnosis not present

## 2017-04-10 DIAGNOSIS — I10 Essential (primary) hypertension: Secondary | ICD-10-CM | POA: Diagnosis not present

## 2017-04-10 DIAGNOSIS — J4520 Mild intermittent asthma, uncomplicated: Secondary | ICD-10-CM | POA: Diagnosis not present

## 2017-04-21 ENCOUNTER — Encounter: Payer: BLUE CROSS/BLUE SHIELD | Attending: Physical Medicine & Rehabilitation | Admitting: Registered Nurse

## 2017-04-21 ENCOUNTER — Encounter: Payer: Self-pay | Admitting: Registered Nurse

## 2017-04-21 ENCOUNTER — Other Ambulatory Visit: Payer: Self-pay

## 2017-04-21 VITALS — BP 120/81 | HR 96

## 2017-04-21 DIAGNOSIS — Z5181 Encounter for therapeutic drug level monitoring: Secondary | ICD-10-CM | POA: Diagnosis not present

## 2017-04-21 DIAGNOSIS — Z9221 Personal history of antineoplastic chemotherapy: Secondary | ICD-10-CM | POA: Insufficient documentation

## 2017-04-21 DIAGNOSIS — G620 Drug-induced polyneuropathy: Secondary | ICD-10-CM | POA: Diagnosis not present

## 2017-04-21 DIAGNOSIS — Z79899 Other long term (current) drug therapy: Secondary | ICD-10-CM | POA: Diagnosis not present

## 2017-04-21 DIAGNOSIS — T451X5A Adverse effect of antineoplastic and immunosuppressive drugs, initial encounter: Secondary | ICD-10-CM | POA: Diagnosis not present

## 2017-04-21 DIAGNOSIS — I890 Lymphedema, not elsewhere classified: Secondary | ICD-10-CM | POA: Diagnosis not present

## 2017-04-21 DIAGNOSIS — G8929 Other chronic pain: Secondary | ICD-10-CM | POA: Diagnosis not present

## 2017-04-21 DIAGNOSIS — Z853 Personal history of malignant neoplasm of breast: Secondary | ICD-10-CM | POA: Diagnosis not present

## 2017-04-21 DIAGNOSIS — M25561 Pain in right knee: Secondary | ICD-10-CM

## 2017-04-21 DIAGNOSIS — M5416 Radiculopathy, lumbar region: Secondary | ICD-10-CM | POA: Diagnosis not present

## 2017-04-21 DIAGNOSIS — G894 Chronic pain syndrome: Secondary | ICD-10-CM

## 2017-04-21 DIAGNOSIS — M47816 Spondylosis without myelopathy or radiculopathy, lumbar region: Secondary | ICD-10-CM | POA: Insufficient documentation

## 2017-04-21 DIAGNOSIS — M545 Low back pain: Secondary | ICD-10-CM | POA: Insufficient documentation

## 2017-04-21 DIAGNOSIS — M546 Pain in thoracic spine: Secondary | ICD-10-CM | POA: Diagnosis not present

## 2017-04-21 MED ORDER — CYCLOBENZAPRINE HCL 10 MG PO TABS: 10.0000 mg | ORAL_TABLET | Freq: Two times a day (BID) | ORAL | 3 refills | Status: DC

## 2017-04-21 MED ORDER — OXYCODONE-ACETAMINOPHEN 10-325 MG PO TABS: 1.0000 | ORAL_TABLET | Freq: Three times a day (TID) | ORAL | 0 refills | Status: DC | PRN

## 2017-04-21 MED ORDER — FENTANYL 12 MCG/HR TD PT72: 12.5000 ug | MEDICATED_PATCH | TRANSDERMAL | 0 refills | Status: DC

## 2017-04-21 NOTE — Progress Notes (Signed)
Subjective:    Patient ID: [name], female    DOB: 16-Sep-1960, 57 y.o.   MRN: [id number]  HPI: Ms. Kamil- Ilah Boule is a 57year old female who returns for follow up appointmentfor chronic pain and medication refill. She states her pain is located in her lower back radiating into her right lower extremity, also reports right hip pain. She rates her pain 6. Her current exercise regime is walking and performing stretching exercises.   Ms. Cedar Morphine equivalent is 76.38 MME.    Last UDS was performed on 02/24/2017, it was consistent.  Pain Inventory Average Pain 7 Pain Right Now 6 My pain is sharp, stabbing and tingling  In the last 24 hours, has pain interfered with the following? General activity 6 Relation with others 6 Enjoyment of life 6 What TIME of day is your pain at its worst? evening and night, night  Sleep (in general) NA  Pain is worse with: walking, bending, sitting and standing Pain improves with: rest, medication and TENS Relief from Meds: 5  Mobility walk with assistance use a cane use a walker ability to climb steps?  yes do you drive?  yes Do you have any goals in this area?  yes  Function disabled: date disabled 11/2006 I need assistance with the following:  household duties Do you have any goals in this area?  yes  Neuro/Psych weakness numbness tingling trouble walking spasms  Prior Studies Any changes since last visit?  yes bone scan  Physicians involved in your care Any changes since last visit?  no   Family History  Problem Relation Age of Onset  . Colon cancer Neg Hx    Social History   Socioeconomic History  . Marital status: Married    Spouse name: Not on file  . Number of children: Not on file  . Years of education: Not on file  . Highest education level: Not on file  Occupational History  . Occupation: Disabled  Social Needs  . Financial resource strain: Not on file  . Food insecurity:      Worry: Not on file    Inability: Not on file  . Transportation needs:    Medical: Not on file    Non-medical: Not on file  Tobacco Use  . Smoking status: Never Smoker  . Smokeless tobacco: Never Used  Substance and Sexual Activity  . Alcohol use: No    Alcohol/week: 0.0 oz  . Drug use: No  . Sexual activity: Yes  Lifestyle  . Physical activity:    Days per week: Not on file    Minutes per session: Not on file  . Stress: Not on file  Relationships  . Social connections:    Talks on phone: Not on file    Gets together: Not on file    Attends religious service: Not on file    Active member of club or organization: Not on file    Attends meetings of clubs or organizations: Not on file    Relationship status: Not on file  Other Topics Concern  . Not on file  Social History Narrative  . Not on file   Past Surgical History:  Procedure Laterality Date  . BACK SURGERY    . BREAST LUMPECTOMY    . COLONOSCOPY     about 3 yrs ago in [place]  . KNEE SURGERY     right  . TONSILLECTOMY     Past Medical History:  Diagnosis Date  .  Asthma   . Back pain   . Breast cancer ([place]) 2008   left  . Diabetes ([place])   . Hypercholesterolemia   . 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 Personal history of chemotherapy   . Personal history of radiation therapy 2008   BP 120/81   Pulse 96   SpO2 97%   Opioid Risk Score:  0 Fall Risk Score:  `1  Depression screen PHQ 2/9  Depression screen [facility] 2/9 01/13/2017 05/27/2016 03/28/2016 10/07/2015 08/31/2015 07/06/2015 04/28/2015  Decreased Interest 0 0 0 0 1 0 1  Down, Depressed, Hopeless 0 0 0 0 0 0 0  PHQ - 2 Score 0 0 0 0 1 0 1  Altered sleeping - - - - - - -  Tired, decreased energy - - - - - - -  Change in appetite - - - - - - -  Feeling bad or failure about yourself  - - - - - - -  Trouble concentrating - - - - - - -  Moving slowly or fidgety/restless - - - - - - -  Suicidal thoughts - - - - - - -  PHQ-9 Score - - - - - - -   Difficult doing work/chores - - - - - - -    Review of Systems  Constitutional: Positive for unexpected weight change.  HENT: Negative.   Eyes: Negative.   Respiratory: Negative.   Cardiovascular: Negative.   Gastrointestinal: Positive for constipation.  Endocrine:       High blood sugar   Genitourinary: Negative.   Musculoskeletal: Positive for gait problem and joint swelling.       Spasms  Skin: Negative.   Allergic/Immunologic: Negative.   Neurological: Positive for weakness and numbness.       Tingling Spasms  Hematological: Negative.   Psychiatric/Behavioral: Negative.   All other systems reviewed and are negative.      Objective:   Physical Exam  Constitutional: She is oriented to person, place, and time. She appears well-developed and well-nourished.  HENT:  Head: Normocephalic and atraumatic.  Neck: Normal range of motion. Neck supple.  Cervical Paraspinal Tenderness: C-5-C-6  Cardiovascular: Normal rate and regular rhythm.  Pulmonary/Chest: Effort normal and breath sounds normal.  Musculoskeletal:  Normal Muscle Bulk and Muscle Testing Reveals: Upper Extremities: Right: Full ROM and Muscle Strength 5/5 Left: Decreased ROM 35 Degrees and Muscle Strength 4/5 Bilateral AC Joint Tenderness Thoracic Paraspinal Tenderness: T-1-T-3 Mainly Right Side Lumbar Hypersensitivity Lower Extremities: Right: Decreased ROM and Muscle Strength 5/5 Right Lower Extremity Flexion Produces Pain into Lower Extremity Left: Full ROM and Muscle Strength 5/5 Bilateral Greater Trochanter Tenderness Arises from Table Slowly using Straight Cane for Support Narrow Based gait  Neurological: She is alert and oriented to person, place, and time.  Skin: Skin is warm and dry.  Psychiatric: She has a normal mood and affect.  Nursing note and vitals reviewed.         Assessment & Plan:  1. Chronic low back pain, Lumbar Facet Arthropathy,lumbar spondylosis, spondylolisthesis, with  lumbar radiculopathy.04/21/2017 Refilled: Fentanyl 12 mcg one patch every three days #10 and Oxycodone 10/325 mg one tablet every 8 hours as needed for pain #90.  We will continue the opioid monitoring program, this consists of regular clinic visits, examinations, urine drug screen, pill counts as well as use of [place] Controlled Substance reporting System. 2. Chemotherapy induced peripheral neuropathy:Continue Lyrica at 200 mg BID. 04/21/2017 3. Chronic Midline  Thoracic Pain: Continue HEP and Current Medication Regime. 04/21/2017 4. Muscle Spasm: Continue Flexeril. 04/21/2017 5. Right Knee Pain: No complaints today. Continue Current Medication Regime: Continue HEP as Tolerated. Continue to Monitor. 04/21/2017  20 minutes of face to face patient care time was spent during this visit. All questions were encouraged and answered.  F/U in 1 month

## 2017-04-24 ENCOUNTER — Ambulatory Visit (HOSPITAL_COMMUNITY): Payer: BLUE CROSS/BLUE SHIELD | Admitting: Internal Medicine

## 2017-05-03 ENCOUNTER — Encounter (HOSPITAL_COMMUNITY): Payer: Self-pay | Admitting: Internal Medicine

## 2017-05-03 ENCOUNTER — Other Ambulatory Visit: Payer: Self-pay

## 2017-05-03 ENCOUNTER — Inpatient Hospital Stay (HOSPITAL_COMMUNITY): Payer: BLUE CROSS/BLUE SHIELD | Attending: Internal Medicine | Admitting: Internal Medicine

## 2017-05-03 VITALS — BP 134/83 | HR 98 | Temp 98.4°F | Resp 16 | Wt 142.0 lb

## 2017-05-03 DIAGNOSIS — Z853 Personal history of malignant neoplasm of breast: Secondary | ICD-10-CM | POA: Insufficient documentation

## 2017-05-03 DIAGNOSIS — M8580 Other specified disorders of bone density and structure, unspecified site: Secondary | ICD-10-CM

## 2017-05-03 DIAGNOSIS — Z9223 Personal history of estrogen therapy: Secondary | ICD-10-CM | POA: Diagnosis not present

## 2017-05-03 DIAGNOSIS — Z170 Estrogen receptor positive status [ER+]: Secondary | ICD-10-CM | POA: Diagnosis not present

## 2017-05-03 DIAGNOSIS — E559 Vitamin D deficiency, unspecified: Secondary | ICD-10-CM

## 2017-05-03 DIAGNOSIS — D241 Benign neoplasm of right breast: Secondary | ICD-10-CM | POA: Insufficient documentation

## 2017-05-03 DIAGNOSIS — Z9221 Personal history of antineoplastic chemotherapy: Secondary | ICD-10-CM

## 2017-05-03 DIAGNOSIS — C50412 Malignant neoplasm of upper-outer quadrant of left female breast: Secondary | ICD-10-CM

## 2017-05-03 DIAGNOSIS — I890 Lymphedema, not elsewhere classified: Secondary | ICD-10-CM | POA: Diagnosis not present

## 2017-05-03 NOTE — Patient Instructions (Signed)
[facility] at [facility] Discharge Instructions  Seen by Dr. Higgs today Follow up as scheduled.  Thank you for choosing [facility] at [facility] to provide your oncology and hematology care.  To afford each patient quality time with our provider, please arrive at least 15 minutes before your scheduled appointment time.   If you have a lab appointment with the Cancer Center please come in thru the  Main Entrance and check in at the main information desk  You need to re-schedule your appointment should you arrive 10 or more minutes late.  We strive to give you quality time with our providers, and arriving late affects you and other patients whose appointments are after yours.  Also, if you no show three or more times for appointments you may be dismissed from the clinic at the providers discretion.     Again, thank you for choosing [facility].  Our hope is that these requests will decrease the amount of time that you wait before being seen by our physicians.       _____________________________________________________________  Should you have questions after your visit to  Cancer Center, please contact our office at [phone] between the hours of 8:30 a.m. and 4:30 p.m.  Voicemails left after 4:30 p.m. will not be returned until the following business day.  For prescription refill requests, have your pharmacy contact our office.       Resources For Cancer Patients and their Caregivers ? American Cancer Society: Can assist with transportation, wigs, general needs, runs Look Good Feel Better.        1-888-227-6333 ? Cancer Care: Provides financial assistance, online support groups, medication/co-pay assistance.  1-800-813-HOPE (4673) ? [facility] Assists [place] cancer patients and their families through emotional , educational and financial support.  336-427-4357 ? [place]  DSS Where to apply for food stamps, Medicaid and utility assistance. 336-342-1394 ? RCATS: Transportation to medical appointments. 336-347-2287 ? Social Security Administration: May apply for disability if have a Stage IV cancer. 336-342-7796 1-800-772-1213 ? [place] [employer], Disability and Transit Services: Assists with nutrition, care and transit needs. 336-349-2343  Cancer Center Support Programs:   > Cancer Support Group  2nd Tuesday of the month 1pm-2pm, Journey Room   > Creative Journey  3rd Tuesday of the month 1130am-1pm, Journey Room    

## 2017-05-03 NOTE — Progress Notes (Signed)
Diagnosis Malignant neoplasm of upper-outer quadrant of left breast in female, estrogen receptor positive ([place]) - Plan: NM Bone Scan Whole Body  Staging Cancer Staging No matching staging information was found for the patient.  Assessment and Plan: 1.   Left Breast Cancer  ER+, PR+, HER-2+. Completed one year of Herceptin.  Original breast cancer was diagnosed in 2008.  She took tamoxifen for 5 years.  She was previously followed by Dr. Talbert Cage and was planned to complete 5 years of Femara in 2018.  Patient reports she remains on Femara therapy.  Bilateral screening mammogram that was done on 03/08/2017 was negative.  She is recommended for repeat bilateral screening mammogram in February 2020.  This will be set up on her return to clinic in August, 2019.  She is complaining of some joint discomfort primarily in her back.  She will be set up for bone scan evaluation and will be notified of those results.  She is tentatively set to follow-up in August 2019.  2.  Bone pain.  She is reporting primarily back pain.  She is set up for bone scan for further evaluation.  She will be notified of those results.  Pending bone scan results, will recommend D/C of Femara.    3.  Osteopenia.  She had a recent bone density done on 03/08/2017 that showed osteopenia.  She is being treated with Prolia every 6 months and is due for her next dose in August 2019 and will follow-up at that time.  Continue Ca++ and vitamin D.   4.  Right breast fibroadenoma.  This would be considered a benign breast lesion.  Right breast screening mammogram that was done on 03/08/2017 was negative for abnormalities in the right breast.  5.  Health maintenance.  Continue GI follow-up as recommended.   Current Status: Patient is here today for follow-up.  She is here to go over her recent mammogram and bone density.  She is reporting some back pain.  When questioned she indicates she remains on Femara.   Problem List Patient Active Problem  List   Diagnosis Date Noted  . Lumbar facet arthropathy [M47.816] 10/07/2015  . 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 Constipation due to opioid therapy [K59.03, T40.2X5A] 05/08/2015  . Genetic testing [Z13.79] 03/17/2015  . Osteopenia determined by x-ray [M85.80] 03/13/2015  . Lumbar spondylosis [M47.816] 02/11/2015  . Lumbar radiculopathy [M54.16] 02/11/2015  . Chemotherapy-induced peripheral neuropathy ([place]) [G62.0, T45.1X5A] 02/11/2015  . Vitamin D deficiency [E55.9] 02/02/2015  . Breast cancer of upper-outer quadrant of left female breast ([place]) [C50.412] 12/22/2014  . High risk medication use [Z79.899] 12/22/2014    Past Medical History Past Medical History:  Diagnosis Date  . Asthma   . Back pain   . Breast cancer ([place]) 2008   left  . Diabetes ([place])   . Hypercholesterolemia   . 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 Personal history of chemotherapy   . Personal history of radiation therapy 2008    Past Surgical History Past Surgical History:  Procedure Laterality Date  . BACK SURGERY    . BREAST LUMPECTOMY    . COLONOSCOPY     about 3 yrs ago in [place]  . KNEE SURGERY     right  . TONSILLECTOMY      Family History Family History  Problem Relation Age of Onset  . Colon cancer Neg Hx      Social History  reports that she has never smoked. She  has never used smokeless tobacco. She reports that she does not drink alcohol or use drugs.  Medications  Current Outpatient Medications:  .  albuterol (PROVENTIL HFA;VENTOLIN HFA) 108 (90 Base) MCG/ACT inhaler, Inhale 1 puff into the lungs every 6 (six) hours as needed for wheezing or shortness of breath., Disp: , Rfl:  .  atorvastatin (LIPITOR) 20 MG tablet, Take 20 mg by mouth daily., Disp: , Rfl:  .  budesonide-formoterol (SYMBICORT) 160-4.5 MCG/ACT inhaler, Inhale 2 puffs into the lungs 2 (two) times daily., Disp: , Rfl:  .  calcium carbonate (OS-CAL - DOSED IN MG OF ELEMENTAL CALCIUM) 1250 (500 Ca)  MG tablet, Take 1 tablet by mouth daily with breakfast., Disp: , Rfl:  .  celecoxib (CELEBREX) 100 MG capsule, Take 1 capsule (100 mg total) by mouth 2 (two) times daily., Disp: 30 capsule, Rfl: 1 .  cyclobenzaprine (FLEXERIL) 10 MG tablet, Take 1 tablet (10 mg total) by mouth 2 (two) times daily., Disp: 30 tablet, Rfl: 3 .  ergocalciferol (VITAMIN D2) 50000 units capsule, Take 50,000 Units by mouth once a week., Disp: , Rfl:  .  fentaNYL (DURAGESIC - DOSED MCG/HR) 12 MCG/HR, Place 1 patch (12.5 mcg total) onto the skin every 3 (three) days., Disp: 10 patch, Rfl: 0 .  insulin glargine (LANTUS) 100 UNIT/ML injection, Inject 24 Units into the skin at bedtime., Disp: , Rfl:  .  letrozole (FEMARA) 2.5 MG tablet, Take 2.5 mg by mouth daily., Disp: , Rfl:  .  loratadine (CLARITIN) 10 MG tablet, Take 10 mg by mouth daily as needed for allergies., Disp: , Rfl:  .  losartan (COZAAR) 100 MG tablet, Take 100 mg daily by mouth., Disp: , Rfl:  .  Multiple Vitamins-Minerals (ICAPS AREDS 2) CAPS, Take by mouth., Disp: , Rfl:  .  naloxegol oxalate (MOVANTIK) 25 MG TABS tablet, Take 1 tablet (25 mg total) by mouth daily., Disp: 30 tablet, Rfl: 3 .  oxyCODONE-acetaminophen (PERCOCET) 10-325 MG tablet, Take 1 tablet by mouth every 8 (eight) hours as needed for pain., Disp: 90 tablet, Rfl: 0 .  pregabalin (LYRICA) 200 MG capsule, Take 1 capsule (200 mg total) by mouth 2 (two) times daily., Disp: 60 capsule, Rfl: 5 .  sitaGLIPtin (JANUVIA) 100 MG tablet, Take 100 mg by mouth daily., Disp: , Rfl:  .  valsartan (DIOVAN) 160 MG tablet, Take 160 mg by mouth daily., Disp: , Rfl:  No current facility-administered medications for this visit.   Facility-Administered Medications Ordered in Other Visits:  .  0.9 %  sodium chloride infusion, , Intravenous, Once, [place], Kelby Fam, MD  Allergies Patient has no known allergies.  Review of Systems Review of Systems - Oncology ROS as per HPI otherwise 12 point ROS is negative  except back pain.     Physical Exam  Vitals Wt Readings from Last 3 Encounters:  05/03/17 142 lb (64.4 kg)  10/24/16 150 lb (68 kg)  03/07/16 148 lb (67.1 kg)   Temp Readings from Last 3 Encounters:  05/03/17 98.4 F (36.9 C)  03/16/17 98.7 F (37.1 C) (Oral)  09/13/16 98.5 F (36.9 C) (Oral)   BP Readings from Last 3 Encounters:  05/03/17 134/83  04/21/17 120/81  03/24/17 127/85   Pulse Readings from Last 3 Encounters:  05/03/17 98  04/21/17 96  03/24/17 73   Constitutional: Well-developed, well-nourished, and in no distress.   HENT: Head: Normocephalic and atraumatic.  Mouth/Throat: No oropharyngeal exudate. Mucosa moist. Eyes: Pupils are equal, round, and reactive  to light. Conjunctivae are normal. No scleral icterus.  Neck: Normal range of motion. Neck supple. No JVD present.  Cardiovascular: Normal rate, regular rhythm and normal heart sounds.  Exam reveals no gallop and no friction rub.   No murmur heard. Pulmonary/Chest: Effort normal and breath sounds normal. No respiratory distress. No wheezes.No rales.  Abdominal: Soft. Bowel sounds are normal. No distension. There is no tenderness. There is no guarding.  Musculoskeletal: No edema or tenderness.  Lymphadenopathy: No cervical, axillary or supraclavicular adenopathy.  Neurological: Alert and oriented to person, place, and time. No cranial nerve deficit.  Skin: Skin is warm and dry. No rash noted. No erythema. No pallor.  Psychiatric: Affect and judgment normal.  Bilateral breast exam: Chaperone present.  Left breast with well-healed surgical scar in the left upper outer quadrant, no masses, nipple discharge noted.  Right breast shows no dominant masses.    Labs No visits with results within 3 Day(s) from this visit.  Latest known visit with results is:  Appointment on 03/16/2017  Component Date Value Ref Range Status  . Sodium 03/16/2017 138  135 - 145 mmol/L Final  . Potassium 03/16/2017 3.9  3.5 - 5.1  mmol/L Final  . Chloride 03/16/2017 100* 101 - 111 mmol/L Final  . CO2 03/16/2017 29  22 - 32 mmol/L Final  . Glucose, Bld 03/16/2017 115* 65 - 99 mg/dL Final  . BUN 03/16/2017 14  6 - 20 mg/dL Final  . Creatinine, Ser 03/16/2017 0.77  0.44 - 1.00 mg/dL Final  . Calcium 03/16/2017 9.8  8.9 - 10.3 mg/dL Final  . Total Protein 03/16/2017 7.7  6.5 - 8.1 g/dL Final  . Albumin 03/16/2017 3.6  3.5 - 5.0 g/dL Final  . AST 03/16/2017 18  15 - 41 U/L Final  . ALT 03/16/2017 15  14 - 54 U/L Final  . Alkaline Phosphatase 03/16/2017 53  38 - 126 U/L Final  . Total Bilirubin 03/16/2017 0.2* 0.3 - 1.2 mg/dL Final  . GFR calc non Af Amer 03/16/2017 >60  >60 mL/min Final  . GFR calc Af Amer 03/16/2017 >60  >60 mL/min Final   Comment: (NOTE) The eGFR has been calculated using the CKD EPI equation. This calculation has not been validated in all clinical situations. eGFR's persistently <60 mL/min signify possible Chronic Kidney Disease.   Georgiann Hahn gap 03/16/2017 9  5 - 15 Final   Performed at [facility], [address] [place], [place] [address]  . WBC 03/16/2017 8.5  4.0 - 10.5 K/uL Final  . RBC 03/16/2017 4.75  3.87 - 5.11 MIL/uL Final  . Hemoglobin 03/16/2017 12.2  12.0 - 15.0 g/dL Final  . HCT 03/16/2017 39.4  36.0 - 46.0 % Final  . MCV 03/16/2017 82.9  78.0 - 100.0 fL Final  . MCH 03/16/2017 25.7* 26.0 - 34.0 pg Final  . MCHC 03/16/2017 31.0  30.0 - 36.0 g/dL Final  . RDW 03/16/2017 13.1  11.5 - 15.5 % Final  . Platelets 03/16/2017 287  150 - 400 K/uL Final  . Neutrophils Relative % 03/16/2017 56  % Final  . Neutro Abs 03/16/2017 4.8  1.7 - 7.7 K/uL Final  . Lymphocytes Relative 03/16/2017 35  % Final  . Lymphs Abs 03/16/2017 3.0  0.7 - 4.0 K/uL Final  . Monocytes Relative 03/16/2017 8  % Final  . Monocytes Absolute 03/16/2017 0.6  0.1 - 1.0 K/uL Final  . Eosinophils Relative 03/16/2017 1  % Final  . Eosinophils Absolute 03/16/2017  0.1  0.0 - 0.7 K/uL Final  . Basophils Relative 03/16/2017  0  % Final  . Basophils Absolute 03/16/2017 0.0  0.0 - 0.1 K/uL Final   Performed at [facility], [address] [place], [place] [address]     Pathology Orders Placed This Encounter  Procedures  . NM Bone Scan Whole Body    Standing Status:   Future    Standing Expiration Date:   05/03/2018    Order Specific Question:   If indicated for the ordered procedure, I authorize the administration of a radiopharmaceutical per Radiology protocol    Answer:   Yes    Order Specific Question:   Is the patient pregnant?    Answer:   No    Order Specific Question:   Preferred imaging location?    Answer:   [facility]    Order Specific Question:   Radiology Contrast Protocol - do NOT remove file path    Answer:   [email or web address]       Zoila Shutter MD

## 2017-05-10 ENCOUNTER — Encounter (HOSPITAL_COMMUNITY)
Admission: RE | Admit: 2017-05-10 | Discharge: 2017-05-10 | Disposition: A | Discharge status: Home / Self Care | Payer: BLUE CROSS/BLUE SHIELD | Source: Ambulatory Visit | Attending: Internal Medicine | Admitting: Internal Medicine

## 2017-05-10 ENCOUNTER — Encounter (HOSPITAL_COMMUNITY): Payer: Self-pay

## 2017-05-10 DIAGNOSIS — Z170 Estrogen receptor positive status [ER+]: Secondary | ICD-10-CM | POA: Insufficient documentation

## 2017-05-10 DIAGNOSIS — C50412 Malignant neoplasm of upper-outer quadrant of left female breast: Secondary | ICD-10-CM | POA: Diagnosis not present

## 2017-05-10 HISTORY — DX: Essential (primary) hypertension: I10

## 2017-05-10 MED ORDER — TECHNETIUM TC 99M MEDRONATE IV KIT
20.0000 | PACK | Freq: Once | INTRAVENOUS | Status: AC | PRN
  Administered 2017-05-10: 20 via INTRAVENOUS

## 2017-05-16 ENCOUNTER — Encounter: Payer: Self-pay | Admitting: Registered Nurse

## 2017-05-16 ENCOUNTER — Encounter: Payer: BLUE CROSS/BLUE SHIELD | Attending: Physical Medicine & Rehabilitation | Admitting: Registered Nurse

## 2017-05-16 VITALS — BP 132/88 | HR 94 | Ht 63.0 in | Wt 144.4 lb

## 2017-05-16 DIAGNOSIS — M47816 Spondylosis without myelopathy or radiculopathy, lumbar region: Secondary | ICD-10-CM | POA: Diagnosis not present

## 2017-05-16 DIAGNOSIS — G620 Drug-induced polyneuropathy: Secondary | ICD-10-CM | POA: Diagnosis not present

## 2017-05-16 DIAGNOSIS — M546 Pain in thoracic spine: Secondary | ICD-10-CM | POA: Diagnosis not present

## 2017-05-16 DIAGNOSIS — G8929 Other chronic pain: Secondary | ICD-10-CM | POA: Diagnosis not present

## 2017-05-16 DIAGNOSIS — Z9221 Personal history of antineoplastic chemotherapy: Secondary | ICD-10-CM | POA: Insufficient documentation

## 2017-05-16 DIAGNOSIS — M25561 Pain in right knee: Secondary | ICD-10-CM | POA: Diagnosis not present

## 2017-05-16 DIAGNOSIS — M545 Low back pain: Secondary | ICD-10-CM | POA: Insufficient documentation

## 2017-05-16 DIAGNOSIS — M5416 Radiculopathy, lumbar region: Secondary | ICD-10-CM | POA: Diagnosis not present

## 2017-05-16 DIAGNOSIS — I890 Lymphedema, not elsewhere classified: Secondary | ICD-10-CM | POA: Insufficient documentation

## 2017-05-16 DIAGNOSIS — Z5181 Encounter for therapeutic drug level monitoring: Secondary | ICD-10-CM

## 2017-05-16 DIAGNOSIS — Z853 Personal history of malignant neoplasm of breast: Secondary | ICD-10-CM | POA: Insufficient documentation

## 2017-05-16 DIAGNOSIS — G894 Chronic pain syndrome: Secondary | ICD-10-CM | POA: Diagnosis not present

## 2017-05-16 DIAGNOSIS — T451X5A Adverse effect of antineoplastic and immunosuppressive drugs, initial encounter: Secondary | ICD-10-CM | POA: Diagnosis not present

## 2017-05-16 DIAGNOSIS — Z79899 Other long term (current) drug therapy: Secondary | ICD-10-CM | POA: Diagnosis not present

## 2017-05-16 MED ORDER — OXYCODONE-ACETAMINOPHEN 10-325 MG PO TABS: 1.0000 | ORAL_TABLET | Freq: Three times a day (TID) | ORAL | 0 refills | Status: DC | PRN

## 2017-05-16 MED ORDER — FENTANYL 12 MCG/HR TD PT72: 12.5000 ug | MEDICATED_PATCH | TRANSDERMAL | 0 refills | Status: DC

## 2017-05-16 NOTE — Progress Notes (Signed)
Subjective:    Patient ID: Krista Doyle, female    DOB: 1960-11-22, 57 y.o.   MRN: [id number]  HPI: Krista Doyle is a 57 year old female who returns for follow up appointment for chronic pain and medication refill. She states her pain is located in her mid-lower back pain radiating into her right hip and right lower extremity. He rates his pain 6. His current exercise regime is walking and ball therapy.   Krista Doyle Morphine Equivalent is 66.54 MME.   Pain Inventory Average Pain 9 Pain Right Now 6 My pain is .  In the last 24 hours, has pain interfered with the following? General activity 5 Relation with others 6 Enjoyment of life 5 What TIME of day is your pain at its worst? evening and night Sleep (in general) Fair  Pain is worse with: walking, bending, sitting and standing Pain improves with: rest, heat/ice and medication Relief from Meds: 6  Mobility use a cane use a walker ability to climb steps?  yes do you drive?  yes  Function disabled: date disabled 2008  Neuro/Psych numbness tingling spasms  Prior Studies Any changes since last visit?  no  Physicians involved in your care Any changes since last visit?  no   Family History  Problem Relation Age of Onset  . Colon cancer Neg Hx    Social History   Socioeconomic History  . Marital status: Married    Spouse name: Not on file  . Number of children: Not on file  . Years of education: Not on file  . Highest education level: Not on file  Occupational History  . Occupation: Disabled  Social Needs  . Financial resource strain: Not on file  . Food insecurity:    Worry: Not on file    Inability: Not on file  . Transportation needs:    Medical: Not on file    Non-medical: Not on file  Tobacco Use  . Smoking status: Never Smoker  . Smokeless tobacco: Never Used  Substance and Sexual Activity  . Alcohol use: No    Alcohol/week: 0.0 oz  . Drug use: No  . Sexual activity:  Yes  Lifestyle  . Physical activity:    Days per week: Not on file    Minutes per session: Not on file  . Stress: Not on file  Relationships  . Social connections:    Talks on phone: Not on file    Gets together: Not on file    Attends religious service: Not on file    Active member of club or organization: Not on file    Attends meetings of clubs or organizations: Not on file    Relationship status: Not on file  Other Topics Concern  . Not on file  Social History Narrative  . Not on file   Past Surgical History:  Procedure Laterality Date  . BACK SURGERY    . BREAST LUMPECTOMY    . COLONOSCOPY     about 3 yrs ago in [place]  . KNEE SURGERY     right  . TONSILLECTOMY     Past Medical History:  Diagnosis Date  . Asthma   . Back pain   . Breast cancer ([place]) 2008   left  . Diabetes ([place])   . Hypercholesterolemia   . Hypertension   . 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 Personal history of chemotherapy   . Personal history of radiation therapy 2008  There were no vitals taken for this visit.  Opioid Risk Score:   Fall Risk Score:  `1  Depression screen PHQ 2/9  Depression screen [facility] 2/9 04/21/2017 01/13/2017 05/27/2016 03/28/2016 10/07/2015 08/31/2015 07/06/2015  Decreased Interest - 0 0 0 0 1 0  Down, Depressed, Hopeless - 0 0 0 0 0 0  PHQ - 2 Score - 0 0 0 0 1 0  Altered sleeping 0 - - - - - -  Tired, decreased energy 0 - - - - - -  Change in appetite - - - - - - -  Feeling bad or failure about yourself  - - - - - - -  Trouble concentrating - - - - - - -  Moving slowly or fidgety/restless - - - - - - -  Suicidal thoughts - - - - - - -  PHQ-9 Score - - - - - - -  Difficult doing work/chores - - - - - - -    Review of Systems  Constitutional: Positive for unexpected weight change.  HENT: Negative.   Eyes: Negative.   Respiratory: Negative.   Cardiovascular: Positive for leg swelling.  Gastrointestinal: Positive for constipation.  Endocrine:  Negative.   Genitourinary: Negative.   Musculoskeletal: Positive for arthralgias, back pain, gait problem, joint swelling and myalgias.  Skin: Negative.   Allergic/Immunologic: Negative.   Neurological: Positive for numbness.  Hematological: Negative.   Psychiatric/Behavioral: Negative.   All other systems reviewed and are negative.      Objective:   Physical Exam  Constitutional: She is oriented to person, place, and time. She appears well-developed and well-nourished.  HENT:  Head: Normocephalic and atraumatic.  Neck: Normal range of motion. Neck supple.  Cardiovascular: Normal rate and regular rhythm.  Pulmonary/Chest: Effort normal and breath sounds normal.  Musculoskeletal:  Normal Muscle Bulk and Muscle Testing Reveals: Upper Extremities: Right: Full ROM and Muscle Strength 4/5 Right AC Joint Tenderness Left: Decreased ROM 90 Degrees and Muscle Strength 4/5 Thoracic Hypersensitivity: T-1-T-7 Lumbar Paraspinal Tenderness: L-3-L-5 Right: Greater Trochanter Tenderness Lower Extremities: Left: Full ROM and Muscle Strength 5/5 Right: Decreased ROM and Muscle Strength 4/5 Right Lower Extremity Flexion Produces Pain into Right Lower Extremity Arises from Table Slowly using straight cane for support Antalgic Gait   Neurological: She is alert and oriented to person, place, and time.  Skin: Skin is warm and dry.  Psychiatric: She has a normal mood and affect.  Nursing note and vitals reviewed.         Assessment & Plan:  1. Chronic low back pain, Lumbar Facet Arthropathy,lumbar spondylosis, spondylolisthesis, with lumbar radiculopathy.Continue medication regimen: 05/16/2017 Refilled: Fentanyl 12 mcg one patch every three days #10 and Oxycodone 10/325 mg one tablet every 8 hours as needed for pain #90.  We will continue the opioid monitoring program, this consists of regular clinic visits, examinations, urine drug screen, pill counts as well as use of [place]  Controlled Substance reporting System. 2. Chemotherapy induced peripheral neuropathy:Continue Lyrica at 200 mg BID. 05/16/2017 3. Chronic Midline Thoracic Pain: Continue HEP and Current Medication Regime. 05/16/2017 4. Muscle Spasm: Continue medication regimen with Flexeril. 05/16/2017 5. Right Knee Pain: No complaints today. Continue Current Medication Regime: Continue HEP as Tolerated. Continue to Monitor. 05/16/2017  20 minutes of face to face patient care time was spent during this visit. All questions were encouraged and answered.  F/U in 1 month

## 2017-05-22 ENCOUNTER — Ambulatory Visit: Payer: BLUE CROSS/BLUE SHIELD | Admitting: Registered Nurse

## 2017-05-29 DIAGNOSIS — M545 Low back pain: Secondary | ICD-10-CM | POA: Diagnosis not present

## 2017-05-29 DIAGNOSIS — M542 Cervicalgia: Secondary | ICD-10-CM | POA: Diagnosis not present

## 2017-06-28 ENCOUNTER — Encounter: Payer: BLUE CROSS/BLUE SHIELD | Admitting: Physical Medicine & Rehabilitation

## 2017-07-03 ENCOUNTER — Encounter: Payer: BLUE CROSS/BLUE SHIELD | Admitting: Physical Medicine & Rehabilitation

## 2017-07-05 ENCOUNTER — Encounter
Payer: BLUE CROSS/BLUE SHIELD | Attending: Physical Medicine & Rehabilitation | Admitting: Physical Medicine & Rehabilitation

## 2017-07-05 ENCOUNTER — Encounter: Payer: Self-pay | Admitting: Physical Medicine & Rehabilitation

## 2017-07-05 VITALS — BP 119/84 | HR 103 | Resp 16 | Ht 64.0 in | Wt 139.6 lb

## 2017-07-05 DIAGNOSIS — G8929 Other chronic pain: Secondary | ICD-10-CM | POA: Insufficient documentation

## 2017-07-05 DIAGNOSIS — I890 Lymphedema, not elsewhere classified: Secondary | ICD-10-CM | POA: Insufficient documentation

## 2017-07-05 DIAGNOSIS — Z853 Personal history of malignant neoplasm of breast: Secondary | ICD-10-CM | POA: Diagnosis not present

## 2017-07-05 DIAGNOSIS — T451X5A Adverse effect of antineoplastic and immunosuppressive drugs, initial encounter: Secondary | ICD-10-CM | POA: Diagnosis not present

## 2017-07-05 DIAGNOSIS — M545 Low back pain: Secondary | ICD-10-CM | POA: Insufficient documentation

## 2017-07-05 DIAGNOSIS — Z9221 Personal history of antineoplastic chemotherapy: Secondary | ICD-10-CM | POA: Insufficient documentation

## 2017-07-05 DIAGNOSIS — M5416 Radiculopathy, lumbar region: Secondary | ICD-10-CM | POA: Diagnosis not present

## 2017-07-05 DIAGNOSIS — M47816 Spondylosis without myelopathy or radiculopathy, lumbar region: Secondary | ICD-10-CM | POA: Insufficient documentation

## 2017-07-05 DIAGNOSIS — G620 Drug-induced polyneuropathy: Secondary | ICD-10-CM | POA: Diagnosis not present

## 2017-07-05 MED ORDER — OXYCODONE-ACETAMINOPHEN 10-325 MG PO TABS: 1.0000 | ORAL_TABLET | Freq: Three times a day (TID) | ORAL | 0 refills | Status: DC | PRN

## 2017-07-05 MED ORDER — FENTANYL 12 MCG/HR TD PT72: 12.5000 ug | MEDICATED_PATCH | TRANSDERMAL | 0 refills | Status: DC

## 2017-07-05 MED ORDER — BACLOFEN 10 MG PO TABS: 10.0000 mg | ORAL_TABLET | Freq: Three times a day (TID) | ORAL | 3 refills | Status: DC | PRN

## 2017-07-05 NOTE — Progress Notes (Signed)
Subjective:    Patient ID: Krista Doyle, female    DOB: [birth year], 57 y.o.   MRN: [id number]  HPI   Patient is here in follow-up of her chronic pain.  She states that her low back and legs have been fairly stable from a pain standpoint.  She has noticed however over the last 2 months or so that her upper to mid back has been more tender.  She finds it difficult with standing or when she reaches overhead.  I asked her what she did for pain relief and stated that occasionally she used a heating pad.  She remains on Percocet and fentanyl patch for her other pain.  She is doing her low back stretches on a regular basis.  She utilizes a cane for balance.    Pain Inventory Average Pain 7 Pain Right Now 6 My pain is constant, sharp and stabbing  In the last 24 hours, has pain interfered with the following? General activity 7 Relation with others 6 Enjoyment of life 8 What TIME of day is your pain at its worst? evening Sleep (in general) Fair  Pain is worse with: walking, bending, sitting and standing Pain improves with: rest, heat/ice, medication and TENS Relief from Meds: 7  Mobility walk with assistance use a cane use a walker ability to climb steps?  yes do you drive?  yes  Function disabled: date disabled .  Neuro/Psych weakness numbness tingling trouble walking spasms  Prior Studies Any changes since last visit?  no  Physicians involved in your care Any changes since last visit?  no   Family History  Problem Relation Age of Onset  . Colon cancer Neg Hx    Social History   Socioeconomic History  . Marital status: Married    Spouse name: Not on file  . Number of children: Not on file  . Years of education: Not on file  . Highest education level: Not on file  Occupational History  . Occupation: Disabled  Social Needs  . Financial resource strain: Not on file  . Food insecurity:    Worry: Not on file    Inability: Not on file  .  Transportation needs:    Medical: Not on file    Non-medical: Not on file  Tobacco Use  . Smoking status: Never Smoker  . Smokeless tobacco: Never Used  Substance and Sexual Activity  . Alcohol use: No    Alcohol/week: 0.0 oz  . Drug use: No  . Sexual activity: Yes  Lifestyle  . Physical activity:    Days per week: Not on file    Minutes per session: Not on file  . Stress: Not on file  Relationships  . Social connections:    Talks on phone: Not on file    Gets together: Not on file    Attends religious service: Not on file    Active member of club or organization: Not on file    Attends meetings of clubs or organizations: Not on file    Relationship status: Not on file  Other Topics Concern  . Not on file  Social History Narrative  . Not on file   Past Surgical History:  Procedure Laterality Date  . BACK SURGERY    . BREAST LUMPECTOMY    . COLONOSCOPY     about 3 yrs ago in [place]  . KNEE SURGERY     right  . TONSILLECTOMY     Past Medical History:  Diagnosis  Date  . Asthma   . Back pain   . Breast cancer ([place]) 2008   left  . Diabetes ([place])   . Hypercholesterolemia   . Hypertension   . 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 Personal history of chemotherapy   . Personal history of radiation therapy 2008   BP 119/84   Pulse (!) 103   Resp 16   Ht 5\' 4"  (1.626 m)   Wt 139 lb 9.6 oz (63.3 kg)   BMI 23.96 kg/m   Opioid Risk Score:   Fall Risk Score:  `1  Depression screen PHQ 2/9  Depression screen [facility] 2/9 04/21/2017 01/13/2017 05/27/2016 03/28/2016 10/07/2015 08/31/2015 07/06/2015  Decreased Interest - 0 0 0 0 1 0  Down, Depressed, Hopeless - 0 0 0 0 0 0  PHQ - 2 Score - 0 0 0 0 1 0  Altered sleeping 0 - - - - - -  Tired, decreased energy 0 - - - - - -  Change in appetite - - - - - - -  Feeling bad or failure about yourself  - - - - - - -  Trouble concentrating - - - - - - -  Moving slowly or fidgety/restless - - - - - - -  Suicidal thoughts - -  - - - - -  PHQ-9 Score - - - - - - -  Difficult doing work/chores - - - - - - -  \  Review of Systems  Constitutional: Negative.   HENT: Negative.   Eyes: Negative.   Respiratory: Negative.   Cardiovascular: Negative.   Gastrointestinal: Positive for constipation.  Endocrine: Negative.   Genitourinary: Negative.   Musculoskeletal: Positive for arthralgias, back pain, gait problem, joint swelling and myalgias.  Allergic/Immunologic: Negative.   Neurological: Positive for weakness and numbness.  Hematological: Negative.   Psychiatric/Behavioral: Negative.   All other systems reviewed and are negative.      Objective:   Physical Exam  General: No acute distress HEENT: EOMI, oral membranes moist Cards: reg rate  Chest: normal effort Abdomen: Soft, NT, ND Skin: dry, intact Extremities: no edema  Musculoskeletal:  Normal Muscle Bulk and Muscle Testing Reveals: Mild kyphosis, shoulder rounded. Thoracic levels sensitive with spasms from the T4-T10 level.  Head forward posture. Ongoing lumbar spine tenderness. Right: Greater Trochanter Tenderness Gait antalgic   Neurological: She is alert and oriented to person, place, and time. Sensory diminished distally.  Utilizes a cane for balance Skin: Skin is warm and dry.  Psychiatric: Pleasant as always.   .         Assessment & Plan:  1. Chronic low back pain, Lumbar Facet Arthropathy,lumbar spondylosis, spondylolisthesis, with lumbar radiculopathy.  -maintain Fentanyl 12 mcg one patch every three days #10 and Oxycodone 10/325 mg one tablet every 8 hours as needed for pain #90.    -We will continue the controlled substance monitoring program, this consists of regular clinic visits, examinations, routine drug screening, pill counts as well as use of [place] Controlled Substance Reporting System. [place].   2. Chemotherapy induced peripheral neuropathy: continue lyrica which has been effective. 3.  Chronic Midline Thoracic Pain: increasing intensity over the last couple months 4. Muscle Spasm: discussed posture and pilates principles. Exercises were provided  -change flexeril to baclofen  -Consider course of physical therapy to address posture.  She is also looking into the [facility] as the offer numerous classes we discussed today including Pilates based  classes.  She would do well in pool classes as well. 5. Right Knee Pain:Stable at present  20 minutes of face to face patient care time was spent during this visit. All questions were encouraged and answered.  Patient will follow-up in about 1 month's time.

## 2017-07-05 NOTE — Patient Instructions (Signed)
PLEASE FEEL FREE TO CALL OUR OFFICE WITH ANY PROBLEMS OR QUESTIONS ([phone])      

## 2017-07-10 DIAGNOSIS — I10 Essential (primary) hypertension: Secondary | ICD-10-CM | POA: Diagnosis not present

## 2017-07-10 DIAGNOSIS — J4520 Mild intermittent asthma, uncomplicated: Secondary | ICD-10-CM | POA: Diagnosis not present

## 2017-07-10 DIAGNOSIS — E119 Type 2 diabetes mellitus without complications: Secondary | ICD-10-CM | POA: Diagnosis not present

## 2017-07-10 DIAGNOSIS — G629 Polyneuropathy, unspecified: Secondary | ICD-10-CM | POA: Diagnosis not present

## 2017-07-12 ENCOUNTER — Telehealth: Payer: Self-pay | Admitting: *Deleted

## 2017-07-12 NOTE — Telephone Encounter (Signed)
Formal warning letter mailed for cancellations and no shows.

## 2017-07-17 ENCOUNTER — Encounter: Payer: Self-pay | Admitting: Orthopedic Surgery

## 2017-07-17 ENCOUNTER — Ambulatory Visit (INDEPENDENT_AMBULATORY_CARE_PROVIDER_SITE_OTHER): Payer: BLUE CROSS/BLUE SHIELD | Admitting: Orthopedic Surgery

## 2017-07-17 VITALS — BP 140/85 | HR 96 | Ht 64.0 in | Wt 137.0 lb

## 2017-07-17 DIAGNOSIS — M654 Radial styloid tenosynovitis [de Quervain]: Secondary | ICD-10-CM

## 2017-07-17 NOTE — Progress Notes (Signed)
Krista Doyle  07/17/2017  HISTORY SECTION :  Chief Complaint  Patient presents with  . 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   Review of Systems  Musculoskeletal: Positive for back pain.  Skin: Negative.   Neurological:       Denies nerve symptoms in the left upper extremity    Past Medical History:  Diagnosis Date  . Asthma   . Back pain   . Breast cancer ([place]) 2008   left  . Diabetes ([place])   . Hypercholesterolemia   . Hypertension   . 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 Personal history of chemotherapy   . Personal history of radiation therapy 2008    Allergies  Allergen Reactions  . Other     Cat/dog dander  . Pollen Extract      Current Outpatient Medications:  .  albuterol (PROVENTIL HFA;VENTOLIN HFA) 108 (90 Base) MCG/ACT inhaler, Inhale 1 puff into the lungs every 6 (six) hours as needed for wheezing or shortness of breath., Disp: , Rfl:  .  atorvastatin (LIPITOR) 20 MG tablet, Take 20 mg by mouth daily., Disp: , Rfl:  .  baclofen (LIORESAL) 10 MG tablet, Take 1 tablet (10 mg total) by mouth 3 (three) times daily as needed for muscle spasms., Disp: 60 tablet, Rfl: 3 .  budesonide-formoterol (SYMBICORT) 160-4.5 MCG/ACT inhaler, Inhale 2 puffs into the lungs 2 (two) times daily., Disp: , Rfl:  .  calcium carbonate (OS-CAL - DOSED IN MG OF ELEMENTAL CALCIUM) 1250 (500 Ca) MG tablet, Take 1 tablet by mouth daily with breakfast., Disp: , Rfl:  .  ergocalciferol (VITAMIN D2) 50000 units  capsule, Take 50,000 Units by mouth once a week., Disp: , Rfl:  .  fentaNYL (DURAGESIC - DOSED MCG/HR) 12 MCG/HR, Place 1 patch (12.5 mcg total) onto the skin every 3 (three) days., Disp: 10 patch, Rfl: 0 .  insulin glargine (LANTUS) 100 UNIT/ML injection, Inject 24 Units into the skin at bedtime., Disp: , Rfl:  .  letrozole (FEMARA) 2.5 MG tablet, Take 2.5 mg by mouth daily., Disp: , Rfl:  .  loratadine (CLARITIN) 10 MG tablet, Take 10 mg by mouth daily as needed for allergies., Disp: , Rfl:  .  losartan (COZAAR) 100 MG tablet, Take 100 mg daily by mouth., Disp: , Rfl:  .  Multiple Vitamins-Minerals (ICAPS AREDS 2) CAPS, Take by mouth., Disp: , Rfl:  .  naloxegol oxalate (MOVANTIK) 25 MG TABS tablet, Take 1 tablet (25 mg total) by mouth daily., Disp: 30 tablet, Rfl: 3 .  oxyCODONE-acetaminophen (PERCOCET) 10-325 MG tablet, Take 1 tablet by mouth every 8 (eight) hours as needed for pain., Disp: 90 tablet, Rfl: 0 .  pregabalin (LYRICA) 200 MG capsule, Take 1 capsule (200 mg total) by mouth 2 (two) times daily., Disp: 60 capsule, Rfl: 5 .  sitaGLIPtin (JANUVIA) 100 MG tablet, Take 100 mg by mouth daily., Disp: , Rfl:  .  valsartan (DIOVAN) 160 MG tablet, Take  160 mg by mouth daily., Disp: , Rfl:  No current facility-administered medications for this visit.   Facility-Administered Medications Ordered in Other Visits:  .  0.9 %  sodium chloride infusion, , Intravenous, Once, [place], Kelby Fam, MD   PHYSICAL EXAM SECTION: BP 140/85   Pulse 96   Ht 5\' 4"  (1.626 m)   Wt 137 lb (62.1 kg)   BMI 23.52 kg/m   General appearance the patient is normally developed grooming and hygiene are normal  Oriented x3  Mood pleasant affect normal  Gait requires a cane  Extremity #1 left thumb Inspection tenderness over the first extensor compartment test with normal palpation over the A1 pulley  range of motion full Stability tests were normal Strength is 5/5 Skin was warm dry and intact without rash  lesion or ulceration Pulse and perfusion normal without edema Normal sensation Positive Finkelstein's test  Extremity #2 right thumb Inspection no swelling or tenderness Range of motion full Stability tests were normal Strength 5 out of 5 to manual muscle test Skin warm dry and intact without rash lesions or ulceration   MEDICAL DECISION SECTION:  Encounter Diagnosis  Name Primary?  Tennis Must Quervain's disease (tenosynovitis) Yes    Imaging none  Plan:  (Rx., Inj., surg., Frx, MRI/CT, XR:2) Patient did not want to have an injection when I told her about the possibility of skin skin discoloration  Recommend topical NSAIDs Splinting Ice Return 6 weeks

## 2017-07-17 NOTE — Patient Instructions (Addendum)
Aspercreme or Capzasin 3 times a day  Ice twice a day and thumb brace for 6 weeksDe Quervain Tenosynovitis Tendons attach muscles to bones. They also help with joint movements. When tendons become irritated or swollen, it is called tendinitis. The extensor pollicis brevis (EPB) tendon connects the EPB muscle to a bone that is near the base of the thumb. The EPB muscle helps to straighten and extend the thumb. De Quervain tenosynovitis is a condition in which the EPB tendon lining (sheath) becomes irritated, thickened, and swollen. This condition is sometimes called stenosing tenosynovitis. This condition causes pain on the thumb side of the back of the wrist. What are the causes? Causes of this condition include:  Activities that repeatedly cause your thumb and wrist to extend.  A sudden increase in activity or change in activity that affects your wrist.  What increases the risk? This condition is more likely to develop in:  Females.  People who have diabetes.  Women who have recently given birth.  People who are over 59 years of age.  People who do activities that involve repeated hand and wrist motions, such as tennis, racquetball, volleyball, gardening, and taking care of children.  People who do heavy labor.  People who have poor wrist strength and flexibility.  People who do not warm up properly before activities.  What are the signs or symptoms? Symptoms of this condition include:  Pain or tenderness over the thumb side of the back of the wrist when your thumb and wrist are not moving.  Pain that gets worse when you straighten your thumb or extend your thumb or wrist.  Pain when the injured area is touched.  Locking or catching of the thumb joint while you bend and straighten your thumb.  Decreased thumb motion due to pain.  Swelling over the affected area.  How is this diagnosed? This condition is diagnosed with a medical history and physical exam. Your health  care provider will ask for details about your injury and ask about your symptoms. How is this treated? Treatment may include the use of icing and medicines to reduce pain and swelling. You may also be advised to wear a splint or brace to limit your thumb and wrist motion. In less severe cases, treatment may also include working with a physical therapist to strengthen your wrist and calm the irritation around your EPB tendon sheath. In severe cases, surgery may be needed. Follow these instructions at home: If you have a splint or brace:  Wear it as told by your health care provider. Remove it only as told by your health care provider.  Loosen the splint or brace if your fingers become numb and tingle, or if they turn cold and blue.  Keep the splint or brace clean and dry. Managing pain, stiffness, and swelling  If directed, apply ice to the injured area. ? Put ice in a plastic bag. ? Place a towel between your skin and the bag. ? Leave the ice on for 20 minutes, 2-3 times per day.  Move your fingers often to avoid stiffness and to lessen swelling.  Raise (elevate) the injured area above the level of your heart while you are sitting or lying down. General instructions  Return to your normal activities as told by your health care provider. Ask your health care provider what activities are safe for you.  Take over-the-counter and prescription medicines only as told by your health care provider.  Keep all follow-up visits as told by  your health care provider. This is important.  Do not drive or operate heavy machinery while taking prescription pain medicine. Contact a health care provider if:  Your pain, tenderness, or swelling gets worse, even if you have had treatment.  You have numbness or tingling in your wrist, hand, or fingers on the injured side. This information is not intended to replace advice given to you by your health care provider. Make sure you discuss any questions you  have with your health care provider. Document Released: 01/17/2005 Document Revised: 06/25/2015 Document Reviewed: 03/25/2014 Elsevier Interactive Patient Education  [employer].

## 2017-07-24 ENCOUNTER — Ambulatory Visit: Payer: BLUE CROSS/BLUE SHIELD | Admitting: Registered Nurse

## 2017-07-31 ENCOUNTER — Encounter: Payer: Self-pay | Admitting: Registered Nurse

## 2017-07-31 ENCOUNTER — Encounter: Payer: BLUE CROSS/BLUE SHIELD | Attending: Physical Medicine & Rehabilitation | Admitting: Registered Nurse

## 2017-07-31 VITALS — BP 133/94 | HR 97 | Ht 63.0 in | Wt 136.6 lb

## 2017-07-31 DIAGNOSIS — M546 Pain in thoracic spine: Secondary | ICD-10-CM

## 2017-07-31 DIAGNOSIS — Z853 Personal history of malignant neoplasm of breast: Secondary | ICD-10-CM | POA: Insufficient documentation

## 2017-07-31 DIAGNOSIS — G8929 Other chronic pain: Secondary | ICD-10-CM

## 2017-07-31 DIAGNOSIS — Z9221 Personal history of antineoplastic chemotherapy: Secondary | ICD-10-CM | POA: Diagnosis not present

## 2017-07-31 DIAGNOSIS — M62838 Other muscle spasm: Secondary | ICD-10-CM | POA: Diagnosis not present

## 2017-07-31 DIAGNOSIS — G894 Chronic pain syndrome: Secondary | ICD-10-CM | POA: Diagnosis not present

## 2017-07-31 DIAGNOSIS — T451X5A Adverse effect of antineoplastic and immunosuppressive drugs, initial encounter: Secondary | ICD-10-CM | POA: Diagnosis not present

## 2017-07-31 DIAGNOSIS — Z5181 Encounter for therapeutic drug level monitoring: Secondary | ICD-10-CM

## 2017-07-31 DIAGNOSIS — G620 Drug-induced polyneuropathy: Secondary | ICD-10-CM | POA: Insufficient documentation

## 2017-07-31 DIAGNOSIS — I890 Lymphedema, not elsewhere classified: Secondary | ICD-10-CM | POA: Insufficient documentation

## 2017-07-31 DIAGNOSIS — M545 Low back pain: Secondary | ICD-10-CM | POA: Insufficient documentation

## 2017-07-31 DIAGNOSIS — M5416 Radiculopathy, lumbar region: Secondary | ICD-10-CM

## 2017-07-31 DIAGNOSIS — Z79899 Other long term (current) drug therapy: Secondary | ICD-10-CM | POA: Diagnosis not present

## 2017-07-31 DIAGNOSIS — M47816 Spondylosis without myelopathy or radiculopathy, lumbar region: Secondary | ICD-10-CM | POA: Diagnosis not present

## 2017-07-31 MED ORDER — FENTANYL 12 MCG/HR TD PT72: 12.5000 ug | MEDICATED_PATCH | TRANSDERMAL | 0 refills | Status: DC

## 2017-07-31 MED ORDER — PREGABALIN 200 MG PO CAPS: 200.0000 mg | ORAL_CAPSULE | Freq: Two times a day (BID) | ORAL | 5 refills | Status: DC

## 2017-07-31 MED ORDER — OXYCODONE-ACETAMINOPHEN 10-325 MG PO TABS: 1.0000 | ORAL_TABLET | Freq: Three times a day (TID) | ORAL | 0 refills | Status: DC | PRN

## 2017-07-31 NOTE — Progress Notes (Signed)
Subjective:    Patient ID: Krista Doyle, female    DOB: 08-31-1960, 57 y.o.   MRN: [id number]  HPI: Krista Doyle is a 58 year old female who returns for follow up appointment for chronic pain and medication refill. She states her pain is located in her right shoulder, upper-lower back radiating into right lower extremity. She rates her pain 7. Her current exercise regime is walking and attending the [facility] for pool therapy.   Krista Doyle reports this morning she had a grapefruit and experienced a bout of emesis x1.   Krista Doyle Morphine Equivalent is 73.80 MME.  Last Oral Swab was Performed on 02/24/2017, it was consistent. Oral Swab Performed Today.   Pain Inventory Average Pain 6 Pain Right Now 7 My pain is .  In the last 24 hours, has pain interfered with the following? General activity 6 Relation with others 7 Enjoyment of life 7 What TIME of day is your pain at its worst? evening Sleep (in general) Fair  Pain is worse with: walking, bending, sitting and standing Pain improves with: medication Relief from Meds: 8  Mobility walk with assistance use a cane use a walker ability to climb steps?  yes do you drive?  yes  Function disabled: date disabled .  Neuro/Psych weakness numbness tingling spasms  Prior Studies Any changes since last visit?  no  Physicians involved in your care Any changes since last visit?  no   Family History  Problem Relation Age of Onset  . Colon cancer Neg Hx    Social History   Socioeconomic History  . Marital status: Married    Spouse name: Not on file  . Number of children: Not on file  . Years of education: Not on file  . Highest education level: Not on file  Occupational History  . Occupation: Disabled  Social Needs  . Financial resource strain: Not on file  . Food insecurity:    Worry: Not on file    Inability: Not on file  . Transportation needs:    Medical: Not on file    Non-medical: Not on  file  Tobacco Use  . Smoking status: Never Smoker  . Smokeless tobacco: Never Used  Substance and Sexual Activity  . Alcohol use: No    Alcohol/week: 0.0 oz  . Drug use: No  . Sexual activity: Yes  Lifestyle  . Physical activity:    Days per week: Not on file    Minutes per session: Not on file  . Stress: Not on file  Relationships  . Social connections:    Talks on phone: Not on file    Gets together: Not on file    Attends religious service: Not on file    Active member of club or organization: Not on file    Attends meetings of clubs or organizations: Not on file    Relationship status: Not on file  Other Topics Concern  . Not on file  Social History Narrative  . Not on file   Past Surgical History:  Procedure Laterality Date  . BACK SURGERY    . BREAST LUMPECTOMY    . COLONOSCOPY     about 3 yrs ago in [place]  . KNEE SURGERY     right  . TONSILLECTOMY     Past Medical History:  Diagnosis Date  . Asthma   . Back pain   . Breast cancer ([place]) 2008   left  . Diabetes ([place])   .  Hypercholesterolemia   . Hypertension   . 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 Personal history of chemotherapy   . Personal history of radiation therapy 2008   Ht 5\' 3"  (1.6 m)   Wt 136 lb 9.6 oz (62 kg)   BMI 24.20 kg/m   Opioid Risk Score:   Fall Risk Score:  `1  Depression screen PHQ 2/9  Depression screen [facility] 2/9 04/21/2017 01/13/2017 05/27/2016 03/28/2016 10/07/2015 08/31/2015 07/06/2015  Decreased Interest - 0 0 0 0 1 0  Down, Depressed, Hopeless - 0 0 0 0 0 0  PHQ - 2 Score - 0 0 0 0 1 0  Altered sleeping 0 - - - - - -  Tired, decreased energy 0 - - - - - -  Change in appetite - - - - - - -  Feeling bad or failure about yourself  - - - - - - -  Trouble concentrating - - - - - - -  Moving slowly or fidgety/restless - - - - - - -  Suicidal thoughts - - - - - - -  PHQ-9 Score - - - - - - -  Difficult doing work/chores - - - - - - -     Review of Systems    Constitutional: Negative.   HENT: Negative.   Eyes: Negative.   Respiratory: Negative.   Cardiovascular: Negative.   Gastrointestinal: Positive for nausea and vomiting.  Endocrine: Negative.   Genitourinary: Negative.   Musculoskeletal: Positive for arthralgias, back pain, gait problem and myalgias.  Skin: Negative.   Allergic/Immunologic: Negative.   Neurological: Positive for weakness and numbness.  Hematological: Negative.   Psychiatric/Behavioral: Negative.   All other systems reviewed and are negative.      Objective:   Physical Exam  Constitutional: She is oriented to person, place, and time. She appears well-developed and well-nourished.  HENT:  Head: Normocephalic and atraumatic.  Neck: Normal range of motion. Neck supple.  Cardiovascular: Normal rate and regular rhythm.  Pulmonary/Chest: Effort normal and breath sounds normal.  Musculoskeletal:  Normal Muscle Bulk and Muscle Testing Reveals: Upper Extremities:Right:  Full ROM and Muscle Strength 5/5 Right AC Joint Tenderness Left: Decreased ROM 30 Degrees and Muscle Strength 4/5 Wearing Left Wrist Splint Thoracic Paraspinal Tenderness: T-1- T-3  T-7-T-9 Lumbar Paraspinal Tenderness: L-3-L-5 Lower Extremites: Right: Decreased ROM and Muscle Strength 5/5. Right Lower Extremity Flexion Produces Pain into Extremity Left: Full ROM and Muscle Strength 5/5 Arises from Table slowly using cane for support Antalgic Gait     Neurological: She is alert and oriented to person, place, and time.  Skin: Skin is warm and dry.  Psychiatric: She has a normal mood and affect. Her behavior is normal.  Nursing note and vitals reviewed.         Assessment & Plan:  1. Chronic low back pain, Lumbar Facet Arthropathy,lumbar spondylosis, spondylolisthesis, with lumbar radiculopathy.Continue medication regimen: 07/31/2017 Refilled: Fentanyl 12 mcg one patch every three days #10 and Oxycodone 10/325 mg one tablet every 8 hours as  needed for pain #90.  We will continue the opioid monitoring program, this consists of regular clinic visits, examinations, urine drug screen, pill counts as well as use of [place] Controlled Substance reporting System. 2. Chemotherapy induced peripheral neuropathy:Continue Lyrica at200mg BID. 07/31/2017 3. Chronic Midline Thoracic Pain: Continue HEP and Current Medication Regime. 07/31/2017 4. Muscle Spasm: Continue medication regimen with Flexeril.07/31/2017 5. Right Knee Pain:No complaints today.Continue Current Medication Regime: Continue HEP as  Tolerated. Continue to Monitor.07/31/2017  20 minutes of face to face patient care time was spent during this visit. All questions were encouraged and answered.  F/U in 1 month

## 2017-08-03 LAB — DRUG TOX MONITOR 1 W/CONF, ORAL FLD
Amphetamines: NEGATIVE ng/mL (ref <10)
Barbiturates: NEGATIVE ng/mL (ref <10)
Benzodiazepines: NEGATIVE ng/mL (ref <0.50)
Buprenorphine: NEGATIVE ng/mL (ref <0.10)
Cocaine: NEGATIVE ng/mL (ref <5.0)
Codeine: NEGATIVE ng/mL (ref <2.5)
Dihydrocodeine: NEGATIVE ng/mL (ref <2.5)
Fentanyl: 0.35 ng/mL — ABNORMAL HIGH (ref <0.10)
Fentanyl: POSITIVE ng/mL — AB (ref <0.10)
Heroin Metabolite: NEGATIVE ng/mL (ref <1.0)
Hydrocodone: NEGATIVE ng/mL (ref <2.5)
Hydromorphone: NEGATIVE ng/mL (ref <2.5)
MARIJUANA: NEGATIVE ng/mL (ref <2.5)
MDMA: NEGATIVE ng/mL (ref <10)
Meprobamate: NEGATIVE ng/mL (ref <2.5)
Methadone: NEGATIVE ng/mL (ref <5.0)
Morphine: NEGATIVE ng/mL (ref <2.5)
Nicotine Metabolite: NEGATIVE ng/mL (ref <5.0)
Norhydrocodone: NEGATIVE ng/mL (ref <2.5)
Noroxycodone: 5.7 ng/mL — ABNORMAL HIGH (ref <2.5)
Opiates: POSITIVE ng/mL — AB (ref <2.5)
Oxycodone: 35.2 ng/mL — ABNORMAL HIGH (ref <2.5)
Oxymorphone: NEGATIVE ng/mL (ref <2.5)
Phencyclidine: NEGATIVE ng/mL (ref <10)
Tapentadol: NEGATIVE ng/mL (ref <5.0)
Tramadol: NEGATIVE ng/mL (ref <5.0)
Zolpidem: NEGATIVE ng/mL (ref <5.0)

## 2017-08-03 LAB — DRUG TOX ALC METAB W/CON, ORAL FLD: Alcohol Metabolite: NEGATIVE ng/mL (ref <25)

## 2017-08-07 ENCOUNTER — Telehealth: Payer: Self-pay | Admitting: *Deleted

## 2017-08-07 NOTE — Telephone Encounter (Signed)
Oral swab drug screen was consistent for prescribed medications.  ?

## 2017-09-07 ENCOUNTER — Encounter: Payer: Self-pay | Admitting: Registered Nurse

## 2017-09-07 ENCOUNTER — Encounter: Payer: BLUE CROSS/BLUE SHIELD | Attending: Physical Medicine & Rehabilitation | Admitting: Registered Nurse

## 2017-09-07 ENCOUNTER — Other Ambulatory Visit (HOSPITAL_COMMUNITY): Payer: Self-pay

## 2017-09-07 VITALS — BP 110/73 | HR 101 | Resp 14 | Ht 64.0 in | Wt 140.0 lb

## 2017-09-07 DIAGNOSIS — G620 Drug-induced polyneuropathy: Secondary | ICD-10-CM | POA: Diagnosis not present

## 2017-09-07 DIAGNOSIS — M546 Pain in thoracic spine: Secondary | ICD-10-CM

## 2017-09-07 DIAGNOSIS — G894 Chronic pain syndrome: Secondary | ICD-10-CM

## 2017-09-07 DIAGNOSIS — T451X5A Adverse effect of antineoplastic and immunosuppressive drugs, initial encounter: Secondary | ICD-10-CM | POA: Diagnosis not present

## 2017-09-07 DIAGNOSIS — M5416 Radiculopathy, lumbar region: Secondary | ICD-10-CM | POA: Diagnosis not present

## 2017-09-07 DIAGNOSIS — Z170 Estrogen receptor positive status [ER+]: Principal | ICD-10-CM

## 2017-09-07 DIAGNOSIS — Z9221 Personal history of antineoplastic chemotherapy: Secondary | ICD-10-CM | POA: Insufficient documentation

## 2017-09-07 DIAGNOSIS — Z5181 Encounter for therapeutic drug level monitoring: Secondary | ICD-10-CM | POA: Diagnosis not present

## 2017-09-07 DIAGNOSIS — C50412 Malignant neoplasm of upper-outer quadrant of left female breast: Secondary | ICD-10-CM

## 2017-09-07 DIAGNOSIS — G8929 Other chronic pain: Secondary | ICD-10-CM | POA: Diagnosis not present

## 2017-09-07 DIAGNOSIS — M47816 Spondylosis without myelopathy or radiculopathy, lumbar region: Secondary | ICD-10-CM | POA: Diagnosis not present

## 2017-09-07 DIAGNOSIS — Z853 Personal history of malignant neoplasm of breast: Secondary | ICD-10-CM | POA: Insufficient documentation

## 2017-09-07 DIAGNOSIS — M545 Low back pain: Secondary | ICD-10-CM | POA: Diagnosis not present

## 2017-09-07 DIAGNOSIS — Z79899 Other long term (current) drug therapy: Secondary | ICD-10-CM

## 2017-09-07 DIAGNOSIS — Z79811 Long term (current) use of aromatase inhibitors: Secondary | ICD-10-CM

## 2017-09-07 DIAGNOSIS — I890 Lymphedema, not elsewhere classified: Secondary | ICD-10-CM | POA: Insufficient documentation

## 2017-09-07 MED ORDER — OXYCODONE-ACETAMINOPHEN 10-325 MG PO TABS: 1.0000 | ORAL_TABLET | Freq: Three times a day (TID) | ORAL | 0 refills | Status: DC | PRN

## 2017-09-07 MED ORDER — FENTANYL 12 MCG/HR TD PT72: 12.5000 ug | MEDICATED_PATCH | TRANSDERMAL | 0 refills | Status: DC

## 2017-09-07 NOTE — Progress Notes (Signed)
Subjective:    Patient ID: Krista Doyle, female    DOB: [birth year], 57 y.o.   MRN: [id number]  HPI: Krista Doyle is a 57 year old female who returns for follow up appointment for chronic pain and medication refill. She states her pain is located in her mid- lower back pain radiating into her right lower extremity. She rates her pain 6. Her current exercise regime is walking and pool therapy three days a week.   Ms. Peil Morphine Equivalent is 73.80 MME. Last Oral Swab was Performed on 07/31/2017, it was consistent.    Pain Inventory Average Pain 8 Pain Right Now 6 My pain is constant  In the last 24 hours, has pain interfered with the following? General activity 6 Relation with others 6 Enjoyment of life 6 What TIME of day is your pain at its worst? evening, night  Sleep (in general) Fair  Pain is worse with: walking, bending, sitting, standing and some activites Pain improves with: medication and TENS Relief from Meds: 8  Mobility walk with assistance use a cane use a walker do you drive?  yes Do you have any goals in this area?  yes  Function disabled: date disabled .  Neuro/Psych weakness numbness tingling trouble walking spasms  Prior Studies Any changes since last visit?  no  Physicians involved in your care Any changes since last visit?  no   Family History  Problem Relation Age of Onset  . Colon cancer Neg Hx    Social History   Socioeconomic History  . Marital status: Married    Spouse name: Not on file  . Number of children: Not on file  . Years of education: Not on file  . Highest education level: Not on file  Occupational History  . Occupation: Disabled  Social Needs  . Financial resource strain: Not on file  . Food insecurity:    Worry: Not on file    Inability: Not on file  . Transportation needs:    Medical: Not on file    Non-medical: Not on file  Tobacco Use  . Smoking status: Never Smoker  .  Smokeless tobacco: Never Used  Substance and Sexual Activity  . Alcohol use: No    Alcohol/week: 0.0 standard drinks  . Drug use: No  . Sexual activity: Yes  Lifestyle  . Physical activity:    Days per week: Not on file    Minutes per session: Not on file  . Stress: Not on file  Relationships  . Social connections:    Talks on phone: Not on file    Gets together: Not on file    Attends religious service: Not on file    Active member of club or organization: Not on file    Attends meetings of clubs or organizations: Not on file    Relationship status: Not on file  Other Topics Concern  . Not on file  Social History Narrative  . Not on file   Past Surgical History:  Procedure Laterality Date  . BACK SURGERY    . BREAST LUMPECTOMY    . COLONOSCOPY     about 3 yrs ago in   . KNEE SURGERY     right  . TONSILLECTOMY     Past Medical History:  Diagnosis Date  . Asthma   . Back pain   . Breast cancer ([place]) 2008   left  . Diabetes ([place])   . Hypercholesterolemia   . Hypertension   .  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 Personal history of chemotherapy   . Personal history of radiation therapy 2008   BP 110/73 (BP Location: Right Arm, Patient Position: Sitting, Cuff Size: Normal)   Pulse (!) 101   Resp 14   Ht 5\' 4"  (1.626 m)   Wt 140 lb (63.5 kg)   SpO2 98%   BMI 24.03 kg/m   Opioid Risk Score:   Fall Risk Score:  `1  Depression screen PHQ 2/9  Depression screen [facility] 2/9 04/21/2017 01/13/2017 05/27/2016 03/28/2016 10/07/2015 08/31/2015 07/06/2015  Decreased Interest - 0 0 0 0 1 0  Down, Depressed, Hopeless - 0 0 0 0 0 0  PHQ - 2 Score - 0 0 0 0 1 0  Altered sleeping 0 - - - - - -  Tired, decreased energy 0 - - - - - -  Change in appetite - - - - - - -  Feeling bad or failure about yourself  - - - - - - -  Trouble concentrating - - - - - - -  Moving slowly or fidgety/restless - - - - - - -  Suicidal thoughts - - - - - - -  PHQ-9 Score - - - - - - -    Difficult doing work/chores - - - - - - -     Review of Systems  Constitutional: Positive for unexpected weight change.  Eyes: Negative.   Respiratory: Negative.   Cardiovascular: Positive for leg swelling.  Gastrointestinal: Positive for constipation.  Endocrine: Negative.   Genitourinary: Negative.   Musculoskeletal: Positive for back pain and gait problem.       Spasms  Skin: Negative.   Allergic/Immunologic: Negative.   Neurological: Positive for weakness and numbness.       Tingling  Hematological: Negative.   Psychiatric/Behavioral: Negative.   All other systems reviewed and are negative.      Objective:   Physical Exam  Constitutional: She is oriented to person, place, and time. She appears well-developed and well-nourished.  HENT:  Head: Normocephalic and atraumatic.  Neck: Normal range of motion.  Cardiovascular: Normal rate and regular rhythm.  Pulmonary/Chest: Effort normal and breath sounds normal.  Musculoskeletal:  Normal Muscle Bulk and Muscle Testing Reveals: Upper Extremities: Right: Full ROM and Muscle Strength 5/5  Right AC Joint Tenderness Left: Decreased ROM: 45 Degrees and Muscle Strength 4/5 Thoracic Hypersensitivity: T-1-T-7 Lumbar Paraspinal Tenderness: Lumbar 3-5 Mainly Right Side Lower Extremities: Left: Full ROM and Muscle Strength 5/5 Right Decreased ROM and Muscle Strength 4/5 Arises from Table Slowly using cane for support Narrow Based Gait   Neurological: She is alert and oriented to person, place, and time.  Skin: Skin is warm and dry.  Psychiatric: She has a normal mood and affect. Her behavior is normal.  Nursing note and vitals reviewed.         Assessment & Plan:  1. Chronic low back pain, Lumbar Facet Arthropathy,lumbar spondylosis, spondylolisthesis, with lumbar radiculopathy.Continue medication regimen:09/08/2017 Refilled: Fentanyl 12 mcg one patch every three days #10 and Oxycodone 10/325 mg one tablet every 8 hours  as needed for pain #90.  We will continue the opioid monitoring program, this consists of regular clinic visits, examinations, urine drug screen, pill counts as well as use of [place] Controlled Substance reporting System. 2. Chemotherapy induced peripheral neuropathy:Continue Lyrica at200mg BID. 09/08/2017 3. Chronic Midline Thoracic Pain: Continue HEP and Current Medication Regime. 09/08/2017 4. Muscle Spasm: Continuemedication regimen withFlexeril.09/08/2017 5.  Right Knee Pain:No complaints today.Continue Current Medication Regime: Continue HEP as Tolerated. Continue to Monitor.09/08/2017  20 minutes of face to face patient care time was spent during this visit. All questions were encouraged and answered.  F/U in 1 month

## 2017-09-11 ENCOUNTER — Ambulatory Visit (INDEPENDENT_AMBULATORY_CARE_PROVIDER_SITE_OTHER): Payer: BLUE CROSS/BLUE SHIELD | Admitting: Orthopedic Surgery

## 2017-09-11 VITALS — BP 110/71 | HR 94 | Ht 64.0 in | Wt 140.0 lb

## 2017-09-11 DIAGNOSIS — M654 Radial styloid tenosynovitis [de Quervain]: Secondary | ICD-10-CM | POA: Diagnosis not present

## 2017-09-11 NOTE — Progress Notes (Signed)
FOLLOW UP:   Chief Complaint  Patient presents with  . Follow-up    Recheck on left thumb.   57 year old female evaluated for de Quervain's syndrome treated with topical NSAIDs splinting ice and 6-week follow-up declined injection on initial presentation  Complains of no improvement in symptoms  Review of systems she has no numbness or tingling in the finger  BP 110/71   Pulse 94   Ht 5\' 4"  (1.626 m)   Wt 140 lb (63.5 kg)   BMI 24.03 kg/m   General appearance normal body habitus  Oriented x3  Mood and affect normal but affect flat  Ambulates with a cane   Left thumb evaluation tenderness over the first extensor compartment painful ulnar deviation wrist stable CMC joint nontender extension strength normal including IP joint skin is clean dry and intact pulse and perfusion are normal no sensory deficits.   After discussion again and warning of possible skin discoloration patient has agreed to injection versus continued splinting ice and surgery   Procedure note injection left wrist for de Quervain's syndrome The patient has consented for injection of his left wrist for de Quervain's syndrome 40 mg of Depo-Medrol 1 mL, 3 mL 1% lidocaine. Patient gave verbal consent timeout to confirm site of injection  Sterile technique ethyl chloride used for skin prep  No complications   FOLLOW IN 6 WEEKS  CONTINUE SPLINTING

## 2017-09-11 NOTE — Patient Instructions (Signed)
Continue splinting ice and topical medication such as  These are the muscle and arthrits creams I recommend:  PLEASE READ THE PACKAGE INSTRUCTIONS BEFORE USING   Ben Gay arthritis cream  Icy hot vanishing gel  Aspercreme odor free  Myoflex Oderless pain reliever  Capzasin  Sportscreme  Max freeze

## 2017-09-13 ENCOUNTER — Other Ambulatory Visit (HOSPITAL_COMMUNITY): Payer: BLUE CROSS/BLUE SHIELD

## 2017-09-13 ENCOUNTER — Ambulatory Visit (HOSPITAL_COMMUNITY): Payer: BLUE CROSS/BLUE SHIELD

## 2017-09-13 ENCOUNTER — Ambulatory Visit (HOSPITAL_COMMUNITY): Payer: BLUE CROSS/BLUE SHIELD | Admitting: Internal Medicine

## 2017-09-14 ENCOUNTER — Other Ambulatory Visit (HOSPITAL_COMMUNITY): Payer: BLUE CROSS/BLUE SHIELD

## 2017-09-14 ENCOUNTER — Ambulatory Visit (HOSPITAL_COMMUNITY): Payer: BLUE CROSS/BLUE SHIELD

## 2017-09-14 ENCOUNTER — Ambulatory Visit (HOSPITAL_COMMUNITY): Payer: BLUE CROSS/BLUE SHIELD | Admitting: Internal Medicine

## 2017-09-20 ENCOUNTER — Inpatient Hospital Stay (HOSPITAL_COMMUNITY): Payer: BLUE CROSS/BLUE SHIELD

## 2017-09-20 ENCOUNTER — Inpatient Hospital Stay (HOSPITAL_BASED_OUTPATIENT_CLINIC_OR_DEPARTMENT_OTHER): Payer: BLUE CROSS/BLUE SHIELD | Admitting: Internal Medicine

## 2017-09-20 ENCOUNTER — Other Ambulatory Visit (HOSPITAL_COMMUNITY): Payer: Self-pay | Admitting: Internal Medicine

## 2017-09-20 ENCOUNTER — Encounter (HOSPITAL_COMMUNITY): Payer: Self-pay | Admitting: Internal Medicine

## 2017-09-20 ENCOUNTER — Ambulatory Visit (HOSPITAL_COMMUNITY)
Admission: RE | Admit: 2017-09-20 | Discharge: 2017-09-20 | Disposition: A | Discharge status: Home / Self Care | Payer: BLUE CROSS/BLUE SHIELD | Source: Ambulatory Visit | Attending: Internal Medicine | Admitting: Internal Medicine

## 2017-09-20 ENCOUNTER — Inpatient Hospital Stay (HOSPITAL_COMMUNITY): Payer: BLUE CROSS/BLUE SHIELD | Attending: Internal Medicine

## 2017-09-20 VITALS — BP 138/87 | HR 86 | Temp 98.2°F | Resp 14 | Wt 133.7 lb

## 2017-09-20 DIAGNOSIS — C50412 Malignant neoplasm of upper-outer quadrant of left female breast: Secondary | ICD-10-CM | POA: Diagnosis not present

## 2017-09-20 DIAGNOSIS — M5134 Other intervertebral disc degeneration, thoracic region: Secondary | ICD-10-CM | POA: Insufficient documentation

## 2017-09-20 DIAGNOSIS — E559 Vitamin D deficiency, unspecified: Secondary | ICD-10-CM | POA: Diagnosis not present

## 2017-09-20 DIAGNOSIS — Z79899 Other long term (current) drug therapy: Secondary | ICD-10-CM

## 2017-09-20 DIAGNOSIS — E7800 Pure hypercholesterolemia, unspecified: Secondary | ICD-10-CM | POA: Insufficient documentation

## 2017-09-20 DIAGNOSIS — D241 Benign neoplasm of right breast: Secondary | ICD-10-CM | POA: Insufficient documentation

## 2017-09-20 DIAGNOSIS — M549 Dorsalgia, unspecified: Secondary | ICD-10-CM | POA: Insufficient documentation

## 2017-09-20 DIAGNOSIS — M8580 Other specified disorders of bone density and structure, unspecified site: Secondary | ICD-10-CM

## 2017-09-20 DIAGNOSIS — I10 Essential (primary) hypertension: Secondary | ICD-10-CM | POA: Diagnosis not present

## 2017-09-20 DIAGNOSIS — Z79811 Long term (current) use of aromatase inhibitors: Secondary | ICD-10-CM

## 2017-09-20 DIAGNOSIS — Z170 Estrogen receptor positive status [ER+]: Secondary | ICD-10-CM

## 2017-09-20 DIAGNOSIS — G8929 Other chronic pain: Secondary | ICD-10-CM

## 2017-09-20 DIAGNOSIS — E119 Type 2 diabetes mellitus without complications: Secondary | ICD-10-CM | POA: Insufficient documentation

## 2017-09-20 DIAGNOSIS — M545 Low back pain, unspecified: Secondary | ICD-10-CM

## 2017-09-20 DIAGNOSIS — Z794 Long term (current) use of insulin: Secondary | ICD-10-CM | POA: Insufficient documentation

## 2017-09-20 LAB — CBC WITH DIFFERENTIAL/PLATELET
Basophils Absolute: 0 10*3/uL (ref 0.0–0.1)
Basophils Relative: 0 %
Eosinophils Absolute: 0.1 10*3/uL (ref 0.0–0.7)
Eosinophils Relative: 2 %
HCT: 39.9 % (ref 36.0–46.0)
Hemoglobin: 12.7 g/dL (ref 12.0–15.0)
Lymphocytes Relative: 42 %
Lymphs Abs: 3.6 10*3/uL (ref 0.7–4.0)
MCH: 26 pg (ref 26.0–34.0)
MCHC: 31.8 g/dL (ref 30.0–36.0)
MCV: 81.8 fL (ref 78.0–100.0)
Monocytes Absolute: 0.6 10*3/uL (ref 0.1–1.0)
Monocytes Relative: 7 %
Neutro Abs: 4.1 10*3/uL (ref 1.7–7.7)
Neutrophils Relative %: 49 %
Platelets: 270 10*3/uL (ref 150–400)
RBC: 4.88 MIL/uL (ref 3.87–5.11)
RDW: 13.5 % (ref 11.5–15.5)
WBC: 8.4 10*3/uL (ref 4.0–10.5)

## 2017-09-20 LAB — COMPREHENSIVE METABOLIC PANEL
ALT: 12 U/L (ref 0–44)
AST: 17 U/L (ref 15–41)
Albumin: 3.8 g/dL (ref 3.5–5.0)
Alkaline Phosphatase: 53 U/L (ref 38–126)
Anion gap: 8 (ref 5–15)
BUN: 12 mg/dL (ref 6–20)
CO2: 28 mmol/L (ref 22–32)
Calcium: 9.7 mg/dL (ref 8.9–10.3)
Chloride: 103 mmol/L (ref 98–111)
Creatinine, Ser: 0.74 mg/dL (ref 0.44–1.00)
GFR calc Af Amer: >60 mL/min (ref >60)
GFR calc non Af Amer: >60 mL/min (ref >60)
Glucose, Bld: 90 mg/dL (ref 70–99)
Potassium: 3.7 mmol/L (ref 3.5–5.1)
Sodium: 139 mmol/L (ref 135–145)
Total Bilirubin: 0.5 mg/dL (ref 0.3–1.2)
Total Protein: 7.8 g/dL (ref 6.5–8.1)

## 2017-09-20 MED ORDER — SODIUM CHLORIDE 0.9 % IV SOLN: Freq: Once | INTRAVENOUS | Status: DC

## 2017-09-20 MED ORDER — DENOSUMAB 60 MG/ML ~~LOC~~ SOSY
60.0000 mg | PREFILLED_SYRINGE | Freq: Once | SUBCUTANEOUS | Status: AC
  Administered 2017-09-20: 60 mg via SUBCUTANEOUS
  Filled 2017-09-20: qty 1

## 2017-09-20 NOTE — Progress Notes (Signed)
Diagnosis Osteopenia determined by x-ray - Plan: DG Sternum, MM Digital Screening, CBC with Differential/Platelet, Comprehensive metabolic panel, Lactate dehydrogenase, CANCELED: DG Thoracolumabar Spine, CANCELED: DG Thoracic Spine 4V  Malignant neoplasm of upper-outer quadrant of left breast in female, estrogen receptor positive ([place]) - Plan: DG Sternum, MM Digital Screening, CBC with Differential/Platelet, Comprehensive metabolic panel, Lactate dehydrogenase, CANCELED: DG Thoracolumabar Spine  Staging Cancer Staging No matching staging information was found for the patient.  Assessment and Plan:  1.   Left Breast Cancer  ER+, PR+, HER-2+. Reportedly completed one year of Herceptin.  Original breast cancer was diagnosed in 2008.  She took tamoxifen for 5 years.  She was previously followed by Dr. Talbert Cage and was planned to complete 5 years of Femara in 2018.  Patient reports she remains on Femara therapy.  Bilateral screening mammogram that was done on 03/08/2017 was negative. Will review records of original diagnosis and treatments and if pt has completed 5 years of therapy will recommend discontinuation of Femara.    Labs done 09/20/2017 reviewed and showed WBC 8.4 HB 12.7 plts 270,000, Chemistries WNL with K+ 3.7, Cr 0.74 and normal LFTs, ALK Phos.   She is set up for  bilateral screening mammogram in February 2020 and she will RTC at that time for follow-up.    2.  Back pain.  This has been a chronic problem.  Bone scan done 05/10/2017 showed question of compression fractures.  She has undergone MRI of the spine 10/17/2014 that showed spinal stenosis.  She underwent Thoracic x-rays as well as sternal x-rays today 09/20/2017 that showed DJD and osteopenia.  She is being followed by pain management and may need orthopedic evaluation if ongoing symptoms.  Will check SPEP on RTC.    3.  Osteopenia.  She had a bone density done on 03/08/2017 that showed osteopenia.  She is being treated with Prolia every 6  months.  She will have repeat BMD in 03/2019.   Continue Ca++ and vitamin D.   4.  Right breast fibroadenoma.  This is considered a benign breast lesion.  Right breast screening mammogram that was done on 03/08/2017 was negative.  She is set up for bilateral screening mammogram in 03/2018 and will follow-up at that time to go over results.     5.  Health maintenance.  GI follow-up as recommended.   Current Status: Patient is here today for follow-up.  She is here to go over labs.  She continues to report back pain.  She remains on Femara.  Problem List Patient Active Problem List   Diagnosis Date Noted  . Lumbar facet arthropathy [M47.816] 10/07/2015  . 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 Constipation due to opioid therapy [K59.03, T40.2X5A] 05/08/2015  . Genetic testing [Z13.79] 03/17/2015  . Osteopenia determined by x-ray [M85.80] 03/13/2015  . Lumbar spondylosis [M47.816] 02/11/2015  . Lumbar radiculopathy [M54.16] 02/11/2015  . Chemotherapy-induced peripheral neuropathy ([place]) [G62.0, T45.1X5A] 02/11/2015  . Vitamin D deficiency [E55.9] 02/02/2015  . Breast cancer of upper-outer quadrant of left female breast ([place]) [C50.412] 12/22/2014  . High risk medication use [Z79.899] 12/22/2014    Past Medical History Past Medical History:  Diagnosis Date  . Asthma   . Back pain   . Breast cancer ([place]) 2008   left  . Diabetes ([place])   . Hypercholesterolemia   . Hypertension   . 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 Personal history of chemotherapy   . Personal history of radiation therapy 2008  Past Surgical History Past Surgical History:  Procedure Laterality Date  . BACK SURGERY    . BREAST LUMPECTOMY    . COLONOSCOPY     about 3 yrs ago in [place]  . KNEE SURGERY     right  . TONSILLECTOMY      Family History Family History  Problem Relation Age of Onset  . Colon cancer Neg Hx      Social History  reports that she has never smoked. She has never used  smokeless tobacco. She reports that she does not drink alcohol or use drugs.  Medications  Current Outpatient Medications:  .  albuterol (PROVENTIL HFA;VENTOLIN HFA) 108 (90 Base) MCG/ACT inhaler, Inhale 1 puff into the lungs every 6 (six) hours as needed for wheezing or shortness of breath., Disp: , Rfl:  .  atorvastatin (LIPITOR) 20 MG tablet, Take 20 mg by mouth daily., Disp: , Rfl:  .  baclofen (LIORESAL) 10 MG tablet, Take 1 tablet (10 mg total) by mouth 3 (three) times daily as needed for muscle spasms., Disp: 60 tablet, Rfl: 3 .  budesonide-formoterol (SYMBICORT) 160-4.5 MCG/ACT inhaler, Inhale 2 puffs into the lungs 2 (two) times daily., Disp: , Rfl:  .  calcium carbonate (OS-CAL - DOSED IN MG OF ELEMENTAL CALCIUM) 1250 (500 Ca) MG tablet, Take 1 tablet by mouth daily with breakfast., Disp: , Rfl:  .  cyclobenzaprine (FLEXERIL) 10 MG tablet, Take 10 mg by mouth 2 (two) times daily., Disp: , Rfl: 3 .  ergocalciferol (VITAMIN D2) 50000 units capsule, Take 50,000 Units by mouth once a week., Disp: , Rfl:  .  fentaNYL (DURAGESIC - DOSED MCG/HR) 12 MCG/HR, Place 1 patch (12.5 mcg total) onto the skin every 3 (three) days., Disp: 10 patch, Rfl: 0 .  insulin glargine (LANTUS) 100 UNIT/ML injection, Inject 24 Units into the skin at bedtime., Disp: , Rfl:  .  letrozole (FEMARA) 2.5 MG tablet, Take 2.5 mg by mouth daily., Disp: , Rfl:  .  loratadine (CLARITIN) 10 MG tablet, Take 10 mg by mouth daily as needed for allergies., Disp: , Rfl:  .  losartan (COZAAR) 100 MG tablet, Take 100 mg daily by mouth., Disp: , Rfl:  .  Multiple Vitamins-Minerals (ICAPS AREDS 2) CAPS, Take by mouth., Disp: , Rfl:  .  naloxegol oxalate (MOVANTIK) 25 MG TABS tablet, Take 1 tablet (25 mg total) by mouth daily., Disp: 30 tablet, Rfl: 3 .  oxyCODONE-acetaminophen (PERCOCET) 10-325 MG tablet, Take 1 tablet by mouth every 8 (eight) hours as needed for pain., Disp: 90 tablet, Rfl: 0 .  pregabalin (LYRICA) 200 MG capsule,  Take 1 capsule (200 mg total) by mouth 2 (two) times daily., Disp: 60 capsule, Rfl: 5 .  sitaGLIPtin (JANUVIA) 100 MG tablet, Take 100 mg by mouth daily., Disp: , Rfl:  .  valsartan (DIOVAN) 160 MG tablet, Take 160 mg by mouth daily., Disp: , Rfl:   Allergies Other and Pollen extract  Review of Systems Review of Systems - Oncology ROS negative other than back pain.     Physical Exam  Vitals Wt Readings from Last 3 Encounters:  09/20/17 133 lb 11.2 oz (60.6 kg)  09/11/17 140 lb (63.5 kg)  09/07/17 140 lb (63.5 kg)   Temp Readings from Last 3 Encounters:  09/20/17 98.2 F (36.8 C) (Oral)  05/03/17 98.4 F (36.9 C)  03/16/17 98.7 F (37.1 C) (Oral)   BP Readings from Last 3 Encounters:  09/20/17 138/87  09/11/17 110/71  09/07/17  110/73   Pulse Readings from Last 3 Encounters:  09/20/17 86  09/11/17 94  09/07/17 (!) 101     Constitutional: Well-developed, well-nourished, and in no distress.   HENT: Head: Normocephalic and atraumatic.  Mouth/Throat: No oropharyngeal exudate. Mucosa moist. Eyes: Pupils are equal, round, and reactive to light. Conjunctivae are normal. No scleral icterus.  Neck: Normal range of motion. Neck supple. No JVD present.  Cardiovascular: Normal rate, regular rhythm and normal heart sounds.  Exam reveals no gallop and no friction rub.   No murmur heard. Pulmonary/Chest: Effort normal and breath sounds normal. No respiratory distress. No wheezes.No rales.  Abdominal: Soft. Bowel sounds are normal. No distension. There is no tenderness. There is no guarding.  Musculoskeletal: No edema or tenderness.  Lymphadenopathy: No cervical, axillary or supraclavicular adenopathy.  Neurological: Alert and oriented to person, place, and time. No cranial nerve deficit.  Skin: Skin is warm and dry. No rash noted. No erythema. No pallor.  Psychiatric: Affect and judgment normal.  Bilateral breast exam: Chaperone present.  Left breast with well-healed surgical  scar in the left upper outer quadrant.  No palpable dominant masses noted bilaterally.     Labs Appointment on 09/20/2017  Component Date Value Ref Range Status  . WBC 09/20/2017 8.4  4.0 - 10.5 K/uL Final  . RBC 09/20/2017 4.88  3.87 - 5.11 MIL/uL Final  . Hemoglobin 09/20/2017 12.7  12.0 - 15.0 g/dL Final  . HCT 09/20/2017 39.9  36.0 - 46.0 % Final  . MCV 09/20/2017 81.8  78.0 - 100.0 fL Final  . MCH 09/20/2017 26.0  26.0 - 34.0 pg Final  . MCHC 09/20/2017 31.8  30.0 - 36.0 g/dL Final  . RDW 09/20/2017 13.5  11.5 - 15.5 % Final  . Platelets 09/20/2017 270  150 - 400 K/uL Final  . Neutrophils Relative % 09/20/2017 49  % Final  . Neutro Abs 09/20/2017 4.1  1.7 - 7.7 K/uL Final  . Lymphocytes Relative 09/20/2017 42  % Final  . Lymphs Abs 09/20/2017 3.6  0.7 - 4.0 K/uL Final  . Monocytes Relative 09/20/2017 7  % Final  . Monocytes Absolute 09/20/2017 0.6  0.1 - 1.0 K/uL Final  . Eosinophils Relative 09/20/2017 2  % Final  . Eosinophils Absolute 09/20/2017 0.1  0.0 - 0.7 K/uL Final  . Basophils Relative 09/20/2017 0  % Final  . Basophils Absolute 09/20/2017 0.0  0.0 - 0.1 K/uL Final   Performed at [facility], [address], [place], [place] [address]  . Sodium 09/20/2017 139  135 - 145 mmol/L Final  . Potassium 09/20/2017 3.7  3.5 - 5.1 mmol/L Final  . Chloride 09/20/2017 103  98 - 111 mmol/L Final  . CO2 09/20/2017 28  22 - 32 mmol/L Final  . Glucose, Bld 09/20/2017 90  70 - 99 mg/dL Final  . BUN 09/20/2017 12  6 - 20 mg/dL Final  . Creatinine, Ser 09/20/2017 0.74  0.44 - 1.00 mg/dL Final  . Calcium 09/20/2017 9.7  8.9 - 10.3 mg/dL Final  . Total Protein 09/20/2017 7.8  6.5 - 8.1 g/dL Final  . Albumin 09/20/2017 3.8  3.5 - 5.0 g/dL Final  . AST 09/20/2017 17  15 - 41 U/L Final  . ALT 09/20/2017 12  0 - 44 U/L Final  . Alkaline Phosphatase 09/20/2017 53  38 - 126 U/L Final  . Total Bilirubin 09/20/2017 0.5  0.3 - 1.2 mg/dL Final  . GFR calc non Af Amer 09/20/2017 >60  >  60  mL/min Final  . GFR calc Af Amer 09/20/2017 >60  >60 mL/min Final   Comment: (NOTE) The eGFR has been calculated using the CKD EPI equation. This calculation has not been validated in all clinical situations. eGFR's persistently <60 mL/min signify possible Chronic Kidney Disease.   Georgiann Hahn gap 09/20/2017 8  5 - 15 Final   Performed at [facility], [address], [place], [place] [address]     Pathology Orders Placed This Encounter  Procedures  . DG Sternum    Standing Status:   Future    Number of Occurrences:   1    Standing Expiration Date:   11/21/2018    Order Specific Question:   Reason for Exam (SYMPTOM  OR DIAGNOSIS REQUIRED)    Answer:   back pain.  compression fractures noted on bone scan    Order Specific Question:   Is patient pregnant?    Answer:   No    Order Specific Question:   Preferred imaging location?    Answer:   [facility]    Order Specific Question:   Radiology Contrast Protocol - do NOT remove file path    Answer:   [email or web address]  . MM Digital Screening    Standing Status:   Future    Standing Expiration Date:   09/20/2018    Order Specific Question:   Reason for Exam (SYMPTOM  OR DIAGNOSIS REQUIRED)    Answer:   left breast cancer    Order Specific Question:   Is the patient pregnant?    Answer:   No    Order Specific Question:   Preferred imaging location?    Answer:   [facility]  . CBC with Differential/Platelet    Standing Status:   Future    Standing Expiration Date:   09/21/2019  . Comprehensive metabolic panel    Standing Status:   Future    Standing Expiration Date:   09/21/2019  . Lactate dehydrogenase    Standing Status:   Future    Standing Expiration Date:   09/21/2019       Zoila Shutter MD

## 2017-09-20 NOTE — Progress Notes (Signed)
Krista Doyle presents today for injection per MD orders. Prolia 60 mg administered SQ in right arm. Administration without incident. Patient tolerated well.

## 2017-09-20 NOTE — Patient Instructions (Signed)
Cancer Center at [facility] Discharge Instructions  You saw Dr. Higgs today.   Thank you for choosing  Cancer Center at [facility] to provide your oncology and hematology care.  To afford each patient quality time with our provider, please arrive at least 15 minutes before your scheduled appointment time.   If you have a lab appointment with the Cancer Center please come in thru the  Main Entrance and check in at the main information desk  You need to re-schedule your appointment should you arrive 10 or more minutes late.  We strive to give you quality time with our providers, and arriving late affects you and other patients whose appointments are after yours.  Also, if you no show three or more times for appointments you may be dismissed from the clinic at the providers discretion.     Again, thank you for choosing [facility].  Our hope is that these requests will decrease the amount of time that you wait before being seen by our physicians.       _____________________________________________________________  Should you have questions after your visit to [facility], please contact our office at [phone] between the hours of 8:00 a.m. and 4:30 p.m.  Voicemails left after 4:00 p.m. will not be returned until the following business day.  For prescription refill requests, have your pharmacy contact our office and allow 72 hours.    Cancer Center Support Programs:   > Cancer Support Group  2nd Tuesday of the month 1pm-2pm, Journey Room    

## 2017-10-03 ENCOUNTER — Other Ambulatory Visit: Payer: Self-pay | Admitting: Registered Nurse

## 2017-10-10 ENCOUNTER — Encounter: Payer: BLUE CROSS/BLUE SHIELD | Attending: Physical Medicine & Rehabilitation | Admitting: Registered Nurse

## 2017-10-10 ENCOUNTER — Encounter: Payer: Self-pay | Admitting: Registered Nurse

## 2017-10-10 VITALS — BP 125/82 | HR 85 | Ht 64.0 in | Wt 139.6 lb

## 2017-10-10 DIAGNOSIS — G894 Chronic pain syndrome: Secondary | ICD-10-CM | POA: Diagnosis not present

## 2017-10-10 DIAGNOSIS — M62838 Other muscle spasm: Secondary | ICD-10-CM

## 2017-10-10 DIAGNOSIS — Z5181 Encounter for therapeutic drug level monitoring: Secondary | ICD-10-CM | POA: Diagnosis not present

## 2017-10-10 DIAGNOSIS — G8929 Other chronic pain: Secondary | ICD-10-CM | POA: Diagnosis not present

## 2017-10-10 DIAGNOSIS — Z79899 Other long term (current) drug therapy: Secondary | ICD-10-CM

## 2017-10-10 DIAGNOSIS — I890 Lymphedema, not elsewhere classified: Secondary | ICD-10-CM | POA: Insufficient documentation

## 2017-10-10 DIAGNOSIS — M5416 Radiculopathy, lumbar region: Secondary | ICD-10-CM | POA: Diagnosis not present

## 2017-10-10 DIAGNOSIS — Z9221 Personal history of antineoplastic chemotherapy: Secondary | ICD-10-CM | POA: Diagnosis not present

## 2017-10-10 DIAGNOSIS — M546 Pain in thoracic spine: Secondary | ICD-10-CM | POA: Diagnosis not present

## 2017-10-10 DIAGNOSIS — M7061 Trochanteric bursitis, right hip: Secondary | ICD-10-CM | POA: Diagnosis not present

## 2017-10-10 DIAGNOSIS — G620 Drug-induced polyneuropathy: Secondary | ICD-10-CM | POA: Diagnosis not present

## 2017-10-10 DIAGNOSIS — M47816 Spondylosis without myelopathy or radiculopathy, lumbar region: Secondary | ICD-10-CM

## 2017-10-10 DIAGNOSIS — M545 Low back pain: Secondary | ICD-10-CM | POA: Insufficient documentation

## 2017-10-10 DIAGNOSIS — Z853 Personal history of malignant neoplasm of breast: Secondary | ICD-10-CM | POA: Insufficient documentation

## 2017-10-10 DIAGNOSIS — T451X5A Adverse effect of antineoplastic and immunosuppressive drugs, initial encounter: Secondary | ICD-10-CM

## 2017-10-10 MED ORDER — OXYCODONE-ACETAMINOPHEN 10-325 MG PO TABS: 1.0000 | ORAL_TABLET | Freq: Three times a day (TID) | ORAL | 0 refills | Status: DC | PRN

## 2017-10-10 MED ORDER — FENTANYL 12 MCG/HR TD PT72: 12.5000 ug | MEDICATED_PATCH | TRANSDERMAL | 0 refills | Status: DC

## 2017-10-10 NOTE — Progress Notes (Signed)
Subjective:    Patient ID: Krista Doyle, female    DOB: Mar 09, 1960, 57 y.o.   MRN: [id number]  HPI: [name] is a 57 year old female who returns for follow up appointment for chronic pain and medication refill. She states her pain is located in her mid-lower back radiating into her right hip and right lower extremity. She rates her pain 6. Her current exercise regime is walking and attending the [facility] for Pool Therapy three times a week.   Krista Doyle Morphine Equivalent is 73.80 MME.Last Oral Swab was Performed on 07/31/2017, it was consistent.   Pain Inventory Average Pain 7 Pain Right Now 6 My pain is sharp, stabbing and tingling  In the last 24 hours, has pain interfered with the following? General activity 8 Relation with others 7 Enjoyment of life 8 What TIME of day is your pain at its worst? evening Sleep (in general) Fair  Pain is worse with: walking, bending, sitting and standing Pain improves with: rest and medication Relief from Meds: 7  Mobility walk without assistance walk with assistance use a cane use a walker ability to climb steps?  yes do you drive?  yes  Function disabled: date disabled 2008  Neuro/Psych numbness tingling trouble walking dizziness  Prior Studies Any changes since last visit?  no  Physicians involved in your care Any changes since last visit?  no   Family History  Problem Relation Age of Onset  . Colon cancer Neg Hx    Social History   Socioeconomic History  . Marital status: Married    Spouse name: Not on file  . Number of children: Not on file  . Years of education: Not on file  . Highest education level: Not on file  Occupational History  . Occupation: Disabled  Social Needs  . Financial resource strain: Not on file  . Food insecurity:    Worry: Not on file    Inability: Not on file  . Transportation needs:    Medical: Not on file    Non-medical: Not on file  Tobacco Use  .  Smoking status: Never Smoker  . Smokeless tobacco: Never Used  Substance and Sexual Activity  . Alcohol use: No    Alcohol/week: 0.0 standard drinks  . Drug use: No  . Sexual activity: Yes  Lifestyle  . Physical activity:    Days per week: Not on file    Minutes per session: Not on file  . Stress: Not on file  Relationships  . Social connections:    Talks on phone: Not on file    Gets together: Not on file    Attends religious service: Not on file    Active member of club or organization: Not on file    Attends meetings of clubs or organizations: Not on file    Relationship status: Not on file  Other Topics Concern  . Not on file  Social History Narrative  . Not on file   Past Surgical History:  Procedure Laterality Date  . BACK SURGERY    . BREAST LUMPECTOMY    . COLONOSCOPY     about 3 yrs ago in [place]  . KNEE SURGERY     right  . TONSILLECTOMY     Past Medical History:  Diagnosis Date  . Asthma   . Back pain   . Breast cancer ([place]) 2008   left  . Diabetes ([place])   . Hypercholesterolemia   . Hypertension   .  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 Personal history of chemotherapy   . Personal history of radiation therapy 2008   There were no vitals taken for this visit.  Opioid Risk Score:   Fall Risk Score:  `1  Depression screen PHQ 2/9  Depression screen [facility] 2/9 04/21/2017 01/13/2017 05/27/2016 03/28/2016 10/07/2015 08/31/2015 07/06/2015  Decreased Interest - 0 0 0 0 1 0  Down, Depressed, Hopeless - 0 0 0 0 0 0  PHQ - 2 Score - 0 0 0 0 1 0  Altered sleeping 0 - - - - - -  Tired, decreased energy 0 - - - - - -  Change in appetite - - - - - - -  Feeling bad or failure about yourself  - - - - - - -  Trouble concentrating - - - - - - -  Moving slowly or fidgety/restless - - - - - - -  Suicidal thoughts - - - - - - -  PHQ-9 Score - - - - - - -  Difficult doing work/chores - - - - - - -     Review of Systems  Constitutional: Positive for chills,  diaphoresis and fever.  HENT: Negative.   Eyes: Negative.   Respiratory: Negative.   Cardiovascular: Negative.   Gastrointestinal: Positive for abdominal pain, diarrhea, nausea and vomiting.  Endocrine: Negative.   Genitourinary: Negative.   Musculoskeletal: Positive for arthralgias, back pain, gait problem and myalgias.  Skin: Negative.   Allergic/Immunologic: Negative.   Neurological: Positive for numbness.  Hematological: Negative.   Psychiatric/Behavioral: Negative.        Objective:   Physical Exam  Constitutional: She is oriented to person, place, and time. She appears well-developed and well-nourished.  HENT:  Head: Normocephalic and atraumatic.  Neck: Normal range of motion. Neck supple.  Cardiovascular: Normal rate and regular rhythm.  Pulmonary/Chest: Effort normal and breath sounds normal.  Musculoskeletal:  Normal Muscle Bulk and Muscle Testing Reveals: Upper Extremities: Right:Full ROM and Muscle Strength 5/5 Left: Decreased ROM 90 Degrees and Muscle Strength 3/5 Lumbar Hypersensitivity Right Greater Trochanter Tenderness Lower Extremities: Full ROM and Muscle Strength 5/5 Right Lower Extremity Flexion Produces Pain into Right Extremity Arises from Table with ease using cane for support   Neurological: She is alert and oriented to person, place, and time.  Skin: Skin is warm and dry.  Psychiatric: She has a normal mood and affect. Her behavior is normal.  Nursing note and vitals reviewed.         Assessment & Plan:  1. Chronic low back pain, Lumbar Facet Arthropathy,lumbar spondylosis, spondylolisthesis, with lumbar radiculopathy.Continue medication regimen:10/10/2017 Refilled: Fentanyl 12 mcg one patch every three days #10 and Oxycodone 10/325 mg one tablet every 8 hours as needed for pain #90.  We will continue the opioid monitoring program, this consists of regular clinic visits, examinations, urine drug screen, pill counts as well as use of [place] Controlled Substance reporting System. 2. Chemotherapy induced peripheral neuropathy:Continue Lyrica at200mg BID. 10/10/2017 3. Chronic Midline Thoracic Pain: Continue HEP and Current Medication Regime. 10/10/2017 4. Muscle Spasm: Continuemedication regimen withFlexeril.10/10/2017 5. Right Knee Pain:Continue Current Medication Regime: Continue HEP as Tolerated. Continue to Monitor.10/10/2017 6. Right Greater Trochanteric Bursitis: Continue to Alternate Ice and Heat Therapy.    20 minutes of face to face patient care time was spent during this visit. All questions were encouraged and answered.  F/U in 1 month

## 2017-10-23 ENCOUNTER — Ambulatory Visit: Payer: BLUE CROSS/BLUE SHIELD | Admitting: Orthopedic Surgery

## 2017-11-06 ENCOUNTER — Ambulatory Visit (INDEPENDENT_AMBULATORY_CARE_PROVIDER_SITE_OTHER): Payer: BLUE CROSS/BLUE SHIELD | Admitting: Orthopedic Surgery

## 2017-11-06 VITALS — BP 121/81 | HR 88 | Ht 64.0 in | Wt 139.0 lb

## 2017-11-06 DIAGNOSIS — M654 Radial styloid tenosynovitis [de Quervain]: Secondary | ICD-10-CM | POA: Diagnosis not present

## 2017-11-06 NOTE — Progress Notes (Signed)
Chief Complaint  Patient presents with  . Follow-up    Recheck on left dequervains.    57 YO FEMALE WITH left de Quervain's syndrome previously treated with splinting ice NSAIDs and injection presents back for follow-up.  She says her hand feels much better although she still has a little bit of tenderness she is wearing the brace for most activities during the day  Exam shows slight discoloration of skin from the injection mild tenderness over the wrist mildly positive Finkelstein's test  Recommend brace ice topical medications follow-up if symptoms worsen  Encounter Diagnosis  Name Primary?  Tennis Must Quervain's disease (tenosynovitis) Yes

## 2017-11-06 NOTE — Patient Instructions (Signed)
Apply ice twice a day  Wear brace for activities during the day  Use the following creams if needed   I recommend:  PLEASE READ THE PACKAGE INSTRUCTIONS BEFORE USING   Ben Gay arthritis cream  Icy hot vanishing gel  Aspercreme odor free  Myoflex Oderless pain reliever  Capzasin  Sportscreme  Max freeze

## 2017-11-07 ENCOUNTER — Encounter
Payer: BLUE CROSS/BLUE SHIELD | Attending: Physical Medicine & Rehabilitation | Admitting: Physical Medicine & Rehabilitation

## 2017-11-07 ENCOUNTER — Other Ambulatory Visit: Payer: Self-pay

## 2017-11-07 ENCOUNTER — Ambulatory Visit: Payer: BLUE CROSS/BLUE SHIELD | Admitting: Registered Nurse

## 2017-11-07 ENCOUNTER — Encounter: Payer: Self-pay | Admitting: Physical Medicine & Rehabilitation

## 2017-11-07 VITALS — BP 121/81 | HR 120 | Ht 64.0 in | Wt 139.0 lb

## 2017-11-07 DIAGNOSIS — M545 Low back pain: Secondary | ICD-10-CM | POA: Diagnosis not present

## 2017-11-07 DIAGNOSIS — M5416 Radiculopathy, lumbar region: Secondary | ICD-10-CM | POA: Diagnosis not present

## 2017-11-07 DIAGNOSIS — T451X5A Adverse effect of antineoplastic and immunosuppressive drugs, initial encounter: Secondary | ICD-10-CM

## 2017-11-07 DIAGNOSIS — G620 Drug-induced polyneuropathy: Secondary | ICD-10-CM | POA: Diagnosis not present

## 2017-11-07 DIAGNOSIS — M62838 Other muscle spasm: Secondary | ICD-10-CM | POA: Diagnosis not present

## 2017-11-07 DIAGNOSIS — Z853 Personal history of malignant neoplasm of breast: Secondary | ICD-10-CM | POA: Insufficient documentation

## 2017-11-07 DIAGNOSIS — M47816 Spondylosis without myelopathy or radiculopathy, lumbar region: Secondary | ICD-10-CM | POA: Diagnosis not present

## 2017-11-07 DIAGNOSIS — G894 Chronic pain syndrome: Secondary | ICD-10-CM | POA: Diagnosis not present

## 2017-11-07 DIAGNOSIS — Z9221 Personal history of antineoplastic chemotherapy: Secondary | ICD-10-CM | POA: Insufficient documentation

## 2017-11-07 DIAGNOSIS — I890 Lymphedema, not elsewhere classified: Secondary | ICD-10-CM | POA: Diagnosis not present

## 2017-11-07 DIAGNOSIS — G8929 Other chronic pain: Secondary | ICD-10-CM | POA: Diagnosis not present

## 2017-11-07 MED ORDER — FENTANYL 12 MCG/HR TD PT72: 12.5000 ug | MEDICATED_PATCH | TRANSDERMAL | 0 refills | Status: DC

## 2017-11-07 MED ORDER — AMITRIPTYLINE HCL 10 MG PO TABS: 10.0000 mg | ORAL_TABLET | Freq: Every day | ORAL | 2 refills | Status: DC

## 2017-11-07 MED ORDER — OXYCODONE-ACETAMINOPHEN 10-325 MG PO TABS: 1.0000 | ORAL_TABLET | Freq: Three times a day (TID) | ORAL | 0 refills | Status: DC | PRN

## 2017-11-07 MED ORDER — METHOCARBAMOL 500 MG PO TABS: 500.0000 mg | ORAL_TABLET | Freq: Four times a day (QID) | ORAL | 2 refills | Status: DC | PRN

## 2017-11-07 NOTE — Patient Instructions (Signed)
AMITRIPTYLINE: TAKE 10MG  AT BEDTIME FOR ONE WEEK, IF NO IMPROVEMENT, YOU CAN INCREASE TO 20MG  (2 TAB)

## 2017-11-07 NOTE — Progress Notes (Signed)
Subjective:    Patient ID: Krista Doyle, female    DOB: [birth year], 57 y.o.   MRN: [id number]  HPI   Mrs. Sadiyah Kangas is here in follow-up of her chronic pain.  She saw my nurse practitioner last month.  She is had ongoing pain in her upper back and left axillary area.  She describes the pain is tingling and burning as well as spastic at times.  The Flexeril and baclofen do not seem to provide much relief.  Her fentanyl and Percocet do give some pain control.  She remains active going to the gym and getting in the water for aquatic therapies.  She finds the movements of her shoulder, particularly with abduction seem to exacerbate her left side pain.  She remains on Lyrica for her neuropathic foot pain which is stable.  She remains limited however with balance because of her neuropathy as well as her back and right leg discomfort.  Pain Inventory Average Pain 6 Pain Right Now 7 My pain is sharp, stabbing and tingling  In the last 24 hours, has pain interfered with the following? General activity 6 Relation with others 7 Enjoyment of life 7 What TIME of day is your pain at its worst? evening and night Sleep (in general) Fair  Pain is worse with: walking, bending, sitting and standing Pain improves with: medication Relief from Meds: 3  Mobility walk with assistance use a cane use a walker how many minutes can you walk? 10 ability to climb steps?  yes do you drive?  yes Do you have any goals in this area?  yes  Function disabled: date disabled n/A I need assistance with the following:  household duties and shopping  Neuro/Psych weakness numbness tingling spasms  Prior Studies Any changes since last visit?  no  Physicians involved in your care Any changes since last visit?  no   Family History  Problem Relation Age of Onset  . Colon cancer Neg Hx    Social History   Socioeconomic History  . Marital status: Married    Spouse name: Not on file  . Number  of children: Not on file  . Years of education: Not on file  . Highest education level: Not on file  Occupational History  . Occupation: Disabled  Social Needs  . Financial resource strain: Not on file  . Food insecurity:    Worry: Not on file    Inability: Not on file  . Transportation needs:    Medical: Not on file    Non-medical: Not on file  Tobacco Use  . Smoking status: Never Smoker  . Smokeless tobacco: Never Used  Substance and Sexual Activity  . Alcohol use: No    Alcohol/week: 0.0 standard drinks  . Drug use: No  . Sexual activity: Yes  Lifestyle  . Physical activity:    Days per week: Not on file    Minutes per session: Not on file  . Stress: Not on file  Relationships  . Social connections:    Talks on phone: Not on file    Gets together: Not on file    Attends religious service: Not on file    Active member of club or organization: Not on file    Attends meetings of clubs or organizations: Not on file    Relationship status: Not on file  Other Topics Concern  . Not on file  Social History Narrative  . Not on file   Past Surgical History:  Procedure Laterality Date  . BACK SURGERY    . BREAST LUMPECTOMY    . COLONOSCOPY     about 3 yrs ago in [employer]  . KNEE SURGERY     right  . TONSILLECTOMY     Past Medical History:  Diagnosis Date  . Asthma   . Back pain   . Breast cancer ([place]) 2008   left  . Diabetes ([place])   . Hypercholesterolemia   . Hypertension   . 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 Personal history of chemotherapy   . Personal history of radiation therapy 2008   BP 121/81   Pulse (!) 120   Ht 5\' 4"  (1.626 m)   Wt 139 lb (63 kg)   BMI 23.86 kg/m   Opioid Risk Score:   Fall Risk Score:  `1  Depression screen PHQ 2/9  Depression screen [facility] 2/9 11/07/2017 04/21/2017 01/13/2017 05/27/2016 03/28/2016 10/07/2015 08/31/2015  Decreased Interest 0 - 0 0 0 0 1  Down, Depressed, Hopeless 0 - 0 0 0 0 0  PHQ - 2 Score 0 - 0 0 0 0  1  Altered sleeping - 0 - - - - -  Tired, decreased energy - 0 - - - - -  Change in appetite - - - - - - -  Feeling bad or failure about yourself  - - - - - - -  Trouble concentrating - - - - - - -  Moving slowly or fidgety/restless - - - - - - -  Suicidal thoughts - - - - - - -  PHQ-9 Score - - - - - - -  Difficult doing work/chores - - - - - - -    Review of Systems  Constitutional: Positive for unexpected weight change.  HENT: Negative.   Eyes: Negative.   Respiratory: Positive for wheezing.   Cardiovascular: Negative.   Gastrointestinal: Negative.   Endocrine: Negative.   Genitourinary: Negative.   Musculoskeletal: Negative.   Skin: Negative.   Allergic/Immunologic: Negative.   Neurological: Negative.   Hematological: Negative.   Psychiatric/Behavioral: Negative.   All other systems reviewed and are negative.      Objective:   Physical Exam General: No acute distress HEENT: EOMI, oral membranes moist Cards: reg rate  Chest: normal effort Abdomen: Soft, NT, ND Skin: dry, intact Extremities: no edema  Musculoskeletal: Normal Muscle Bulk and Muscle Testing Reveals: Mild kyphosis, shoulder rounded. Thoracic levels sensitive with spasms from the T4-T10 levels from spine around to left breast. Worse with shoulder ROM Ongoing lumbar spine tenderness with palpation and basic movement today Right: Greater Trochanter Tenderness Gait antalgic favoring RLE  Neurological: She isalertand oriented to person, place, and time. Sensory diminished distally.  stil utilizes cane for balance Skin: Skin iswarmand dry.  Psychiatric: Pleasant as always.       Assessment & Plan:   1. Chronic low back pain, Lumbar Facet Arthropathy,lumbar spondylosis, spondylolisthesis, with lumbar radiculopathy.            -continue Fentanyl 12 mcg one patch every three days #10 and Oxycodone 10/325 mg one tablet every 8 hours as needed for pain #90.             -We will continue the  controlled substance monitoring program, this consists of regular clinic visits, examinations, routine drug screening, pill counts as well as use of [place] Controlled Substance Reporting System. [place].   2. Chemotherapy induced peripheral neuropathy:  continue lyrica which has been effective for her feet.  3. Chronic Midline Thoracic Pain:ongoing in the T4-T10 level  -will introduce low dose elavil 10-20 mg qhs  4. Muscle Spasm: discussed posture and pilates principles. Exercises were provided            -dc baclofen and flexeril  -trial of robaxin 5. Right Knee Pain:Stable at present  15 minutes of face to face patient care time was spent during this visit. All questions were encouraged and answered.  Patient will follow-up in about 1 month's time with NP.Marland Kitchen

## 2017-11-27 DIAGNOSIS — I10 Essential (primary) hypertension: Secondary | ICD-10-CM | POA: Diagnosis not present

## 2017-11-27 DIAGNOSIS — M5126 Other intervertebral disc displacement, lumbar region: Secondary | ICD-10-CM | POA: Diagnosis not present

## 2017-11-27 DIAGNOSIS — E119 Type 2 diabetes mellitus without complications: Secondary | ICD-10-CM | POA: Diagnosis not present

## 2017-11-27 DIAGNOSIS — Z23 Encounter for immunization: Secondary | ICD-10-CM | POA: Diagnosis not present

## 2017-11-27 DIAGNOSIS — J4520 Mild intermittent asthma, uncomplicated: Secondary | ICD-10-CM | POA: Diagnosis not present

## 2017-12-11 ENCOUNTER — Other Ambulatory Visit: Payer: Self-pay

## 2017-12-11 ENCOUNTER — Encounter: Payer: BLUE CROSS/BLUE SHIELD | Attending: Physical Medicine & Rehabilitation | Admitting: Registered Nurse

## 2017-12-11 ENCOUNTER — Encounter: Payer: Self-pay | Admitting: Registered Nurse

## 2017-12-11 VITALS — BP 144/92 | HR 100 | Ht 63.0 in | Wt 139.2 lb

## 2017-12-11 DIAGNOSIS — T451X5A Adverse effect of antineoplastic and immunosuppressive drugs, initial encounter: Secondary | ICD-10-CM

## 2017-12-11 DIAGNOSIS — G894 Chronic pain syndrome: Secondary | ICD-10-CM

## 2017-12-11 DIAGNOSIS — Z5181 Encounter for therapeutic drug level monitoring: Secondary | ICD-10-CM

## 2017-12-11 DIAGNOSIS — Z9221 Personal history of antineoplastic chemotherapy: Secondary | ICD-10-CM | POA: Diagnosis not present

## 2017-12-11 DIAGNOSIS — G620 Drug-induced polyneuropathy: Secondary | ICD-10-CM | POA: Diagnosis not present

## 2017-12-11 DIAGNOSIS — Z79899 Other long term (current) drug therapy: Secondary | ICD-10-CM

## 2017-12-11 DIAGNOSIS — G8929 Other chronic pain: Secondary | ICD-10-CM

## 2017-12-11 DIAGNOSIS — I890 Lymphedema, not elsewhere classified: Secondary | ICD-10-CM | POA: Insufficient documentation

## 2017-12-11 DIAGNOSIS — M7061 Trochanteric bursitis, right hip: Secondary | ICD-10-CM

## 2017-12-11 DIAGNOSIS — M5416 Radiculopathy, lumbar region: Secondary | ICD-10-CM | POA: Diagnosis not present

## 2017-12-11 DIAGNOSIS — M545 Low back pain: Secondary | ICD-10-CM | POA: Diagnosis not present

## 2017-12-11 DIAGNOSIS — Z853 Personal history of malignant neoplasm of breast: Secondary | ICD-10-CM | POA: Insufficient documentation

## 2017-12-11 DIAGNOSIS — M47816 Spondylosis without myelopathy or radiculopathy, lumbar region: Secondary | ICD-10-CM

## 2017-12-11 DIAGNOSIS — M546 Pain in thoracic spine: Secondary | ICD-10-CM

## 2017-12-11 DIAGNOSIS — M62838 Other muscle spasm: Secondary | ICD-10-CM | POA: Diagnosis not present

## 2017-12-11 MED ORDER — FENTANYL 12 MCG/HR TD PT72: 12.5000 ug | MEDICATED_PATCH | TRANSDERMAL | 0 refills | Status: DC

## 2017-12-11 MED ORDER — OXYCODONE-ACETAMINOPHEN 10-325 MG PO TABS: 1.0000 | ORAL_TABLET | Freq: Three times a day (TID) | ORAL | 0 refills | Status: DC | PRN

## 2017-12-11 NOTE — Progress Notes (Signed)
Subjective:    Patient ID: Krista Doyle, female    DOB: 01/27/1961, 57 y.o.   MRN: [id number]  HPI: Ms. Steffani Dionisio is a 57 year old female who returns for follow up appointment for chronic pain and medication refill. She states her pain is located in her mid- lower back radiating into her right lower extremity and right hip pain. She rates her pain 6. Her current exercise regime is walking and water aerobics three times a week.  Ms. Durall Morphine Equivalent is 73.80 MME. Last Oral Swab was Performed on 07/31/2017, it was consistent.  Pain Inventory Average Pain 7 Pain Right Now 6 My pain is n/a  In the last 24 hours, has pain interfered with the following? General activity 8 Relation with others 7 Enjoyment of life 7 What TIME of day is your pain at its worst? evening and night Sleep (in general) Fair  Pain is worse with: walking, bending, sitting, standing and some activites Pain improves with: medication Relief from Meds: 8  Mobility use a cane use a walker how many minutes can you walk? 10 ability to climb steps?  yes do you drive?  yes  Function disabled: date disabled . I need assistance with the following:  household duties  Neuro/Psych weakness numbness tingling trouble walking spasms  Prior Studies Any changes since last visit?  no  Physicians involved in your care Any changes since last visit?  no   Family History  Problem Relation Age of Onset  . Colon cancer Neg Hx    Social History   Socioeconomic History  . Marital status: Married    Spouse name: Not on file  . Number of children: Not on file  . Years of education: Not on file  . Highest education level: Not on file  Occupational History  . Occupation: Disabled  Social Needs  . Financial resource strain: Not on file  . Food insecurity:    Worry: Not on file    Inability: Not on file  . Transportation needs:    Medical: Not on file    Non-medical: Not on  file  Tobacco Use  . Smoking status: Never Smoker  . Smokeless tobacco: Never Used  Substance and Sexual Activity  . Alcohol use: No    Alcohol/week: 0.0 standard drinks  . Drug use: No  . Sexual activity: Yes  Lifestyle  . Physical activity:    Days per week: Not on file    Minutes per session: Not on file  . Stress: Not on file  Relationships  . Social connections:    Talks on phone: Not on file    Gets together: Not on file    Attends religious service: Not on file    Active member of club or organization: Not on file    Attends meetings of clubs or organizations: Not on file    Relationship status: Not on file  Other Topics Concern  . Not on file  Social History Narrative  . Not on file   Past Surgical History:  Procedure Laterality Date  . BACK SURGERY    . BREAST LUMPECTOMY    . COLONOSCOPY     about 3 yrs ago in [place]  . KNEE SURGERY     right  . TONSILLECTOMY     Past Medical History:  Diagnosis Date  . Asthma   . Back pain   . Breast cancer ([place]) 2008   left  . Diabetes ([place])   .  Hypercholesterolemia   . Hypertension   . 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 Personal history of chemotherapy   . Personal history of radiation therapy 2008   BP (!) 144/92   Pulse (!) 102   Ht 5\' 3"  (1.6 m) Comment: reported  Wt 139 lb 3.2 oz (63.1 kg)   SpO2 93%   BMI 24.66 kg/m   Opioid Risk Score:   Fall Risk Score:  `1  Depression screen PHQ 2/9  Depression screen [facility] 2/9 12/11/2017 11/07/2017 04/21/2017 01/13/2017 05/27/2016 03/28/2016 10/07/2015  Decreased Interest 0 0 - 0 0 0 0  Down, Depressed, Hopeless 0 0 - 0 0 0 0  PHQ - 2 Score 0 0 - 0 0 0 0  Altered sleeping - - 0 - - - -  Tired, decreased energy - - 0 - - - -  Change in appetite - - - - - - -  Feeling bad or failure about yourself  - - - - - - -  Trouble concentrating - - - - - - -  Moving slowly or fidgety/restless - - - - - - -  Suicidal thoughts - - - - - - -  PHQ-9 Score - - - - - -  -  Difficult doing work/chores - - - - - - -    Review of Systems  Constitutional: Negative.   HENT: Negative.   Eyes: Negative.   Respiratory: Negative.   Cardiovascular: Positive for leg swelling.  Gastrointestinal: Negative.   Endocrine: Negative.   Genitourinary: Negative.   Musculoskeletal: Negative.        Spasms  Allergic/Immunologic: Negative.   Neurological: Positive for weakness and numbness.       Tingling  Hematological: Negative.   Psychiatric/Behavioral: Negative.   All other systems reviewed and are negative.      Objective:   Physical Exam  Constitutional: She is oriented to person, place, and time. She appears well-developed and well-nourished.  HENT:  Head: Normocephalic and atraumatic.  Neck: Normal range of motion. Neck supple.  Cardiovascular: Normal rate and regular rhythm.  Pulmonary/Chest: Effort normal and breath sounds normal.  Musculoskeletal:  Normal Muscle Bulk and Muscle Testing Reveals:  Upper Extremities: Right: Full ROM and Muscle Strength 5/5 Left: Decreased ROM 35 Degrees and Muscle Strength 4/5 Thoracic Paraspinal Tenderness: T-7-T-9 Lumbar Hypersensitivity Lower Extremities: Right: Decreased ROM and Muscle Strength 5/5 Right Lower Extremity Flexion Produces Pain into Lumbar Left: Full ROM and Muscle Strength 5/5 Arises from Table slowly  Narrow Based Gait   Neurological: She is alert and oriented to person, place, and time.  Skin: Skin is warm and dry.  Psychiatric: She has a normal mood and affect. Her behavior is normal.  Nursing note and vitals reviewed.         Assessment & Plan:  1. Chronic low back pain, Lumbar Facet Arthropathy,lumbar spondylosis, spondylolisthesis, with lumbar radiculopathy.Continue medication regimen:12/11/2017 Refilled: Fentanyl 12 mcg one patch every three days #10 and Oxycodone 10/325 mg one tablet every 8 hours as needed for pain #90.  We will continue the opioid monitoring program, this  consists of regular clinic visits, examinations, urine drug screen, pill counts as well as use of [place] Controlled Substance reporting System. 2. Chemotherapy induced peripheral neuropathy:Continue Lyrica at200mg BID. 12/11/2017 3. Chronic Midline Thoracic Pain: Continue HEP and Current Medication Regime. 12/11/2017 4. Muscle Spasm: Continuemedication regimen withFlexeril.12/11/2017 5. Right Knee Pain: No complaints today.Continue Current Medication Regime: Continue HEP as Tolerated. Continue to  Monitor.12/11/2017 6. Right Greater Trochanteric Bursitis: Continue to Alternate Ice and Heat Therapy. 12/11/2017   20 minutes of face to face patient care time was spent during this visit. All questions were encouraged and answered.  F/U in 1 month

## 2017-12-18 DIAGNOSIS — R234 Changes in skin texture: Secondary | ICD-10-CM | POA: Diagnosis not present

## 2017-12-18 DIAGNOSIS — B351 Tinea unguium: Secondary | ICD-10-CM | POA: Diagnosis not present

## 2017-12-18 DIAGNOSIS — E1140 Type 2 diabetes mellitus with diabetic neuropathy, unspecified: Secondary | ICD-10-CM | POA: Diagnosis not present

## 2017-12-24 ENCOUNTER — Other Ambulatory Visit: Payer: Self-pay | Admitting: Physical Medicine & Rehabilitation

## 2017-12-24 DIAGNOSIS — G620 Drug-induced polyneuropathy: Secondary | ICD-10-CM

## 2017-12-24 DIAGNOSIS — T451X5A Adverse effect of antineoplastic and immunosuppressive drugs, initial encounter: Secondary | ICD-10-CM

## 2017-12-24 DIAGNOSIS — G894 Chronic pain syndrome: Secondary | ICD-10-CM

## 2017-12-24 DIAGNOSIS — M62838 Other muscle spasm: Secondary | ICD-10-CM

## 2018-01-10 ENCOUNTER — Other Ambulatory Visit: Payer: Self-pay

## 2018-01-10 ENCOUNTER — Encounter: Payer: Self-pay | Admitting: Registered Nurse

## 2018-01-10 ENCOUNTER — Encounter: Payer: BLUE CROSS/BLUE SHIELD | Attending: Physical Medicine & Rehabilitation | Admitting: Registered Nurse

## 2018-01-10 VITALS — BP 138/84 | HR 86 | Ht 64.0 in | Wt 139.0 lb

## 2018-01-10 DIAGNOSIS — M62838 Other muscle spasm: Secondary | ICD-10-CM | POA: Diagnosis not present

## 2018-01-10 DIAGNOSIS — M5416 Radiculopathy, lumbar region: Secondary | ICD-10-CM | POA: Diagnosis not present

## 2018-01-10 DIAGNOSIS — G620 Drug-induced polyneuropathy: Secondary | ICD-10-CM | POA: Diagnosis not present

## 2018-01-10 DIAGNOSIS — Z79899 Other long term (current) drug therapy: Secondary | ICD-10-CM

## 2018-01-10 DIAGNOSIS — M545 Low back pain: Secondary | ICD-10-CM | POA: Insufficient documentation

## 2018-01-10 DIAGNOSIS — Z853 Personal history of malignant neoplasm of breast: Secondary | ICD-10-CM | POA: Diagnosis not present

## 2018-01-10 DIAGNOSIS — G8929 Other chronic pain: Secondary | ICD-10-CM | POA: Diagnosis not present

## 2018-01-10 DIAGNOSIS — G894 Chronic pain syndrome: Secondary | ICD-10-CM

## 2018-01-10 DIAGNOSIS — M25561 Pain in right knee: Secondary | ICD-10-CM

## 2018-01-10 DIAGNOSIS — M7061 Trochanteric bursitis, right hip: Secondary | ICD-10-CM

## 2018-01-10 DIAGNOSIS — Z5181 Encounter for therapeutic drug level monitoring: Secondary | ICD-10-CM

## 2018-01-10 DIAGNOSIS — M47816 Spondylosis without myelopathy or radiculopathy, lumbar region: Secondary | ICD-10-CM | POA: Insufficient documentation

## 2018-01-10 DIAGNOSIS — I890 Lymphedema, not elsewhere classified: Secondary | ICD-10-CM | POA: Insufficient documentation

## 2018-01-10 DIAGNOSIS — T451X5A Adverse effect of antineoplastic and immunosuppressive drugs, initial encounter: Secondary | ICD-10-CM

## 2018-01-10 DIAGNOSIS — Z9221 Personal history of antineoplastic chemotherapy: Secondary | ICD-10-CM | POA: Diagnosis not present

## 2018-01-10 MED ORDER — OXYCODONE-ACETAMINOPHEN 10-325 MG PO TABS: 1.0000 | ORAL_TABLET | Freq: Three times a day (TID) | ORAL | 0 refills | Status: DC | PRN

## 2018-01-10 MED ORDER — PREGABALIN 200 MG PO CAPS: 200.0000 mg | ORAL_CAPSULE | Freq: Two times a day (BID) | ORAL | 5 refills | Status: DC

## 2018-01-10 MED ORDER — FENTANYL 12 MCG/HR TD PT72: 12.5000 ug | MEDICATED_PATCH | TRANSDERMAL | 0 refills | Status: DC

## 2018-01-10 NOTE — Progress Notes (Signed)
Subjective:    Patient ID: Krista Doyle, female    DOB: 12/31/1960, 57 y.o.   MRN: [id number]  HPI: Krista Doyle is a 57 y.o. female who returns for follow up appointment for chronic pain and medication refill. She states her pain is located in her bilateral shoulders, lower back radiating into her right lower extremity and right hip pain. She  rates her pain 6. Her current exercise regime is walking and performing stretching exercises.  Krista Doyle Morphine equivalent is 73.80 MME. Last Oral Swab was Performed on 07/31/2017, it was consistent.   Pain Inventory Average Pain 6 Pain Right Now 6 My pain is aching  In the last 24 hours, has pain interfered with the following? General activity 6 Relation with others 5 Enjoyment of life 7 What TIME of day is your pain at its worst? evening night Sleep (in general) Fair  Pain is worse with: walking, bending, sitting and standing Pain improves with: rest and heat/ice Relief from Meds: 6  Mobility walk with assistance use a walker how many minutes can you walk? 10 ability to climb steps?  yes do you drive?  yes  Function disabled: date disabled n/a  Neuro/Psych numbness tingling  Prior Studies Any changes since last visit?  no  Physicians involved in your care Any changes since last visit?  no   Family History  Problem Relation Age of Onset  . Colon cancer Neg Hx    Social History   Socioeconomic History  . Marital status: Married    Spouse name: Not on file  . Number of children: Not on file  . Years of education: Not on file  . Highest education level: Not on file  Occupational History  . Occupation: Disabled  Social Needs  . Financial resource strain: Not on file  . Food insecurity:    Worry: Not on file    Inability: Not on file  . Transportation needs:    Medical: Not on file    Non-medical: Not on file  Tobacco Use  . Smoking status: Never Smoker  . Smokeless tobacco: Never  Used  Substance and Sexual Activity  . Alcohol use: No    Alcohol/week: 0.0 standard drinks  . Drug use: No  . Sexual activity: Yes  Lifestyle  . Physical activity:    Days per week: Not on file    Minutes per session: Not on file  . Stress: Not on file  Relationships  . Social connections:    Talks on phone: Not on file    Gets together: Not on file    Attends religious service: Not on file    Active member of club or organization: Not on file    Attends meetings of clubs or organizations: Not on file    Relationship status: Not on file  Other Topics Concern  . Not on file  Social History Narrative  . Not on file   Past Surgical History:  Procedure Laterality Date  . BACK SURGERY    . BREAST LUMPECTOMY    . COLONOSCOPY     about 3 yrs ago in [place]  . KNEE SURGERY     right  . TONSILLECTOMY     Past Medical History:  Diagnosis Date  . Asthma   . Back pain   . Breast cancer ([place]) 2008   left  . Diabetes ([place])   . Hypercholesterolemia   . Hypertension   . Neuropathy    extremities after  chemo  . Personal history of chemotherapy   . Personal history of radiation therapy 2008   BP 138/84   Pulse 86   Ht 5\' 4"  (1.626 m)   Wt 139 lb (63 kg)   SpO2 95%   BMI 23.86 kg/m   Opioid Risk Score:   Fall Risk Score:  `1  Depression screen PHQ 2/9  Depression screen [facility] 2/9 12/11/2017 11/07/2017 04/21/2017 01/13/2017 05/27/2016 03/28/2016 10/07/2015  Decreased Interest 0 0 - 0 0 0 0  Down, Depressed, Hopeless 0 0 - 0 0 0 0  PHQ - 2 Score 0 0 - 0 0 0 0  Altered sleeping - - 0 - - - -  Tired, decreased energy - - 0 - - - -  Change in appetite - - - - - - -  Feeling bad or failure about yourself  - - - - - - -  Trouble concentrating - - - - - - -  Moving slowly or fidgety/restless - - - - - - -  Suicidal thoughts - - - - - - -  PHQ-9 Score - - - - - - -  Difficult doing work/chores - - - - - - -    Review of Systems  Constitutional: Negative.   HENT:  Negative.   Eyes: Negative.   Respiratory: Negative.   Cardiovascular: Negative.   Gastrointestinal: Negative.   Endocrine: Negative.   Genitourinary: Negative.   Musculoskeletal: Negative.   Skin: Negative.   Allergic/Immunologic: Negative.   Neurological: Negative.   Hematological: Negative.   Psychiatric/Behavioral: Negative.   All other systems reviewed and are negative.      Objective:   Physical Exam  Constitutional: She is oriented to person, place, and time. She appears well-developed and well-nourished.  HENT:  Head: Normocephalic and atraumatic.  Neck: Normal range of motion. Neck supple.  Cardiovascular: Normal rate and regular rhythm.  Pulmonary/Chest: Effort normal and breath sounds normal.  Musculoskeletal:  Normal Muscle Bulk and Muscle Testing Reveals:  Upper Extremities: Right: Full ROM and Muscle Strength 5/5 Left: Decreased ROM 90 Degrees and Muscle Strength 4/5 Bilateral AC Joint Tenderness  Thoracic and Lumbar Hypersensitivity Right Greater Trochanter Tenderness Lower Extremities : Decreased ROM and Muscle Strength 4/5 Bilateral Lower Extremities Flexion Produces Pain into Lumbar Arises from Table with ease using cane for support Narrow Based Gait   Neurological: She is alert and oriented to person, place, and time.  Skin: Skin is warm and dry.  Psychiatric: She has a normal mood and affect. Her behavior is normal.  Nursing note and vitals reviewed.         Assessment & Plan:  1. Chronic low back pain, Lumbar Facet Arthropathy,lumbar spondylosis, spondylolisthesis, with lumbar radiculopathy.Continue medication regimen:01/10/2018 Refilled: Fentanyl 12 mcg one patch every three days #10 and Oxycodone 10/325 mg one tablet every 8 hours as needed for pain #90.  We will continue the opioid monitoring program, this consists of regular clinic visits, examinations, urine drug screen, pill counts as well as use of [place] Controlled Substance  reporting System. 2. Chemotherapy induced peripheral neuropathy:Continue Lyrica at200mg BID. 01/10/2018 3. Chronic Midline Thoracic Pain: Continue HEP and Current Medication Regime. 01/10/2018 4. Muscle Spasm: Continuemedication regimen withRobaxin12/12/2017 5. Right Knee Pain: Continue Current Medication Regime: Continue HEP as Tolerated. Continue to Monitor.01/10/2018 6. Right Greater Trochanteric Bursitis: Continue to Alternate Ice and Heat Therapy.01/10/2018   20 minutes of face to face patient care time was spent during this visit. All questions were  encouraged and answered.  F/U in 1 month

## 2018-02-06 DIAGNOSIS — J00 Acute nasopharyngitis [common cold]: Secondary | ICD-10-CM | POA: Diagnosis not present

## 2018-02-06 DIAGNOSIS — E1010 Type 1 diabetes mellitus with ketoacidosis without coma: Secondary | ICD-10-CM | POA: Diagnosis not present

## 2018-02-06 DIAGNOSIS — I10 Essential (primary) hypertension: Secondary | ICD-10-CM | POA: Diagnosis not present

## 2018-02-06 DIAGNOSIS — J4520 Mild intermittent asthma, uncomplicated: Secondary | ICD-10-CM | POA: Diagnosis not present

## 2018-02-06 DIAGNOSIS — E119 Type 2 diabetes mellitus without complications: Secondary | ICD-10-CM | POA: Diagnosis not present

## 2018-02-07 ENCOUNTER — Encounter: Payer: Self-pay | Admitting: Registered Nurse

## 2018-02-07 ENCOUNTER — Other Ambulatory Visit: Payer: Self-pay | Admitting: Physical Medicine & Rehabilitation

## 2018-02-07 ENCOUNTER — Encounter: Payer: BLUE CROSS/BLUE SHIELD | Attending: Physical Medicine & Rehabilitation | Admitting: Registered Nurse

## 2018-02-07 VITALS — BP 145/91 | HR 89 | Ht 64.0 in | Wt 137.0 lb

## 2018-02-07 DIAGNOSIS — M7061 Trochanteric bursitis, right hip: Secondary | ICD-10-CM | POA: Diagnosis not present

## 2018-02-07 DIAGNOSIS — M545 Low back pain: Secondary | ICD-10-CM | POA: Diagnosis not present

## 2018-02-07 DIAGNOSIS — G620 Drug-induced polyneuropathy: Secondary | ICD-10-CM | POA: Insufficient documentation

## 2018-02-07 DIAGNOSIS — Z79899 Other long term (current) drug therapy: Secondary | ICD-10-CM

## 2018-02-07 DIAGNOSIS — M5412 Radiculopathy, cervical region: Secondary | ICD-10-CM

## 2018-02-07 DIAGNOSIS — I890 Lymphedema, not elsewhere classified: Secondary | ICD-10-CM | POA: Diagnosis not present

## 2018-02-07 DIAGNOSIS — G894 Chronic pain syndrome: Secondary | ICD-10-CM

## 2018-02-07 DIAGNOSIS — Z5181 Encounter for therapeutic drug level monitoring: Secondary | ICD-10-CM | POA: Diagnosis not present

## 2018-02-07 DIAGNOSIS — M47816 Spondylosis without myelopathy or radiculopathy, lumbar region: Secondary | ICD-10-CM | POA: Insufficient documentation

## 2018-02-07 DIAGNOSIS — T451X5A Adverse effect of antineoplastic and immunosuppressive drugs, initial encounter: Secondary | ICD-10-CM | POA: Diagnosis not present

## 2018-02-07 DIAGNOSIS — Z9221 Personal history of antineoplastic chemotherapy: Secondary | ICD-10-CM | POA: Diagnosis not present

## 2018-02-07 DIAGNOSIS — Z853 Personal history of malignant neoplasm of breast: Secondary | ICD-10-CM | POA: Insufficient documentation

## 2018-02-07 DIAGNOSIS — M542 Cervicalgia: Secondary | ICD-10-CM | POA: Diagnosis not present

## 2018-02-07 DIAGNOSIS — M5416 Radiculopathy, lumbar region: Secondary | ICD-10-CM | POA: Diagnosis not present

## 2018-02-07 DIAGNOSIS — M62838 Other muscle spasm: Secondary | ICD-10-CM

## 2018-02-07 DIAGNOSIS — G8929 Other chronic pain: Secondary | ICD-10-CM | POA: Diagnosis not present

## 2018-02-07 MED ORDER — OXYCODONE-ACETAMINOPHEN 10-325 MG PO TABS: 1.0000 | ORAL_TABLET | Freq: Three times a day (TID) | ORAL | 0 refills | Status: DC | PRN

## 2018-02-07 MED ORDER — FENTANYL 12 MCG/HR TD PT72: 12.5000 ug | MEDICATED_PATCH | TRANSDERMAL | 0 refills | Status: DC

## 2018-02-07 NOTE — Progress Notes (Signed)
Subjective:    Patient ID: Krista Doyle, female    DOB: 1961-01-24, 58 y.o.   MRN: [id number]  HPI: Krista Doyle is a 58 y.o. female who returns for follow up appointment for chronic pain and medication refill. He states his pain is located in  Her neck radiating into her right shoulder, mid- lower back radiating into his right lower extremity and right hip pain. He rates his pain 6. His current exercise regime is walking and performing stretching exercises.  Ms. Daily Morphine equivalent is 73.80  MME.  Last Oral Swab was Performed 07/31/2017, it was consistent.   Pain Inventory Average Pain 6 Pain Right Now 6 My pain is constant  In the last 24 hours, has pain interfered with the following? General activity 7 Relation with others 6 Enjoyment of life 7 What TIME of day is your pain at its worst? evening, night  Sleep (in general) Fair  Pain is worse with: walking, bending, sitting and standing Pain improves with: rest and medication Relief from Meds: 7  Mobility walk with assistance use a cane use a walker ability to climb steps?  yes do you drive?  yes Do you have any goals in this area?  yes  Function disabled: date disabled .  Neuro/Psych weakness numbness tingling spasms  Prior Studies Any changes since last visit?  no  Physicians involved in your care Any changes since last visit?  no   Family History  Problem Relation Age of Onset  . Colon cancer Neg Hx    Social History   Socioeconomic History  . Marital status: Married    Spouse name: Not on file  . Number of children: Not on file  . Years of education: Not on file  . Highest education level: Not on file  Occupational History  . Occupation: Disabled  Social Needs  . Financial resource strain: Not on file  . Food insecurity:    Worry: Not on file    Inability: Not on file  . Transportation needs:    Medical: Not on file    Non-medical: Not on file  Tobacco Use  .  Smoking status: Never Smoker  . Smokeless tobacco: Never Used  Substance and Sexual Activity  . Alcohol use: No    Alcohol/week: 0.0 standard drinks  . Drug use: No  . Sexual activity: Yes  Lifestyle  . Physical activity:    Days per week: Not on file    Minutes per session: Not on file  . Stress: Not on file  Relationships  . Social connections:    Talks on phone: Not on file    Gets together: Not on file    Attends religious service: Not on file    Active member of club or organization: Not on file    Attends meetings of clubs or organizations: Not on file    Relationship status: Not on file  Other Topics Concern  . Not on file  Social History Narrative  . Not on file   Past Surgical History:  Procedure Laterality Date  . BACK SURGERY    . BREAST LUMPECTOMY    . COLONOSCOPY     about 3 yrs ago in [place]  . KNEE SURGERY     right  . TONSILLECTOMY     Past Medical History:  Diagnosis Date  . Asthma   . Back pain   . Breast cancer ([place]) 2008   left  . Diabetes ([place])   .  Hypercholesterolemia   . Hypertension   . 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 Personal history of chemotherapy   . Personal history of radiation therapy 2008   Ht 5\' 4"  (1.626 m)   Wt 137 lb (62.1 kg)   BMI 23.52 kg/m   Opioid Risk Score:   Fall Risk Score:  `1  Depression screen PHQ 2/9  Depression screen [facility] 2/9 12/11/2017 11/07/2017 04/21/2017 01/13/2017 05/27/2016 03/28/2016 10/07/2015  Decreased Interest 0 0 - 0 0 0 0  Down, Depressed, Hopeless 0 0 - 0 0 0 0  PHQ - 2 Score 0 0 - 0 0 0 0  Altered sleeping - - 0 - - - -  Tired, decreased energy - - 0 - - - -  Change in appetite - - - - - - -  Feeling bad or failure about yourself  - - - - - - -  Trouble concentrating - - - - - - -  Moving slowly or fidgety/restless - - - - - - -  Suicidal thoughts - - - - - - -  PHQ-9 Score - - - - - - -  Difficult doing work/chores - - - - - - -    Review of Systems  Constitutional:  Negative.   Eyes: Negative.   Respiratory: Positive for cough.   Cardiovascular: Positive for leg swelling.  Gastrointestinal: Negative.   Endocrine: Negative.   Genitourinary: Negative.   Musculoskeletal: Positive for arthralgias and back pain.       Spasms   Skin: Negative.   Allergic/Immunologic: Negative.   Neurological: Positive for weakness and numbness.       Tingling  Hematological: Negative.   Psychiatric/Behavioral: Negative.   All other systems reviewed and are negative.      Objective:   Physical Exam Vitals signs and nursing note reviewed.  Constitutional:      Appearance: Normal appearance.  Neck:     Musculoskeletal: Normal range of motion and neck supple.  Cardiovascular:     Rate and Rhythm: Normal rate and regular rhythm.     Pulses: Normal pulses.     Heart sounds: Normal heart sounds.  Musculoskeletal:     Comments: Normal Muscle Bulk and Muscle Testing Reveals:  Upper Extremities: Right: Full ROM and Muscle Strength 4/5 Left: Decreased ROM 45 Degrees and Muscle Strength 3/5   Thoracic Paraspinal Tenderness: T-1-T-3 Mainly Right Side Lumbar Hypersensitivity Lower Extremities: Right: Decreased ROM and Muscle Strength 4/5  Right Lower Extremity Flexion Produces Pain into Extremity Left: Full ROM and Muscle Strength 5/5 Arises from Table slowly using cane for support Antalgic Gait   Skin:    General: Skin is warm and dry.  Neurological:     Mental Status: She is alert and oriented to person, place, and time.  Psychiatric:        Mood and Affect: Mood normal.        Behavior: Behavior normal.           Assessment & Plan:  1. Chronic low back pain, Lumbar Facet Arthropathy,lumbar spondylosis, spondylolisthesis, with lumbar radiculopathy.Continue medication regimen:02/07/2018 Refilled: Fentanyl 12 mcg one patch every three days #10 and Oxycodone 10/325 mg one tablet every 8 hours as needed for pain #90.  We will continue the opioid monitoring  program, this consists of regular clinic visits, examinations, urine drug screen, pill counts as well as use of [place] Controlled Substance reporting System. 2. Chemotherapy induced peripheral neuropathy:Continue Lyrica at200mg BID.02/07/2018 3.  Chronic Midline Thoracic Pain: Continue HEP and Current Medication Regime.02/07/2018 4. Muscle Spasm: Continuemedication regimen withRobaxin01/09/2018 5. Right Knee Pain:No complaints today. Continue Current Medication Regime: Continue HEP as Tolerated. Continue to Monitor.02/07/2018 6. Right Greater Trochanteric Bursitis: Continue to Alternate Ice and Heat Therapy.02/07/2018   20 minutes of face to face patient care time was spent during this visit. All questions were encouraged and answered.  F/U in 1 month

## 2018-03-07 ENCOUNTER — Encounter: Payer: Self-pay | Admitting: Registered Nurse

## 2018-03-07 ENCOUNTER — Ambulatory Visit: Payer: BLUE CROSS/BLUE SHIELD | Admitting: Registered Nurse

## 2018-03-07 ENCOUNTER — Encounter: Payer: BLUE CROSS/BLUE SHIELD | Attending: Physical Medicine & Rehabilitation | Admitting: Registered Nurse

## 2018-03-07 VITALS — BP 123/90 | HR 91 | Ht 64.0 in | Wt 136.0 lb

## 2018-03-07 DIAGNOSIS — Z5181 Encounter for therapeutic drug level monitoring: Secondary | ICD-10-CM

## 2018-03-07 DIAGNOSIS — G894 Chronic pain syndrome: Secondary | ICD-10-CM | POA: Diagnosis not present

## 2018-03-07 DIAGNOSIS — Z853 Personal history of malignant neoplasm of breast: Secondary | ICD-10-CM | POA: Insufficient documentation

## 2018-03-07 DIAGNOSIS — G620 Drug-induced polyneuropathy: Secondary | ICD-10-CM | POA: Diagnosis not present

## 2018-03-07 DIAGNOSIS — M5412 Radiculopathy, cervical region: Secondary | ICD-10-CM

## 2018-03-07 DIAGNOSIS — T451X5A Adverse effect of antineoplastic and immunosuppressive drugs, initial encounter: Secondary | ICD-10-CM

## 2018-03-07 DIAGNOSIS — Z9221 Personal history of antineoplastic chemotherapy: Secondary | ICD-10-CM | POA: Diagnosis not present

## 2018-03-07 DIAGNOSIS — M545 Low back pain: Secondary | ICD-10-CM | POA: Diagnosis not present

## 2018-03-07 DIAGNOSIS — M62838 Other muscle spasm: Secondary | ICD-10-CM | POA: Diagnosis not present

## 2018-03-07 DIAGNOSIS — M542 Cervicalgia: Secondary | ICD-10-CM | POA: Diagnosis not present

## 2018-03-07 DIAGNOSIS — G8929 Other chronic pain: Secondary | ICD-10-CM | POA: Diagnosis not present

## 2018-03-07 DIAGNOSIS — I890 Lymphedema, not elsewhere classified: Secondary | ICD-10-CM | POA: Diagnosis not present

## 2018-03-07 DIAGNOSIS — M5416 Radiculopathy, lumbar region: Secondary | ICD-10-CM | POA: Diagnosis not present

## 2018-03-07 DIAGNOSIS — Z79899 Other long term (current) drug therapy: Secondary | ICD-10-CM | POA: Diagnosis not present

## 2018-03-07 DIAGNOSIS — M47816 Spondylosis without myelopathy or radiculopathy, lumbar region: Secondary | ICD-10-CM | POA: Diagnosis not present

## 2018-03-07 DIAGNOSIS — M7061 Trochanteric bursitis, right hip: Secondary | ICD-10-CM

## 2018-03-07 MED ORDER — OXYCODONE-ACETAMINOPHEN 10-325 MG PO TABS: 1.0000 | ORAL_TABLET | Freq: Three times a day (TID) | ORAL | 0 refills | Status: DC | PRN

## 2018-03-07 MED ORDER — FENTANYL 12 MCG/HR TD PT72: 1.0000 | MEDICATED_PATCH | TRANSDERMAL | 0 refills | Status: DC

## 2018-03-07 MED ORDER — PREGABALIN 150 MG PO CAPS: 150.0000 mg | ORAL_CAPSULE | Freq: Two times a day (BID) | ORAL | 2 refills | Status: DC

## 2018-03-07 NOTE — Progress Notes (Signed)
Subjective:    Patient ID: Krista Doyle, female    DOB: March 27, 1960, 58 y.o.   MRN: [id number]  HPI: Krista Doyle is a 57 y.o. female who returns for follow up appointment for chronic pain and medication refill. She states her pain is located in her mid- lower back radiating into her right hip and right lower extremity. She rates her pain 6. Her current exercise regime is walking and performing stretching exercises.  Krista Doyle also reports she experiencing daytime somnolence with current Lyrica dosage, Lyrica decreased. She's currently fosteing two children she states.    Krista Doyle Morphine equivalent is 73.80 MME. Last Oral swab was performed on 07/31/2017, it was consistent.    Pain Inventory Average Pain 7 Pain Right Now 6 My pain is constant, sharp, tingling and aching  In the last 24 hours, has pain interfered with the following? General activity 7 Relation with others 8 Enjoyment of life 8 What TIME of day is your pain at its worst? night Sleep (in general) Fair  Pain is worse with: walking, bending, sitting and standing Pain improves with: rest and medication Relief from Meds: 7  Mobility walk with assistance use a cane use a walker ability to climb steps?  yes do you drive?  yes  Function disabled: date disabled .  Neuro/Psych weakness numbness tingling trouble walking spasms  Prior Studies Any changes since last visit?  no  Physicians involved in your care Any changes since last visit?  no   Family History  Problem Relation Age of Onset  . Colon cancer Neg Hx    Social History   Socioeconomic History  . Marital status: Married    Spouse name: Not on file  . Number of children: Not on file  . Years of education: Not on file  . Highest education level: Not on file  Occupational History  . Occupation: Disabled  Social Needs  . Financial resource strain: Not on file  . Food insecurity:    Worry: Not on file   Inability: Not on file  . Transportation needs:    Medical: Not on file    Non-medical: Not on file  Tobacco Use  . Smoking status: Never Smoker  . Smokeless tobacco: Never Used  Substance and Sexual Activity  . Alcohol use: No    Alcohol/week: 0.0 standard drinks  . Drug use: No  . Sexual activity: Yes  Lifestyle  . Physical activity:    Days per week: Not on file    Minutes per session: Not on file  . Stress: Not on file  Relationships  . Social connections:    Talks on phone: Not on file    Gets together: Not on file    Attends religious service: Not on file    Active member of club or organization: Not on file    Attends meetings of clubs or organizations: Not on file    Relationship status: Not on file  Other Topics Concern  . Not on file  Social History Narrative  . Not on file   Past Surgical History:  Procedure Laterality Date  . BACK SURGERY    . BREAST LUMPECTOMY    . COLONOSCOPY     about 3 yrs ago in [place]  . KNEE SURGERY     right  . TONSILLECTOMY     Past Medical History:  Diagnosis Date  . Asthma   . Back pain   . Breast cancer [facility]) 2008  left  . Diabetes ([place])   . Hypercholesterolemia   . Hypertension   . 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 Personal history of chemotherapy   . Personal history of radiation therapy 2008   BP 123/90   Pulse 91   Ht 5\' 4"  (1.626 m)   Wt 136 lb (61.7 kg)   SpO2 97%   BMI 23.34 kg/m   Opioid Risk Score:   Fall Risk Score:  `1  Depression screen PHQ 2/9  Depression screen [facility] 2/9 12/11/2017 11/07/2017 04/21/2017 01/13/2017 05/27/2016 03/28/2016 10/07/2015  Decreased Interest 0 0 - 0 0 0 0  Down, Depressed, Hopeless 0 0 - 0 0 0 0  PHQ - 2 Score 0 0 - 0 0 0 0  Altered sleeping - - 0 - - - -  Tired, decreased energy - - 0 - - - -  Change in appetite - - - - - - -  Feeling bad or failure about yourself  - - - - - - -  Trouble concentrating - - - - - - -  Moving slowly or fidgety/restless - - - -  - - -  Suicidal thoughts - - - - - - -  PHQ-9 Score - - - - - - -  Difficult doing work/chores - - - - - - -     Review of Systems  Constitutional: Negative.   HENT: Negative.   Eyes: Negative.   Respiratory: Negative.   Cardiovascular: Negative.   Gastrointestinal: Negative.   Endocrine: Negative.   Genitourinary: Negative.   Musculoskeletal: Positive for arthralgias, back pain, gait problem, joint swelling and myalgias.  Skin: Negative.   Allergic/Immunologic: Negative.   Neurological: Positive for weakness and numbness.  Hematological: Negative.   Psychiatric/Behavioral: Negative.   All other systems reviewed and are negative.      Objective:   Physical Exam Vitals signs and nursing note reviewed.  Constitutional:      Appearance: Normal appearance.  Neck:     Musculoskeletal: Normal range of motion and neck supple.  Cardiovascular:     Rate and Rhythm: Normal rate and regular rhythm.     Pulses: Normal pulses.     Heart sounds: Normal heart sounds.  Pulmonary:     Effort: Pulmonary effort is normal.     Breath sounds: Normal breath sounds.  Musculoskeletal:     Comments: Normal Muscle Bulk and Muscle Testing Reveals:  Upper Extremities: Right: Decreased ROM 90 degrees  and Muscle Strength 4/5 and Left: Decreased ROM 45 Degrees and Muscle Strength 4/5 Lumbar Hypersensitivity Right Greater Trochanter Tenderness Lower Extremities: Right: Decreased ROM and Muscle Strength 4/5 Right Lower Extremity Flexion Produces pain into Right hip and right lower extremity Left: Full ROM and Muscle Strength 5/5 Left lower extremity flexion produces pain into extremity Arises from table slowly using cane for support Antalgic Gait  Gait   Skin:    General: Skin is warm and dry.  Neurological:     Mental Status: She is alert and oriented to person, place, and time.  Psychiatric:        Mood and Affect: Mood normal.        Behavior: Behavior normal.           Assessment  & Plan:  1. Chronic low back pain, Lumbar Facet Arthropathy,lumbar spondylosis, spondylolisthesis, with lumbar radiculopathy.Continue current medication regimen:03/07/2018 Refilled: Fentanyl 12 mcg one patch every three days #10 and Oxycodone 10/325 mg one tablet every 8  hours as needed for pain #90.  We will continue the opioid monitoring program, this consists of regular clinic visits, examinations, urine drug screen, pill counts as well as use of [place] Controlled Substance reporting System. 2. Chemotherapy induced peripheral neuropathy:Decreased  Lyrica, due to daytime somnolence at150mg BID.03/07/2018 3. Chronic Midline Thoracic Pain: Continue HEP and Current Medication Regime.03/07/2018 4. Muscle Spasm: Continuemedication regimen withRobaxin02/06/2018 5. Right Knee Pain:No complaints today. Continue Current Medication Regime: Continue HEP as Tolerated. Continue to Monitor.03/07/2018 6. Right Greater Trochanteric Bursitis: Continue to Alternate Ice and Heat Therapy.03/07/2018 7. Chronic Pain Syndrome: Continue Amitriptyline, Fentanyly and  Oxycodone. Continue to Monitor.   20 minutes of face to face patient care time was spent during this visit. All questions were encouraged and answered.  F/U in 1 month

## 2018-03-11 LAB — DRUG TOX MONITOR 1 W/CONF, ORAL FLD
Amphetamines: NEGATIVE ng/mL (ref <10)
Barbiturates: NEGATIVE ng/mL (ref <10)
Benzodiazepines: NEGATIVE ng/mL (ref <0.50)
Buprenorphine: NEGATIVE ng/mL (ref <0.10)
Cocaine: NEGATIVE ng/mL (ref <5.0)
Codeine: NEGATIVE ng/mL (ref <2.5)
Dihydrocodeine: NEGATIVE ng/mL (ref <2.5)
Fentanyl: 5.33 ng/mL — ABNORMAL HIGH (ref <0.10)
Fentanyl: POSITIVE ng/mL — AB (ref <0.10)
Heroin Metabolite: NEGATIVE ng/mL (ref <1.0)
Hydrocodone: NEGATIVE ng/mL (ref <2.5)
Hydromorphone: NEGATIVE ng/mL (ref <2.5)
MARIJUANA: NEGATIVE ng/mL (ref <2.5)
MDMA: NEGATIVE ng/mL (ref <10)
Meprobamate: NEGATIVE ng/mL (ref <2.5)
Methadone: NEGATIVE ng/mL (ref <5.0)
Morphine: NEGATIVE ng/mL (ref <2.5)
Nicotine Metabolite: NEGATIVE ng/mL (ref <5.0)
Norhydrocodone: NEGATIVE ng/mL (ref <2.5)
Noroxycodone: 20.5 ng/mL — ABNORMAL HIGH (ref <2.5)
Opiates: POSITIVE ng/mL — AB (ref <2.5)
Oxycodone: 130.6 ng/mL — ABNORMAL HIGH (ref <2.5)
Oxymorphone: NEGATIVE ng/mL (ref <2.5)
Phencyclidine: NEGATIVE ng/mL (ref <10)
Tapentadol: NEGATIVE ng/mL (ref <5.0)
Tramadol: NEGATIVE ng/mL (ref <5.0)
Zolpidem: NEGATIVE ng/mL (ref <5.0)

## 2018-03-11 LAB — DRUG TOX ALC METAB W/CON, ORAL FLD: Alcohol Metabolite: NEGATIVE ng/mL (ref <25)

## 2018-03-12 DIAGNOSIS — E1140 Type 2 diabetes mellitus with diabetic neuropathy, unspecified: Secondary | ICD-10-CM | POA: Diagnosis not present

## 2018-03-12 DIAGNOSIS — B351 Tinea unguium: Secondary | ICD-10-CM | POA: Diagnosis not present

## 2018-03-14 ENCOUNTER — Ambulatory Visit (HOSPITAL_COMMUNITY)
Admission: RE | Admit: 2018-03-14 | Discharge: 2018-03-14 | Disposition: A | Discharge status: Home / Self Care | Payer: BLUE CROSS/BLUE SHIELD | Source: Ambulatory Visit | Attending: Internal Medicine | Admitting: Internal Medicine

## 2018-03-14 ENCOUNTER — Telehealth: Payer: Self-pay | Admitting: *Deleted

## 2018-03-14 ENCOUNTER — Other Ambulatory Visit (HOSPITAL_COMMUNITY): Payer: Self-pay | Admitting: Internal Medicine

## 2018-03-14 ENCOUNTER — Ambulatory Visit (HOSPITAL_COMMUNITY): Payer: BLUE CROSS/BLUE SHIELD

## 2018-03-14 DIAGNOSIS — Z170 Estrogen receptor positive status [ER+]: Secondary | ICD-10-CM | POA: Insufficient documentation

## 2018-03-14 DIAGNOSIS — Z1231 Encounter for screening mammogram for malignant neoplasm of breast: Secondary | ICD-10-CM | POA: Diagnosis not present

## 2018-03-14 DIAGNOSIS — M8580 Other specified disorders of bone density and structure, unspecified site: Secondary | ICD-10-CM | POA: Diagnosis not present

## 2018-03-14 DIAGNOSIS — C50412 Malignant neoplasm of upper-outer quadrant of left female breast: Secondary | ICD-10-CM | POA: Diagnosis not present

## 2018-03-14 NOTE — Telephone Encounter (Signed)
Oral swab drug screen was consistent for prescribed medications.  ?

## 2018-03-18 ENCOUNTER — Other Ambulatory Visit: Payer: Self-pay | Admitting: Physical Medicine & Rehabilitation

## 2018-03-18 DIAGNOSIS — G620 Drug-induced polyneuropathy: Secondary | ICD-10-CM

## 2018-03-18 DIAGNOSIS — G894 Chronic pain syndrome: Secondary | ICD-10-CM

## 2018-03-18 DIAGNOSIS — T451X5A Adverse effect of antineoplastic and immunosuppressive drugs, initial encounter: Secondary | ICD-10-CM

## 2018-03-21 ENCOUNTER — Other Ambulatory Visit: Payer: Self-pay | Admitting: Physical Medicine & Rehabilitation

## 2018-03-21 DIAGNOSIS — M62838 Other muscle spasm: Secondary | ICD-10-CM

## 2018-03-26 ENCOUNTER — Inpatient Hospital Stay (HOSPITAL_BASED_OUTPATIENT_CLINIC_OR_DEPARTMENT_OTHER): Payer: BLUE CROSS/BLUE SHIELD | Admitting: Internal Medicine

## 2018-03-26 ENCOUNTER — Inpatient Hospital Stay (HOSPITAL_COMMUNITY): Payer: BLUE CROSS/BLUE SHIELD | Attending: Hematology

## 2018-03-26 ENCOUNTER — Other Ambulatory Visit: Payer: Self-pay

## 2018-03-26 ENCOUNTER — Inpatient Hospital Stay (HOSPITAL_COMMUNITY): Payer: BLUE CROSS/BLUE SHIELD

## 2018-03-26 ENCOUNTER — Encounter (HOSPITAL_COMMUNITY): Payer: Self-pay | Admitting: Internal Medicine

## 2018-03-26 VITALS — BP 123/76 | HR 92 | Temp 98.4°F | Resp 16 | Wt 140.0 lb

## 2018-03-26 DIAGNOSIS — Z79899 Other long term (current) drug therapy: Secondary | ICD-10-CM

## 2018-03-26 DIAGNOSIS — M545 Low back pain, unspecified: Secondary | ICD-10-CM

## 2018-03-26 DIAGNOSIS — C50412 Malignant neoplasm of upper-outer quadrant of left female breast: Secondary | ICD-10-CM

## 2018-03-26 DIAGNOSIS — M8580 Other specified disorders of bone density and structure, unspecified site: Secondary | ICD-10-CM

## 2018-03-26 DIAGNOSIS — Z170 Estrogen receptor positive status [ER+]: Secondary | ICD-10-CM | POA: Diagnosis not present

## 2018-03-26 DIAGNOSIS — Z923 Personal history of irradiation: Secondary | ICD-10-CM | POA: Diagnosis not present

## 2018-03-26 DIAGNOSIS — Z9221 Personal history of antineoplastic chemotherapy: Secondary | ICD-10-CM | POA: Diagnosis not present

## 2018-03-26 DIAGNOSIS — D241 Benign neoplasm of right breast: Secondary | ICD-10-CM | POA: Diagnosis not present

## 2018-03-26 DIAGNOSIS — G8929 Other chronic pain: Secondary | ICD-10-CM

## 2018-03-26 DIAGNOSIS — Z79811 Long term (current) use of aromatase inhibitors: Secondary | ICD-10-CM | POA: Diagnosis not present

## 2018-03-26 LAB — COMPREHENSIVE METABOLIC PANEL
ALT: 16 U/L (ref 0–44)
AST: 20 U/L (ref 15–41)
Albumin: 3.7 g/dL (ref 3.5–5.0)
Alkaline Phosphatase: 53 U/L (ref 38–126)
Anion gap: 8 (ref 5–15)
BUN: 16 mg/dL (ref 6–20)
CO2: 28 mmol/L (ref 22–32)
Calcium: 9.7 mg/dL (ref 8.9–10.3)
Chloride: 101 mmol/L (ref 98–111)
Creatinine, Ser: 0.69 mg/dL (ref 0.44–1.00)
GFR calc Af Amer: >60 mL/min (ref >60)
GFR calc non Af Amer: >60 mL/min (ref >60)
Glucose, Bld: 89 mg/dL (ref 70–99)
Potassium: 3.8 mmol/L (ref 3.5–5.1)
Sodium: 137 mmol/L (ref 135–145)
Total Bilirubin: 0.3 mg/dL (ref 0.3–1.2)
Total Protein: 7.5 g/dL (ref 6.5–8.1)

## 2018-03-26 LAB — CBC WITH DIFFERENTIAL/PLATELET
Abs Immature Granulocytes: 0.02 10*3/uL (ref 0.00–0.07)
Basophils Absolute: 0 10*3/uL (ref 0.0–0.1)
Basophils Relative: 1 %
Eosinophils Absolute: 0.2 10*3/uL (ref 0.0–0.5)
Eosinophils Relative: 2 %
HCT: 38.9 % (ref 36.0–46.0)
Hemoglobin: 11.6 g/dL — ABNORMAL LOW (ref 12.0–15.0)
Immature Granulocytes: 0 %
Lymphocytes Relative: 38 %
Lymphs Abs: 2.8 10*3/uL (ref 0.7–4.0)
MCH: 25.1 pg — ABNORMAL LOW (ref 26.0–34.0)
MCHC: 29.8 g/dL — ABNORMAL LOW (ref 30.0–36.0)
MCV: 84.2 fL (ref 80.0–100.0)
Monocytes Absolute: 0.8 10*3/uL (ref 0.1–1.0)
Monocytes Relative: 10 %
Neutro Abs: 3.7 10*3/uL (ref 1.7–7.7)
Neutrophils Relative %: 49 %
Platelets: 243 10*3/uL (ref 150–400)
RBC: 4.62 MIL/uL (ref 3.87–5.11)
RDW: 13.8 % (ref 11.5–15.5)
WBC: 7.5 10*3/uL (ref 4.0–10.5)
nRBC: 0 % (ref 0.0–0.2)

## 2018-03-26 LAB — LACTATE DEHYDROGENASE: LDH: 136 U/L (ref 98–192)

## 2018-03-26 MED ORDER — DENOSUMAB 60 MG/ML ~~LOC~~ SOSY
60.0000 mg | PREFILLED_SYRINGE | Freq: Once | SUBCUTANEOUS | Status: AC
  Administered 2018-03-26: 60 mg via SUBCUTANEOUS
  Filled 2018-03-26: qty 1

## 2018-03-26 NOTE — Progress Notes (Signed)
Diagnosis No diagnosis found.  Staging Cancer Staging No matching staging information was found for the patient.  Assessment and Plan:   1.   Left Breast Cancer  ER+, PR+, HER-2+. Reportedly completed one year of Herceptin.  Original breast cancer was diagnosed in 2008.  She took tamoxifen for 5 years.  She was previously followed by Dr. Talbert Cage and was planned to complete 5 years of Femara in 2018.  Patient reports she remains on Femara therapy.  Bilateral screening mammogram that was done 03/14/2018 was negative.  Repeat imaging in 03/2019.  Records of original diagnosis and treatments to determine if pt has completed 5 years of therapy with Tamoxifen are unavailable for review.  Will continue Femara if tolerating.   Pt will RTC in 09/2018.    Labs done 03/26/2018 reviewed and showed WBC 7.5 HB 11.6 plts 243,000.  Chemistries WNL with K+ 3.8 Cr 0.69 and normal LFTs.   Screening mammogram done 03/14/2018 was negative.  She will have repeat imaging in 03/2019.   2.  Decreased ROM of left arm.  Pt is set up for Lymphedema and PT evaluation.    3.  Back pain.  This has been a chronic problem.  Bone scan done 05/10/2017 showed question of compression fractures.  She has undergone MRI of the spine 10/17/2014 that showed spinal stenosis.  She underwent Thoracic x-rays as well as sternal x-rays  09/20/2017 that showed DJD and osteopenia.  She is being followed by pain management and may need orthopedic evaluation if ongoing symptoms.  SPEP pending.     4.  Osteopenia.  She had a bone density done on 03/08/2017 that showed osteopenia.  She is being treated with Prolia every 6 months.  She will have repeat BMD in 03/2019.   Continue Ca++ and vitamin D.   5.  Right breast fibroadenoma.  This is considered a benign breast lesion.  Bilateral screening mammogram that was done on 03/14/2018 was negative.  She will have bilateral screening mammogram in 03/2019 and will follow-up at that time to go over results.     6.  Health  maintenance.  GI follow-up as recommended.  25 minutes spent with more than 50% spent in counseling and coordination of care.     Current Status: Patient is here today for follow-up.  She is here to go over labs and mammogram.  She continues to report back pain.  She remains on Femara. She reports decreased ROM left arm.    Problem List Patient Active Problem List   Diagnosis Date Noted  . Lumbar facet arthropathy [M47.816] 10/07/2015  . 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 Constipation due to opioid therapy [K59.03, T40.2X5A] 05/08/2015  . Genetic testing [Z13.79] 03/17/2015  . Osteopenia determined by x-ray [M85.80] 03/13/2015  . Lumbar spondylosis [M47.816] 02/11/2015  . Lumbar radiculopathy [M54.16] 02/11/2015  . Chemotherapy-induced peripheral neuropathy ([place]) [G62.0, T45.1X5A] 02/11/2015  . Vitamin D deficiency [E55.9] 02/02/2015  . Breast cancer of upper-outer quadrant of left female breast ([place]) [C50.412] 12/22/2014  . High risk medication use [Z79.899] 12/22/2014    Past Medical History Past Medical History:  Diagnosis Date  . Asthma   . Back pain   . Breast cancer ([place]) 2008   left  . Diabetes ([place])   . Hypercholesterolemia   . Hypertension   . 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 Personal history of chemotherapy   . Personal history of radiation therapy 2008    Past Surgical History Past  Surgical History:  Procedure Laterality Date  . BACK SURGERY    . BREAST LUMPECTOMY    . COLONOSCOPY     about 3 yrs ago in [place]  . KNEE SURGERY     right  . TONSILLECTOMY      Family History Family History  Problem Relation Age of Onset  . Colon cancer Neg Hx      Social History  reports that she has never smoked. She has never used smokeless tobacco. She reports that she does not drink alcohol or use drugs.  Medications  Current Outpatient Medications:  .  albuterol (PROVENTIL HFA;VENTOLIN HFA) 108 (90 Base) MCG/ACT inhaler, Inhale 1 puff  into the lungs every 6 (six) hours as needed for wheezing or shortness of breath., Disp: , Rfl:  .  amitriptyline (ELAVIL) 10 MG tablet, TAKE 1 TO 2 TABLETS BY MOUTH EVERY DAY AT BEDTIME, Disp: 30 tablet, Rfl: 2 .  atorvastatin (LIPITOR) 20 MG tablet, Take 20 mg by mouth daily., Disp: , Rfl:  .  budesonide-formoterol (SYMBICORT) 160-4.5 MCG/ACT inhaler, Inhale 2 puffs into the lungs 2 (two) times daily., Disp: , Rfl:  .  calcium carbonate (OS-CAL - DOSED IN MG OF ELEMENTAL CALCIUM) 1250 (500 Ca) MG tablet, Take 1 tablet by mouth daily with breakfast., Disp: , Rfl:  .  ergocalciferol (VITAMIN D2) 50000 units capsule, Take 50,000 Units by mouth once a week., Disp: , Rfl:  .  fentaNYL (DURAGESIC) 12 MCG/HR, Place 1 patch onto the skin every 3 (three) days., Disp: 10 patch, Rfl: 0 .  insulin glargine (LANTUS) 100 UNIT/ML injection, Inject 24 Units into the skin at bedtime., Disp: , Rfl:  .  letrozole (FEMARA) 2.5 MG tablet, Take 2.5 mg by mouth daily., Disp: , Rfl:  .  loratadine (CLARITIN) 10 MG tablet, Take 10 mg by mouth daily as needed for allergies., Disp: , Rfl:  .  losartan (COZAAR) 100 MG tablet, Take 100 mg daily by mouth., Disp: , Rfl:  .  methocarbamol (ROBAXIN) 500 MG tablet, TAKE 1 TABLET BY MOUTH EVERY 6 HOURS AS NEEDED FOR MUSCLE SPASMS., Disp: 60 tablet, Rfl: 2 .  Multiple Vitamins-Minerals (ICAPS AREDS 2) CAPS, Take by mouth., Disp: , Rfl:  .  naloxegol oxalate (MOVANTIK) 25 MG TABS tablet, Take 1 tablet (25 mg total) by mouth daily., Disp: 30 tablet, Rfl: 3 .  oxyCODONE-acetaminophen (PERCOCET) 10-325 MG tablet, Take 1 tablet by mouth every 8 (eight) hours as needed for pain., Disp: 90 tablet, Rfl: 0 .  pregabalin (LYRICA) 150 MG capsule, Take 1 capsule (150 mg total) by mouth 2 (two) times daily., Disp: 60 capsule, Rfl: 2 .  sitaGLIPtin (JANUVIA) 100 MG tablet, Take 100 mg by mouth daily., Disp: , Rfl:  .  valsartan (DIOVAN) 160 MG tablet, Take 160 mg by mouth daily., Disp: , Rfl:    Allergies Other and Pollen extract  Review of Systems Review of Systems - Oncology ROS negative   Physical Exam  Vitals Wt Readings from Last 3 Encounters:  03/26/18 140 lb (63.5 kg)  03/07/18 136 lb (61.7 kg)  02/07/18 137 lb (62.1 kg)   Temp Readings from Last 3 Encounters:  03/26/18 98.4 F (36.9 C) (Oral)  09/20/17 98.2 F (36.8 C) (Oral)  05/03/17 98.4 F (36.9 C)   BP Readings from Last 3 Encounters:  03/26/18 123/76  03/07/18 123/90  02/07/18 (!) 145/91   Pulse Readings from Last 3 Encounters:  03/26/18 92  03/07/18 91  02/07/18 89  Constitutional: Well-developed, well-nourished, and in no distress.   HENT: Head: Normocephalic and atraumatic.  Mouth/Throat: No oropharyngeal exudate. Mucosa moist. Eyes: Pupils are equal, round, and reactive to light. Conjunctivae are normal. No scleral icterus.  Neck: Normal range of motion. Neck supple. No JVD present.  Cardiovascular: Normal rate, regular rhythm and normal heart sounds.  Exam reveals no gallop and no friction rub.   No murmur heard. Pulmonary/Chest: Effort normal and breath sounds normal. No respiratory distress. No wheezes.No rales.  Abdominal: Soft. Bowel sounds are normal. No distension. There is no tenderness. There is no guarding.  Musculoskeletal: No edema or tenderness.  Lymphadenopathy: No cervical, axillary or supraclavicular adenopathy.  Neurological: Alert and oriented to person, place, and time. No cranial nerve deficit.  Skin: Skin is warm and dry. No rash noted. No erythema. No pallor.  Psychiatric: Affect and judgment normal.  Breast exam:  Chaperone present.  Left lumpectomy changes noted.  No dominant masses palpable bilaterally.    Labs Appointment on 03/26/2018  Component Date Value Ref Range Status  . WBC 03/26/2018 7.5  4.0 - 10.5 K/uL Final  . RBC 03/26/2018 4.62  3.87 - 5.11 MIL/uL Final  . Hemoglobin 03/26/2018 11.6* 12.0 - 15.0 g/dL Final  . HCT 03/26/2018 38.9  36.0 - 46.0  % Final  . MCV 03/26/2018 84.2  80.0 - 100.0 fL Final  . MCH 03/26/2018 25.1* 26.0 - 34.0 pg Final  . MCHC 03/26/2018 29.8* 30.0 - 36.0 g/dL Final  . RDW 03/26/2018 13.8  11.5 - 15.5 % Final  . Platelets 03/26/2018 243  150 - 400 K/uL Final  . nRBC 03/26/2018 0.0  0.0 - 0.2 % Final  . Neutrophils Relative % 03/26/2018 49  % Final  . Neutro Abs 03/26/2018 3.7  1.7 - 7.7 K/uL Final  . Lymphocytes Relative 03/26/2018 38  % Final  . Lymphs Abs 03/26/2018 2.8  0.7 - 4.0 K/uL Final  . Monocytes Relative 03/26/2018 10  % Final  . Monocytes Absolute 03/26/2018 0.8  0.1 - 1.0 K/uL Final  . Eosinophils Relative 03/26/2018 2  % Final  . Eosinophils Absolute 03/26/2018 0.2  0.0 - 0.5 K/uL Final  . Basophils Relative 03/26/2018 1  % Final  . Basophils Absolute 03/26/2018 0.0  0.0 - 0.1 K/uL Final  . Immature Granulocytes 03/26/2018 0  % Final  . Abs Immature Granulocytes 03/26/2018 0.02  0.00 - 0.07 K/uL Final   Performed at [facility], [address] [place], [place] [address]  . Sodium 03/26/2018 137  135 - 145 mmol/L Final  . Potassium 03/26/2018 3.8  3.5 - 5.1 mmol/L Final  . Chloride 03/26/2018 101  98 - 111 mmol/L Final  . CO2 03/26/2018 28  22 - 32 mmol/L Final  . Glucose, Bld 03/26/2018 89  70 - 99 mg/dL Final  . BUN 03/26/2018 16  6 - 20 mg/dL Final  . Creatinine, Ser 03/26/2018 0.69  0.44 - 1.00 mg/dL Final  . Calcium 03/26/2018 9.7  8.9 - 10.3 mg/dL Final  . Total Protein 03/26/2018 7.5  6.5 - 8.1 g/dL Final  . Albumin 03/26/2018 3.7  3.5 - 5.0 g/dL Final  . AST 03/26/2018 20  15 - 41 U/L Final  . ALT 03/26/2018 16  0 - 44 U/L Final  . Alkaline Phosphatase 03/26/2018 53  38 - 126 U/L Final  . Total Bilirubin 03/26/2018 0.3  0.3 - 1.2 mg/dL Final  . GFR calc non Af Amer 03/26/2018 >60  >60 mL/min  Final  . GFR calc Af Amer 03/26/2018 >60  >60 mL/min Final  . Anion gap 03/26/2018 8  5 - 15 Final   Performed at [facility], [address], [place], [place] [address]  . LDH  03/26/2018 136  98 - 192 U/L Final   Performed at [facility], [address] [place], [place] [address]     Pathology No orders of the defined types were placed in this encounter.      Zoila Shutter MD

## 2018-03-26 NOTE — Progress Notes (Signed)
Pt here today for Q6 month Prolia injection. Pt given injection in right arm. Pt tolerated injection well with no complaints. Pt stable and discharged home ambulatory. Pt to return in 6 months for next Prolia injection.

## 2018-03-28 LAB — PROTEIN ELECTROPHORESIS, SERUM
A/G Ratio: 1 (ref 0.7–1.7)
Albumin ELP: 3.5 g/dL (ref 2.9–4.4)
Alpha-1-Globulin: 0.2 g/dL (ref 0.0–0.4)
Alpha-2-Globulin: 0.8 g/dL (ref 0.4–1.0)
Beta Globulin: 1.1 g/dL (ref 0.7–1.3)
Gamma Globulin: 1.5 g/dL (ref 0.4–1.8)
Globulin, Total: 3.5 g/dL (ref 2.2–3.9)
Total Protein ELP: 7 g/dL (ref 6.0–8.5)

## 2018-04-02 ENCOUNTER — Ambulatory Visit (HOSPITAL_COMMUNITY): Payer: BLUE CROSS/BLUE SHIELD | Admitting: Physical Therapy

## 2018-04-06 ENCOUNTER — Encounter: Payer: BLUE CROSS/BLUE SHIELD | Attending: Physical Medicine & Rehabilitation | Admitting: Registered Nurse

## 2018-04-06 ENCOUNTER — Encounter: Payer: Self-pay | Admitting: Registered Nurse

## 2018-04-06 VITALS — BP 101/67 | HR 94 | Ht 64.0 in | Wt 143.0 lb

## 2018-04-06 DIAGNOSIS — M5416 Radiculopathy, lumbar region: Secondary | ICD-10-CM | POA: Diagnosis not present

## 2018-04-06 DIAGNOSIS — G894 Chronic pain syndrome: Secondary | ICD-10-CM | POA: Diagnosis not present

## 2018-04-06 DIAGNOSIS — M542 Cervicalgia: Secondary | ICD-10-CM | POA: Diagnosis not present

## 2018-04-06 DIAGNOSIS — Z9221 Personal history of antineoplastic chemotherapy: Secondary | ICD-10-CM | POA: Insufficient documentation

## 2018-04-06 DIAGNOSIS — M47816 Spondylosis without myelopathy or radiculopathy, lumbar region: Secondary | ICD-10-CM | POA: Diagnosis not present

## 2018-04-06 DIAGNOSIS — Z853 Personal history of malignant neoplasm of breast: Secondary | ICD-10-CM | POA: Diagnosis not present

## 2018-04-06 DIAGNOSIS — G8929 Other chronic pain: Secondary | ICD-10-CM | POA: Insufficient documentation

## 2018-04-06 DIAGNOSIS — Z79899 Other long term (current) drug therapy: Secondary | ICD-10-CM | POA: Diagnosis not present

## 2018-04-06 DIAGNOSIS — T451X5A Adverse effect of antineoplastic and immunosuppressive drugs, initial encounter: Secondary | ICD-10-CM | POA: Diagnosis not present

## 2018-04-06 DIAGNOSIS — I890 Lymphedema, not elsewhere classified: Secondary | ICD-10-CM | POA: Insufficient documentation

## 2018-04-06 DIAGNOSIS — M7061 Trochanteric bursitis, right hip: Secondary | ICD-10-CM

## 2018-04-06 DIAGNOSIS — Z5181 Encounter for therapeutic drug level monitoring: Secondary | ICD-10-CM

## 2018-04-06 DIAGNOSIS — M62838 Other muscle spasm: Secondary | ICD-10-CM | POA: Diagnosis not present

## 2018-04-06 DIAGNOSIS — M5412 Radiculopathy, cervical region: Secondary | ICD-10-CM

## 2018-04-06 DIAGNOSIS — G620 Drug-induced polyneuropathy: Secondary | ICD-10-CM

## 2018-04-06 DIAGNOSIS — M545 Low back pain: Secondary | ICD-10-CM | POA: Diagnosis not present

## 2018-04-06 MED ORDER — PREGABALIN 100 MG PO CAPS: 100.0000 mg | ORAL_CAPSULE | Freq: Two times a day (BID) | ORAL | 3 refills | Status: DC

## 2018-04-06 MED ORDER — OXYCODONE-ACETAMINOPHEN 10-325 MG PO TABS: 1.0000 | ORAL_TABLET | Freq: Three times a day (TID) | ORAL | 0 refills | Status: DC | PRN

## 2018-04-06 MED ORDER — FENTANYL 12 MCG/HR TD PT72: 1.0000 | MEDICATED_PATCH | TRANSDERMAL | 0 refills | Status: DC

## 2018-04-06 NOTE — Progress Notes (Signed)
Subjective:    Patient ID: Krista Doyle, female    DOB: [birth year], 58 y.o.   MRN: [id number]  HPI: Krista Doyle is a 58 y.o. female who returns for follow up appointment for chronic pain and medication refill. She states her pain is located in her neck radiating into her bilateral shoulders R>L, mid- lower back pain radiating into her right lower extremity and right hip pain. She rates her pain 6. Her current exercise regime is walking, ball therapy three days a week  and performing stretching exercises.  Ms. Kneeland Morphine equivalent is  73.80MME.  Last oral swab was performed on 03/07/2018, it was consistent.    Pain Inventory Average Pain 8 Pain Right Now 6 My pain is constant, stabbing, tingling and aching  In the last 24 hours, has pain interfered with the following? General activity 7 Relation with others 7 Enjoyment of life 9 What TIME of day is your pain at its worst? evening, night Sleep (in general) Fair  Pain is worse with: walking, bending, sitting and standing Pain improves with: medication and TENS Relief from Meds: 8  Mobility walk with assistance use a cane use a walker ability to climb steps?  yes do you drive?  yes  Function disabled: date disabled .  Neuro/Psych numbness tingling spasms  Prior Studies Any changes since last visit?  no  Physicians involved in your care Any changes since last visit?  no   Family History  Problem Relation Age of Onset  . Colon cancer Neg Hx    Social History   Socioeconomic History  . Marital status: Married    Spouse name: Not on file  . Number of children: Not on file  . Years of education: Not on file  . Highest education level: Not on file  Occupational History  . Occupation: Disabled  Social Needs  . Financial resource strain: Not on file  . Food insecurity:    Worry: Not on file    Inability: Not on file  . Transportation needs:    Medical: Not on file    Non-medical: Not  on file  Tobacco Use  . Smoking status: Never Smoker  . Smokeless tobacco: Never Used  Substance and Sexual Activity  . Alcohol use: No    Alcohol/week: 0.0 standard drinks  . Drug use: No  . Sexual activity: Yes  Lifestyle  . Physical activity:    Days per week: Not on file    Minutes per session: Not on file  . Stress: Not on file  Relationships  . Social connections:    Talks on phone: Not on file    Gets together: Not on file    Attends religious service: Not on file    Active member of club or organization: Not on file    Attends meetings of clubs or organizations: Not on file    Relationship status: Not on file  Other Topics Concern  . Not on file  Social History Narrative  . Not on file   Past Surgical History:  Procedure Laterality Date  . BACK SURGERY    . BREAST LUMPECTOMY    . COLONOSCOPY     about 3 yrs ago in [place]  . KNEE SURGERY     right  . TONSILLECTOMY     Past Medical History:  Diagnosis Date  . Asthma   . Back pain   . Breast cancer ([place]) 2008   left  . Diabetes ([place])   .  Hypercholesterolemia   . Hypertension   . 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 Personal history of chemotherapy   . Personal history of radiation therapy 2008   BP 101/67   Pulse 94   Ht 5\' 4"  (1.626 m)   Wt 143 lb (64.9 kg)   SpO2 99%   BMI 24.55 kg/m   Opioid Risk Score:   Fall Risk Score:  `1  Depression screen PHQ 2/9  Depression screen [facility] 2/9 12/11/2017 11/07/2017 04/21/2017 01/13/2017 05/27/2016 03/28/2016 10/07/2015  Decreased Interest 0 0 - 0 0 0 0  Down, Depressed, Hopeless 0 0 - 0 0 0 0  PHQ - 2 Score 0 0 - 0 0 0 0  Altered sleeping - - 0 - - - -  Tired, decreased energy - - 0 - - - -  Change in appetite - - - - - - -  Feeling bad or failure about yourself  - - - - - - -  Trouble concentrating - - - - - - -  Moving slowly or fidgety/restless - - - - - - -  Suicidal thoughts - - - - - - -  PHQ-9 Score - - - - - - -  Difficult doing  work/chores - - - - - - -    Review of Systems  Constitutional: Negative.   HENT: Negative.   Eyes: Negative.   Respiratory: Negative.   Cardiovascular: Negative.   Gastrointestinal: Negative.   Endocrine: Negative.   Genitourinary: Negative.   Musculoskeletal: Positive for back pain.       Spasms   Skin: Negative.   Allergic/Immunologic: Negative.   Neurological: Positive for numbness.       Tingling  Hematological: Negative.   Psychiatric/Behavioral: Negative.   All other systems reviewed and are negative.      Objective:   Physical Exam Vitals signs and nursing note reviewed.  Constitutional:      Appearance: Normal appearance.  Neck:     Musculoskeletal: Normal range of motion and neck supple.  Cardiovascular:     Rate and Rhythm: Normal rate and regular rhythm.  Pulmonary:     Effort: Pulmonary effort is normal.     Breath sounds: Normal breath sounds.  Musculoskeletal:     Comments: Normal Muscle Bulk and Muscle Testing Reveals:  Upper Extremities:Right Full  ROM and Muscle Strength 4/5 Left: Decreased ROM 45 Degrees and Muscle Strength 4/5   Thoracic Paraspinal Tenderness: T-7-T-9  Lumbar Hypersensitivity Right Greater Trochanter Tenderness Lower Extremities : Decreased ROM and Muscle Strength 4/5  Right Lower Extremity Flexion Produces Pain into Extremity Arises from Table slowly  Antalgic Gait   Skin:    General: Skin is warm and dry.  Neurological:     Mental Status: She is alert and oriented to person, place, and time.  Psychiatric:        Behavior: Behavior normal.           Assessment & Plan:  1. Chronic low back pain, Lumbar Facet Arthropathy,lumbar spondylosis, spondylolisthesis, with lumbar radiculopathy.Continue current medication regimen:04/06/2018 Refilled: Fentanyl 12 mcg one patch every three days #10 and Oxycodone 10/325 mg one tablet every 8 hours as needed for pain #90.  We will continue the opioid monitoring program, this  consists of regular clinic visits, examinations, urine drug screen, pill counts as well as use of [place] Controlled Substance reporting System. 2. Chemotherapy induced peripheral neuropathy:Decreased  Lyrica, due to daytime somnolence at100mg BID.04/06/2018 3. Chronic Midline  Thoracic Pain: Continue HEP and Current Medication Regime.04/06/2018 4. Muscle Spasm: Continuemedication regimen withRobaxin03/07/2018 5. Right Knee Pain:No complaints today.Continue Current Medication Regime: Continue HEP as Tolerated. Continue to Monitor.04/06/2018 6. Right Greater Trochanteric Bursitis: Continue to Alternate Ice and Heat Therapy.04/06/2018 7. Chronic Pain Syndrome: Continue Amitriptyline, Fentanyly and  Oxycodone. Continue to Monitor.   20 minutes of face to face patient care time was spent during this visit. All questions were encouraged and answered.  F/U in 1 month

## 2018-04-11 ENCOUNTER — Ambulatory Visit (HOSPITAL_COMMUNITY): Payer: BLUE CROSS/BLUE SHIELD | Admitting: Physical Therapy

## 2018-04-17 ENCOUNTER — Other Ambulatory Visit: Payer: Self-pay | Admitting: Physical Medicine & Rehabilitation

## 2018-04-17 DIAGNOSIS — G620 Drug-induced polyneuropathy: Secondary | ICD-10-CM

## 2018-04-17 DIAGNOSIS — T451X5A Adverse effect of antineoplastic and immunosuppressive drugs, initial encounter: Secondary | ICD-10-CM

## 2018-04-17 DIAGNOSIS — G894 Chronic pain syndrome: Secondary | ICD-10-CM

## 2018-04-20 ENCOUNTER — Telehealth (HOSPITAL_COMMUNITY): Payer: Self-pay

## 2018-04-20 NOTE — Telephone Encounter (Signed)
I called the home number on file and spoke with [name]. I informed her that our office is cancelling all appointments for 2 weeks for the well being of our staff and patients during the [place] spread. I informed her we are planning to re-open on 05/07/18 and that I will keep her appointments scheduled beyond that. I informed her her new evaluation date is 05/09/18 at 11:15 AM and that if anything changes we will call her again.   Kipp Brood, PT, DPT, [facility] Physical Therapist with [facility]  04/20/2018 10:25 AM

## 2018-05-01 ENCOUNTER — Telehealth (HOSPITAL_COMMUNITY): Payer: Self-pay

## 2018-05-01 NOTE — Telephone Encounter (Signed)
I called Ms. Carter-Ohlendorf on her home number and informed her our office is going to close for an additional 2 weeks at this time. I offered to cancel all of her upcoming visits and call her when we officially re-open to avoid any confusion with appointments. She agreed that would be best. She denied any wounds on her LE's and reports that she is using her compression pump every day for an hour.   We will call to reschedule her evaluation and treatments at a later date/time when re-opened.   Kipp Brood, PT, DPT, [facility] Physical Therapist with [facility]  05/01/2018 1:15 PM

## 2018-05-02 ENCOUNTER — Ambulatory Visit (HOSPITAL_COMMUNITY): Payer: BLUE CROSS/BLUE SHIELD | Admitting: Physical Therapy

## 2018-05-02 ENCOUNTER — Ambulatory Visit: Payer: BLUE CROSS/BLUE SHIELD | Admitting: Physical Medicine & Rehabilitation

## 2018-05-02 ENCOUNTER — Telehealth (HOSPITAL_COMMUNITY): Payer: Self-pay | Admitting: Physical Therapy

## 2018-05-02 NOTE — Telephone Encounter (Signed)
SPoke with patient she understand we will call her back to r/s eval for Lymphedema at a later date. NF 05/02/2018

## 2018-05-07 ENCOUNTER — Other Ambulatory Visit: Payer: Self-pay | Admitting: Physical Medicine & Rehabilitation

## 2018-05-07 ENCOUNTER — Other Ambulatory Visit: Payer: Self-pay

## 2018-05-07 ENCOUNTER — Encounter: Payer: BLUE CROSS/BLUE SHIELD | Attending: Physical Medicine & Rehabilitation | Admitting: Registered Nurse

## 2018-05-07 ENCOUNTER — Encounter: Payer: Self-pay | Admitting: Registered Nurse

## 2018-05-07 ENCOUNTER — Encounter (HOSPITAL_COMMUNITY): Payer: BLUE CROSS/BLUE SHIELD | Admitting: Physical Therapy

## 2018-05-07 VITALS — BP 131/85 | HR 87 | Ht 64.0 in | Wt 141.0 lb

## 2018-05-07 DIAGNOSIS — T451X5A Adverse effect of antineoplastic and immunosuppressive drugs, initial encounter: Secondary | ICD-10-CM

## 2018-05-07 DIAGNOSIS — G620 Drug-induced polyneuropathy: Secondary | ICD-10-CM

## 2018-05-07 DIAGNOSIS — M7061 Trochanteric bursitis, right hip: Secondary | ICD-10-CM | POA: Diagnosis not present

## 2018-05-07 DIAGNOSIS — M545 Low back pain: Secondary | ICD-10-CM | POA: Insufficient documentation

## 2018-05-07 DIAGNOSIS — G894 Chronic pain syndrome: Secondary | ICD-10-CM | POA: Diagnosis not present

## 2018-05-07 DIAGNOSIS — Z5181 Encounter for therapeutic drug level monitoring: Secondary | ICD-10-CM | POA: Diagnosis not present

## 2018-05-07 DIAGNOSIS — M62838 Other muscle spasm: Secondary | ICD-10-CM

## 2018-05-07 DIAGNOSIS — G8929 Other chronic pain: Secondary | ICD-10-CM | POA: Diagnosis not present

## 2018-05-07 DIAGNOSIS — M5416 Radiculopathy, lumbar region: Secondary | ICD-10-CM | POA: Diagnosis not present

## 2018-05-07 DIAGNOSIS — Z79899 Other long term (current) drug therapy: Secondary | ICD-10-CM

## 2018-05-07 DIAGNOSIS — Z853 Personal history of malignant neoplasm of breast: Secondary | ICD-10-CM | POA: Insufficient documentation

## 2018-05-07 DIAGNOSIS — M47816 Spondylosis without myelopathy or radiculopathy, lumbar region: Secondary | ICD-10-CM

## 2018-05-07 DIAGNOSIS — Z9221 Personal history of antineoplastic chemotherapy: Secondary | ICD-10-CM | POA: Insufficient documentation

## 2018-05-07 DIAGNOSIS — M546 Pain in thoracic spine: Secondary | ICD-10-CM | POA: Diagnosis not present

## 2018-05-07 DIAGNOSIS — I890 Lymphedema, not elsewhere classified: Secondary | ICD-10-CM | POA: Insufficient documentation

## 2018-05-07 MED ORDER — FENTANYL 12 MCG/HR TD PT72: 1.0000 | MEDICATED_PATCH | TRANSDERMAL | 0 refills | Status: DC

## 2018-05-07 MED ORDER — OXYCODONE-ACETAMINOPHEN 10-325 MG PO TABS: 1.0000 | ORAL_TABLET | Freq: Three times a day (TID) | ORAL | 0 refills | Status: DC | PRN

## 2018-05-07 NOTE — Progress Notes (Signed)
Subjective:    Patient ID: Krista Doyle, female    DOB: 07/01/1960, 58 y.o.   MRN: [id number]  HPI: Krista Doyle is a 58 y.o. female her appointment was changed, due to national recommendations of social distancing due to [place] 19, an audio/video telehealth visit is felt to be most appropriate for this patient at this time. See Chart message from today for the patient's consent to telehealth from [place].      She states her pain is located in her mid- lower back radiating into her right hip and right lower extremity and right knee pain. She rates her pain 6. Her current exercise regime is walking and performing stretching exercises.  Ms. Ishii Morphine equivalent is 73.80 MME. Last Oral swab was performed on 03/07/2018, it was consistent.   Wenda Overland RN asked the Health History Questions, this provider and Wenda Overland verified we were speaking with the correct person using two identifiers.  Pain Inventory Average Pain 7 Pain Right Now 6 My pain is constant, sharp and stabbing  In the last 24 hours, has pain interfered with the following? General activity 7 Relation with others 7 Enjoyment of life 7 What TIME of day is your pain at its worst? evening and night Sleep (in general) Fair  Pain is worse with: walking, bending, sitting, standing and some activites Pain improves with: medication and TENS Relief from Meds: 7  Mobility walk without assistance ability to climb steps?  yes do you drive?  yes  Function disabled: date disabled . I need assistance with the following:  household duties and shopping  Neuro/Psych numbness tingling spasms  Prior Studies Any changes since last visit?  no  Physicians involved in your care Any changes since last visit?  no   Family History  Problem Relation Age of Onset  . Colon cancer Neg Hx    Social History   Socioeconomic History  . Marital status:  Married    Spouse name: Not on file  . Number of children: Not on file  . Years of education: Not on file  . Highest education level: Not on file  Occupational History  . Occupation: Disabled  Social Needs  . Financial resource strain: Not on file  . Food insecurity:    Worry: Not on file    Inability: Not on file  . Transportation needs:    Medical: Not on file    Non-medical: Not on file  Tobacco Use  . Smoking status: Never Smoker  . Smokeless tobacco: Never Used  Substance and Sexual Activity  . Alcohol use: No    Alcohol/week: 0.0 standard drinks  . Drug use: No  . Sexual activity: Yes  Lifestyle  . Physical activity:    Days per week: Not on file    Minutes per session: Not on file  . Stress: Not on file  Relationships  . Social connections:    Talks on phone: Not on file    Gets together: Not on file    Attends religious service: Not on file    Active member of club or organization: Not on file    Attends meetings of clubs or organizations: Not on file    Relationship status: Not on file  Other Topics Concern  . Not on file  Social History Narrative  . Not on file   Past Surgical History:  Procedure Laterality Date  . BACK SURGERY    . BREAST LUMPECTOMY    .  COLONOSCOPY     about 3 yrs ago in [place]  . KNEE SURGERY     right  . TONSILLECTOMY     Past Medical History:  Diagnosis Date  . Asthma   . Back pain   . Breast cancer ([place]) 2008   left  . Diabetes ([place])   . Hypercholesterolemia   . Hypertension   . 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 Personal history of chemotherapy   . Personal history of radiation therapy 2008   BP 131/85 Comment: self check- reported  Pulse 87 Comment: self check- reported  Ht 5\' 4"  (1.626 m) Comment: self check- reported  Wt 141 lb (64 kg) Comment: self check- reported  BMI 24.20 kg/m   Opioid Risk Score:   Fall Risk Score:  `1  Depression screen PHQ 2/9  Depression screen [facility] 2/9 12/11/2017  11/07/2017 04/21/2017 01/13/2017 05/27/2016 03/28/2016 10/07/2015  Decreased Interest 0 0 - 0 0 0 0  Down, Depressed, Hopeless 0 0 - 0 0 0 0  PHQ - 2 Score 0 0 - 0 0 0 0  Altered sleeping - - 0 - - - -  Tired, decreased energy - - 0 - - - -  Change in appetite - - - - - - -  Feeling bad or failure about yourself  - - - - - - -  Trouble concentrating - - - - - - -  Moving slowly or fidgety/restless - - - - - - -  Suicidal thoughts - - - - - - -  PHQ-9 Score - - - - - - -  Difficult doing work/chores - - - - - - -    Review of Systems  Constitutional: Negative.   HENT: Negative.   Eyes: Negative.   Respiratory: Positive for wheezing.        Asthma  Cardiovascular: Negative.   Gastrointestinal: Negative.   Endocrine: Negative.   Genitourinary: Negative.   Musculoskeletal:       Spasms  Skin: Negative.   Allergic/Immunologic: Negative.   Neurological: Positive for numbness.       Tingling  Hematological: Negative.   Psychiatric/Behavioral: Negative.   All other systems reviewed and are negative.      Objective:   Physical Exam Vitals signs and nursing note reviewed.  Neurological:     Mental Status: She is oriented to person, place, and time.           Assessment & Plan:  1. Chronic low back pain, Lumbar Facet Arthropathy,lumbar spondylosis, spondylolisthesis, with lumbar radiculopathy.Continuecurrentmedication regimen:05/07/2018 Refilled: Fentanyl 12 mcg one patch every three days #10 and Oxycodone 10/325 mg one tablet every 8 hours as needed for pain #90.  We will continue the opioid monitoring program, this consists of regular clinic visits, examinations, urine drug screen, pill counts as well as use of [place] Controlled Substance reporting System. 2. Chemotherapy induced peripheral neuropathy:Continue Lyrica. 05/07/2018 3. Chronic Midline Thoracic Pain: Continue HEP and Current Medication Regime.05/07/2018 4. Muscle Spasm: Continuemedication regimen  withRobaxin04/07/2018 5. Right Knee Pain:Continue Current Medication Regime: Continue HEP as Tolerated and  Continue to Monitor.05/07/2018 6. Right Greater Trochanteric Bursitis: Continue to Alternate Ice and Heat Therapy.05/07/2018 7. Chronic Pain Syndrome: ContinueAmitriptyline, Fentanyly and Oxycodone. Continue to Monitor.05/07/2018  Web-EX Location of patient: In Her Home Location of provider: Office Time spent on call: 15 minutes

## 2018-05-09 ENCOUNTER — Ambulatory Visit (HOSPITAL_COMMUNITY): Payer: Self-pay | Admitting: Physical Therapy

## 2018-05-10 ENCOUNTER — Encounter (HOSPITAL_COMMUNITY): Payer: BLUE CROSS/BLUE SHIELD | Admitting: Physical Therapy

## 2018-05-10 DIAGNOSIS — J4520 Mild intermittent asthma, uncomplicated: Secondary | ICD-10-CM | POA: Diagnosis not present

## 2018-05-10 DIAGNOSIS — G629 Polyneuropathy, unspecified: Secondary | ICD-10-CM | POA: Diagnosis not present

## 2018-05-10 DIAGNOSIS — I10 Essential (primary) hypertension: Secondary | ICD-10-CM | POA: Diagnosis not present

## 2018-05-14 ENCOUNTER — Encounter (HOSPITAL_COMMUNITY): Payer: BLUE CROSS/BLUE SHIELD | Admitting: Physical Therapy

## 2018-05-16 ENCOUNTER — Encounter (HOSPITAL_COMMUNITY): Payer: BLUE CROSS/BLUE SHIELD | Admitting: Physical Therapy

## 2018-05-17 ENCOUNTER — Encounter (HOSPITAL_COMMUNITY): Payer: BLUE CROSS/BLUE SHIELD | Admitting: Physical Therapy

## 2018-05-21 ENCOUNTER — Encounter (HOSPITAL_COMMUNITY): Payer: BLUE CROSS/BLUE SHIELD | Admitting: Physical Therapy

## 2018-05-23 ENCOUNTER — Encounter (HOSPITAL_COMMUNITY): Payer: BLUE CROSS/BLUE SHIELD | Admitting: Physical Therapy

## 2018-05-24 ENCOUNTER — Encounter (HOSPITAL_COMMUNITY): Payer: BLUE CROSS/BLUE SHIELD | Admitting: Physical Therapy

## 2018-05-28 ENCOUNTER — Encounter (HOSPITAL_COMMUNITY): Payer: BLUE CROSS/BLUE SHIELD | Admitting: Physical Therapy

## 2018-05-30 ENCOUNTER — Encounter (HOSPITAL_COMMUNITY): Payer: BLUE CROSS/BLUE SHIELD | Admitting: Physical Therapy

## 2018-06-01 ENCOUNTER — Encounter (HOSPITAL_COMMUNITY): Payer: BLUE CROSS/BLUE SHIELD | Admitting: Physical Therapy

## 2018-06-04 ENCOUNTER — Encounter (HOSPITAL_COMMUNITY): Payer: BLUE CROSS/BLUE SHIELD | Admitting: Physical Therapy

## 2018-06-06 ENCOUNTER — Encounter (HOSPITAL_COMMUNITY): Payer: BLUE CROSS/BLUE SHIELD | Admitting: Physical Therapy

## 2018-06-08 ENCOUNTER — Encounter (HOSPITAL_COMMUNITY): Payer: BLUE CROSS/BLUE SHIELD | Admitting: Physical Therapy

## 2018-06-11 ENCOUNTER — Ambulatory Visit: Payer: BLUE CROSS/BLUE SHIELD | Admitting: Registered Nurse

## 2018-06-11 DIAGNOSIS — E1140 Type 2 diabetes mellitus with diabetic neuropathy, unspecified: Secondary | ICD-10-CM | POA: Diagnosis not present

## 2018-06-11 DIAGNOSIS — B351 Tinea unguium: Secondary | ICD-10-CM | POA: Diagnosis not present

## 2018-06-12 ENCOUNTER — Encounter: Payer: BLUE CROSS/BLUE SHIELD | Attending: Physical Medicine & Rehabilitation | Admitting: Registered Nurse

## 2018-06-12 ENCOUNTER — Encounter: Payer: Self-pay | Admitting: Registered Nurse

## 2018-06-12 ENCOUNTER — Other Ambulatory Visit: Payer: Self-pay

## 2018-06-12 VITALS — BP 115/78 | HR 90 | Temp 97.0°F | Ht 63.0 in | Wt 137.0 lb

## 2018-06-12 DIAGNOSIS — G8929 Other chronic pain: Secondary | ICD-10-CM

## 2018-06-12 DIAGNOSIS — Z79899 Other long term (current) drug therapy: Secondary | ICD-10-CM

## 2018-06-12 DIAGNOSIS — M47816 Spondylosis without myelopathy or radiculopathy, lumbar region: Secondary | ICD-10-CM | POA: Diagnosis not present

## 2018-06-12 DIAGNOSIS — G620 Drug-induced polyneuropathy: Secondary | ICD-10-CM | POA: Insufficient documentation

## 2018-06-12 DIAGNOSIS — M62838 Other muscle spasm: Secondary | ICD-10-CM | POA: Diagnosis not present

## 2018-06-12 DIAGNOSIS — T451X5A Adverse effect of antineoplastic and immunosuppressive drugs, initial encounter: Secondary | ICD-10-CM | POA: Diagnosis not present

## 2018-06-12 DIAGNOSIS — Z9221 Personal history of antineoplastic chemotherapy: Secondary | ICD-10-CM | POA: Insufficient documentation

## 2018-06-12 DIAGNOSIS — M545 Low back pain: Secondary | ICD-10-CM | POA: Insufficient documentation

## 2018-06-12 DIAGNOSIS — Z5181 Encounter for therapeutic drug level monitoring: Secondary | ICD-10-CM

## 2018-06-12 DIAGNOSIS — Z853 Personal history of malignant neoplasm of breast: Secondary | ICD-10-CM | POA: Insufficient documentation

## 2018-06-12 DIAGNOSIS — M5416 Radiculopathy, lumbar region: Secondary | ICD-10-CM | POA: Diagnosis not present

## 2018-06-12 DIAGNOSIS — I890 Lymphedema, not elsewhere classified: Secondary | ICD-10-CM | POA: Insufficient documentation

## 2018-06-12 DIAGNOSIS — G894 Chronic pain syndrome: Secondary | ICD-10-CM | POA: Diagnosis not present

## 2018-06-12 DIAGNOSIS — M546 Pain in thoracic spine: Secondary | ICD-10-CM | POA: Diagnosis not present

## 2018-06-12 MED ORDER — OXYCODONE-ACETAMINOPHEN 10-325 MG PO TABS: 1.0000 | ORAL_TABLET | Freq: Three times a day (TID) | ORAL | 0 refills | Status: DC | PRN

## 2018-06-12 MED ORDER — FENTANYL 12 MCG/HR TD PT72: 1.0000 | MEDICATED_PATCH | TRANSDERMAL | 0 refills | Status: DC

## 2018-06-12 NOTE — Progress Notes (Addendum)
Subjective:    Patient ID: Krista Doyle, female    DOB: 05/28/1960, 58 y.o.   MRN: [id number]  HPI: Haydon Kalmar is a 58 y.o. female her appointment was changed, due to national recommendations of social distancing due to [place] 19, an audio/video telehealth visit is felt to be most appropriate for this patient at this time.  See Chart message from today for the patient's consent to telehealth from [place].     She  States her pain is located in her mid- lower back radiating into her right lower extremity. She rates her pain 6. Her current exercise regime is walking and  Ball therapy three times a week.   Ms. Meredith Kilbride Morphine equivalent is 73.80 MME.  Last Oral Swab was Performed on 03/07/2018, it was consistent.  Marland Mcalpine CMA asked the Health and History Questions. This provider and Marland Mcalpine verified we were  speaking with the correct person using two identifiers.   Pain Inventory Average Pain 8 Pain Right Now 6 My pain is sharp and aching  In the last 24 hours, has pain interfered with the following? General activity 6 Relation with others 6 Enjoyment of life 6 What TIME of day is your pain at its worst? evening Sleep (in general) Fair  Pain is worse with: walking, bending, sitting, standing and some activites Pain improves with: rest, heat/ice, therapy/exercise, medication and TENS Relief from Meds: 6  Mobility walk with assistance use a cane use a walker ability to climb steps?  yes do you drive?  yes  Function disabled: date disabled 2008  Neuro/Psych numbness tingling trouble walking spasms  Prior Studies Any changes since last visit?  no  Physicians involved in your care Any changes since last visit?  no   Family History  Problem Relation Age of Onset  . Colon cancer Neg Hx    Social History   Socioeconomic History  . Marital status: Married    Spouse name: Not on file  . Number  of children: Not on file  . Years of education: Not on file  . Highest education level: Not on file  Occupational History  . Occupation: Disabled  Social Needs  . Financial resource strain: Not on file  . Food insecurity:    Worry: Not on file    Inability: Not on file  . Transportation needs:    Medical: Not on file    Non-medical: Not on file  Tobacco Use  . Smoking status: Never Smoker  . Smokeless tobacco: Never Used  Substance and Sexual Activity  . Alcohol use: No    Alcohol/week: 0.0 standard drinks  . Drug use: No  . Sexual activity: Yes  Lifestyle  . Physical activity:    Days per week: Not on file    Minutes per session: Not on file  . Stress: Not on file  Relationships  . Social connections:    Talks on phone: Not on file    Gets together: Not on file    Attends religious service: Not on file    Active member of club or organization: Not on file    Attends meetings of clubs or organizations: Not on file    Relationship status: Not on file  Other Topics Concern  . Not on file  Social History Narrative  . Not on file   Past Surgical History:  Procedure Laterality Date  . BACK SURGERY    . BREAST LUMPECTOMY    .  COLONOSCOPY     about 3 yrs ago in [place]  . KNEE SURGERY     right  . TONSILLECTOMY     Past Medical History:  Diagnosis Date  . Asthma   . Back pain   . Breast cancer ([place]) 2008   left  . Diabetes ([place])   . Hypercholesterolemia   . Hypertension   . 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 Personal history of chemotherapy   . Personal history of radiation therapy 2008   There were no vitals taken for this visit.  Opioid Risk Score:   Fall Risk Score:  `1  Depression screen PHQ 2/9  Depression screen [facility] 2/9 12/11/2017 11/07/2017 04/21/2017 01/13/2017 05/27/2016 03/28/2016 10/07/2015  Decreased Interest 0 0 - 0 0 0 0  Down, Depressed, Hopeless 0 0 - 0 0 0 0  PHQ - 2 Score 0 0 - 0 0 0 0  Altered sleeping - - 0 - - - -  Tired,  decreased energy - - 0 - - - -  Change in appetite - - - - - - -  Feeling bad or failure about yourself  - - - - - - -  Trouble concentrating - - - - - - -  Moving slowly or fidgety/restless - - - - - - -  Suicidal thoughts - - - - - - -  PHQ-9 Score - - - - - - -  Difficult doing work/chores - - - - - - -     Review of Systems  Constitutional: Negative.   HENT: Negative.   Eyes: Negative.   Respiratory: Negative.   Cardiovascular: Negative.   Gastrointestinal: Negative.   Endocrine: Negative.   Genitourinary: Negative.   Musculoskeletal: Positive for arthralgias, back pain, gait problem and myalgias.  Skin: Negative.   Allergic/Immunologic: Negative.   Neurological: Positive for numbness.  Hematological: Negative.   Psychiatric/Behavioral: Negative.   All other systems reviewed and are negative.      Objective:   Physical Exam Vitals signs and nursing note reviewed.  Musculoskeletal:     Comments: No Physical Exam Performed" Virtual Exam   Neurological:     Mental Status: She is oriented to person, place, and time.           Assessment & Plan:  1. Chronic low back pain, Lumbar Facet Arthropathy,lumbar spondylosis, spondylolisthesis, with lumbar radiculopathy.Continuecurrentmedication regimen:06/12/2018 Refilled: Fentanyl 12 mcg one patch every three days #10 and Oxycodone 10/325 mg one tablet every 8 hours as needed for pain #90.  We will continue the opioid monitoring program, this consists of regular clinic visits, examinations, urine drug screen, pill counts as well as use of [place] Controlled Substance reporting System. 2. Chemotherapy induced peripheral neuropathy:Continue Lyrica. 06/12/2018 3. Chronic Midline Thoracic Pain: Continue HEP and Current Medication Regime.06/12/2018 4. Muscle Spasm: Continuemedication regimen withRobaxin05/01/2019 5. Right Knee Pain:No complaints today. Continue Current Medication Regime: Continue HEP as Tolerated  and  Continue to Monitor.06/12/2018 6. Right Greater Trochanteric Bursitis: No complaints today.Continue to Alternate Ice and Heat Therapy.06/12/2018 7. Chronic Pain Syndrome: ContinueAmitriptyline, Fentanyly and Oxycodone. Continue to Monitor.06/12/2018  F/U in 1 month  Web-EX Location of patient: In Her Home Location of provider: Office Time spent on call: 15 minutes

## 2018-06-13 ENCOUNTER — Telehealth (HOSPITAL_COMMUNITY): Payer: Self-pay | Admitting: Physical Therapy

## 2018-06-13 NOTE — Telephone Encounter (Signed)
Called patient regarding recent referral to begin lymphedema therapy.  Identity verified by DOB.  Discussed current functional status and limitations.  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  Pt requests we contact her mid July to schedule therapy then if needed.  Teena Irani, PTA/CLT [phone]

## 2018-06-15 ENCOUNTER — Other Ambulatory Visit: Payer: Self-pay | Admitting: Physical Medicine & Rehabilitation

## 2018-06-15 DIAGNOSIS — M62838 Other muscle spasm: Secondary | ICD-10-CM

## 2018-07-11 ENCOUNTER — Other Ambulatory Visit: Payer: Self-pay

## 2018-07-11 ENCOUNTER — Encounter: Payer: BC Managed Care – PPO | Attending: Physical Medicine & Rehabilitation | Admitting: Registered Nurse

## 2018-07-11 VITALS — BP 131/81 | HR 100 | Temp 96.0°F | Ht 64.0 in | Wt 144.8 lb

## 2018-07-11 DIAGNOSIS — Z853 Personal history of malignant neoplasm of breast: Secondary | ICD-10-CM | POA: Diagnosis not present

## 2018-07-11 DIAGNOSIS — Z79899 Other long term (current) drug therapy: Secondary | ICD-10-CM | POA: Diagnosis not present

## 2018-07-11 DIAGNOSIS — M545 Low back pain: Secondary | ICD-10-CM | POA: Diagnosis not present

## 2018-07-11 DIAGNOSIS — I890 Lymphedema, not elsewhere classified: Secondary | ICD-10-CM | POA: Insufficient documentation

## 2018-07-11 DIAGNOSIS — Z9221 Personal history of antineoplastic chemotherapy: Secondary | ICD-10-CM | POA: Diagnosis not present

## 2018-07-11 DIAGNOSIS — Z5181 Encounter for therapeutic drug level monitoring: Secondary | ICD-10-CM | POA: Diagnosis not present

## 2018-07-11 DIAGNOSIS — T451X5A Adverse effect of antineoplastic and immunosuppressive drugs, initial encounter: Secondary | ICD-10-CM

## 2018-07-11 DIAGNOSIS — G620 Drug-induced polyneuropathy: Secondary | ICD-10-CM | POA: Diagnosis not present

## 2018-07-11 DIAGNOSIS — M47816 Spondylosis without myelopathy or radiculopathy, lumbar region: Secondary | ICD-10-CM | POA: Insufficient documentation

## 2018-07-11 DIAGNOSIS — M5416 Radiculopathy, lumbar region: Secondary | ICD-10-CM | POA: Diagnosis not present

## 2018-07-11 DIAGNOSIS — M546 Pain in thoracic spine: Secondary | ICD-10-CM | POA: Diagnosis not present

## 2018-07-11 DIAGNOSIS — M7061 Trochanteric bursitis, right hip: Secondary | ICD-10-CM | POA: Diagnosis not present

## 2018-07-11 DIAGNOSIS — G894 Chronic pain syndrome: Secondary | ICD-10-CM | POA: Diagnosis not present

## 2018-07-11 DIAGNOSIS — G8929 Other chronic pain: Secondary | ICD-10-CM | POA: Insufficient documentation

## 2018-07-11 DIAGNOSIS — M62838 Other muscle spasm: Secondary | ICD-10-CM | POA: Diagnosis not present

## 2018-07-11 MED ORDER — PREGABALIN 100 MG PO CAPS: 100.0000 mg | ORAL_CAPSULE | Freq: Two times a day (BID) | ORAL | 3 refills | Status: DC

## 2018-07-11 MED ORDER — OXYCODONE-ACETAMINOPHEN 10-325 MG PO TABS: 1.0000 | ORAL_TABLET | Freq: Three times a day (TID) | ORAL | 0 refills | Status: DC | PRN

## 2018-07-11 MED ORDER — FENTANYL 12 MCG/HR TD PT72: 1.0000 | MEDICATED_PATCH | TRANSDERMAL | 0 refills | Status: DC

## 2018-07-11 NOTE — Progress Notes (Signed)
Subjective:    Patient ID: Krista Doyle, female    DOB: 01/28/1961, 58 y.o.   MRN: [id number]  HPI: Krista Doyle is a 58 y.o. female who returns for follow up appointment for chronic pain and medication refill. She states her pain is located in her mid- lower back radiating into her right hip and right lower extremity. She rates her pain 6. Her current exercise regime is walking and performing stretching exercises.  Ms. Kirshner Morphine equivalent is 73.80MME.  Last Oral Swab was Performed on 03/07/2018, it was consistent.    Pain Inventory Average Pain 7 Pain Right Now 6 My pain is tingling and aching  In the last 24 hours, has pain interfered with the following? General activity 7 Relation with others 7 Enjoyment of life 8 What TIME of day is your pain at its worst? evening and night Sleep (in general) Fair  Pain is worse with: walking, bending, sitting and standing Pain improves with: rest, heat/ice, medication and TENS Relief from Meds: 6  Mobility use a cane use a walker how many minutes can you walk? 10 ability to climb steps?  yes do you drive?  yes  Function disabled: date disabled 2008  Neuro/Psych weakness numbness tingling trouble walking  Prior Studies Any changes since last visit?  no  Physicians involved in your care Primary care Dr. [name]   Family History  Problem Relation Age of Onset  . Colon cancer Neg Hx    Social History   Socioeconomic History  . Marital status: Married    Spouse name: Not on file  . Number of children: Not on file  . Years of education: Not on file  . Highest education level: Not on file  Occupational History  . Occupation: Disabled  Social Needs  . Financial resource strain: Not on file  . Food insecurity:    Worry: Not on file    Inability: Not on file  . Transportation needs:    Medical: Not on file    Non-medical: Not on file  Tobacco Use  . Smoking status: Never Smoker   . Smokeless tobacco: Never Used  Substance and Sexual Activity  . Alcohol use: No    Alcohol/week: 0.0 standard drinks  . Drug use: No  . Sexual activity: Yes  Lifestyle  . Physical activity:    Days per week: Not on file    Minutes per session: Not on file  . Stress: Not on file  Relationships  . Social connections:    Talks on phone: Not on file    Gets together: Not on file    Attends religious service: Not on file    Active member of club or organization: Not on file    Attends meetings of clubs or organizations: Not on file    Relationship status: Not on file  Other Topics Concern  . Not on file  Social History Narrative  . Not on file   Past Surgical History:  Procedure Laterality Date  . BACK SURGERY    . BREAST LUMPECTOMY    . COLONOSCOPY     about 3 yrs ago in [place]  . KNEE SURGERY     right  . TONSILLECTOMY     Past Medical History:  Diagnosis Date  . Asthma   . Back pain   . Breast cancer ([place]) 2008   left  . Diabetes ([place])   . Hypercholesterolemia   . Hypertension   . Neuropathy  extremities after chemo  . Personal history of chemotherapy   . Personal history of radiation therapy 2008   BP 131/81   Pulse 100   Temp (!) 96 F (35.6 C)   Ht 5\' 4"  (1.626 m)   Wt 144 lb 12.8 oz (65.7 kg)   SpO2 96%   BMI 24.85 kg/m   Opioid Risk Score:   Fall Risk Score:  `1  Depression screen PHQ 2/9  Depression screen [facility] 2/9 12/11/2017 11/07/2017 04/21/2017 01/13/2017 05/27/2016 03/28/2016 10/07/2015  Decreased Interest 0 0 - 0 0 0 0  Down, Depressed, Hopeless 0 0 - 0 0 0 0  PHQ - 2 Score 0 0 - 0 0 0 0  Altered sleeping - - 0 - - - -  Tired, decreased energy - - 0 - - - -  Change in appetite - - - - - - -  Feeling bad or failure about yourself  - - - - - - -  Trouble concentrating - - - - - - -  Moving slowly or fidgety/restless - - - - - - -  Suicidal thoughts - - - - - - -  PHQ-9 Score - - - - - - -  Difficult doing work/chores - - - - - - -    Review of Systems  Constitutional: Negative.   HENT: Negative.   Eyes: Negative.   Respiratory: Negative.   Cardiovascular: Negative.   Gastrointestinal: Negative.   Endocrine: Negative.   Genitourinary: Negative.   Musculoskeletal: Negative.   Skin: Negative.   Allergic/Immunologic: Negative.   Neurological: Positive for weakness and numbness.  Hematological: Negative.   Psychiatric/Behavioral: Negative.   All other systems reviewed and are negative.      Objective:   Physical Exam Vitals signs and nursing note reviewed.  Constitutional:      Appearance: Normal appearance.  Neck:     Musculoskeletal: Normal range of motion and neck supple.  Cardiovascular:     Rate and Rhythm: Normal rate and regular rhythm.     Pulses: Normal pulses.     Heart sounds: Normal heart sounds.  Pulmonary:     Effort: Pulmonary effort is normal.     Breath sounds: Normal breath sounds.  Musculoskeletal:     Comments: Normal Muscle Bulk and Muscle Testing Reveals:  Upper Extremities: Right: Full ROM and Muscle Strength 4/5 Left: Decreased ROM 45 Degrees and Muscle Strength 3/5   Thoracic Paraspinal Tenderness: T-1-T-3 T-7-T-9 Lumbar  Hypersensitivity Right Greater Trochanter Tenderness Lower Extremities: Right: Decreased ROM and Muscle Strength 5/5 Right Lower Extremity Flexion Produces Pain into her right hip and lower back mainly right side.  Left: Full ROM and Muscle Strength 5/5 Arises from chair slowly using cane for support Antalgic Gait   Skin:    General: Skin is warm and dry.  Neurological:     Mental Status: She is alert and oriented to person, place, and time.           Assessment & Plan:  1. Chronic low back pain, Lumbar Facet Arthropathy,lumbar spondylosis, spondylolisthesis, with lumbar radiculopathy.Continuecurrentmedication regimen:07/11/2018 Refilled: Fentanyl 12 mcg one patch every three days #10 and Oxycodone 10/325 mg one tablet every 8 hours as needed  for pain #90.  We will continue the opioid monitoring program, this consists of regular clinic visits, examinations, urine drug screen, pill counts as well as use of [place] Controlled Substance reporting System. 2. Chemotherapy induced peripheral neuropathy:ContinueLyrica. 07/11/2018 3. Chronic Midline Thoracic Pain: Continue  HEP and Current Medication Regime.07/11/2018 4. Muscle Spasm: Continuemedication regimen withRobaxin06/11/2018 5. Right Knee Pain:No complaints today. Continue Current Medication Regime: Continue HEP as ToleratedandContinue to Monitor.07/11/2018 6. Right Greater Trochanteric Bursitis: Continue to Alternate Ice and Heat Therapy.07/11/2018 7. Chronic Pain Syndrome: ContinueAmitriptyline, Fentanyly and Oxycodone. Continue to Monitor.07/11/2018  F/U in 1 month  15  minutes of face to face patient care time was spent during this visit. All questions were encouraged and answered.

## 2018-07-12 ENCOUNTER — Encounter: Payer: Self-pay | Admitting: Registered Nurse

## 2018-08-08 ENCOUNTER — Encounter: Payer: BC Managed Care – PPO | Admitting: Registered Nurse

## 2018-08-14 ENCOUNTER — Other Ambulatory Visit: Payer: Self-pay

## 2018-08-14 ENCOUNTER — Encounter: Payer: BC Managed Care – PPO | Attending: Physical Medicine & Rehabilitation | Admitting: Registered Nurse

## 2018-08-14 ENCOUNTER — Encounter: Payer: Self-pay | Admitting: Registered Nurse

## 2018-08-14 VITALS — BP 106/67 | HR 78 | Temp 97.7°F | Resp 12 | Ht 64.0 in | Wt 142.0 lb

## 2018-08-14 DIAGNOSIS — G8929 Other chronic pain: Secondary | ICD-10-CM

## 2018-08-14 DIAGNOSIS — G894 Chronic pain syndrome: Secondary | ICD-10-CM | POA: Diagnosis not present

## 2018-08-14 DIAGNOSIS — M7061 Trochanteric bursitis, right hip: Secondary | ICD-10-CM | POA: Diagnosis not present

## 2018-08-14 DIAGNOSIS — Z9221 Personal history of antineoplastic chemotherapy: Secondary | ICD-10-CM | POA: Insufficient documentation

## 2018-08-14 DIAGNOSIS — M47816 Spondylosis without myelopathy or radiculopathy, lumbar region: Secondary | ICD-10-CM | POA: Diagnosis not present

## 2018-08-14 DIAGNOSIS — Z853 Personal history of malignant neoplasm of breast: Secondary | ICD-10-CM | POA: Insufficient documentation

## 2018-08-14 DIAGNOSIS — M62838 Other muscle spasm: Secondary | ICD-10-CM

## 2018-08-14 DIAGNOSIS — M546 Pain in thoracic spine: Secondary | ICD-10-CM

## 2018-08-14 DIAGNOSIS — G620 Drug-induced polyneuropathy: Secondary | ICD-10-CM | POA: Diagnosis not present

## 2018-08-14 DIAGNOSIS — Z79899 Other long term (current) drug therapy: Secondary | ICD-10-CM

## 2018-08-14 DIAGNOSIS — T451X5A Adverse effect of antineoplastic and immunosuppressive drugs, initial encounter: Secondary | ICD-10-CM

## 2018-08-14 DIAGNOSIS — I890 Lymphedema, not elsewhere classified: Secondary | ICD-10-CM | POA: Insufficient documentation

## 2018-08-14 DIAGNOSIS — M545 Low back pain: Secondary | ICD-10-CM | POA: Insufficient documentation

## 2018-08-14 DIAGNOSIS — M5416 Radiculopathy, lumbar region: Secondary | ICD-10-CM

## 2018-08-14 DIAGNOSIS — Z5181 Encounter for therapeutic drug level monitoring: Secondary | ICD-10-CM

## 2018-08-14 MED ORDER — FENTANYL 12 MCG/HR TD PT72: 1.0000 | MEDICATED_PATCH | TRANSDERMAL | 0 refills | Status: DC

## 2018-08-14 MED ORDER — OXYCODONE-ACETAMINOPHEN 10-325 MG PO TABS: 1.0000 | ORAL_TABLET | Freq: Three times a day (TID) | ORAL | 0 refills | Status: DC | PRN

## 2018-08-14 NOTE — Progress Notes (Signed)
Subjective:    Patient ID: Krista Doyle, female    DOB: [birth year], 58 y.o.   MRN: [id number]  HPI: Krista Doyle is a 58 y.o. female who returns for follow up appointment for chronic pain and medication refill. She states her pain is located in  Her mid- lower back radiating into her right lower extremity and right hip pain. She rates her pain 5. Her current exercise regime is walking and ball therapy three times a week.   Krista Doyle Morphine equivalent is 73.80  MME.  Last Oral Swab was Performed on 03/07/2018, it was consistent.   Pain Inventory Average Pain 7 Pain Right Now 5 My pain is sharp, stabbing and aching  In the last 24 hours, has pain interfered with the following? General activity 6 Relation with others 7 Enjoyment of life 7 What TIME of day is your pain at its worst? evening,night Sleep (in general) Fair  Pain is worse with: walking, bending, sitting and standing Pain improves with: rest, medication and TENS Relief from Meds: 7  Mobility walk with assistance use a cane use a walker ability to climb steps?  yes do you drive?  yes  Function disabled: date disabled n/a I need assistance with the following:  household duties  Neuro/Psych weakness numbness tingling spasms  Prior Studies no  Physicians involved in your care no   Family History  Problem Relation Age of Onset  . Colon cancer Neg Hx    Social History   Socioeconomic History  . Marital status: Married    Spouse name: Not on file  . Number of children: Not on file  . Years of education: Not on file  . Highest education level: Not on file  Occupational History  . Occupation: Disabled  Social Needs  . Financial resource strain: Not on file  . Food insecurity    Worry: Not on file    Inability: Not on file  . Transportation needs    Medical: Not on file    Non-medical: Not on file  Tobacco Use  . Smoking status: Never Smoker  . Smokeless tobacco: Never  Used  Substance and Sexual Activity  . Alcohol use: No    Alcohol/week: 0.0 standard drinks  . Drug use: No  . Sexual activity: Yes  Lifestyle  . Physical activity    Days per week: Not on file    Minutes per session: Not on file  . Stress: Not on file  Relationships  . Social Herbalist on phone: Not on file    Gets together: Not on file    Attends religious service: Not on file    Active member of club or organization: Not on file    Attends meetings of clubs or organizations: Not on file    Relationship status: Not on file  Other Topics Concern  . Not on file  Social History Narrative  . Not on file   Past Surgical History:  Procedure Laterality Date  . BACK SURGERY    . BREAST LUMPECTOMY    . COLONOSCOPY     about 3 yrs ago in [place]  . KNEE SURGERY     right  . TONSILLECTOMY     Past Medical History:  Diagnosis Date  . Asthma   . Back pain   . Breast cancer ([place]) 2008   left  . Diabetes ([place])   . Hypercholesterolemia   . Hypertension   . Neuropathy  extremities after chemo  . Personal history of chemotherapy   . Personal history of radiation therapy 2008   There were no vitals taken for this visit.  Opioid Risk Score:   Fall Risk Score:  `1  Depression screen PHQ 2/9  Depression screen [facility] 2/9 12/11/2017 11/07/2017 04/21/2017 01/13/2017 05/27/2016 03/28/2016 10/07/2015  Decreased Interest 0 0 - 0 0 0 0  Down, Depressed, Hopeless 0 0 - 0 0 0 0  PHQ - 2 Score 0 0 - 0 0 0 0  Altered sleeping - - 0 - - - -  Tired, decreased energy - - 0 - - - -  Change in appetite - - - - - - -  Feeling bad or failure about yourself  - - - - - - -  Trouble concentrating - - - - - - -  Moving slowly or fidgety/restless - - - - - - -  Suicidal thoughts - - - - - - -  PHQ-9 Score - - - - - - -  Difficult doing work/chores - - - - - - -      Review of Systems  Cardiovascular:       Limb swelling  All other systems reviewed and are negative.       Objective:   Physical Exam Vitals signs and nursing note reviewed.  Constitutional:      Appearance: Normal appearance.  Neck:     Musculoskeletal: Normal range of motion and neck supple.  Cardiovascular:     Rate and Rhythm: Normal rate and regular rhythm.     Pulses: Normal pulses.     Heart sounds: Normal heart sounds.  Pulmonary:     Effort: Pulmonary effort is normal.     Breath sounds: Normal breath sounds.  Musculoskeletal:     Comments: Normal Muscle Bulk and Muscle Testing Reveals:  Upper Extremities:Right: Full  ROM and Muscle Strength 4/5 Left: Decreased ROM 45 Degrees and Muscle Strength 3/5 Bilateral AC Joint Tenderness   Thoracic Hypersensitivity T-1-T-3 T-7-T-9  Lumbar Paraspinal Tenderness: L-4-L-5 Mainly Right Side Lower Extremities: Right: Decreased ROM and Muscle Strength 5/5  Right Lower Extremity Flexion Produces Pain into Right Hip Left: Full ROM and Muscle Strength 5/5 Arises from Table slowly using cane for support Antalgic Gait   Skin:    General: Skin is warm and dry.  Neurological:     Mental Status: She is alert and oriented to person, place, and time.  Psychiatric:        Mood and Affect: Mood normal.        Behavior: Behavior normal.           Assessment & Plan:  1. Chronic low back pain, Lumbar Facet Arthropathy,lumbar spondylosis, spondylolisthesis, with lumbar radiculopathy.Continuecurrentmedication regimen:08/14/2018 Refilled: Fentanyl 12 mcg one patch every three days #10 and Oxycodone 10/325 mg one tablet every 8 hours as needed for pain #90.  We will continue the opioid monitoring program, this consists of regular clinic visits, examinations, urine drug screen, pill counts as well as use of [place] Controlled Substance reporting System. 2. Chemotherapy induced peripheral neuropathy:ContinueLyrica. 08/14/2018 3. Chronic Midline Thoracic Pain: Continue HEP and Current Medication Regime.08/14/2018 4. Muscle Spasm:  Continuemedication regimen withRobaxin07/14/2020 5. Right Knee Pain:No complaints today.Continue Current Medication Regime: Continue HEP as ToleratedandContinue to Monitor.08/14/2018 6. Right Greater Trochanteric Bursitis:Continue to Alternate Ice and Heat Therapy.08/14/2018 7. Chronic Pain Syndrome: ContinueAmitriptyline, Fentanyly and Oxycodone. Continue to Monitor.08/14/2018  F/U in 1 month  15  minutes  of face to face patient care time was spent during this visit. All questions were encouraged and answered.

## 2018-08-20 DIAGNOSIS — E1140 Type 2 diabetes mellitus with diabetic neuropathy, unspecified: Secondary | ICD-10-CM | POA: Diagnosis not present

## 2018-08-20 DIAGNOSIS — B351 Tinea unguium: Secondary | ICD-10-CM | POA: Diagnosis not present

## 2018-09-07 ENCOUNTER — Encounter: Payer: BC Managed Care – PPO | Attending: Physical Medicine & Rehabilitation | Admitting: Registered Nurse

## 2018-09-07 ENCOUNTER — Encounter: Payer: Self-pay | Admitting: Registered Nurse

## 2018-09-07 ENCOUNTER — Other Ambulatory Visit: Payer: Self-pay

## 2018-09-07 VITALS — BP 139/93 | HR 86 | Temp 97.7°F | Resp 14 | Ht 64.0 in | Wt 143.6 lb

## 2018-09-07 DIAGNOSIS — I890 Lymphedema, not elsewhere classified: Secondary | ICD-10-CM | POA: Insufficient documentation

## 2018-09-07 DIAGNOSIS — Z79899 Other long term (current) drug therapy: Secondary | ICD-10-CM | POA: Diagnosis not present

## 2018-09-07 DIAGNOSIS — M47816 Spondylosis without myelopathy or radiculopathy, lumbar region: Secondary | ICD-10-CM

## 2018-09-07 DIAGNOSIS — M545 Low back pain: Secondary | ICD-10-CM | POA: Diagnosis not present

## 2018-09-07 DIAGNOSIS — M546 Pain in thoracic spine: Secondary | ICD-10-CM

## 2018-09-07 DIAGNOSIS — Z853 Personal history of malignant neoplasm of breast: Secondary | ICD-10-CM | POA: Insufficient documentation

## 2018-09-07 DIAGNOSIS — G894 Chronic pain syndrome: Secondary | ICD-10-CM

## 2018-09-07 DIAGNOSIS — Z9221 Personal history of antineoplastic chemotherapy: Secondary | ICD-10-CM | POA: Diagnosis not present

## 2018-09-07 DIAGNOSIS — T451X5A Adverse effect of antineoplastic and immunosuppressive drugs, initial encounter: Secondary | ICD-10-CM

## 2018-09-07 DIAGNOSIS — M5416 Radiculopathy, lumbar region: Secondary | ICD-10-CM | POA: Diagnosis not present

## 2018-09-07 DIAGNOSIS — M7061 Trochanteric bursitis, right hip: Secondary | ICD-10-CM | POA: Diagnosis not present

## 2018-09-07 DIAGNOSIS — M62838 Other muscle spasm: Secondary | ICD-10-CM

## 2018-09-07 DIAGNOSIS — Z5181 Encounter for therapeutic drug level monitoring: Secondary | ICD-10-CM

## 2018-09-07 DIAGNOSIS — G620 Drug-induced polyneuropathy: Secondary | ICD-10-CM | POA: Diagnosis not present

## 2018-09-07 DIAGNOSIS — G8929 Other chronic pain: Secondary | ICD-10-CM

## 2018-09-07 MED ORDER — AMITRIPTYLINE HCL 10 MG PO TABS: ORAL_TABLET | ORAL | 1 refills | Status: DC

## 2018-09-07 MED ORDER — FENTANYL 12 MCG/HR TD PT72: 1.0000 | MEDICATED_PATCH | TRANSDERMAL | 0 refills | Status: DC

## 2018-09-07 MED ORDER — OXYCODONE-ACETAMINOPHEN 10-325 MG PO TABS: 1.0000 | ORAL_TABLET | Freq: Three times a day (TID) | ORAL | 0 refills | Status: DC | PRN

## 2018-09-07 NOTE — Progress Notes (Signed)
Subjective:    Patient ID: Krista Doyle, female    DOB: [birth year], 58 y.o.   MRN: [id number]  HPI: Krista Doyle is a 58 y.o. female who returns for follow up appointment for chronic pain and medication refill. She states her pain is located in her mid- lower back radiating into her right hip and right lower extremity. She rates her pain 6. Her current exercise regime is walking and performing stretching exercises.  Krista Doyle Morphine equivalent is 73.80 MME.  Last Oral Swab was Performed on 03/07/2018, it was consistent.   Pain Inventory Average Pain 6 Pain Right Now 6 My pain is sharp, stabbing and aching  In the last 24 hours, has pain interfered with the following? General activity 7 Relation with others 6 Enjoyment of life 7 What TIME of day is your pain at its worst? evening Sleep (in general) Fair  Pain is worse with: walking, bending, sitting and standing Pain improves with: rest, heat/ice and medication Relief from Meds: 7  Mobility walk with assistance use a cane use a walker how many minutes can you walk? 10 ability to climb steps?  yes do you drive?  yes  Function disabled: date disabled .  Neuro/Psych weakness numbness tingling  Prior Studies Any changes since last visit?  no  Physicians involved in your care Any changes since last visit?  no   Family History  Problem Relation Age of Onset  . Colon cancer Neg Hx    Social History   Socioeconomic History  . Marital status: Married    Spouse name: Not on file  . Number of children: Not on file  . Years of education: Not on file  . Highest education level: Not on file  Occupational History  . Occupation: Disabled  Social Needs  . Financial resource strain: Not on file  . Food insecurity    Worry: Not on file    Inability: Not on file  . Transportation needs    Medical: Not on file    Non-medical: Not on file  Tobacco Use  . Smoking status: Never Smoker  .  Smokeless tobacco: Never Used  Substance and Sexual Activity  . Alcohol use: No    Alcohol/week: 0.0 standard drinks  . Drug use: No  . Sexual activity: Yes  Lifestyle  . Physical activity    Days per week: Not on file    Minutes per session: Not on file  . Stress: Not on file  Relationships  . Social Herbalist on phone: Not on file    Gets together: Not on file    Attends religious service: Not on file    Active member of club or organization: Not on file    Attends meetings of clubs or organizations: Not on file    Relationship status: Not on file  Other Topics Concern  . Not on file  Social History Narrative  . Not on file   Past Surgical History:  Procedure Laterality Date  . BACK SURGERY    . BREAST LUMPECTOMY    . COLONOSCOPY     about 3 yrs ago in [place]  . KNEE SURGERY     right  . TONSILLECTOMY     Past Medical History:  Diagnosis Date  . Asthma   . Back pain   . Breast cancer ([place]) 2008   left  . Diabetes ([place])   . Hypercholesterolemia   . Hypertension   . Neuropathy  extremities after chemo  . Personal history of chemotherapy   . Personal history of radiation therapy 2008   There were no vitals taken for this visit.  Opioid Risk Score:   Fall Risk Score:  `1  Depression screen PHQ 2/9  Depression screen [facility] 2/9 12/11/2017 11/07/2017 04/21/2017 01/13/2017 05/27/2016 03/28/2016 10/07/2015  Decreased Interest 0 0 - 0 0 0 0  Down, Depressed, Hopeless 0 0 - 0 0 0 0  PHQ - 2 Score 0 0 - 0 0 0 0  Altered sleeping - - 0 - - - -  Tired, decreased energy - - 0 - - - -  Change in appetite - - - - - - -  Feeling bad or failure about yourself  - - - - - - -  Trouble concentrating - - - - - - -  Moving slowly or fidgety/restless - - - - - - -  Suicidal thoughts - - - - - - -  PHQ-9 Score - - - - - - -  Difficult doing work/chores - - - - - - -     Review of Systems  Constitutional: Negative.   HENT: Negative.   Eyes: Negative.    Respiratory: Negative.   Cardiovascular: Negative.   Endocrine: Negative.   Genitourinary: Negative.   Musculoskeletal: Positive for back pain, gait problem and myalgias.  Skin: Negative.   Allergic/Immunologic: Negative.   Neurological: Positive for weakness and numbness.  Psychiatric/Behavioral: Negative.        Objective:   Physical Exam Vitals signs and nursing note reviewed.  Constitutional:      Appearance: Normal appearance.  Neck:     Musculoskeletal: Normal range of motion and neck supple.  Cardiovascular:     Rate and Rhythm: Normal rate and regular rhythm.     Pulses: Normal pulses.     Heart sounds: Normal heart sounds.  Pulmonary:     Effort: Pulmonary effort is normal.     Breath sounds: Normal breath sounds.  Musculoskeletal:     Comments: Normal Muscle Bulk and Muscle Testing Reveals:  Upper Extremities:Right: Full ROM and Muscle Strength 5/5 Left: Decreased ROM 45 Degrees and Muscle Strength 4/5  Thoracic Paraspinal Tenderness: T-3-T-7  Lumbar Paraspinal Tenderness: L-4-L-5 Lower Extremities: Right: Decreased ROM and Muscle Strength 4/5  Right Lower Extremity Flexion Produces Pain into Extremity Left: Full ROM and Muscle Strength 5/5 Arises from table slowly using cane for support Narrow Based  Gait   Skin:    General: Skin is warm and dry.  Neurological:     Mental Status: She is alert and oriented to person, place, and time.  Psychiatric:        Mood and Affect: Mood normal.        Behavior: Behavior normal.           Assessment & Plan:  1. Chronic low back pain, Lumbar Facet Arthropathy,lumbar spondylosis, spondylolisthesis, with lumbar radiculopathy.Continuecurrentmedication regimen:09/07/2018 Refilled: Fentanyl 12 mcg one patch every three days #10 and Oxycodone 10/325 mg one tablet every 8 hours as needed for pain #90.  We will continue the opioid monitoring program, this consists of regular clinic visits, examinations, urine drug  screen, pill counts as well as use of [place] Controlled Substance reporting System. 2. Chemotherapy induced peripheral neuropathy:ContinueLyrica. 09/07/2018 3. Chronic Midline Thoracic Pain: Continue HEP and Current Medication Regime.09/07/2018 4. Muscle Spasm: Continuemedication regimen withRobaxin08/08/2018 5. Right Knee Pain:No complaints today.Continue Current Medication Regime: Continue HEP as ToleratedandContinue to Monitor.09/07/2018  6. Right Greater Trochanteric Bursitis:Continue to Alternate Ice and Heat Therapy.09/07/2018 7. Chronic Pain Syndrome: ContinueAmitriptyline, Fentanyly and Oxycodone. Continue to Monitor.09/07/2018  F/U in 1 month  37minutes of face to face patient care time was spent during this visit. All questions were encouraged and answered.

## 2018-09-17 ENCOUNTER — Other Ambulatory Visit (HOSPITAL_COMMUNITY): Payer: Self-pay | Admitting: *Deleted

## 2018-09-17 DIAGNOSIS — C50412 Malignant neoplasm of upper-outer quadrant of left female breast: Secondary | ICD-10-CM

## 2018-09-17 DIAGNOSIS — M8580 Other specified disorders of bone density and structure, unspecified site: Secondary | ICD-10-CM

## 2018-09-17 DIAGNOSIS — Z170 Estrogen receptor positive status [ER+]: Secondary | ICD-10-CM

## 2018-09-18 ENCOUNTER — Other Ambulatory Visit (HOSPITAL_COMMUNITY): Payer: BLUE CROSS/BLUE SHIELD

## 2018-09-19 ENCOUNTER — Inpatient Hospital Stay (HOSPITAL_COMMUNITY): Payer: BC Managed Care – PPO | Attending: Nurse Practitioner

## 2018-09-19 ENCOUNTER — Other Ambulatory Visit: Payer: Self-pay

## 2018-09-19 DIAGNOSIS — Z853 Personal history of malignant neoplasm of breast: Secondary | ICD-10-CM | POA: Insufficient documentation

## 2018-09-19 DIAGNOSIS — Z170 Estrogen receptor positive status [ER+]: Secondary | ICD-10-CM | POA: Insufficient documentation

## 2018-09-19 DIAGNOSIS — M8580 Other specified disorders of bone density and structure, unspecified site: Secondary | ICD-10-CM | POA: Diagnosis not present

## 2018-09-19 DIAGNOSIS — I890 Lymphedema, not elsewhere classified: Secondary | ICD-10-CM | POA: Insufficient documentation

## 2018-09-19 DIAGNOSIS — Z79899 Other long term (current) drug therapy: Secondary | ICD-10-CM | POA: Diagnosis not present

## 2018-09-19 DIAGNOSIS — Z9221 Personal history of antineoplastic chemotherapy: Secondary | ICD-10-CM | POA: Diagnosis not present

## 2018-09-19 DIAGNOSIS — Z9223 Personal history of estrogen therapy: Secondary | ICD-10-CM | POA: Insufficient documentation

## 2018-09-19 DIAGNOSIS — M549 Dorsalgia, unspecified: Secondary | ICD-10-CM | POA: Insufficient documentation

## 2018-09-19 DIAGNOSIS — G8929 Other chronic pain: Secondary | ICD-10-CM | POA: Diagnosis not present

## 2018-09-19 DIAGNOSIS — C50412 Malignant neoplasm of upper-outer quadrant of left female breast: Secondary | ICD-10-CM

## 2018-09-19 LAB — CBC WITH DIFFERENTIAL/PLATELET
Abs Immature Granulocytes: 0.01 10*3/uL (ref 0.00–0.07)
Basophils Absolute: 0.1 10*3/uL (ref 0.0–0.1)
Basophils Relative: 1 %
Eosinophils Absolute: 0.2 10*3/uL (ref 0.0–0.5)
Eosinophils Relative: 3 %
HCT: 39.1 % (ref 36.0–46.0)
Hemoglobin: 11.9 g/dL — ABNORMAL LOW (ref 12.0–15.0)
Immature Granulocytes: 0 %
Lymphocytes Relative: 35 %
Lymphs Abs: 2.1 10*3/uL (ref 0.7–4.0)
MCH: 25.9 pg — ABNORMAL LOW (ref 26.0–34.0)
MCHC: 30.4 g/dL (ref 30.0–36.0)
MCV: 85.2 fL (ref 80.0–100.0)
Monocytes Absolute: 0.5 10*3/uL (ref 0.1–1.0)
Monocytes Relative: 9 %
Neutro Abs: 3.2 10*3/uL (ref 1.7–7.7)
Neutrophils Relative %: 52 %
Platelets: 249 10*3/uL (ref 150–400)
RBC: 4.59 MIL/uL (ref 3.87–5.11)
RDW: 13.6 % (ref 11.5–15.5)
WBC: 6.1 10*3/uL (ref 4.0–10.5)
nRBC: 0 % (ref 0.0–0.2)

## 2018-09-19 LAB — COMPREHENSIVE METABOLIC PANEL
ALT: 13 U/L (ref 0–44)
AST: 17 U/L (ref 15–41)
Albumin: 3.9 g/dL (ref 3.5–5.0)
Alkaline Phosphatase: 49 U/L (ref 38–126)
Anion gap: 8 (ref 5–15)
BUN: 10 mg/dL (ref 6–20)
CO2: 29 mmol/L (ref 22–32)
Calcium: 9.9 mg/dL (ref 8.9–10.3)
Chloride: 103 mmol/L (ref 98–111)
Creatinine, Ser: 0.8 mg/dL (ref 0.44–1.00)
GFR calc Af Amer: >60 mL/min (ref >60)
GFR calc non Af Amer: >60 mL/min (ref >60)
Glucose, Bld: 105 mg/dL — ABNORMAL HIGH (ref 70–99)
Potassium: 3.9 mmol/L (ref 3.5–5.1)
Sodium: 140 mmol/L (ref 135–145)
Total Bilirubin: 0.6 mg/dL (ref 0.3–1.2)
Total Protein: 7.7 g/dL (ref 6.5–8.1)

## 2018-09-19 LAB — LACTATE DEHYDROGENASE: LDH: 152 U/L (ref 98–192)

## 2018-09-20 LAB — PROTEIN ELECTROPHORESIS, SERUM
A/G Ratio: 0.9 (ref 0.7–1.7)
Albumin ELP: 3.3 g/dL (ref 2.9–4.4)
Alpha-1-Globulin: 0.2 g/dL (ref 0.0–0.4)
Alpha-2-Globulin: 0.8 g/dL (ref 0.4–1.0)
Beta Globulin: 1 g/dL (ref 0.7–1.3)
Gamma Globulin: 1.5 g/dL (ref 0.4–1.8)
Globulin, Total: 3.5 g/dL (ref 2.2–3.9)
Total Protein ELP: 6.8 g/dL (ref 6.0–8.5)

## 2018-09-25 ENCOUNTER — Inpatient Hospital Stay (HOSPITAL_BASED_OUTPATIENT_CLINIC_OR_DEPARTMENT_OTHER): Payer: BC Managed Care – PPO | Admitting: Nurse Practitioner

## 2018-09-25 ENCOUNTER — Inpatient Hospital Stay (HOSPITAL_COMMUNITY): Payer: BC Managed Care – PPO

## 2018-09-25 ENCOUNTER — Encounter (HOSPITAL_COMMUNITY): Payer: Self-pay | Admitting: Nurse Practitioner

## 2018-09-25 ENCOUNTER — Other Ambulatory Visit: Payer: Self-pay

## 2018-09-25 VITALS — BP 134/83 | HR 81 | Temp 97.7°F | Resp 18 | Wt 139.0 lb

## 2018-09-25 DIAGNOSIS — Z853 Personal history of malignant neoplasm of breast: Secondary | ICD-10-CM | POA: Diagnosis not present

## 2018-09-25 DIAGNOSIS — Z79899 Other long term (current) drug therapy: Secondary | ICD-10-CM

## 2018-09-25 DIAGNOSIS — C50412 Malignant neoplasm of upper-outer quadrant of left female breast: Secondary | ICD-10-CM | POA: Diagnosis not present

## 2018-09-25 DIAGNOSIS — Z9223 Personal history of estrogen therapy: Secondary | ICD-10-CM | POA: Diagnosis not present

## 2018-09-25 DIAGNOSIS — Z9221 Personal history of antineoplastic chemotherapy: Secondary | ICD-10-CM | POA: Diagnosis not present

## 2018-09-25 DIAGNOSIS — M8580 Other specified disorders of bone density and structure, unspecified site: Secondary | ICD-10-CM | POA: Diagnosis not present

## 2018-09-25 DIAGNOSIS — Z170 Estrogen receptor positive status [ER+]: Secondary | ICD-10-CM | POA: Diagnosis not present

## 2018-09-25 DIAGNOSIS — Z9889 Other specified postprocedural states: Secondary | ICD-10-CM | POA: Diagnosis not present

## 2018-09-25 DIAGNOSIS — Z780 Asymptomatic menopausal state: Secondary | ICD-10-CM

## 2018-09-25 DIAGNOSIS — M549 Dorsalgia, unspecified: Secondary | ICD-10-CM | POA: Diagnosis not present

## 2018-09-25 DIAGNOSIS — I890 Lymphedema, not elsewhere classified: Secondary | ICD-10-CM | POA: Diagnosis not present

## 2018-09-25 DIAGNOSIS — G8929 Other chronic pain: Secondary | ICD-10-CM | POA: Diagnosis not present

## 2018-09-25 MED ORDER — LETROZOLE 2.5 MG PO TABS: 2.5000 mg | ORAL_TABLET | Freq: Every day | ORAL | 3 refills | Status: AC

## 2018-09-25 MED ORDER — DENOSUMAB 60 MG/ML ~~LOC~~ SOSY
60.0000 mg | PREFILLED_SYRINGE | Freq: Once | SUBCUTANEOUS | Status: AC
  Administered 2018-09-25: 60 mg via SUBCUTANEOUS
  Filled 2018-09-25: qty 1

## 2018-09-25 NOTE — Patient Instructions (Signed)
[place] at [facility] Discharge Instructions  Follow-up in 6 months with mammogram, DEXA scan, and labs.   Thank you for choosing [place] at [facility] to provide your oncology and hematology care.  To afford each patient quality time with our provider, please arrive at least 15 minutes before your scheduled appointment time.   If you have a lab appointment with the [facility] please come in thru the Main Entrance and check in at the main information desk.  You need to re-schedule your appointment should you arrive 10 or more minutes late.  We strive to give you quality time with our providers, and arriving late affects you and other patients whose appointments are after yours.  Also, if you no show three or more times for appointments you may be dismissed from the clinic at the providers discretion.     Again, thank you for choosing [facility].  Our hope is that these requests will decrease the amount of time that you wait before being seen by our physicians.       _____________________________________________________________  Should you have questions after your visit to [facility], please contact our office at [phone] between the hours of 8:00 a.m. and 4:30 p.m.  Voicemails left after 4:00 p.m. will not be returned until the following business day.  For prescription refill requests, have your pharmacy contact our office and allow 72 hours.    Due to Covid, you will need to wear a mask upon entering the hospital. If you do not have a mask, a mask will be given to you at the Main Entrance upon arrival. For doctor visits, patients may have 1 support person with them. For treatment visits, patients can not have anyone with them due to social distancing guidelines and our immunocompromised population.

## 2018-09-25 NOTE — Assessment & Plan Note (Signed)
1.  Left breast cancer: - She was diagnosed with breast cancer originally in 2008.  ER+, PR+, HER-2+ -She reportedly completed 1 year of Herceptin. - She took tamoxifen for 5 years.  She was previously followed by Dr. Talbert Cage. - She completed 5 years of Femara in 2018.  She is planning to stay on Femara for additional 5 years which will be complete 2023. -Last mammogram done on 03/14/2018 was BI-RADS Category 1 negative. -Labs done on 09/19/2018 showed potassium 3.9, creatinine 0.80, LDH 152, WBC 6.1, hemoglobin 11.9, platelets 249.  M spike not observed. -Patient reports she does have mild lymphedema in her left arm.  She uses her machine twice daily and wears a sleeve both which help. - Physical assessment today did not reveal any palpable masses or adenopathy. -We will see her back in 6 months with repeat mammogram and labs.  2.  Back pain: -Patient has chronic back pain. -She had an MRI of the spine done on 10/17/2014 that showed spinal stenosis. -She had a bone scan done on 05/10/2017 showed question of compression fractures. - She underwent a thoracic x-ray as well as sternal x-rays on 09/20/2017 that showed DJD and osteopenia. -She is being followed by pain management and may need an orthopedic evaluation if symptoms ongoing. - Patient was previously followed by Dr. Walden Field.  She did an SPEP which was negative.  3.  Osteopenia: -Her bone density test done on 03/08/2017 showed osteopenia. -She is being treated with Prolia every 6 months. - She will continue taking calcium and vitamin D daily. - We will repeat a bone density test in 03/2019

## 2018-09-25 NOTE — Progress Notes (Signed)
Patient tolerated injection with no complaints voiced.  Site clean and dry with no bruising or swelling noted at site.  Band aid applied.  Vss with discharge and left ambulatory with no s/s of distress noted.  

## 2018-09-25 NOTE — Progress Notes (Signed)
[place], [place] [address]   CLINIC:  Medical Oncology/Hematology  PCP:  [name], MD [place] [address] [phone]   REASON FOR VISIT: Follow-up for left breast cancer, ER+, PR+, HER-2+  CURRENT THERAPY: Femara   INTERVAL HISTORY:  [name] 58 y.o. female returns for routine follow-up for left breast cancer.  Patient reports she has been taking her Femara 2.5 mg daily with no complaints at this time.  She reports she still has her chronic back and hip pain otherwise she feels great. Denies any nausea, vomiting, or diarrhea. Denies any new pains. Had not noticed any recent bleeding such as epistaxis, hematuria or hematochezia. Denies recent chest pain on exertion, shortness of breath on minimal exertion, pre-syncopal episodes, or palpitations. Denies any numbness or tingling in hands or feet. Denies any recent fevers, infections, or recent hospitalizations. Patient reports appetite at 75% and energy level at 75%.  She is eating well and maintain her weight at this time.     REVIEW OF SYSTEMS:  Review of Systems  Musculoskeletal: Positive for back pain.  All other systems reviewed and are negative.    PAST MEDICAL/SURGICAL HISTORY:  Past Medical History:  Diagnosis Date  . Asthma   . Back pain   . Breast cancer ([facility]) 2008   left  . Diabetes ([place])   . Hypercholesterolemia   . Hypertension   . 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 Personal history of chemotherapy   . Personal history of radiation therapy 2008   Past Surgical History:  Procedure Laterality Date  . BACK SURGERY    . BREAST LUMPECTOMY    . COLONOSCOPY     about 3 yrs ago in [place]  . KNEE SURGERY     right  . TONSILLECTOMY       SOCIAL HISTORY:  Social History   Socioeconomic History  . Marital status: Married    Spouse name: Not on file  . Number of children: Not on file  . Years of education: Not on  file  . Highest education level: Not on file  Occupational History  . Occupation: Disabled  Social Needs  . Financial resource strain: Not on file  . Food insecurity    Worry: Not on file    Inability: Not on file  . Transportation needs    Medical: Not on file    Non-medical: Not on file  Tobacco Use  . Smoking status: Never Smoker  . Smokeless tobacco: Never Used  Substance and Sexual Activity  . Alcohol use: No    Alcohol/week: 0.0 standard drinks  . Drug use: No  . Sexual activity: Yes  Lifestyle  . Physical activity    Days per week: Not on file    Minutes per session: Not on file  . Stress: Not on file  Relationships  . Social Herbalist on phone: Not on file    Gets together: Not on file    Attends religious service: Not on file    Active member of club or organization: Not on file    Attends meetings of clubs or organizations: Not on file    Relationship status: Not on file  . Intimate partner violence    Fear of current or ex partner: Not on file    Emotionally abused: Not on file    Physically abused: Not on file    Forced sexual  activity: Not on file  Other Topics Concern  . Not on file  Social History Narrative  . Not on file    FAMILY HISTORY:  Family History  Problem Relation Age of Onset  . Colon cancer Neg Hx     CURRENT MEDICATIONS:  Outpatient Encounter Medications as of 09/25/2018  Medication Sig Note  . albuterol (PROVENTIL HFA;VENTOLIN HFA) 108 (90 Base) MCG/ACT inhaler Inhale 1 puff into the lungs every 6 (six) hours as needed for wheezing or shortness of breath.   Marland Kitchen amitriptyline (ELAVIL) 10 MG tablet TAKE 1 TO 2 TABLETS BY MOUTH EVERY DAY AT BEDTIME   . atorvastatin (LIPITOR) 20 MG tablet Take 20 mg by mouth daily. 01/28/2016: taking  . budesonide-formoterol (SYMBICORT) 160-4.5 MCG/ACT inhaler Inhale 2 puffs into the lungs 2 (two) times daily.   . calcium carbonate (OS-CAL - DOSED IN MG OF ELEMENTAL CALCIUM) 1250 (500 Ca) MG  tablet Take 1 tablet by mouth daily with breakfast.   . ergocalciferol (VITAMIN D2) 50000 units capsule Take 50,000 Units by mouth once a week.   . fentaNYL (DURAGESIC) 12 MCG/HR Place 1 patch onto the skin every 3 (three) days.   . insulin glargine (LANTUS) 100 UNIT/ML injection Inject 24 Units into the skin at bedtime.   Marland Kitchen letrozole (FEMARA) 2.5 MG tablet Take 2.5 mg by mouth daily.   Marland Kitchen loratadine (CLARITIN) 10 MG tablet Take 10 mg by mouth daily as needed for allergies.   Marland Kitchen losartan (COZAAR) 100 MG tablet Take 100 mg daily by mouth.   . methocarbamol (ROBAXIN) 500 MG tablet TAKE 1 TABLET BY MOUTH EVERY 6 HOURS AS NEEDED FOR MUSCLE SPASMS.   . Multiple Vitamins-Minerals (ICAPS AREDS 2) CAPS Take by mouth.   . naloxegol oxalate (MOVANTIK) 25 MG TABS tablet Take 1 tablet (25 mg total) by mouth daily.   Marland Kitchen oxyCODONE-acetaminophen (PERCOCET) 10-325 MG tablet Take 1 tablet by mouth every 8 (eight) hours as needed for pain.   . pregabalin (LYRICA) 100 MG capsule Take 1 capsule (100 mg total) by mouth 2 (two) times daily.   . sitaGLIPtin (JANUVIA) 100 MG tablet Take 100 mg by mouth daily.   . valsartan (DIOVAN) 160 MG tablet Take 160 mg by mouth daily.    No facility-administered encounter medications on file as of 09/25/2018.     ALLERGIES:  Allergies  Allergen Reactions  . Other     Cat/dog dander  . Pollen Extract      PHYSICAL EXAM:  ECOG Performance status: 1  Vitals:   09/25/18 1309  BP: 134/83  Pulse: 81  Resp: 18  Temp: 97.7 F (36.5 C)  SpO2: 100%   Filed Weights   09/25/18 1309  Weight: 139 lb (63 kg)    Physical Exam Constitutional:      Appearance: Normal appearance. She is normal weight.  Cardiovascular:     Rate and Rhythm: Normal rate and regular rhythm.     Heart sounds: Normal heart sounds.  Pulmonary:     Effort: Pulmonary effort is normal.     Breath sounds: Normal breath sounds.  Abdominal:     General: Bowel sounds are normal.     Palpations:  Abdomen is soft.  Musculoskeletal: Normal range of motion.  Skin:    General: Skin is warm and dry.  Neurological:     Mental Status: She is alert and oriented to person, place, and time. Mental status is at baseline.  Psychiatric:  Mood and Affect: Mood normal.        Behavior: Behavior normal.        Thought Content: Thought content normal.        Judgment: Judgment normal.   Breast: No palpable masses, no skin changes or nipple discharge, no adenopathy. Left breast incision site well-healed no palpable masses.  She does have scar tissue with thickened skin.  LABORATORY DATA:  I have reviewed the labs as listed.  CBC    Component Value Date/Time   WBC 6.1 09/19/2018 1038   RBC 4.59 09/19/2018 1038   HGB 11.9 (L) 09/19/2018 1038   HCT 39.1 09/19/2018 1038   PLT 249 09/19/2018 1038   MCV 85.2 09/19/2018 1038   MCH 25.9 (L) 09/19/2018 1038   MCHC 30.4 09/19/2018 1038   RDW 13.6 09/19/2018 1038   LYMPHSABS 2.1 09/19/2018 1038   MONOABS 0.5 09/19/2018 1038   EOSABS 0.2 09/19/2018 1038   BASOSABS 0.1 09/19/2018 1038   CMP Latest Ref Rng & Units 09/19/2018 03/26/2018 09/20/2017  Glucose 70 - 99 mg/dL 105(H) 89 90  BUN 6 - 20 mg/dL '10 16 12  '$ Creatinine 0.44 - 1.00 mg/dL 0.80 0.69 0.74  Sodium 135 - 145 mmol/L 140 137 139  Potassium 3.5 - 5.1 mmol/L 3.9 3.8 3.7  Chloride 98 - 111 mmol/L 103 101 103  CO2 22 - 32 mmol/L '29 28 28  '$ Calcium 8.9 - 10.3 mg/dL 9.9 9.7 9.7  Total Protein 6.5 - 8.1 g/dL 7.7 7.5 7.8  Total Bilirubin 0.3 - 1.2 mg/dL 0.6 0.3 0.5  Alkaline Phos 38 - 126 U/L 49 53 53  AST 15 - 41 U/L '17 20 17  '$ ALT 0 - 44 U/L '13 16 12    '$ I personally performed a face-to-face visit.  All questions were answered to patient's stated satisfaction. Encouraged patient to call with any new concerns or questions before his next visit to the cancer center and we can certain see him sooner, if needed.     ASSESSMENT & PLAN:   Breast cancer of upper-outer quadrant of left  female breast ([facility]) 1.  Left breast cancer: - She was diagnosed with breast cancer originally in 2008.  ER+, PR+, HER-2+ -She reportedly completed 1 year of Herceptin. - She took tamoxifen for 5 years.  She was previously followed by Dr. Talbert Cage. - She completed 5 years of Femara in 2018.  She is planning to stay on Femara for additional 5 years which will be complete 2023. -Last mammogram done on 03/14/2018 was BI-RADS Category 1 negative. -Labs done on 09/19/2018 showed potassium 3.9, creatinine 0.80, LDH 152, WBC 6.1, hemoglobin 11.9, platelets 249.  M spike not observed. -Patient reports she does have mild lymphedema in her left arm.  She uses her machine twice daily and wears a sleeve both which help. - Physical assessment today did not reveal any palpable masses or adenopathy. -We will see her back in 6 months with repeat mammogram and labs.  2.  Back pain: -Patient has chronic back pain. -She had an MRI of the spine done on 10/17/2014 that showed spinal stenosis. -She had a bone scan done on 05/10/2017 showed question of compression fractures. - She underwent a thoracic x-ray as well as sternal x-rays on 09/20/2017 that showed DJD and osteopenia. -She is being followed by pain management and may need an orthopedic evaluation if symptoms ongoing. - Patient was previously followed by Dr. Walden Field.  She did an SPEP which was negative.  3.  Osteopenia: -Her bone density test done on 03/08/2017 showed osteopenia. -She is being treated with Prolia every 6 months. - She will continue taking calcium and vitamin D daily. - We will repeat a bone density test in 03/2019       Orders placed this encounter:  Orders Placed This Encounter  Procedures  . MM Digital Screening  . DG Bone Density  . CBC with Differential/Platelet  . Comprehensive metabolic panel  . Lactate dehydrogenase  . Vitamin B12  . VITAMIN D 25 Hydroxy (Vit-D Deficiency, Fractures)      Francene Finders, FNP-C [facility] [phone]

## 2018-10-03 ENCOUNTER — Ambulatory Visit: Payer: BC Managed Care – PPO | Admitting: Registered Nurse

## 2018-10-09 ENCOUNTER — Encounter: Payer: BC Managed Care – PPO | Attending: Physical Medicine & Rehabilitation | Admitting: Registered Nurse

## 2018-10-09 ENCOUNTER — Encounter: Payer: Self-pay | Admitting: Registered Nurse

## 2018-10-09 ENCOUNTER — Other Ambulatory Visit: Payer: Self-pay

## 2018-10-09 VITALS — BP 147/88 | HR 84 | Temp 98.5°F | Ht 64.0 in | Wt 140.2 lb

## 2018-10-09 DIAGNOSIS — G620 Drug-induced polyneuropathy: Secondary | ICD-10-CM | POA: Diagnosis not present

## 2018-10-09 DIAGNOSIS — G894 Chronic pain syndrome: Secondary | ICD-10-CM | POA: Diagnosis not present

## 2018-10-09 DIAGNOSIS — Z79899 Other long term (current) drug therapy: Secondary | ICD-10-CM | POA: Diagnosis not present

## 2018-10-09 DIAGNOSIS — Z853 Personal history of malignant neoplasm of breast: Secondary | ICD-10-CM | POA: Insufficient documentation

## 2018-10-09 DIAGNOSIS — Z5181 Encounter for therapeutic drug level monitoring: Secondary | ICD-10-CM

## 2018-10-09 DIAGNOSIS — Z9221 Personal history of antineoplastic chemotherapy: Secondary | ICD-10-CM | POA: Insufficient documentation

## 2018-10-09 DIAGNOSIS — M47816 Spondylosis without myelopathy or radiculopathy, lumbar region: Secondary | ICD-10-CM | POA: Diagnosis not present

## 2018-10-09 DIAGNOSIS — M5416 Radiculopathy, lumbar region: Secondary | ICD-10-CM

## 2018-10-09 DIAGNOSIS — T451X5A Adverse effect of antineoplastic and immunosuppressive drugs, initial encounter: Secondary | ICD-10-CM

## 2018-10-09 DIAGNOSIS — I890 Lymphedema, not elsewhere classified: Secondary | ICD-10-CM | POA: Insufficient documentation

## 2018-10-09 DIAGNOSIS — M546 Pain in thoracic spine: Secondary | ICD-10-CM

## 2018-10-09 DIAGNOSIS — M62838 Other muscle spasm: Secondary | ICD-10-CM

## 2018-10-09 DIAGNOSIS — M545 Low back pain: Secondary | ICD-10-CM | POA: Insufficient documentation

## 2018-10-09 DIAGNOSIS — G8929 Other chronic pain: Secondary | ICD-10-CM | POA: Insufficient documentation

## 2018-10-09 MED ORDER — OXYCODONE-ACETAMINOPHEN 10-325 MG PO TABS: 1.0000 | ORAL_TABLET | Freq: Three times a day (TID) | ORAL | 0 refills | Status: DC | PRN

## 2018-10-09 MED ORDER — PREGABALIN 100 MG PO CAPS: 100.0000 mg | ORAL_CAPSULE | Freq: Two times a day (BID) | ORAL | 3 refills | Status: DC

## 2018-10-09 MED ORDER — FENTANYL 12 MCG/HR TD PT72: 1.0000 | MEDICATED_PATCH | TRANSDERMAL | 0 refills | Status: DC

## 2018-10-09 NOTE — Progress Notes (Signed)
Subjective:    Patient ID: Krista Doyle, female    DOB: 19-Apr-1960, 58 y.o.   MRN: [id number]  HPI: Krista Doyle is a 58 y.o. female who returns for follow up appointment for chronic pain and medication refill. Krista Doyle states her pain is located in her mid- lower back pain radiating into her right lower extremity. Krista Doyle rates her  Pain 6. Her current exercise regime is walking.   Krista Doyle Morphine equivalent is 73.80 MME.  Last Oral Swab was Performed on 03/07/2018, it was consistent. Oral Swab was Performed today.   Krista Doyle states her grandmother passed away, emotional support given.   Pain Inventory Average Pain 6-7 Pain Right Now 6 My pain is n/a  In the last 24 hours, has pain interfered with the following? General activity 7 Relation with others 7 Enjoyment of life 7 What TIME of day is your pain at its worst? evening and night Sleep (in general) Fair  Pain is worse with: walking, bending, sitting, standing and some activites Pain improves with: heat/ice, medication and TENS Relief from Meds: 8  Mobility walk with assistance use a cane use a walker how many minutes can you walk? 5-10 ability to climb steps?  yes do you drive?  yes Do you have any goals in this area?  no  Function disabled: date disabled . I need assistance with the following:  shopping  Neuro/Psych numbness tingling spasms  Prior Studies Any changes since last visit?  no  Physicians involved in your care Any changes since last visit?  no   Family History  Problem Relation Age of Onset  . Colon cancer Neg Hx    Social History   Socioeconomic History  . Marital status: Married    Spouse name: Not on file  . Number of children: Not on file  . Years of education: Not on file  . Highest education level: Not on file  Occupational History  . Occupation: Disabled  Social Needs  . Financial resource strain: Not on file  . Food insecurity    Worry: Not on file   Inability: Not on file  . Transportation needs    Medical: Not on file    Non-medical: Not on file  Tobacco Use  . Smoking status: Never Smoker  . Smokeless tobacco: Never Used  Substance and Sexual Activity  . Alcohol use: No    Alcohol/week: 0.0 standard drinks  . Drug use: No  . Sexual activity: Yes  Lifestyle  . Physical activity    Days per week: Not on file    Minutes per session: Not on file  . Stress: Not on file  Relationships  . Social Herbalist on phone: Not on file    Gets together: Not on file    Attends religious service: Not on file    Active member of club or organization: Not on file    Attends meetings of clubs or organizations: Not on file    Relationship status: Not on file  Other Topics Concern  . Not on file  Social History Narrative  . Not on file   Past Surgical History:  Procedure Laterality Date  . BACK SURGERY    . BREAST LUMPECTOMY    . COLONOSCOPY     about 3 yrs ago in [place]  . KNEE SURGERY     right  . TONSILLECTOMY     Past Medical History:  Diagnosis Date  . Asthma   .  Back pain   . Breast cancer ([facility]) 2008   left  . Diabetes ([facility])   . Hypercholesterolemia   . Hypertension   . 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 Personal history of chemotherapy   . Personal history of radiation therapy 2008   BP (!) 147/88   Pulse 84   Temp 98.5 F (36.9 C)   Ht 5\' 4"  (1.626 m)   Wt 140 lb 3.2 oz (63.6 kg)   SpO2 98%   BMI 24.07 kg/m   Opioid Risk Score:   Fall Risk Score:  `1  Depression screen PHQ 2/9  Depression screen [facility] 2/9 12/11/2017 11/07/2017 04/21/2017 01/13/2017 05/27/2016 03/28/2016 10/07/2015  Decreased Interest 0 0 - 0 0 0 0  Down, Depressed, Hopeless 0 0 - 0 0 0 0  PHQ - 2 Score 0 0 - 0 0 0 0  Altered sleeping - - 0 - - - -  Tired, decreased energy - - 0 - - - -  Change in appetite - - - - - - -  Feeling bad or failure about yourself  - - - - - - -  Trouble concentrating - - - - - - -  Moving  slowly or fidgety/restless - - - - - - -  Suicidal thoughts - - - - - - -  PHQ-9 Score - - - - - - -  Difficult doing work/chores - - - - - - -    Review of Systems  Constitutional: Negative.   HENT: Negative.   Eyes: Negative.   Respiratory: Negative.   Cardiovascular: Negative.   Gastrointestinal: Negative.   Endocrine: Negative.   Genitourinary: Negative.   Musculoskeletal: Positive for back pain and joint swelling.  Skin: Negative.   Allergic/Immunologic: Negative.   Neurological: Positive for numbness.  Hematological: Negative.   Psychiatric/Behavioral: Negative.   All other systems reviewed and are negative.      Objective:   Physical Exam Vitals signs and nursing note reviewed.  Constitutional:      Appearance: Normal appearance.  Neck:     Musculoskeletal: Normal range of motion and neck supple.  Cardiovascular:     Rate and Rhythm: Normal rate and regular rhythm.     Pulses: Normal pulses.     Heart sounds: Normal heart sounds.  Pulmonary:     Effort: Pulmonary effort is normal.     Breath sounds: Normal breath sounds.  Genitourinary:    Comments: Normal Muscle Bulk and Muscle Testing Reveals:  Upper Extremities: Right: Full ROM and Muscle Strength 4/5 Left Decreased ROM 90 Degrees and Muscle Strength 3/5 Thoracic Paraspinal Tenderness: T-3-T-7 Lumbar Hypersensitivity Lower Extremities: Right: Decreased ROM and Muscle Strength 5/5 Right Lower Extremity Flexion Produces Pain into Right Patella Left: Full ROM and Muscle Strength 5/5 Arises from Table slowly using cane for support Antalgic Gait  Skin:    General: Skin is warm and dry.  Neurological:     Mental Status: Krista Doyle is alert and oriented to person, place, and time.  Psychiatric:        Mood and Affect: Mood normal.        Behavior: Behavior normal.           Assessment & Plan:  1. Chronic low back pain, Lumbar Facet Arthropathy,lumbar spondylosis, spondylolisthesis, with lumbar  radiculopathy.Continuecurrentmedication regimen:10/09/2018 Refilled: Fentanyl 12 mcg one patch every three days #10 and Oxycodone 10/325 mg one tablet every 8 hours as needed for pain #90.  We  will continue the opioid monitoring program, this consists of regular clinic visits, examinations, urine drug screen, pill counts as well as use of [place] Controlled Substance reporting System. 2. Chemotherapy induced peripheral neuropathy:ContinueLyrica. 10/09/2018 3. Chronic Midline Thoracic Pain: Continue HEP and Current Medication Regime.10/09/2018 4. Muscle Spasm: Continuemedication regimen withRobaxin09/09/2018 5. Right Knee Pain:Continue Current Medication Regime: Continue HEP as ToleratedandContinue to Monitor.10/09/2018 6. Right Greater Trochanteric Bursitis:No complaints Today. Continue to Alternate Ice and Heat Therapy.Continue to Monitor. 10/09/2018 7. Chronic Pain Syndrome: ContinueAmitriptyline, Fentanyly and Oxycodone. Continue to Monitor.10/09/2018  F/U in 1 month  32minutes of face to face patient care time was spent during this visit. All questions were encouraged and answered.

## 2018-10-12 LAB — DRUG TOX MONITOR 1 W/CONF, ORAL FLD
Amphetamines: NEGATIVE ng/mL (ref <10)
Barbiturates: NEGATIVE ng/mL (ref <10)
Benzodiazepines: NEGATIVE ng/mL (ref <0.50)
Buprenorphine: NEGATIVE ng/mL (ref <0.10)
Cocaine: NEGATIVE ng/mL (ref <5.0)
Codeine: NEGATIVE ng/mL (ref <2.5)
Dihydrocodeine: NEGATIVE ng/mL (ref <2.5)
Fentanyl: 0.57 ng/mL — ABNORMAL HIGH (ref <0.10)
Fentanyl: POSITIVE ng/mL — AB (ref <0.10)
Heroin Metabolite: NEGATIVE ng/mL (ref <1.0)
Hydrocodone: NEGATIVE ng/mL (ref <2.5)
Hydromorphone: NEGATIVE ng/mL (ref <2.5)
MARIJUANA: NEGATIVE ng/mL (ref <2.5)
MDMA: NEGATIVE ng/mL (ref <10)
Meprobamate: NEGATIVE ng/mL (ref <2.5)
Methadone: NEGATIVE ng/mL (ref <5.0)
Morphine: NEGATIVE ng/mL (ref <2.5)
Nicotine Metabolite: NEGATIVE ng/mL (ref <5.0)
Norhydrocodone: NEGATIVE ng/mL (ref <2.5)
Noroxycodone: 3 ng/mL — ABNORMAL HIGH (ref <2.5)
Opiates: POSITIVE ng/mL — AB (ref <2.5)
Oxycodone: 26.7 ng/mL — ABNORMAL HIGH (ref <2.5)
Oxymorphone: NEGATIVE ng/mL (ref <2.5)
Phencyclidine: NEGATIVE ng/mL (ref <10)
Tapentadol: NEGATIVE ng/mL (ref <5.0)
Tramadol: NEGATIVE ng/mL (ref <5.0)
Zolpidem: NEGATIVE ng/mL (ref <5.0)

## 2018-10-12 LAB — DRUG TOX ALC METAB W/CON, ORAL FLD: Alcohol Metabolite: NEGATIVE ng/mL (ref <25)

## 2018-10-17 ENCOUNTER — Telehealth: Payer: Self-pay | Admitting: *Deleted

## 2018-10-17 NOTE — Telephone Encounter (Signed)
Oral swab drug screen was consistent for prescribed medications.  ?

## 2018-11-09 ENCOUNTER — Other Ambulatory Visit: Payer: Self-pay

## 2018-11-09 ENCOUNTER — Encounter: Payer: Self-pay | Admitting: Registered Nurse

## 2018-11-09 ENCOUNTER — Encounter: Payer: BC Managed Care – PPO | Attending: Physical Medicine & Rehabilitation | Admitting: Registered Nurse

## 2018-11-09 VITALS — BP 135/86 | HR 89 | Temp 97.5°F | Ht 64.0 in | Wt 139.0 lb

## 2018-11-09 DIAGNOSIS — G620 Drug-induced polyneuropathy: Secondary | ICD-10-CM | POA: Diagnosis not present

## 2018-11-09 DIAGNOSIS — M546 Pain in thoracic spine: Secondary | ICD-10-CM

## 2018-11-09 DIAGNOSIS — Z79899 Other long term (current) drug therapy: Secondary | ICD-10-CM

## 2018-11-09 DIAGNOSIS — T451X5A Adverse effect of antineoplastic and immunosuppressive drugs, initial encounter: Secondary | ICD-10-CM | POA: Diagnosis not present

## 2018-11-09 DIAGNOSIS — M7061 Trochanteric bursitis, right hip: Secondary | ICD-10-CM | POA: Diagnosis not present

## 2018-11-09 DIAGNOSIS — M5416 Radiculopathy, lumbar region: Secondary | ICD-10-CM

## 2018-11-09 DIAGNOSIS — I890 Lymphedema, not elsewhere classified: Secondary | ICD-10-CM | POA: Insufficient documentation

## 2018-11-09 DIAGNOSIS — G894 Chronic pain syndrome: Secondary | ICD-10-CM

## 2018-11-09 DIAGNOSIS — M62838 Other muscle spasm: Secondary | ICD-10-CM

## 2018-11-09 DIAGNOSIS — Z9221 Personal history of antineoplastic chemotherapy: Secondary | ICD-10-CM | POA: Insufficient documentation

## 2018-11-09 DIAGNOSIS — G8929 Other chronic pain: Secondary | ICD-10-CM

## 2018-11-09 DIAGNOSIS — M47816 Spondylosis without myelopathy or radiculopathy, lumbar region: Secondary | ICD-10-CM

## 2018-11-09 DIAGNOSIS — M545 Low back pain: Secondary | ICD-10-CM | POA: Insufficient documentation

## 2018-11-09 DIAGNOSIS — Z853 Personal history of malignant neoplasm of breast: Secondary | ICD-10-CM | POA: Diagnosis not present

## 2018-11-09 MED ORDER — FENTANYL 12 MCG/HR TD PT72: 1.0000 | MEDICATED_PATCH | TRANSDERMAL | 0 refills | Status: DC

## 2018-11-09 MED ORDER — OXYCODONE-ACETAMINOPHEN 10-325 MG PO TABS: 1.0000 | ORAL_TABLET | Freq: Three times a day (TID) | ORAL | 0 refills | Status: DC | PRN

## 2018-11-09 NOTE — Progress Notes (Signed)
Subjective:    Patient ID: Krista Doyle, female    DOB: [birth year], 58 y.o.   MRN: [id number]  HPI: Krista Doyle is a 58 y.o. female who returns for follow up appointment for chronic pain and medication refill. She states her pain is located in her mid- lower back pain radiating into her right hip and right lower extremity. She  rates her pain 6. Her current exercise regime is walking and performing stretching exercises.  Krista Doyle Morphine equivalent is  73.80 MME.  Last Oral Swab was Performed on 10/09/2018, it was consistent.    Pain Inventory Average Pain 6 Pain Right Now 6 My pain is sharp, stabbing and tingling  In the last 24 hours, has pain interfered with the following? General activity 6 Relation with others 6 Enjoyment of life 6 What TIME of day is your pain at its worst? evening and night Sleep (in general) Fair  Pain is worse with: walking, bending, sitting and standing Pain improves with: rest, heat/ice, medication and TENS Relief from Meds: 6  Mobility walk with assistance use a cane use a walker how many minutes can you walk? 10 ability to climb steps?  yes do you drive?  yes  Function disabled: date disabled 2008  Neuro/Psych weakness numbness tingling  Prior Studies Any changes since last visit?  no  Physicians involved in your care Primary care .   Family History  Problem Relation Age of Onset  . Colon cancer Neg Hx    Social History   Socioeconomic History  . Marital status: Married    Spouse name: Not on file  . Number of children: Not on file  . Years of education: Not on file  . Highest education level: Not on file  Occupational History  . Occupation: Disabled  Social Needs  . Financial resource strain: Not on file  . Food insecurity    Worry: Not on file    Inability: Not on file  . Transportation needs    Medical: Not on file    Non-medical: Not on file  Tobacco Use  . Smoking status: Never  Smoker  . Smokeless tobacco: Never Used  Substance and Sexual Activity  . Alcohol use: No    Alcohol/week: 0.0 standard drinks  . Drug use: No  . Sexual activity: Yes  Lifestyle  . Physical activity    Days per week: Not on file    Minutes per session: Not on file  . Stress: Not on file  Relationships  . Social Herbalist on phone: Not on file    Gets together: Not on file    Attends religious service: Not on file    Active member of club or organization: Not on file    Attends meetings of clubs or organizations: Not on file    Relationship status: Not on file  Other Topics Concern  . Not on file  Social History Narrative  . Not on file   Past Surgical History:  Procedure Laterality Date  . BACK SURGERY    . BREAST LUMPECTOMY    . COLONOSCOPY     about 3 yrs ago in [place]  . KNEE SURGERY     right  . TONSILLECTOMY     Past Medical History:  Diagnosis Date  . Asthma   . Back pain   . Breast cancer ([place]) 2008   left  . Diabetes ([place])   . Hypercholesterolemia   . Hypertension   .  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 Personal history of chemotherapy   . Personal history of radiation therapy 2008   BP 135/86   Pulse 89   Temp (!) 97.5 F (36.4 C)   Ht 5\' 4"  (1.626 m)   Wt 139 lb (63 kg)   SpO2 99%   BMI 23.86 kg/m   Opioid Risk Score:   Fall Risk Score:  `1  Depression screen PHQ 2/9  Depression screen [facility] 2/9 12/11/2017 11/07/2017 04/21/2017 01/13/2017 05/27/2016 03/28/2016 10/07/2015  Decreased Interest 0 0 - 0 0 0 0  Down, Depressed, Hopeless 0 0 - 0 0 0 0  PHQ - 2 Score 0 0 - 0 0 0 0  Altered sleeping - - 0 - - - -  Tired, decreased energy - - 0 - - - -  Change in appetite - - - - - - -  Feeling bad or failure about yourself  - - - - - - -  Trouble concentrating - - - - - - -  Moving slowly or fidgety/restless - - - - - - -  Suicidal thoughts - - - - - - -  PHQ-9 Score - - - - - - -  Difficult doing work/chores - - - - - - -      Review of Systems  Constitutional: Positive for diaphoresis.  Neurological: Positive for weakness and numbness.  All other systems reviewed and are negative.      Objective:   Physical Exam Vitals signs and nursing note reviewed.  Constitutional:      Appearance: Normal appearance.  Neck:     Musculoskeletal: Normal range of motion and neck supple.  Cardiovascular:     Rate and Rhythm: Normal rate and regular rhythm.     Pulses: Normal pulses.     Heart sounds: Normal heart sounds.  Pulmonary:     Effort: Pulmonary effort is normal.     Breath sounds: Normal breath sounds.  Musculoskeletal:     Comments: Normal Muscle Bulk and Muscle Testing Reveals:  Upper Extremities:Right: Decreased  ROM 90 Degrees  and Muscle Strength 4/5  Left: Decreased ROM 45 Degrees and Muscle Strength 3/5  Thoracic Hypersensitivity : T-7-T-9 Lumbar Hypersensitivity Right Greater Trochanter Tenderness Lower Extremities: Full ROM and Muscle Strength 5/5 Right Lower Extremity Flexion Produces Pain into Right Lower Extremity Arises from table slowly using cane for support Antalgic Gait   Skin:    General: Skin is warm and dry.  Neurological:     Mental Status: She is alert and oriented to person, place, and time.  Psychiatric:        Mood and Affect: Mood normal.        Behavior: Behavior normal.           Assessment & Plan:  1. Chronic low back pain, Lumbar Facet Arthropathy,lumbar spondylosis, spondylolisthesis, with lumbar radiculopathy.Continuecurrentmedication regimen:11/09/2018 Refilled: Fentanyl 12 mcg one patch every three days #10 and Oxycodone 10/325 mg one tablet every 8 hours as needed for pain #90.  We will continue the opioid monitoring program, this consists of regular clinic visits, examinations, urine drug screen, pill counts as well as use of [place] Controlled Substance reporting System. 2. Chemotherapy induced peripheral neuropathy:ContinueLyrica. 11/09/2018  3. Chronic Midline Thoracic Pain: Continue HEP and Current Medication Regime.11/09/2018 4. Muscle Spasm: Continuemedication regimen withRobaxin10/10/2018 5. Right Knee Pain:No complaints today. Continue Current Medication Regime: Continue HEP as ToleratedandContinue to Monitor.11/09/2018 6. Right Greater Trochanteric Bursitis:No complaints  Today. Continue to Alternate Ice and Heat Therapy.Continue to Monitor. 010/10/2018 7. Chronic Pain Syndrome: ContinueAmitriptyline, Fentanyly and Oxycodone. Continue to Monitor.11/09/2018  F/U in 1 month  55minutes of face to face patient care time was spent during this visit. All questions were encouraged and answered.

## 2018-11-27 ENCOUNTER — Other Ambulatory Visit: Payer: Self-pay | Admitting: Registered Nurse

## 2018-12-07 ENCOUNTER — Encounter: Payer: Self-pay | Admitting: Registered Nurse

## 2018-12-07 ENCOUNTER — Other Ambulatory Visit: Payer: Self-pay

## 2018-12-07 ENCOUNTER — Encounter: Payer: BC Managed Care – PPO | Attending: Physical Medicine & Rehabilitation | Admitting: Registered Nurse

## 2018-12-07 VITALS — BP 113/81 | HR 81 | Temp 97.7°F | Ht 64.0 in | Wt 139.0 lb

## 2018-12-07 DIAGNOSIS — M62838 Other muscle spasm: Secondary | ICD-10-CM | POA: Diagnosis not present

## 2018-12-07 DIAGNOSIS — I890 Lymphedema, not elsewhere classified: Secondary | ICD-10-CM | POA: Insufficient documentation

## 2018-12-07 DIAGNOSIS — G894 Chronic pain syndrome: Secondary | ICD-10-CM

## 2018-12-07 DIAGNOSIS — M47816 Spondylosis without myelopathy or radiculopathy, lumbar region: Secondary | ICD-10-CM

## 2018-12-07 DIAGNOSIS — M7061 Trochanteric bursitis, right hip: Secondary | ICD-10-CM

## 2018-12-07 DIAGNOSIS — M546 Pain in thoracic spine: Secondary | ICD-10-CM | POA: Diagnosis not present

## 2018-12-07 DIAGNOSIS — M545 Low back pain: Secondary | ICD-10-CM | POA: Insufficient documentation

## 2018-12-07 DIAGNOSIS — T451X5A Adverse effect of antineoplastic and immunosuppressive drugs, initial encounter: Secondary | ICD-10-CM | POA: Diagnosis not present

## 2018-12-07 DIAGNOSIS — G620 Drug-induced polyneuropathy: Secondary | ICD-10-CM | POA: Diagnosis not present

## 2018-12-07 DIAGNOSIS — G8929 Other chronic pain: Secondary | ICD-10-CM

## 2018-12-07 DIAGNOSIS — M5416 Radiculopathy, lumbar region: Secondary | ICD-10-CM

## 2018-12-07 DIAGNOSIS — Z853 Personal history of malignant neoplasm of breast: Secondary | ICD-10-CM | POA: Insufficient documentation

## 2018-12-07 DIAGNOSIS — Z79891 Long term (current) use of opiate analgesic: Secondary | ICD-10-CM

## 2018-12-07 DIAGNOSIS — Z9221 Personal history of antineoplastic chemotherapy: Secondary | ICD-10-CM | POA: Insufficient documentation

## 2018-12-07 DIAGNOSIS — Z5181 Encounter for therapeutic drug level monitoring: Secondary | ICD-10-CM

## 2018-12-07 MED ORDER — FENTANYL 12 MCG/HR TD PT72: 1.0000 | MEDICATED_PATCH | TRANSDERMAL | 0 refills | Status: DC

## 2018-12-07 MED ORDER — OXYCODONE-ACETAMINOPHEN 10-325 MG PO TABS: 1.0000 | ORAL_TABLET | Freq: Three times a day (TID) | ORAL | 0 refills | Status: DC | PRN

## 2018-12-07 NOTE — Progress Notes (Signed)
Subjective:    Patient ID: Krista Doyle, female    DOB: July 31, 1960, 58 y.o.   MRN: [id number]  HPI: Krista Doyle is a 58 y.o. female who returns for follow up appointment for chronic pain and medication refill. She states her pain is located in  Her mid- lower back radiating into her right hip and right lower extremity. She rates her pain 6. Her current exercise regime is walking and performing stretching exercises.  [name] reports a week ago she was walking in her home when her right lower extremity gave out and  she had a near miss fall, she was able to brace herself from falling by using her cane. She denies falling on floor, educated on falls prevention, she verbalizes understanding.   [name] Morphine equivalent is 73.80  MME. Last Oral Swab was Performed on 10/09/2018, it was consistent.    Pain Inventory Average Pain 9 Pain Right Now 6 My pain is sharp, stabbing and tingling  In the last 24 hours, has pain interfered with the following? General activity 7 Relation with others 6 Enjoyment of life 6 What TIME of day is your pain at its worst? evening and night Sleep (in general) Fair  Pain is worse with: walking, bending, sitting and standing Pain improves with: rest, heat/ice, pacing activities, medication and TENS Relief from Meds: 8  Mobility walk with assistance use a cane use a walker how many minutes can you walk? 10 ability to climb steps?  yes do you drive?  yes  Function disabled: date disabled . I need assistance with the following:  household duties and shopping  Neuro/Psych weakness numbness tingling trouble walking  Prior Studies Any changes since last visit?  no  Physicians involved in your care Any changes since last visit?  no   Family History  Problem Relation Age of Onset  . Colon cancer Neg Hx    Social History   Socioeconomic History  . Marital status: Married    Spouse name: Not on file  .  Number of children: Not on file  . Years of education: Not on file  . Highest education level: Not on file  Occupational History  . Occupation: Disabled  Social Needs  . Financial resource strain: Not on file  . Food insecurity    Worry: Not on file    Inability: Not on file  . Transportation needs    Medical: Not on file    Non-medical: Not on file  Tobacco Use  . Smoking status: Never Smoker  . Smokeless tobacco: Never Used  Substance and Sexual Activity  . Alcohol use: No    Alcohol/week: 0.0 standard drinks  . Drug use: No  . Sexual activity: Yes  Lifestyle  . Physical activity    Days per week: Not on file    Minutes per session: Not on file  . Stress: Not on file  Relationships  . Social Herbalist on phone: Not on file    Gets together: Not on file    Attends religious service: Not on file    Active member of club or organization: Not on file    Attends meetings of clubs or organizations: Not on file    Relationship status: Not on file  Other Topics Concern  . Not on file  Social History Narrative  . Not on file   Past Surgical History:  Procedure Laterality Date  . BACK SURGERY    . BREAST  LUMPECTOMY    . COLONOSCOPY     about 3 yrs ago in [place]  . KNEE SURGERY     right  . TONSILLECTOMY     Past Medical History:  Diagnosis Date  . Asthma   . Back pain   . Breast cancer ([place]) 2008   left  . Diabetes ([place])   . Hypercholesterolemia   . Hypertension   . 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 Personal history of chemotherapy   . Personal history of radiation therapy 2008   BP 113/81   Pulse 81   Temp 97.7 F (36.5 C)   Ht 5\' 4"  (1.626 m)   Wt 139 lb (63 kg)   BMI 23.86 kg/m   Opioid Risk Score:   Fall Risk Score:  `1  Depression screen PHQ 2/9  Depression screen [facility] 2/9 12/11/2017 11/07/2017 04/21/2017 01/13/2017 05/27/2016 03/28/2016 10/07/2015  Decreased Interest 0 0 - 0 0 0 0  Down, Depressed, Hopeless 0 0 - 0 0 0 0   PHQ - 2 Score 0 0 - 0 0 0 0  Altered sleeping - - 0 - - - -  Tired, decreased energy - - 0 - - - -  Change in appetite - - - - - - -  Feeling bad or failure about yourself  - - - - - - -  Trouble concentrating - - - - - - -  Moving slowly or fidgety/restless - - - - - - -  Suicidal thoughts - - - - - - -  PHQ-9 Score - - - - - - -  Difficult doing work/chores - - - - - - -   Review of Systems  Constitutional: Positive for unexpected weight change.  Musculoskeletal: Positive for gait problem.  Neurological: Positive for weakness and numbness.       Tingling  All other systems reviewed and are negative.      Objective:   Physical Exam Vitals signs and nursing note reviewed.  Constitutional:      Appearance: Normal appearance.  Neck:     Musculoskeletal: Normal range of motion and neck supple.  Cardiovascular:     Rate and Rhythm: Normal rate and regular rhythm.     Pulses: Normal pulses.     Heart sounds: Normal heart sounds.  Pulmonary:     Effort: Pulmonary effort is normal.     Breath sounds: Normal breath sounds.  Musculoskeletal:     Comments: Normal Muscle Bulk and Muscle Testing Reveals:  Upper Extremities: Right: Full ROM and Muscle Strength 5/5 Left: Decreased ROM 30 Degrees and Muscle Strength 3/5 Lumbar Hypersensitivity Right Greater Trochanter Tenderness Lower Extremities: Decreased ROM and Muscle Strength 4/5 Right Lower Extremity Flexion Produces Pain into her Lumbar and Right Hip Arises from chair slowly using cane for support Antalgic Gait   Skin:    General: Skin is warm and dry.  Neurological:     Mental Status: She is alert and oriented to person, place, and time.  Psychiatric:        Mood and Affect: Mood normal.        Behavior: Behavior normal.           Assessment & Plan:  1. Chronic low back pain, Lumbar Facet Arthropathy,lumbar spondylosis, spondylolisthesis, with lumbar radiculopathy.Continuecurrentmedication regimen:12/07/2018  Refilled: Fentanyl 12 mcg one patch every three days #10 and Oxycodone 10/325 mg one tablet every 8 hours as needed for pain #90.  We will  continue the opioid monitoring program, this consists of regular clinic visits, examinations, urine drug screen, pill counts as well as use of [place] Controlled Substance reporting System. 2. Chemotherapy induced peripheral neuropathy:ContinueLyrica. 12/07/2018 3. Chronic Midline Thoracic Pain: Continue HEP and Current Medication Regime.12/07/2018 4. Muscle Spasm: Continuemedication regimen withRobaxin11/07/2018 5. Right Knee Pain:No complaints today. Continue Current Medication Regime: Continue HEP as ToleratedandContinue to Monitor.12/07/2018 6. Right Greater Trochanteric Bursitis:No complaints Today.Continue to Alternate Ice and Heat Therapy.Continue to Monitor.12/07/2018 7. Chronic Pain Syndrome: ContinueAmitriptyline, Fentanyly and Oxycodone. Continue to Monitor.12/07/2018 8. Near Miss Fall: Educated on [employer]. She verbalizes understanding.    F/U in 1 month   21minutes of face to face patient care time was spent during this visit. All questions were encouraged and answered.

## 2018-12-12 LAB — DRUG TOX MONITOR 1 W/CONF, ORAL FLD
Amphetamines: NEGATIVE ng/mL (ref <10)
Barbiturates: NEGATIVE ng/mL (ref <10)
Benzodiazepines: NEGATIVE ng/mL (ref <0.50)
Buprenorphine: NEGATIVE ng/mL (ref <0.10)
Cocaine: NEGATIVE ng/mL (ref <5.0)
Codeine: NEGATIVE ng/mL (ref <2.5)
Dihydrocodeine: NEGATIVE ng/mL (ref <2.5)
Fentanyl: 9.33 ng/mL — ABNORMAL HIGH (ref <0.10)
Fentanyl: POSITIVE ng/mL — AB (ref <0.10)
Heroin Metabolite: NEGATIVE ng/mL (ref <1.0)
Hydrocodone: NEGATIVE ng/mL (ref <2.5)
Hydromorphone: NEGATIVE ng/mL (ref <2.5)
MARIJUANA: NEGATIVE ng/mL (ref <2.5)
MDMA: NEGATIVE ng/mL (ref <10)
Meprobamate: NEGATIVE ng/mL (ref <2.5)
Methadone: NEGATIVE ng/mL (ref <5.0)
Morphine: NEGATIVE ng/mL (ref <2.5)
Nicotine Metabolite: NEGATIVE ng/mL (ref <5.0)
Norhydrocodone: NEGATIVE ng/mL (ref <2.5)
Noroxycodone: 26.2 ng/mL — ABNORMAL HIGH (ref <2.5)
Opiates: POSITIVE ng/mL — AB (ref <2.5)
Oxycodone: 135.9 ng/mL — ABNORMAL HIGH (ref <2.5)
Oxymorphone: NEGATIVE ng/mL (ref <2.5)
Phencyclidine: NEGATIVE ng/mL (ref <10)
Tapentadol: NEGATIVE ng/mL (ref <5.0)
Tramadol: NEGATIVE ng/mL (ref <5.0)
Zolpidem: NEGATIVE ng/mL (ref <5.0)

## 2018-12-12 LAB — DRUG TOX ALC METAB W/CON, ORAL FLD: Alcohol Metabolite: NEGATIVE ng/mL (ref <25)

## 2018-12-13 ENCOUNTER — Telehealth: Payer: Self-pay | Admitting: *Deleted

## 2018-12-13 NOTE — Telephone Encounter (Signed)
Oral swab drug screen was consistent for prescribed medications.  ?

## 2019-01-01 ENCOUNTER — Other Ambulatory Visit: Payer: Self-pay | Admitting: *Deleted

## 2019-01-03 ENCOUNTER — Encounter: Payer: Self-pay | Admitting: Registered Nurse

## 2019-01-03 ENCOUNTER — Other Ambulatory Visit: Payer: Self-pay

## 2019-01-03 ENCOUNTER — Encounter: Payer: BC Managed Care – PPO | Attending: Physical Medicine & Rehabilitation | Admitting: Registered Nurse

## 2019-01-03 VITALS — BP 134/82 | HR 85 | Temp 98.4°F | Ht 64.0 in | Wt 141.0 lb

## 2019-01-03 DIAGNOSIS — G620 Drug-induced polyneuropathy: Secondary | ICD-10-CM

## 2019-01-03 DIAGNOSIS — Z853 Personal history of malignant neoplasm of breast: Secondary | ICD-10-CM | POA: Insufficient documentation

## 2019-01-03 DIAGNOSIS — G894 Chronic pain syndrome: Secondary | ICD-10-CM | POA: Diagnosis not present

## 2019-01-03 DIAGNOSIS — M62838 Other muscle spasm: Secondary | ICD-10-CM | POA: Diagnosis not present

## 2019-01-03 DIAGNOSIS — Z9221 Personal history of antineoplastic chemotherapy: Secondary | ICD-10-CM | POA: Diagnosis not present

## 2019-01-03 DIAGNOSIS — M7061 Trochanteric bursitis, right hip: Secondary | ICD-10-CM | POA: Diagnosis not present

## 2019-01-03 DIAGNOSIS — G8929 Other chronic pain: Secondary | ICD-10-CM | POA: Diagnosis not present

## 2019-01-03 DIAGNOSIS — I890 Lymphedema, not elsewhere classified: Secondary | ICD-10-CM | POA: Insufficient documentation

## 2019-01-03 DIAGNOSIS — M47816 Spondylosis without myelopathy or radiculopathy, lumbar region: Secondary | ICD-10-CM

## 2019-01-03 DIAGNOSIS — M546 Pain in thoracic spine: Secondary | ICD-10-CM | POA: Diagnosis not present

## 2019-01-03 DIAGNOSIS — Z5181 Encounter for therapeutic drug level monitoring: Secondary | ICD-10-CM

## 2019-01-03 DIAGNOSIS — M5416 Radiculopathy, lumbar region: Secondary | ICD-10-CM

## 2019-01-03 DIAGNOSIS — T451X5A Adverse effect of antineoplastic and immunosuppressive drugs, initial encounter: Secondary | ICD-10-CM | POA: Insufficient documentation

## 2019-01-03 DIAGNOSIS — Z79891 Long term (current) use of opiate analgesic: Secondary | ICD-10-CM | POA: Diagnosis not present

## 2019-01-03 DIAGNOSIS — M545 Low back pain: Secondary | ICD-10-CM | POA: Insufficient documentation

## 2019-01-03 MED ORDER — METHOCARBAMOL 500 MG PO TABS: ORAL_TABLET | ORAL | 2 refills | Status: DC

## 2019-01-03 MED ORDER — OXYCODONE-ACETAMINOPHEN 10-325 MG PO TABS: 1.0000 | ORAL_TABLET | Freq: Three times a day (TID) | ORAL | 0 refills | Status: DC | PRN

## 2019-01-03 MED ORDER — FENTANYL 12 MCG/HR TD PT72: 1.0000 | MEDICATED_PATCH | TRANSDERMAL | 0 refills | Status: DC

## 2019-01-03 NOTE — Progress Notes (Signed)
Subjective:    Patient ID: Krista Doyle, female    DOB: 12/12/1960, 59 y.o.   MRN: [id number]  HPI: Krista Doyle is a 58 y.o. female who returns for follow up appointment for chronic pain and medication refill. She states her pain is located in her mid- lower back radiating  into her right lower extremity. She rates her pain 6. Her current exercise regime is walking and performing stretching exercises.  Krista Doyle Morphine equivalent is 73.80 MME.  Last Oral Swab was Performed on 12/07/2018, it was consistent.    Pain Inventory Average Pain 7 Pain Right Now 6 My pain is na  In the last 24 hours, has pain interfered with the following? General activity 8 Relation with others 7 Enjoyment of life 7 What TIME of day is your pain at its worst? evening and night Sleep (in general) NA  Pain is worse with: walking, bending, sitting, standing and some activites Pain improves with: rest, heat/ice, medication and TENS Relief from Meds: na  Mobility use a cane use a walker ability to climb steps?  yes do you drive?  yes  Function disabled: date disabled . I need assistance with the following:  household duties and shopping  Neuro/Psych numbness tingling spasms  Prior Studies Any changes since last visit?  no  Physicians involved in your care Any changes since last visit?  no   Family History  Problem Relation Age of Onset  . Colon cancer Neg Hx    Social History   Socioeconomic History  . Marital status: Married    Spouse name: Not on file  . Number of children: Not on file  . Years of education: Not on file  . Highest education level: Not on file  Occupational History  . Occupation: Disabled  Social Needs  . Financial resource strain: Not on file  . Food insecurity    Worry: Not on file    Inability: Not on file  . Transportation needs    Medical: Not on file    Non-medical: Not on file  Tobacco Use  . Smoking status: Never Smoker   . Smokeless tobacco: Never Used  Substance and Sexual Activity  . Alcohol use: No    Alcohol/week: 0.0 standard drinks  . Drug use: No  . Sexual activity: Yes  Lifestyle  . Physical activity    Days per week: Not on file    Minutes per session: Not on file  . Stress: Not on file  Relationships  . Social Herbalist on phone: Not on file    Gets together: Not on file    Attends religious service: Not on file    Active member of club or organization: Not on file    Attends meetings of clubs or organizations: Not on file    Relationship status: Not on file  Other Topics Concern  . Not on file  Social History Narrative  . Not on file   Past Surgical History:  Procedure Laterality Date  . BACK SURGERY    . BREAST LUMPECTOMY    . COLONOSCOPY     about 3 yrs ago in [place]  . KNEE SURGERY     right  . TONSILLECTOMY     Past Medical History:  Diagnosis Date  . Asthma   . Back pain   . Breast cancer ([place]) 2008   left  . Diabetes ([place])   . Hypercholesterolemia   . Hypertension   .  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 Personal history of chemotherapy   . Personal history of radiation therapy 2008   BP 134/82   Pulse 85   Temp 98.4 F (36.9 C)   Ht 5\' 4"  (1.626 m)   Wt 141 lb (64 kg)   SpO2 94%   BMI 24.20 kg/m   Opioid Risk Score:   Fall Risk Score:  `1  Depression screen PHQ 2/9  Depression screen [facility] 2/9 01/03/2019 12/11/2017 11/07/2017 04/21/2017 01/13/2017 05/27/2016 03/28/2016  Decreased Interest 0 0 0 - 0 0 0  Down, Depressed, Hopeless 0 0 0 - 0 0 0  PHQ - 2 Score 0 0 0 - 0 0 0  Altered sleeping - - - 0 - - -  Tired, decreased energy - - - 0 - - -  Change in appetite - - - - - - -  Feeling bad or failure about yourself  - - - - - - -  Trouble concentrating - - - - - - -  Moving slowly or fidgety/restless - - - - - - -  Suicidal thoughts - - - - - - -  PHQ-9 Score - - - - - - -  Difficult doing work/chores - - - - - - -   Review of  Systems  Constitutional: Negative.   HENT: Negative.   Eyes: Negative.   Respiratory: Negative.   Cardiovascular: Positive for leg swelling.  Gastrointestinal: Negative.   Genitourinary: Negative.   Musculoskeletal:       Spasms  Skin: Negative.   Allergic/Immunologic: Negative.   Neurological: Positive for numbness.       Tingling  Psychiatric/Behavioral: Negative.   All other systems reviewed and are negative.      Objective:   Physical Exam Vitals signs and nursing note reviewed.  Constitutional:      Appearance: Normal appearance.  Neck:     Musculoskeletal: Normal range of motion and neck supple.  Cardiovascular:     Rate and Rhythm: Normal rate and regular rhythm.     Pulses: Normal pulses.     Heart sounds: Normal heart sounds.  Pulmonary:     Effort: Pulmonary effort is normal.     Breath sounds: Normal breath sounds.  Musculoskeletal:     Comments: Normal Muscle Bulk and Muscle Testing Reveals:  Upper Extremities: Right: Full ROM and Muscle Strength 4/5 Left: Decreased ROM 30 Degrees and Muscle Strength 3/5  Thoracic Paraspinal Tenderness: T-7-T-9 Lumbar Hypersensitivity Right Greater Trochanter Tenderness Lower Extremities: Right: Decreased ROM and Muscle Strength 5/5 Right Lower Extremity Flexion Produces Pain into her Lumbar and Right Lower Extremity Left: Full ROM and Muscle Strength 5/5 Arises from Table Slowly using cane for support Antalgic Gait   Neurological:     Mental Status: She is alert and oriented to person, place, and time.  Psychiatric:        Mood and Affect: Mood normal.        Behavior: Behavior normal.           Assessment & Plan:  1. Chronic low back pain, Lumbar Facet Arthropathy,lumbar spondylosis, spondylolisthesis, with lumbar radiculopathy.Continuecurrentmedication regimen:01/03/2019 Refilled: Fentanyl 12 mcg one patch every three days #10 and Oxycodone 10/325 mg one tablet every 8 hours as needed for pain #90.  We  will continue the opioid monitoring program, this consists of regular clinic visits, examinations, urine drug screen, pill counts as well as use of [place] Controlled Substance reporting System. 2.  Chemotherapy induced peripheral neuropathy:ContinueLyrica.01/03/2019 3. Chronic Midline Thoracic Pain: Continue HEP and Current Medication Regime.01/03/2019 4. Muscle Spasm: Continuemedication regimen withRobaxin12/04/2018 5. Right Knee Pain:No complaints today.Continue Current Medication Regime: Continue HEP as ToleratedandContinue to Monitor.01/03/2019 6. Right Greater Trochanteric Bursitis:No complaints Today.Continue to Alternate Ice and Heat Therapy.Continue to Monitor.01/03/2019 7. Chronic Pain Syndrome: ContinueAmitriptyline, Fentanyly and Oxycodone. Continue to Monitor.01/03/2019  F/U in 1 month   44minutes of face to face patient care time was spent during this visit. All questions were encouraged and answered.

## 2019-01-17 DIAGNOSIS — I10 Essential (primary) hypertension: Secondary | ICD-10-CM | POA: Diagnosis not present

## 2019-01-17 DIAGNOSIS — J4520 Mild intermittent asthma, uncomplicated: Secondary | ICD-10-CM | POA: Diagnosis not present

## 2019-01-17 DIAGNOSIS — Z0001 Encounter for general adult medical examination with abnormal findings: Secondary | ICD-10-CM | POA: Diagnosis not present

## 2019-01-17 DIAGNOSIS — E119 Type 2 diabetes mellitus without complications: Secondary | ICD-10-CM | POA: Diagnosis not present

## 2019-01-24 DIAGNOSIS — E1165 Type 2 diabetes mellitus with hyperglycemia: Secondary | ICD-10-CM | POA: Diagnosis not present

## 2019-01-24 DIAGNOSIS — Z0001 Encounter for general adult medical examination with abnormal findings: Secondary | ICD-10-CM | POA: Diagnosis not present

## 2019-01-24 DIAGNOSIS — I10 Essential (primary) hypertension: Secondary | ICD-10-CM | POA: Diagnosis not present

## 2019-02-07 ENCOUNTER — Other Ambulatory Visit: Payer: Self-pay

## 2019-02-07 ENCOUNTER — Encounter: Payer: BC Managed Care – PPO | Attending: Physical Medicine & Rehabilitation | Admitting: Registered Nurse

## 2019-02-07 ENCOUNTER — Encounter: Payer: Self-pay | Admitting: Registered Nurse

## 2019-02-07 VITALS — BP 123/82 | HR 101 | Temp 97.7°F | Ht 63.0 in | Wt 140.0 lb

## 2019-02-07 DIAGNOSIS — M47816 Spondylosis without myelopathy or radiculopathy, lumbar region: Secondary | ICD-10-CM | POA: Insufficient documentation

## 2019-02-07 DIAGNOSIS — M7061 Trochanteric bursitis, right hip: Secondary | ICD-10-CM

## 2019-02-07 DIAGNOSIS — Z5181 Encounter for therapeutic drug level monitoring: Secondary | ICD-10-CM | POA: Insufficient documentation

## 2019-02-07 DIAGNOSIS — M62838 Other muscle spasm: Secondary | ICD-10-CM

## 2019-02-07 DIAGNOSIS — Z853 Personal history of malignant neoplasm of breast: Secondary | ICD-10-CM | POA: Diagnosis not present

## 2019-02-07 DIAGNOSIS — Z9221 Personal history of antineoplastic chemotherapy: Secondary | ICD-10-CM | POA: Diagnosis not present

## 2019-02-07 DIAGNOSIS — M545 Low back pain: Secondary | ICD-10-CM | POA: Diagnosis not present

## 2019-02-07 DIAGNOSIS — G894 Chronic pain syndrome: Secondary | ICD-10-CM | POA: Insufficient documentation

## 2019-02-07 DIAGNOSIS — M546 Pain in thoracic spine: Secondary | ICD-10-CM

## 2019-02-07 DIAGNOSIS — G620 Drug-induced polyneuropathy: Secondary | ICD-10-CM | POA: Diagnosis not present

## 2019-02-07 DIAGNOSIS — Z79891 Long term (current) use of opiate analgesic: Secondary | ICD-10-CM | POA: Diagnosis not present

## 2019-02-07 DIAGNOSIS — M5416 Radiculopathy, lumbar region: Secondary | ICD-10-CM | POA: Diagnosis not present

## 2019-02-07 DIAGNOSIS — T451X5A Adverse effect of antineoplastic and immunosuppressive drugs, initial encounter: Secondary | ICD-10-CM

## 2019-02-07 DIAGNOSIS — G8929 Other chronic pain: Secondary | ICD-10-CM | POA: Insufficient documentation

## 2019-02-07 DIAGNOSIS — I890 Lymphedema, not elsewhere classified: Secondary | ICD-10-CM | POA: Insufficient documentation

## 2019-02-07 MED ORDER — FENTANYL 12 MCG/HR TD PT72: 1.0000 | MEDICATED_PATCH | TRANSDERMAL | 0 refills | Status: DC

## 2019-02-07 MED ORDER — OXYCODONE-ACETAMINOPHEN 10-325 MG PO TABS: 1.0000 | ORAL_TABLET | Freq: Three times a day (TID) | ORAL | 0 refills | Status: DC | PRN

## 2019-02-07 MED ORDER — PREGABALIN 100 MG PO CAPS: 100.0000 mg | ORAL_CAPSULE | Freq: Two times a day (BID) | ORAL | 3 refills | Status: DC

## 2019-02-07 NOTE — Progress Notes (Signed)
Subjective:    Patient ID: Krista Doyle, female    DOB: January 10, 1961, 59 y.o.   MRN: [id number]  HPI: Krista Doyle is a 59 y.o. female who returns for follow up appointment for chronic pain and medication refill. She states her pain is located in her mid- lower back mainly right side radiating into her right hip and right lower extremity. She rates her pain 6. Her current exercise regime is walking, ball therapy and performing stretching exercises.  Ms. Sortino Morphine equivalent is 73.80 MME.  Last Oral Swab was Performed on 12/07/2018, it was consistent.   Pain Inventory Average Pain 8 Pain Right Now 6 My pain is constant, sharp, dull and aching  In the last 24 hours, has pain interfered with the following? General activity 6 Relation with others 7 Enjoyment of life 8 What TIME of day is your pain at its worst? night Sleep (in general) Fair  Pain is worse with: walking, bending, sitting and standing Pain improves with: rest, heat/ice, medication and TENS Relief from Meds: 8  Mobility walk with assistance use a cane use a walker ability to climb steps?  yes do you drive?  yes  Function Do you have any goals in this area?  no  Neuro/Psych weakness numbness tingling trouble walking spasms  Prior Studies Any changes since last visit?  no  Physicians involved in your care Any changes since last visit?  no   Family History  Problem Relation Age of Onset  . Colon cancer Neg Hx    Social History   Socioeconomic History  . Marital status: Married    Spouse name: Not on file  . Number of children: Not on file  . Years of education: Not on file  . Highest education level: Not on file  Occupational History  . Occupation: Disabled  Tobacco Use  . Smoking status: Never Smoker  . Smokeless tobacco: Never Used  Substance and Sexual Activity  . Alcohol use: No    Alcohol/week: 0.0 standard drinks  . Drug use: No  . Sexual activity: Yes   Other Topics Concern  . Not on file  Social History Narrative  . Not on file   Social Determinants of Health   Financial Resource Strain:   . Difficulty of Paying Living Expenses: Not on file  Food Insecurity:   . Worried About Charity fundraiser in the Last Year: Not on file  . Ran Out of Food in the Last Year: Not on file  Transportation Needs:   . Lack of Transportation (Medical): Not on file  . Lack of Transportation (Non-Medical): Not on file  Physical Activity:   . Days of Exercise per Week: Not on file  . Minutes of Exercise per Session: Not on file  Stress:   . Feeling of Stress : Not on file  Social Connections:   . Frequency of Communication with Friends and Family: Not on file  . Frequency of Social Gatherings with Friends and Family: Not on file  . Attends Religious Services: Not on file  . Active Member of Clubs or Organizations: Not on file  . Attends Archivist Meetings: Not on file  . Marital Status: Not on file   Past Surgical History:  Procedure Laterality Date  . BACK SURGERY    . BREAST LUMPECTOMY    . COLONOSCOPY     about 3 yrs ago in [place]  . KNEE SURGERY     right  .  TONSILLECTOMY     Past Medical History:  Diagnosis Date  . Asthma   . Back pain   . Breast cancer ([place]) 2008   left  . Diabetes ([place])   . Hypercholesterolemia   . Hypertension   . 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 Personal history of chemotherapy   . Personal history of radiation therapy 2008   BP 123/82   Pulse (!) 101   Temp 97.7 F (36.5 C)   Ht 5\' 3"  (1.6 m)   Wt 140 lb (63.5 kg)   SpO2 98%   BMI 24.80 kg/m   Opioid Risk Score:   Fall Risk Score:  `1  Depression screen PHQ 2/9  Depression screen [facility] 2/9 01/03/2019 12/11/2017 11/07/2017 04/21/2017 01/13/2017 05/27/2016 03/28/2016  Decreased Interest 0 0 0 - 0 0 0  Down, Depressed, Hopeless 0 0 0 - 0 0 0  PHQ - 2 Score 0 0 0 - 0 0 0  Altered sleeping - - - 0 - - -  Tired, decreased  energy - - - 0 - - -  Change in appetite - - - - - - -  Feeling bad or failure about yourself  - - - - - - -  Trouble concentrating - - - - - - -  Moving slowly or fidgety/restless - - - - - - -  Suicidal thoughts - - - - - - -  PHQ-9 Score - - - - - - -  Difficult doing work/chores - - - - - - -    Review of Systems  Constitutional: Negative.   HENT: Negative.   Eyes: Negative.   Respiratory: Negative.   Cardiovascular: Positive for leg swelling.  Gastrointestinal: Negative.   Endocrine: Negative.   Genitourinary: Negative.   Musculoskeletal: Positive for arthralgias, back pain, gait problem and myalgias.  Skin: Negative.   Neurological: Positive for weakness and numbness.  Hematological: Negative.   Psychiatric/Behavioral: Negative.   All other systems reviewed and are negative.      Objective:   Physical Exam Vitals and nursing note reviewed.  Constitutional:      Appearance: Normal appearance.  Cardiovascular:     Rate and Rhythm: Normal rate and regular rhythm.     Pulses: Normal pulses.     Heart sounds: Normal heart sounds.  Pulmonary:     Effort: Pulmonary effort is normal.     Breath sounds: Normal breath sounds.  Musculoskeletal:     Cervical back: Normal range of motion and neck supple.     Comments: Normal Muscle Bulk and Muscle Testing Reveals:  Upper Extremities: Right: Decreased ROM 90 Degrees  and Muscle Strength 4/5  Left: Decreased ROM 30 Degrees and Muscle Strength 3/5  Thoracic Hypersensitivity: T-3-T-6 Mainly Right Side Lumbar Paraspinal Tenderness: L-3-L-5 Right Greater Trochanter Tenderness Lower Extremities: Full ROM and Muscle Strength 5/5 Arises from Table slowly using cane for support Antalgic Gait   Skin:    General: Skin is warm and dry.  Neurological:     Mental Status: She is alert and oriented to person, place, and time.  Psychiatric:        Mood and Affect: Mood normal.        Behavior: Behavior normal.            Assessment & Plan:  1. Chronic low back pain, Lumbar Facet Arthropathy,lumbar spondylosis, spondylolisthesis, with lumbar radiculopathy.Continuecurrentmedication regimen:02/07/2019 Refilled: Fentanyl 12 mcg one patch every three days #10 and Oxycodone 10/325  mg one tablet every 8 hours as needed for pain #90.  We will continue the opioid monitoring program, this consists of regular clinic visits, examinations, urine drug screen, pill counts as well as use of [place] Controlled Substance reporting System. 2. Chemotherapy induced peripheral neuropathy:ContinueLyrica.02/07/2019 3. Chronic Midline Thoracic Pain: Continue HEP and Current Medication Regime.02/07/2019 4. Muscle Spasm: Continuemedication regimen withRobaxin01/08/2019 5. Right Knee Pain:No complaints today.Continue Current Medication Regime: Continue HEP as ToleratedandContinue to Monitor.02/07/2019 6. Right Greater Trochanteric Bursitis:Continue to Alternate Ice and Heat Therapy.Continue to Monitor.02/07/2019 7. Chronic Pain Syndrome: ContinueAmitriptyline, Fentanyly and Oxycodone. Continue to Monitor.02/07/2019  F/U in 1 month   24minutes of face to face patient care time was spent during this visit. All questions were encouraged and answered.

## 2019-02-28 ENCOUNTER — Other Ambulatory Visit: Payer: Self-pay | Admitting: Registered Nurse

## 2019-02-28 DIAGNOSIS — G894 Chronic pain syndrome: Secondary | ICD-10-CM

## 2019-02-28 DIAGNOSIS — T451X5A Adverse effect of antineoplastic and immunosuppressive drugs, initial encounter: Secondary | ICD-10-CM

## 2019-02-28 DIAGNOSIS — G620 Drug-induced polyneuropathy: Secondary | ICD-10-CM

## 2019-03-11 ENCOUNTER — Other Ambulatory Visit: Payer: Self-pay

## 2019-03-11 ENCOUNTER — Encounter: Payer: BC Managed Care – PPO | Attending: Physical Medicine & Rehabilitation | Admitting: Registered Nurse

## 2019-03-11 ENCOUNTER — Encounter: Payer: Self-pay | Admitting: Registered Nurse

## 2019-03-11 VITALS — BP 130/85 | HR 87 | Temp 97.7°F | Ht 63.0 in | Wt 139.0 lb

## 2019-03-11 DIAGNOSIS — M62838 Other muscle spasm: Secondary | ICD-10-CM | POA: Insufficient documentation

## 2019-03-11 DIAGNOSIS — M7061 Trochanteric bursitis, right hip: Secondary | ICD-10-CM | POA: Diagnosis not present

## 2019-03-11 DIAGNOSIS — M5416 Radiculopathy, lumbar region: Secondary | ICD-10-CM | POA: Diagnosis not present

## 2019-03-11 DIAGNOSIS — Z9221 Personal history of antineoplastic chemotherapy: Secondary | ICD-10-CM | POA: Insufficient documentation

## 2019-03-11 DIAGNOSIS — Z853 Personal history of malignant neoplasm of breast: Secondary | ICD-10-CM | POA: Diagnosis not present

## 2019-03-11 DIAGNOSIS — Z5181 Encounter for therapeutic drug level monitoring: Secondary | ICD-10-CM | POA: Insufficient documentation

## 2019-03-11 DIAGNOSIS — T451X5A Adverse effect of antineoplastic and immunosuppressive drugs, initial encounter: Secondary | ICD-10-CM | POA: Insufficient documentation

## 2019-03-11 DIAGNOSIS — M545 Low back pain: Secondary | ICD-10-CM | POA: Insufficient documentation

## 2019-03-11 DIAGNOSIS — I890 Lymphedema, not elsewhere classified: Secondary | ICD-10-CM | POA: Diagnosis not present

## 2019-03-11 DIAGNOSIS — Z79891 Long term (current) use of opiate analgesic: Secondary | ICD-10-CM | POA: Diagnosis not present

## 2019-03-11 DIAGNOSIS — G620 Drug-induced polyneuropathy: Secondary | ICD-10-CM | POA: Diagnosis not present

## 2019-03-11 DIAGNOSIS — G8929 Other chronic pain: Secondary | ICD-10-CM | POA: Insufficient documentation

## 2019-03-11 DIAGNOSIS — G894 Chronic pain syndrome: Secondary | ICD-10-CM | POA: Diagnosis not present

## 2019-03-11 DIAGNOSIS — M546 Pain in thoracic spine: Secondary | ICD-10-CM

## 2019-03-11 DIAGNOSIS — M47816 Spondylosis without myelopathy or radiculopathy, lumbar region: Secondary | ICD-10-CM | POA: Diagnosis not present

## 2019-03-11 MED ORDER — OXYCODONE-ACETAMINOPHEN 10-325 MG PO TABS: 1.0000 | ORAL_TABLET | Freq: Three times a day (TID) | ORAL | 0 refills | Status: DC | PRN

## 2019-03-11 MED ORDER — METHOCARBAMOL 500 MG PO TABS: ORAL_TABLET | ORAL | 2 refills | Status: DC

## 2019-03-11 MED ORDER — FENTANYL 12 MCG/HR TD PT72: 1.0000 | MEDICATED_PATCH | TRANSDERMAL | 0 refills | Status: DC

## 2019-03-11 NOTE — Progress Notes (Signed)
Subjective:    Patient ID: Krista Doyle, female    DOB: 23-Feb-1960, 59 y.o.   MRN: [id number]  HPI: Krista Doyle is a 59 y.o. female who returns for follow up appointment for chronic pain and medication refill. She states her pain is located in her  Mid- lower back radiating into her right hip and right lower extremity. She rates her pain 5. Her current exercise regime is walking, ball therapy and performing stretching exercises.  Krista Doyle Morphine equivalent is 73.80 MME.    Last Oral Swab was Performed on 12/07/2018, it was consistent.    Pain Inventory Average Pain 7 Pain Right Now 5 My pain is constant, sharp and stabbing  In the last 24 hours, has pain interfered with the following? General activity 7 Relation with others 8 Enjoyment of life 7 What TIME of day is your pain at its worst? night Sleep (in general) Poor  Pain is worse with: walking, bending, sitting, standing and some activites Pain improves with: rest, heat/ice, medication and TENS Relief from Meds: 7  Mobility walk with assistance use a cane use a walker ability to climb steps?  yes do you drive?  yes Do you have any goals in this area?  yes  Function disabled: date disabled . I need assistance with the following:  household duties Do you have any goals in this area?  no  Neuro/Psych numbness spasms  Prior Studies Any changes since last visit?  no  Physicians involved in your care Any changes since last visit?  no   Family History  Problem Relation Age of Onset  . Colon cancer Neg Hx    Social History   Socioeconomic History  . Marital status: Married    Spouse name: Not on file  . Number of children: Not on file  . Years of education: Not on file  . Highest education level: Not on file  Occupational History  . Occupation: Disabled  Tobacco Use  . Smoking status: Never Smoker  . Smokeless tobacco: Never Used  Substance and Sexual Activity  . Alcohol  use: No    Alcohol/week: 0.0 standard drinks  . Drug use: No  . Sexual activity: Yes  Other Topics Concern  . Not on file  Social History Narrative  . Not on file   Social Determinants of Health   Financial Resource Strain:   . Difficulty of Paying Living Expenses: Not on file  Food Insecurity:   . Worried About Charity fundraiser in the Last Year: Not on file  . Ran Out of Food in the Last Year: Not on file  Transportation Needs:   . Lack of Transportation (Medical): Not on file  . Lack of Transportation (Non-Medical): Not on file  Physical Activity:   . Days of Exercise per Week: Not on file  . Minutes of Exercise per Session: Not on file  Stress:   . Feeling of Stress : Not on file  Social Connections:   . Frequency of Communication with Friends and Family: Not on file  . Frequency of Social Gatherings with Friends and Family: Not on file  . Attends Religious Services: Not on file  . Active Member of Clubs or Organizations: Not on file  . Attends Archivist Meetings: Not on file  . Marital Status: Not on file   Past Surgical History:  Procedure Laterality Date  . BACK SURGERY    . BREAST LUMPECTOMY    . COLONOSCOPY  about 3 yrs ago in [place]  . KNEE SURGERY     right  . TONSILLECTOMY     Past Medical History:  Diagnosis Date  . Asthma   . Back pain   . Breast cancer ([place]) 2008   left  . Diabetes ([place])   . Hypercholesterolemia   . Hypertension   . 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 Personal history of chemotherapy   . Personal history of radiation therapy 2008   BP 130/85   Pulse 87   Temp 97.7 F (36.5 C)   Ht 5\' 3"  (1.6 m)   Wt 139 lb (63 kg)   SpO2 99%   BMI 24.62 kg/m   Opioid Risk Score:   Fall Risk Score:  `1  Depression screen PHQ 2/9  Depression screen [facility] 2/9 01/03/2019 12/11/2017 11/07/2017 04/21/2017 01/13/2017 05/27/2016 03/28/2016  Decreased Interest 0 0 0 - 0 0 0  Down, Depressed, Hopeless 0 0 0 - 0 0 0  PHQ  - 2 Score 0 0 0 - 0 0 0  Altered sleeping - - - 0 - - -  Tired, decreased energy - - - 0 - - -  Change in appetite - - - - - - -  Feeling bad or failure about yourself  - - - - - - -  Trouble concentrating - - - - - - -  Moving slowly or fidgety/restless - - - - - - -  Suicidal thoughts - - - - - - -  PHQ-9 Score - - - - - - -  Difficult doing work/chores - - - - - - -    Review of Systems  Constitutional: Negative.   HENT: Negative.   Eyes: Negative.   Respiratory: Negative.   Cardiovascular: Positive for leg swelling.  Endocrine: Negative.   Genitourinary: Negative.   Musculoskeletal: Positive for back pain.       Spasms  Skin: Negative.   Allergic/Immunologic: Negative.   Neurological: Positive for numbness.  Hematological: Negative.   Psychiatric/Behavioral: Negative.   All other systems reviewed and are negative.      Objective:   Physical Exam Vitals and nursing note reviewed.  Constitutional:      Appearance: Normal appearance.  Cardiovascular:     Rate and Rhythm: Normal rate and regular rhythm.     Pulses: Normal pulses.     Heart sounds: Normal heart sounds.  Musculoskeletal:     Cervical back: Normal range of motion and neck supple.     Comments: Normal Muscle Bulk and Muscle Testing Reveals:  Upper Extremities: Right: Full ROM and Muscle Strength 5/5 Right AC Joint Tenderness Left: Decreased ROM 45 Degrees and Muscle Strength 3/5  Thoracic Paraspinal Tenderness: T-1-T-7 Mainly Right Side Lumbar Paraspinal Tenderness: L-3-L-5 Lower Extremities: Right: Decreased ROM and Muscle Strength 5/5 Right Lower Extremity Flexion Produces Pain into her Lower Extremity Left: Full ROM and Muscle Strength 5/5 Arises from Table slowly using cane for support Antalgic Gait   Skin:    General: Skin is warm and dry.  Neurological:     Mental Status: She is alert and oriented to person, place, and time.  Psychiatric:        Mood and Affect: Mood normal.         Behavior: Behavior normal.           Assessment & Plan:  1. Chronic low back pain, Lumbar Facet Arthropathy,lumbar spondylosis, spondylolisthesis, with lumbar radiculopathy.Continuecurrentmedication regimen:03/11/2019 Refilled:  Fentanyl 12 mcg one patch every three days #10 and Oxycodone 10/325 mg one tablet every 8 hours as needed for pain #90.  We will continue the opioid monitoring program, this consists of regular clinic visits, examinations, urine drug screen, pill counts as well as use of [place] Controlled Substance reporting System. 2. Chemotherapy induced peripheral neuropathy:ContinueLyrica.03/11/2019 3. Chronic Midline Thoracic Pain: Continue HEP and Current Medication Regime.03/11/2019 4. Muscle Spasm: Continuemedication regimen withRobaxin01/08/2019 5. Right Knee Pain:No complaints today.Continue Current Medication Regime: Continue HEP as ToleratedandContinue to Monitor.03/12/2019 6. Right Greater Trochanteric Bursitis:Continue to Alternate Ice and Heat Therapy.Continue to Monitor.03/11/2019 7. Chronic Pain Syndrome: ContinueAmitriptyline, Fentanyly and Oxycodone. Continue to Monitor.03/11/2019  F/U in 1 month   76minutes of face to face patient care time was spent during this visit. All questions were encouraged and answered.

## 2019-03-21 ENCOUNTER — Inpatient Hospital Stay (HOSPITAL_COMMUNITY): Admission: RE | Admit: 2019-03-21 | Payer: BC Managed Care – PPO | Source: Ambulatory Visit

## 2019-03-21 ENCOUNTER — Inpatient Hospital Stay (HOSPITAL_COMMUNITY): Payer: BC Managed Care – PPO

## 2019-03-21 ENCOUNTER — Ambulatory Visit (HOSPITAL_COMMUNITY): Payer: BC Managed Care – PPO

## 2019-03-27 ENCOUNTER — Inpatient Hospital Stay (HOSPITAL_COMMUNITY): Payer: BC Managed Care – PPO | Attending: Hematology

## 2019-03-27 ENCOUNTER — Encounter (HOSPITAL_COMMUNITY): Payer: Self-pay

## 2019-03-27 ENCOUNTER — Other Ambulatory Visit: Payer: Self-pay

## 2019-03-27 ENCOUNTER — Ambulatory Visit (HOSPITAL_COMMUNITY)
Admission: RE | Admit: 2019-03-27 | Discharge: 2019-03-27 | Disposition: A | Discharge status: Home / Self Care | Payer: BC Managed Care – PPO | Source: Ambulatory Visit | Attending: Nurse Practitioner | Admitting: Nurse Practitioner

## 2019-03-27 ENCOUNTER — Other Ambulatory Visit (HOSPITAL_COMMUNITY): Payer: BC Managed Care – PPO

## 2019-03-27 DIAGNOSIS — E7800 Pure hypercholesterolemia, unspecified: Secondary | ICD-10-CM | POA: Diagnosis not present

## 2019-03-27 DIAGNOSIS — I890 Lymphedema, not elsewhere classified: Secondary | ICD-10-CM | POA: Diagnosis not present

## 2019-03-27 DIAGNOSIS — M549 Dorsalgia, unspecified: Secondary | ICD-10-CM | POA: Diagnosis not present

## 2019-03-27 DIAGNOSIS — M8580 Other specified disorders of bone density and structure, unspecified site: Secondary | ICD-10-CM | POA: Insufficient documentation

## 2019-03-27 DIAGNOSIS — E119 Type 2 diabetes mellitus without complications: Secondary | ICD-10-CM | POA: Insufficient documentation

## 2019-03-27 DIAGNOSIS — Z853 Personal history of malignant neoplasm of breast: Secondary | ICD-10-CM | POA: Insufficient documentation

## 2019-03-27 DIAGNOSIS — Z923 Personal history of irradiation: Secondary | ICD-10-CM | POA: Insufficient documentation

## 2019-03-27 DIAGNOSIS — Z170 Estrogen receptor positive status [ER+]: Secondary | ICD-10-CM | POA: Diagnosis not present

## 2019-03-27 DIAGNOSIS — Z1231 Encounter for screening mammogram for malignant neoplasm of breast: Secondary | ICD-10-CM | POA: Diagnosis not present

## 2019-03-27 DIAGNOSIS — Z794 Long term (current) use of insulin: Secondary | ICD-10-CM | POA: Diagnosis not present

## 2019-03-27 DIAGNOSIS — I10 Essential (primary) hypertension: Secondary | ICD-10-CM | POA: Diagnosis not present

## 2019-03-27 DIAGNOSIS — Z79899 Other long term (current) drug therapy: Secondary | ICD-10-CM | POA: Diagnosis not present

## 2019-03-27 DIAGNOSIS — C50412 Malignant neoplasm of upper-outer quadrant of left female breast: Secondary | ICD-10-CM | POA: Diagnosis not present

## 2019-03-27 DIAGNOSIS — R928 Other abnormal and inconclusive findings on diagnostic imaging of breast: Secondary | ICD-10-CM | POA: Diagnosis not present

## 2019-03-27 DIAGNOSIS — G8929 Other chronic pain: Secondary | ICD-10-CM | POA: Diagnosis not present

## 2019-03-27 DIAGNOSIS — Z79811 Long term (current) use of aromatase inhibitors: Secondary | ICD-10-CM | POA: Diagnosis not present

## 2019-03-27 DIAGNOSIS — J45909 Unspecified asthma, uncomplicated: Secondary | ICD-10-CM | POA: Diagnosis not present

## 2019-03-27 DIAGNOSIS — M8589 Other specified disorders of bone density and structure, multiple sites: Secondary | ICD-10-CM | POA: Diagnosis not present

## 2019-03-27 DIAGNOSIS — Z7951 Long term (current) use of inhaled steroids: Secondary | ICD-10-CM | POA: Diagnosis not present

## 2019-03-27 DIAGNOSIS — Z9221 Personal history of antineoplastic chemotherapy: Secondary | ICD-10-CM | POA: Insufficient documentation

## 2019-03-27 DIAGNOSIS — Z780 Asymptomatic menopausal state: Secondary | ICD-10-CM | POA: Diagnosis not present

## 2019-03-27 LAB — CBC WITH DIFFERENTIAL/PLATELET
Abs Immature Granulocytes: 0.03 10*3/uL (ref 0.00–0.07)
Basophils Absolute: 0.1 10*3/uL (ref 0.0–0.1)
Basophils Relative: 1 %
Eosinophils Absolute: 0 10*3/uL (ref 0.0–0.5)
Eosinophils Relative: 0 %
HCT: 36 % (ref 36.0–46.0)
Hemoglobin: 11.2 g/dL — ABNORMAL LOW (ref 12.0–15.0)
Immature Granulocytes: 0 %
Lymphocytes Relative: 20 %
Lymphs Abs: 2 10*3/uL (ref 0.7–4.0)
MCH: 25.8 pg — ABNORMAL LOW (ref 26.0–34.0)
MCHC: 31.1 g/dL (ref 30.0–36.0)
MCV: 82.9 fL (ref 80.0–100.0)
Monocytes Absolute: 0.8 10*3/uL (ref 0.1–1.0)
Monocytes Relative: 8 %
Neutro Abs: 7 10*3/uL (ref 1.7–7.7)
Neutrophils Relative %: 71 %
Platelets: 326 10*3/uL (ref 150–400)
RBC: 4.34 MIL/uL (ref 3.87–5.11)
RDW: 13.5 % (ref 11.5–15.5)
WBC: 9.8 10*3/uL (ref 4.0–10.5)
nRBC: 0 % (ref 0.0–0.2)

## 2019-03-27 LAB — COMPREHENSIVE METABOLIC PANEL
ALT: 15 U/L (ref 0–44)
AST: 36 U/L (ref 15–41)
Albumin: 3.5 g/dL (ref 3.5–5.0)
Alkaline Phosphatase: 75 U/L (ref 38–126)
Anion gap: 11 (ref 5–15)
BUN: 16 mg/dL (ref 6–20)
CO2: 28 mmol/L (ref 22–32)
Calcium: 9.9 mg/dL (ref 8.9–10.3)
Chloride: 95 mmol/L — ABNORMAL LOW (ref 98–111)
Creatinine, Ser: 0.89 mg/dL (ref 0.44–1.00)
GFR calc Af Amer: >60 mL/min (ref >60)
GFR calc non Af Amer: >60 mL/min (ref >60)
Glucose, Bld: 209 mg/dL — ABNORMAL HIGH (ref 70–99)
Potassium: 3.9 mmol/L (ref 3.5–5.1)
Sodium: 134 mmol/L — ABNORMAL LOW (ref 135–145)
Total Bilirubin: 0.5 mg/dL (ref 0.3–1.2)
Total Protein: 8.7 g/dL — ABNORMAL HIGH (ref 6.5–8.1)

## 2019-03-27 LAB — VITAMIN B12: Vitamin B-12: >7500 pg/mL — ABNORMAL HIGH (ref 180–914)

## 2019-03-27 LAB — LACTATE DEHYDROGENASE: LDH: 193 U/L — ABNORMAL HIGH (ref 98–192)

## 2019-03-27 LAB — VITAMIN D 25 HYDROXY (VIT D DEFICIENCY, FRACTURES): Vit D, 25-Hydroxy: 42.58 ng/mL (ref 30–100)

## 2019-03-28 ENCOUNTER — Other Ambulatory Visit (HOSPITAL_COMMUNITY): Payer: Self-pay | Admitting: Nurse Practitioner

## 2019-03-28 ENCOUNTER — Inpatient Hospital Stay (HOSPITAL_COMMUNITY): Payer: BC Managed Care – PPO

## 2019-03-28 ENCOUNTER — Encounter (HOSPITAL_COMMUNITY): Payer: Self-pay

## 2019-03-28 ENCOUNTER — Inpatient Hospital Stay (HOSPITAL_BASED_OUTPATIENT_CLINIC_OR_DEPARTMENT_OTHER): Payer: BC Managed Care – PPO | Admitting: Nurse Practitioner

## 2019-03-28 ENCOUNTER — Encounter (HOSPITAL_COMMUNITY): Payer: Self-pay | Admitting: Nurse Practitioner

## 2019-03-28 DIAGNOSIS — C50412 Malignant neoplasm of upper-outer quadrant of left female breast: Secondary | ICD-10-CM

## 2019-03-28 DIAGNOSIS — E119 Type 2 diabetes mellitus without complications: Secondary | ICD-10-CM | POA: Diagnosis not present

## 2019-03-28 DIAGNOSIS — Z923 Personal history of irradiation: Secondary | ICD-10-CM | POA: Diagnosis not present

## 2019-03-28 DIAGNOSIS — M8580 Other specified disorders of bone density and structure, unspecified site: Secondary | ICD-10-CM

## 2019-03-28 DIAGNOSIS — Z170 Estrogen receptor positive status [ER+]: Secondary | ICD-10-CM

## 2019-03-28 DIAGNOSIS — I10 Essential (primary) hypertension: Secondary | ICD-10-CM | POA: Diagnosis not present

## 2019-03-28 DIAGNOSIS — J45909 Unspecified asthma, uncomplicated: Secondary | ICD-10-CM | POA: Diagnosis not present

## 2019-03-28 DIAGNOSIS — Z79899 Other long term (current) drug therapy: Secondary | ICD-10-CM | POA: Diagnosis not present

## 2019-03-28 DIAGNOSIS — M549 Dorsalgia, unspecified: Secondary | ICD-10-CM | POA: Diagnosis not present

## 2019-03-28 DIAGNOSIS — Z794 Long term (current) use of insulin: Secondary | ICD-10-CM | POA: Diagnosis not present

## 2019-03-28 DIAGNOSIS — Z7951 Long term (current) use of inhaled steroids: Secondary | ICD-10-CM | POA: Diagnosis not present

## 2019-03-28 DIAGNOSIS — Z79811 Long term (current) use of aromatase inhibitors: Secondary | ICD-10-CM | POA: Diagnosis not present

## 2019-03-28 DIAGNOSIS — R928 Other abnormal and inconclusive findings on diagnostic imaging of breast: Secondary | ICD-10-CM

## 2019-03-28 DIAGNOSIS — G8929 Other chronic pain: Secondary | ICD-10-CM | POA: Diagnosis not present

## 2019-03-28 DIAGNOSIS — E559 Vitamin D deficiency, unspecified: Secondary | ICD-10-CM

## 2019-03-28 DIAGNOSIS — E7800 Pure hypercholesterolemia, unspecified: Secondary | ICD-10-CM | POA: Diagnosis not present

## 2019-03-28 DIAGNOSIS — I890 Lymphedema, not elsewhere classified: Secondary | ICD-10-CM | POA: Diagnosis not present

## 2019-03-28 DIAGNOSIS — Z9221 Personal history of antineoplastic chemotherapy: Secondary | ICD-10-CM | POA: Diagnosis not present

## 2019-03-28 MED ORDER — ERGOCALCIFEROL 1.25 MG (50000 UT) PO CAPS: 50000.0000 [IU] | ORAL_CAPSULE | ORAL | 4 refills | Status: DC

## 2019-03-28 MED ORDER — DENOSUMAB 60 MG/ML ~~LOC~~ SOSY
60.0000 mg | PREFILLED_SYRINGE | Freq: Once | SUBCUTANEOUS | Status: AC
  Administered 2019-03-28: 14:00:00 60 mg via SUBCUTANEOUS
  Filled 2019-03-28: qty 1

## 2019-03-28 NOTE — Assessment & Plan Note (Addendum)
1.  Left breast cancer: - She was diagnosed with breast cancer originally in 2008.  ER+, PR+, HER-2+ -She reportedly completed 1 year of Herceptin. - She took tamoxifen for 5 years.  She was previously followed by Dr. Talbert Cage. - She completed 5 years of Femara in 2018.  She is planning to stay on Femara for additional 5 years which will be complete 2023. -Last mammogram done on 03/14/2018 was BI-RADS Category 1 negative. -Patient reports she does have mild lymphedema in her left arm.  She uses her machine twice daily and wears a sleeve both which help. - Physical assessment today did not reveal any palpable masses or adenopathy. -Last mammogram done on 03/27/2019 showed BI-RADS Category 0 incomplete.  Showed possible right breast mass.  She will follow up with an ultrasound time possible biopsy. -Labs done on 03/27/2019 showed potassium 3.9, creatinine 0.89, LDH 193, WBC 9.8, hemoglobin 11.2, platelets 326. -She will make a follow-up appointment after her ultrasound and possible biopsy if needed. -Otherwise we will see her in 6 months with repeat labs.  2.  Back pain: -Patient has chronic back pain. -She had an MRI of the spine done on 10/17/2014 that showed spinal stenosis. -She had a bone scan done on 05/10/2017 showed question of compression fractures. - She underwent a thoracic x-ray as well as sternal x-rays on 09/20/2017 that showed DJD and osteopenia. -She is being followed by pain management and may need an orthopedic evaluation if symptoms ongoing. - Patient was previously followed by Dr. Walden Field.  She did an SPEP which was negative.  3.  Osteopenia: -Her bone density test done on 03/27/2019 showed a T score of -1.0 osteopenia. -She is being treated with Prolia every 6 months. - She will continue taking calcium and vitamin D daily. -She will get her Prolia injection today.

## 2019-03-28 NOTE — Progress Notes (Signed)
Patient taking calcium as directed.  Denied tooth, jaw, and leg pain.  No recent or upcoming dental visits.  Patient tolerated injection with no complaints voiced.  Site clean and dry with no bruising or swelling noted at site.  Band aid applied.  Vss with discharge and left ambulatory with no s/s of distress noted.  

## 2019-03-28 NOTE — Patient Instructions (Addendum)
[facility]  Discharge Instructions: Follow up in 6 months with labs   You saw Randi Lockamy, NP, today. _______________________________________________________________  Thank you for choosing [facility] to provide your oncology and hematology care.  To afford each patient quality time with our providers, please arrive at least 15 minutes before your scheduled appointment.  You need to re-schedule your appointment if you arrive 10 or more minutes late.  We strive to give you quality time with our providers, and arriving late affects you and other patients whose appointments are after yours.  Also, if you no show three or more times for appointments you may be dismissed from the clinic.  Again, thank you for choosing [facility]. Our hope is that these requests will allow you access to exceptional care and in a timely manner. _______________________________________________________________  If you have questions after your visit, please contact our office at [phone] between the hours of 8:30 a.m. and 5:00 p.m. Voicemails left after 4:30 p.m. will not be returned until the following business day. _______________________________________________________________  For prescription refill requests, have your pharmacy contact our office. _______________________________________________________________  Recommendations made by the consultant and any test results will be sent to your referring physician. _______________________________________________________________ 

## 2019-03-28 NOTE — Progress Notes (Signed)
[place], [place] [address]   CLINIC:  Medical Oncology/Hematology  PCP:  [name], MD [place] [address] [phone]   REASON FOR VISIT: Follow-up for breast cancer  CURRENT THERAPY: Letrozole   INTERVAL HISTORY:  [name] 59 y.o. female returns for routine follow-up for breast cancer.  Patient reports she is doing well since her last visit.  She reports she is still having right hip pain.  This pain has been ongoing since her accident.  She feels that it is her sciatic nerve.  She is already on oxycodone and fentanyl patches. Denies any nausea, vomiting, or diarrhea. Denies any new pains. Had not noticed any recent bleeding such as epistaxis, hematuria or hematochezia. Denies recent chest pain on exertion, shortness of breath on minimal exertion, pre-syncopal episodes, or palpitations. Denies any numbness or tingling in hands or feet. Denies any recent fevers, infections, or recent hospitalizations. Patient reports appetite at 0% and energy level at 75%.  Her appetite is severely decreased and she has lost 10 pounds in the past 3 months.  She reports her decreased appetite is from her pain.     REVIEW OF SYSTEMS:  Review of Systems  Musculoskeletal:       Hip pain  Neurological: Positive for numbness.  All other systems reviewed and are negative.    PAST MEDICAL/SURGICAL HISTORY:  Past Medical History:  Diagnosis Date  . Asthma   . Back pain   . Breast cancer ([place]) 2008   left  . Diabetes ([place])   . Hypercholesterolemia   . Hypertension   . 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 Personal history of chemotherapy   . Personal history of radiation therapy 2008   Past Surgical History:  Procedure Laterality Date  . BACK SURGERY    . BREAST LUMPECTOMY    . COLONOSCOPY     about 3 yrs ago in [place]  . KNEE SURGERY     right  . TONSILLECTOMY       SOCIAL HISTORY:  Social  History   Socioeconomic History  . Marital status: Married    Spouse name: Not on file  . Number of children: Not on file  . Years of education: Not on file  . Highest education level: Not on file  Occupational History  . Occupation: Disabled  Tobacco Use  . Smoking status: Never Smoker  . Smokeless tobacco: Never Used  Substance and Sexual Activity  . Alcohol use: No    Alcohol/week: 0.0 standard drinks  . Drug use: No  . Sexual activity: Yes  Other Topics Concern  . Not on file  Social History Narrative  . Not on file   Social Determinants of Health   Financial Resource Strain:   . Difficulty of Paying Living Expenses: Not on file  Food Insecurity:   . Worried About Charity fundraiser in the Last Year: Not on file  . Ran Out of Food in the Last Year: Not on file  Transportation Needs:   . Lack of Transportation (Medical): Not on file  . Lack of Transportation (Non-Medical): Not on file  Physical Activity:   . Days of Exercise per Week: Not on file  . Minutes of Exercise per Session: Not on file  Stress:   . Feeling of Stress : Not on file  Social Connections:   . Frequency of Communication with Friends and Family: Not on file  .  Frequency of Social Gatherings with Friends and Family: Not on file  . Attends Religious Services: Not on file  . Active Member of Clubs or Organizations: Not on file  . Attends Archivist Meetings: Not on file  . Marital Status: Not on file  Intimate Partner Violence:   . Fear of Current or Ex-Partner: Not on file  . Emotionally Abused: Not on file  . Physically Abused: Not on file  . Sexually Abused: Not on file    FAMILY HISTORY:  Family History  Problem Relation Age of Onset  . Colon cancer Neg Hx     CURRENT MEDICATIONS:  Outpatient Encounter Medications as of 03/28/2019  Medication Sig Note  . albuterol (PROVENTIL HFA;VENTOLIN HFA) 108 (90 Base) MCG/ACT inhaler Inhale 1 puff into the lungs every 6 (six) hours as  needed for wheezing or shortness of breath.   Marland Kitchen amitriptyline (ELAVIL) 10 MG tablet TAKE 1 TO 2 TABLETS BY MOUTH EVERY DAY AT BEDTIME   . atorvastatin (LIPITOR) 20 MG tablet Take 20 mg by mouth daily. 01/28/2016: taking  . Blood Glucose Monitoring Suppl (ONETOUCH VERIO REFLECT) w/Device KIT USE TO TEST BLOOD SUGAR 2 TIMES A DAY   . budesonide-formoterol (SYMBICORT) 160-4.5 MCG/ACT inhaler Inhale 2 puffs into the lungs 2 (two) times daily.   . calcium carbonate (OS-CAL - DOSED IN MG OF ELEMENTAL CALCIUM) 1250 (500 Ca) MG tablet Take 1 tablet by mouth daily with breakfast.   . ergocalciferol (VITAMIN D2) 50000 units capsule Take 50,000 Units by mouth once a week.   . fentaNYL (DURAGESIC) 12 MCG/HR Place 1 patch onto the skin every 3 (three) days.   . insulin glargine (LANTUS) 100 UNIT/ML injection Inject 24 Units into the skin at bedtime.   . Lancets (ONETOUCH DELICA PLUS [id number]) MISC USE TO TEST BLOOD SUGAR 2 TIMES A DAY   . letrozole (FEMARA) 2.5 MG tablet Take 1 tablet (2.5 mg total) by mouth daily.   Marland Kitchen loratadine (CLARITIN) 10 MG tablet Take 10 mg by mouth daily as needed for allergies.   Marland Kitchen losartan (COZAAR) 100 MG tablet Take 100 mg daily by mouth.   . meclizine (ANTIVERT) 25 MG tablet Take 25 mg by mouth 2 (two) times daily as needed.   . methocarbamol (ROBAXIN) 500 MG tablet TAKE 1 TABLET BY MOUTH EVERY 6 HOURS AS NEEDED FOR MUSCLE SPASMS.   . Multiple Vitamins-Minerals (ICAPS AREDS 2) CAPS Take by mouth.   . naloxegol oxalate (MOVANTIK) 25 MG TABS tablet Take 1 tablet (25 mg total) by mouth daily.   Glory Rosebush VERIO test strip USE TO TEST BLOOD SUGAR 2 TIMES A DAY   . oxyCODONE-acetaminophen (PERCOCET) 10-325 MG tablet Take 1 tablet by mouth every 8 (eight) hours as needed for pain.   . pregabalin (LYRICA) 100 MG capsule Take 1 capsule (100 mg total) by mouth 2 (two) times daily.   . sitaGLIPtin (JANUVIA) 100 MG tablet Take 100 mg by mouth daily.   . valsartan (DIOVAN) 160 MG tablet  Take 160 mg by mouth daily.    No facility-administered encounter medications on file as of 03/28/2019.    ALLERGIES:  Allergies  Allergen Reactions  . Other     Cat/dog dander  . Pollen Extract      PHYSICAL EXAM:  ECOG Performance status: 2  Vitals:   03/28/19 1352  BP: 133/76  Pulse: (!) 127  Resp: 18  Temp: (!) 97.5 F (36.4 C)  SpO2: 100%   Filed Weights  03/28/19 1352  Weight: 129 lb 12.8 oz (58.9 kg)    Physical Exam Constitutional:      Appearance: Normal appearance. She is normal weight.  Cardiovascular:     Rate and Rhythm: Normal rate and regular rhythm.     Heart sounds: Normal heart sounds.  Pulmonary:     Effort: Pulmonary effort is normal.     Breath sounds: Normal breath sounds.  Abdominal:     General: Bowel sounds are normal.     Palpations: Abdomen is soft.  Musculoskeletal:     Comments: Right hip pain  Skin:    General: Skin is warm.  Neurological:     Mental Status: She is alert and oriented to person, place, and time. Mental status is at baseline.  Psychiatric:        Mood and Affect: Mood normal.        Behavior: Behavior normal.        Thought Content: Thought content normal.        Judgment: Judgment normal.      LABORATORY DATA:  I have reviewed the labs as listed.  CBC    Component Value Date/Time   WBC 9.8 03/27/2019 1257   RBC 4.34 03/27/2019 1257   HGB 11.2 (L) 03/27/2019 1257   HCT 36.0 03/27/2019 1257   PLT 326 03/27/2019 1257   MCV 82.9 03/27/2019 1257   MCH 25.8 (L) 03/27/2019 1257   MCHC 31.1 03/27/2019 1257   RDW 13.5 03/27/2019 1257   LYMPHSABS 2.0 03/27/2019 1257   MONOABS 0.8 03/27/2019 1257   EOSABS 0.0 03/27/2019 1257   BASOSABS 0.1 03/27/2019 1257   CMP Latest Ref Rng & Units 03/27/2019 09/19/2018 03/26/2018  Glucose 70 - 99 mg/dL 209(H) 105(H) 89  BUN 6 - 20 mg/dL _0 Creatinine 0.44 - 1.00 mg/dL 0.89 0.80 0.69  Sodium 135 - 145 mmol/L 134(L) 140 137  Potassium 3.5 - 5.1 mmol/L 3.9 3.9 3.8   Chloride 98 - 111 mmol/L 95(L) 103 101  CO2 22 - 32 mmol/L _1 Calcium 8.9 - 10.3 mg/dL 9.9 9.9 9.7  Total Protein 6.5 - 8.1 g/dL 8.7(H) 7.7 7.5  Total Bilirubin 0.3 - 1.2 mg/dL 0.5 0.6 0.3  Alkaline Phos 38 - 126 U/L 75 49 53  AST 15 - 41 U/L 36 17 20  ALT 0 - 44 U/L _2 DIAGNOSTIC IMAGING:  I have independently reviewed the mammogram scans and discussed with the patient.  I personally performed a face-to-face visit.  All questions were answered to patient's stated satisfaction. Encouraged patient to call with any new concerns or questions before his next visit to the cancer center and we can certain see him sooner, if needed.     ASSESSMENT & PLAN:   Breast cancer of upper-outer quadrant of left female breast ([place]) 1.  Left breast cancer: - She was diagnosed with breast cancer originally in 2008.  ER+, PR+, HER-2+ -She reportedly completed 1 year of Herceptin. - She took tamoxifen for 5 years.  She was previously followed by Dr. Talbert Cage. - She completed 5 years of Femara in 2018.  She is planning to stay on Femara for additional 5 years which will be complete 2023. -Last mammogram done on 03/14/2018 was BI-RADS Category 1 negative. -Patient reports she does have mild lymphedema in her left arm.  She uses her machine twice daily and wears a sleeve both which help. - Physical assessment today did  not reveal any palpable masses or adenopathy. -Last mammogram done on 03/27/2019 showed BI-RADS Category 0 incomplete.  Showed possible right breast mass.  She will follow up with an ultrasound time possible biopsy. -Labs done on 03/27/2019 showed potassium 3.9, creatinine 0.89, LDH 193, WBC 9.8, hemoglobin 11.2, platelets 326. -She will make a follow-up appointment after her ultrasound and possible biopsy if needed. -Otherwise we will see her in 6 months with repeat labs.  2.  Back pain: -Patient has chronic back pain. -She had an MRI of the spine done on 10/17/2014 that showed  spinal stenosis. -She had a bone scan done on 05/10/2017 showed question of compression fractures. - She underwent a thoracic x-ray as well as sternal x-rays on 09/20/2017 that showed DJD and osteopenia. -She is being followed by pain management and may need an orthopedic evaluation if symptoms ongoing. - Patient was previously followed by Dr. Walden Field.  She did an SPEP which was negative.  3.  Osteopenia: -Her bone density test done on 03/27/2019 showed a T score of -1.0 osteopenia. -She is being treated with Prolia every 6 months. - She will continue taking calcium and vitamin D daily. -She will get her Prolia injection today.       Orders placed this encounter:  Orders Placed This Encounter  Procedures  . Lactate dehydrogenase  . CBC with Differential/Platelet  . Comprehensive metabolic panel  . Vitamin B12  . VITAMIN D 25 Hydroxy (Vit-D Deficiency, Fractures)      Francene Finders, FNP-C [place] [phone]

## 2019-04-08 ENCOUNTER — Encounter: Payer: Self-pay | Admitting: Registered Nurse

## 2019-04-08 ENCOUNTER — Encounter: Payer: BC Managed Care – PPO | Attending: Physical Medicine & Rehabilitation | Admitting: Registered Nurse

## 2019-04-08 ENCOUNTER — Other Ambulatory Visit: Payer: Self-pay

## 2019-04-08 VITALS — BP 132/89 | HR 100 | Temp 97.5°F | Ht 63.0 in | Wt 133.0 lb

## 2019-04-08 DIAGNOSIS — M7061 Trochanteric bursitis, right hip: Secondary | ICD-10-CM

## 2019-04-08 DIAGNOSIS — I890 Lymphedema, not elsewhere classified: Secondary | ICD-10-CM | POA: Insufficient documentation

## 2019-04-08 DIAGNOSIS — G8929 Other chronic pain: Secondary | ICD-10-CM | POA: Diagnosis not present

## 2019-04-08 DIAGNOSIS — M546 Pain in thoracic spine: Secondary | ICD-10-CM

## 2019-04-08 DIAGNOSIS — M545 Low back pain: Secondary | ICD-10-CM | POA: Diagnosis not present

## 2019-04-08 DIAGNOSIS — Z9221 Personal history of antineoplastic chemotherapy: Secondary | ICD-10-CM | POA: Insufficient documentation

## 2019-04-08 DIAGNOSIS — G894 Chronic pain syndrome: Secondary | ICD-10-CM

## 2019-04-08 DIAGNOSIS — Z79891 Long term (current) use of opiate analgesic: Secondary | ICD-10-CM | POA: Diagnosis not present

## 2019-04-08 DIAGNOSIS — M62838 Other muscle spasm: Secondary | ICD-10-CM | POA: Diagnosis not present

## 2019-04-08 DIAGNOSIS — G620 Drug-induced polyneuropathy: Secondary | ICD-10-CM | POA: Diagnosis not present

## 2019-04-08 DIAGNOSIS — Z853 Personal history of malignant neoplasm of breast: Secondary | ICD-10-CM | POA: Insufficient documentation

## 2019-04-08 DIAGNOSIS — Z5181 Encounter for therapeutic drug level monitoring: Secondary | ICD-10-CM

## 2019-04-08 DIAGNOSIS — T451X5A Adverse effect of antineoplastic and immunosuppressive drugs, initial encounter: Secondary | ICD-10-CM | POA: Diagnosis not present

## 2019-04-08 DIAGNOSIS — M47816 Spondylosis without myelopathy or radiculopathy, lumbar region: Secondary | ICD-10-CM

## 2019-04-08 DIAGNOSIS — M5416 Radiculopathy, lumbar region: Secondary | ICD-10-CM

## 2019-04-08 MED ORDER — OXYCODONE-ACETAMINOPHEN 10-325 MG PO TABS: 1.0000 | ORAL_TABLET | Freq: Three times a day (TID) | ORAL | 0 refills | Status: DC | PRN

## 2019-04-08 MED ORDER — AMITRIPTYLINE HCL 10 MG PO TABS: ORAL_TABLET | ORAL | 2 refills | Status: DC

## 2019-04-08 MED ORDER — FENTANYL 12 MCG/HR TD PT72: 1.0000 | MEDICATED_PATCH | TRANSDERMAL | 0 refills | Status: DC

## 2019-04-08 NOTE — Progress Notes (Signed)
Subjective:    Patient ID: Krista Doyle, female    DOB: [birth year], 59 y.o.   MRN: [id number]  HPI: Krista Doyle is a 59 y.o. female who returns for follow up appointment for chronic pain and medication refill. She states her pain is located in her mid- lower back radiating into her right lower extremity and right hip. She rates her pain 5. Her current exercise regime is walking and performing stretching exercises.  Krista Doyle Morphine equivalent is 73.80 MME.  Oral Swab was Performed today.   Pain Inventory Average Pain 6 Pain Right Now 5 My pain is intermittent and sharp  In the last 24 hours, has pain interfered with the following? General activity 8 Relation with others 7 Enjoyment of life 7 What TIME of day is your pain at its worst? evening and night Sleep (in general) Fair  Pain is worse with: walking, bending, sitting and standing Pain improves with: rest, heat/ice, therapy/exercise, pacing activities, medication and TENS Relief from Meds: 8  Mobility walk with assistance use a cane use a walker how many minutes can you walk? 10-15 ability to climb steps?  yes do you drive?  yes  Function disabled: date disabled .  Neuro/Psych numbness tingling trouble walking spasms  Prior Studies bone scan  Physicians involved in your care Any changes since last visit?  no   Family History  Problem Relation Age of Onset  . Colon cancer Neg Hx    Social History   Socioeconomic History  . Marital status: Married    Spouse name: Not on file  . Number of children: Not on file  . Years of education: Not on file  . Highest education level: Not on file  Occupational History  . Occupation: Disabled  Tobacco Use  . Smoking status: Never Smoker  . Smokeless tobacco: Never Used  Substance and Sexual Activity  . Alcohol use: No    Alcohol/week: 0.0 standard drinks  . Drug use: No  . Sexual activity: Yes  Other Topics Concern  . Not on file   Social History Narrative  . Not on file   Social Determinants of Health   Financial Resource Strain:   . Difficulty of Paying Living Expenses: Not on file  Food Insecurity:   . Worried About Charity fundraiser in the Last Year: Not on file  . Ran Out of Food in the Last Year: Not on file  Transportation Needs:   . Lack of Transportation (Medical): Not on file  . Lack of Transportation (Non-Medical): Not on file  Physical Activity:   . Days of Exercise per Week: Not on file  . Minutes of Exercise per Session: Not on file  Stress:   . Feeling of Stress : Not on file  Social Connections:   . Frequency of Communication with Friends and Family: Not on file  . Frequency of Social Gatherings with Friends and Family: Not on file  . Attends Religious Services: Not on file  . Active Member of Clubs or Organizations: Not on file  . Attends Archivist Meetings: Not on file  . Marital Status: Not on file   Past Surgical History:  Procedure Laterality Date  . BACK SURGERY    . BREAST LUMPECTOMY    . COLONOSCOPY     about 3 yrs ago in [place]  . KNEE SURGERY     right  . TONSILLECTOMY     Past Medical History:  Diagnosis Date  .  Asthma   . Back pain   . Breast cancer ([place]) 2008   left  . Diabetes ([place])   . Hypercholesterolemia   . Hypertension   . 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 Personal history of chemotherapy   . Personal history of radiation therapy 2008   BP 132/89   Pulse 100   Temp (!) 97.5 F (36.4 C)   Ht 5\' 3"  (1.6 m)   Wt 133 lb (60.3 kg)   BMI 23.56 kg/m   Opioid Risk Score:   Fall Risk Score:  `1  Depression screen PHQ 2/9  Depression screen [facility] 2/9 01/03/2019 12/11/2017 11/07/2017 04/21/2017 01/13/2017 05/27/2016 03/28/2016  Decreased Interest 0 0 0 - 0 0 0  Down, Depressed, Hopeless 0 0 0 - 0 0 0  PHQ - 2 Score 0 0 0 - 0 0 0  Altered sleeping - - - 0 - - -  Tired, decreased energy - - - 0 - - -  Change in appetite - - - - - - -   Feeling bad or failure about yourself  - - - - - - -  Trouble concentrating - - - - - - -  Moving slowly or fidgety/restless - - - - - - -  Suicidal thoughts - - - - - - -  PHQ-9 Score - - - - - - -  Difficult doing work/chores - - - - - - -    Review of Systems  Neurological: Positive for numbness.       Tingling and spasms  All other systems reviewed and are negative.      Objective:   Physical Exam Vitals and nursing note reviewed.  Constitutional:      Appearance: Normal appearance.  Cardiovascular:     Rate and Rhythm: Normal rate and regular rhythm.     Pulses: Normal pulses.     Heart sounds: Normal heart sounds.  Pulmonary:     Effort: Pulmonary effort is normal.     Breath sounds: Normal breath sounds.  Musculoskeletal:     Cervical back: Normal range of motion and neck supple.     Comments: Normal Muscle Bulk and Muscle Testing Reveals:  Upper Extremities: Right: Full ROM and Muscle Strength 4/5 Left: Decreased ROM 45 Degrees and Muscle Strength 3/5 Right AC Joint Tenderness  Thoracic Hypersensitivity: T-1-T-7 Lumbar Hypersensitivity Lower Extremities: Right: Decreased ROM and Muscle Strength 4/5 Right Lower Extremity Flexion Produces Pain into her Lumbar and Right Lower Extremity Left: Full ROM and Muscle Strength 5/5 Arises from Table Slowly using cane for support Narrow Based  Gait   Skin:    General: Skin is warm and dry.  Neurological:     Mental Status: She is alert and oriented to person, place, and time.  Psychiatric:        Mood and Affect: Mood normal.        Behavior: Behavior normal.           Assessment & Plan:  1. Chronic low back pain, Lumbar Facet Arthropathy,lumbar spondylosis, spondylolisthesis, with lumbar radiculopathy.Continuecurrentmedication regimen:04/08/2019 Refilled: Fentanyl 12 mcg one patch every three days #10 and Oxycodone 10/325 mg one tablet every 8 hours as needed for pain #90.  We will continue the opioid  monitoring program, this consists of regular clinic visits, examinations, urine drug screen, pill counts as well as use of [place] Controlled Substance reporting System. 2. Chemotherapy induced peripheral neuropathy:ContinueLyrica.04/08/2019 3. Chronic Midline Thoracic Pain: Continue  HEP and Current Medication Regime.04/08/2019 4. Muscle Spasm: Continuemedication regimen withRobaxin03/09/2019 5. Right Knee Pain:No complaints today.Continue Current Medication Regime: Continue HEP as ToleratedandContinue to Monitor.04/08/2019 6. Right Greater Trochanteric Bursitis: Continue to Alternate Ice and Heat Therapy.Continue to Monitor.04/08/2019 7. Chronic Pain Syndrome: ContinueAmitriptyline, Fentanyly and Oxycodone. Continue to Monitor.04/08/2019  F/U in 1 month   53minutes of face to face patient care time was spent during this visit. All questions were encouraged and answered.

## 2019-04-09 ENCOUNTER — Ambulatory Visit (HOSPITAL_COMMUNITY)
Admission: RE | Admit: 2019-04-09 | Discharge: 2019-04-09 | Disposition: A | Discharge status: Home / Self Care | Payer: BC Managed Care – PPO | Source: Ambulatory Visit | Attending: Nurse Practitioner | Admitting: Nurse Practitioner

## 2019-04-09 DIAGNOSIS — R928 Other abnormal and inconclusive findings on diagnostic imaging of breast: Secondary | ICD-10-CM

## 2019-04-09 DIAGNOSIS — N6489 Other specified disorders of breast: Secondary | ICD-10-CM | POA: Diagnosis not present

## 2019-04-09 DIAGNOSIS — Z853 Personal history of malignant neoplasm of breast: Secondary | ICD-10-CM | POA: Diagnosis not present

## 2019-04-10 LAB — DRUG TOX ALC METAB W/CON, ORAL FLD: Alcohol Metabolite: NEGATIVE ng/mL (ref <25)

## 2019-04-11 LAB — DRUG TOX MONITOR 1 W/CONF, ORAL FLD
Amphetamines: NEGATIVE ng/mL (ref <10)
Barbiturates: NEGATIVE ng/mL (ref <10)
Benzodiazepines: NEGATIVE ng/mL (ref <0.50)
Buprenorphine: NEGATIVE ng/mL (ref <0.10)
Cocaine: NEGATIVE ng/mL (ref <5.0)
Codeine: NEGATIVE ng/mL (ref <2.5)
Dihydrocodeine: NEGATIVE ng/mL (ref <2.5)
Fentanyl: 2.18 ng/mL — ABNORMAL HIGH (ref <0.10)
Fentanyl: POSITIVE ng/mL — AB (ref <0.10)
Heroin Metabolite: NEGATIVE ng/mL (ref <1.0)
Hydrocodone: NEGATIVE ng/mL (ref <2.5)
Hydromorphone: NEGATIVE ng/mL (ref <2.5)
MARIJUANA: NEGATIVE ng/mL (ref <2.5)
MDMA: NEGATIVE ng/mL (ref <10)
Meprobamate: NEGATIVE ng/mL (ref <2.5)
Methadone: NEGATIVE ng/mL (ref <5.0)
Morphine: NEGATIVE ng/mL (ref <2.5)
Nicotine Metabolite: NEGATIVE ng/mL (ref <5.0)
Norhydrocodone: NEGATIVE ng/mL (ref <2.5)
Noroxycodone: 5.1 ng/mL — ABNORMAL HIGH (ref <2.5)
Opiates: POSITIVE ng/mL — AB (ref <2.5)
Oxycodone: 56.4 ng/mL — ABNORMAL HIGH (ref <2.5)
Oxymorphone: NEGATIVE ng/mL (ref <2.5)
Phencyclidine: NEGATIVE ng/mL (ref <10)
Tapentadol: NEGATIVE ng/mL (ref <5.0)
Tramadol: NEGATIVE ng/mL (ref <5.0)
Zolpidem: NEGATIVE ng/mL (ref <5.0)

## 2019-04-15 ENCOUNTER — Telehealth: Payer: Self-pay

## 2019-04-15 NOTE — Telephone Encounter (Signed)
UDS CONSISTENT WITH MEDICATIONS ON FILE

## 2019-04-16 ENCOUNTER — Encounter (HOSPITAL_COMMUNITY): Payer: BC Managed Care – PPO

## 2019-05-07 ENCOUNTER — Other Ambulatory Visit: Payer: Self-pay

## 2019-05-07 ENCOUNTER — Ambulatory Visit (HOSPITAL_COMMUNITY)
Admission: RE | Admit: 2019-05-07 | Discharge: 2019-05-07 | Disposition: A | Discharge status: Home / Self Care | Payer: BC Managed Care – PPO | Source: Ambulatory Visit | Attending: Internal Medicine | Admitting: Internal Medicine

## 2019-05-07 ENCOUNTER — Other Ambulatory Visit (HOSPITAL_COMMUNITY): Payer: Self-pay | Admitting: Internal Medicine

## 2019-05-07 DIAGNOSIS — J189 Pneumonia, unspecified organism: Secondary | ICD-10-CM | POA: Insufficient documentation

## 2019-05-07 DIAGNOSIS — I10 Essential (primary) hypertension: Secondary | ICD-10-CM | POA: Diagnosis not present

## 2019-05-07 DIAGNOSIS — R079 Chest pain, unspecified: Secondary | ICD-10-CM | POA: Diagnosis not present

## 2019-05-07 DIAGNOSIS — E119 Type 2 diabetes mellitus without complications: Secondary | ICD-10-CM | POA: Diagnosis not present

## 2019-05-09 ENCOUNTER — Encounter: Payer: BC Managed Care – PPO | Attending: Physical Medicine & Rehabilitation | Admitting: Registered Nurse

## 2019-05-09 ENCOUNTER — Encounter: Payer: Self-pay | Admitting: Registered Nurse

## 2019-05-09 ENCOUNTER — Other Ambulatory Visit: Payer: Self-pay

## 2019-05-09 VITALS — BP 131/84 | HR 87 | Temp 98.7°F | Ht 63.0 in | Wt 136.2 lb

## 2019-05-09 DIAGNOSIS — M546 Pain in thoracic spine: Secondary | ICD-10-CM | POA: Diagnosis not present

## 2019-05-09 DIAGNOSIS — T451X5A Adverse effect of antineoplastic and immunosuppressive drugs, initial encounter: Secondary | ICD-10-CM | POA: Insufficient documentation

## 2019-05-09 DIAGNOSIS — Z79891 Long term (current) use of opiate analgesic: Secondary | ICD-10-CM

## 2019-05-09 DIAGNOSIS — Z9221 Personal history of antineoplastic chemotherapy: Secondary | ICD-10-CM | POA: Insufficient documentation

## 2019-05-09 DIAGNOSIS — Z5181 Encounter for therapeutic drug level monitoring: Secondary | ICD-10-CM | POA: Diagnosis not present

## 2019-05-09 DIAGNOSIS — M5416 Radiculopathy, lumbar region: Secondary | ICD-10-CM | POA: Diagnosis not present

## 2019-05-09 DIAGNOSIS — M62838 Other muscle spasm: Secondary | ICD-10-CM | POA: Diagnosis not present

## 2019-05-09 DIAGNOSIS — I890 Lymphedema, not elsewhere classified: Secondary | ICD-10-CM | POA: Diagnosis not present

## 2019-05-09 DIAGNOSIS — Z853 Personal history of malignant neoplasm of breast: Secondary | ICD-10-CM | POA: Insufficient documentation

## 2019-05-09 DIAGNOSIS — G8929 Other chronic pain: Secondary | ICD-10-CM | POA: Insufficient documentation

## 2019-05-09 DIAGNOSIS — M47816 Spondylosis without myelopathy or radiculopathy, lumbar region: Secondary | ICD-10-CM | POA: Diagnosis not present

## 2019-05-09 DIAGNOSIS — G620 Drug-induced polyneuropathy: Secondary | ICD-10-CM | POA: Diagnosis not present

## 2019-05-09 DIAGNOSIS — M545 Low back pain: Secondary | ICD-10-CM | POA: Insufficient documentation

## 2019-05-09 DIAGNOSIS — G894 Chronic pain syndrome: Secondary | ICD-10-CM

## 2019-05-09 MED ORDER — FENTANYL 12 MCG/HR TD PT72: 1.0000 | MEDICATED_PATCH | TRANSDERMAL | 0 refills | Status: DC

## 2019-05-09 MED ORDER — OXYCODONE-ACETAMINOPHEN 10-325 MG PO TABS: 1.0000 | ORAL_TABLET | Freq: Three times a day (TID) | ORAL | 0 refills | Status: DC | PRN

## 2019-05-09 NOTE — Progress Notes (Signed)
Subjective:    Patient ID: Krista Doyle, female    DOB: 11/19/1960, 59 y.o.   MRN: [id number]  HPI: Krista Doyle is a 59 y.o. female who returns for follow up appointment for chronic pain and medication refill. She states her pain is located in her mid- lower back pain radiating into her right lower extremity. She rates her pain 8. Her current exercise regime is walking and performing stretching exercises.  Krista Doyle Morphine equivalent is 73.80  MME.    Last Oral Swab was Performed on 04/08/2019, it was consistent.    Pain Inventory Average Pain 7 Pain Right Now 8 My pain is aching  In the last 24 hours, has pain interfered with the following? General activity 7 Relation with others 8 Enjoyment of life 8 What TIME of day is your pain at its worst? evening and night Sleep (in general) Fair  Pain is worse with: walking, bending, sitting and standing Pain improves with: rest, heat/ice, medication and TENS Relief from Meds: 8  Mobility use a cane use a walker how many minutes can you walk? 5-10 ability to climb steps?  yes do you drive?  yes  Function disabled: date disabled . I need assistance with the following:  household duties  Neuro/Psych No problems in this area  Prior Studies x-rays  Physicians involved in your care Any changes since last visit?  no   Family History  Problem Relation Age of Onset  . Colon cancer Neg Hx    Social History   Socioeconomic History  . Marital status: Married    Spouse name: Not on file  . Number of children: Not on file  . Years of education: Not on file  . Highest education level: Not on file  Occupational History  . Occupation: Disabled  Tobacco Use  . Smoking status: Never Smoker  . Smokeless tobacco: Never Used  Substance and Sexual Activity  . Alcohol use: No    Alcohol/week: 0.0 standard drinks  . Drug use: No  . Sexual activity: Yes  Other Topics Concern  . Not on file  Social History Narrative  .  Not on file   Social Determinants of Health   Financial Resource Strain:   . Difficulty of Paying Living Expenses:   Food Insecurity:   . Worried About Charity fundraiser in the Last Year:   . Arboriculturist in the Last Year:   Transportation Needs:   . Film/video editor (Medical):   Marland Kitchen Lack of Transportation (Non-Medical):   Physical Activity:   . Days of Exercise per Week:   . Minutes of Exercise per Session:   Stress:   . Feeling of Stress :   Social Connections:   . Frequency of Communication with Friends and Family:   . Frequency of Social Gatherings with Friends and Family:   . Attends Religious Services:   . Active Member of Clubs or Organizations:   . Attends Archivist Meetings:   Marland Kitchen Marital Status:    Past Surgical History:  Procedure Laterality Date  . BACK SURGERY    . BREAST LUMPECTOMY    . COLONOSCOPY     about 3 yrs ago in [place]  . KNEE SURGERY     right  . TONSILLECTOMY     Past Medical History:  Diagnosis Date  . Asthma   . Back pain   . Breast cancer ([place]) 2008   left  . Diabetes ([place])   .  Hypercholesterolemia   . Hypertension   . 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 Personal history of chemotherapy   . Personal history of radiation therapy 2008   BP 131/84   Pulse 87   Temp 98.7 F (37.1 C)   Ht 5\' 3"  (1.6 m)   Wt 136 lb 3.2 oz (61.8 kg)   BMI 24.13 kg/m   Opioid Risk Score:   Fall Risk Score:  `1  Depression screen PHQ 2/9  Depression screen [facility] 2/9 01/03/2019 12/11/2017 11/07/2017 04/21/2017 01/13/2017 05/27/2016 03/28/2016  Decreased Interest 0 0 0 - 0 0 0  Down, Depressed, Hopeless 0 0 0 - 0 0 0  PHQ - 2 Score 0 0 0 - 0 0 0  Altered sleeping - - - 0 - - -  Tired, decreased energy - - - 0 - - -  Change in appetite - - - - - - -  Feeling bad or failure about yourself  - - - - - - -  Trouble concentrating - - - - - - -  Moving slowly or fidgety/restless - - - - - - -  Suicidal thoughts - - - - - - -    PHQ-9 Score - - - - - - -  Difficult doing work/chores - - - - - - -   Review of Systems  All other systems reviewed and are negative.      Objective:   Physical Exam Vitals and nursing note reviewed.  Constitutional:      Appearance: Normal appearance.  Cardiovascular:     Rate and Rhythm: Normal rate and regular rhythm.     Pulses: Normal pulses.     Heart sounds: Normal heart sounds.  Pulmonary:     Effort: Pulmonary effort is normal.     Breath sounds: Normal breath sounds.  Musculoskeletal:     Cervical back: Normal range of motion and neck supple.     Comments: Normal Muscle Bulk and Muscle Testing Reveals:  Upper Extremities: Right: Decreased ROM 45 Degrees and Muscle Strength 3/5  Left: Decreased ROM 30 Degrees and Muscle Strength 4/5 Left AC Joint Tenderness  Thoracic Hypersensitivity: T-7-T-9  Lumbar Hypersensitivity Lower Extremities: Right: Decreased ROM and Muscle Strength 4/5 Right Lower Extremity Flexion Produces Pain into her Right Lower Extremity and Lower Back Left: Full ROM and Muscle Strength 5/5 Arises from Table slowly using cane for support Antalgic Gait   Skin:    General: Skin is warm and dry.  Neurological:     Mental Status: She is alert and oriented to person, place, and time.  Psychiatric:        Mood and Affect: Mood normal.        Behavior: Behavior normal.           Assessment & Plan:  1. Chronic low back pain, Lumbar Facet Arthropathy,lumbar spondylosis, spondylolisthesis, with lumbar radiculopathy.Continuecurrentmedication regimen:05/09/2019 Refilled: Fentanyl 12 mcg one patch every three days #10 and Oxycodone 10/325 mg one tablet every 8 hours as needed for pain #90.  We will continue the opioid monitoring program, this consists of regular clinic visits, examinations, urine drug screen, pill counts as well as use of [place] Controlled Substance reporting System. 2. Chemotherapy induced peripheral  neuropathy:ContinueLyrica.05/09/2019 3. Chronic Midline Thoracic Pain: Continue HEP and Current Medication Regime.05/09/2019 4. Muscle Spasm: Continuemedication regimen withRobaxin04/09/2019 5. Right Knee Pain:No complaints today.Continue Current Medication Regime: Continue HEP as ToleratedandContinue to Monitor.05/09/2019 6. Right Greater Trochanteric Bursitis: No complaints  Today. Continue to Alternate Ice and Heat Therapy.Continue to Monitor.05/09/2019 7. Chronic Pain Syndrome: ContinueAmitriptyline, Fentanyly and Oxycodone. Continue to Monitor.05/09/2019  F/U in 1 month   54minutes of face to face patient care time was spent during this visit. All questions were encouraged and answered.

## 2019-06-04 ENCOUNTER — Other Ambulatory Visit: Payer: Self-pay

## 2019-06-04 ENCOUNTER — Encounter: Payer: Self-pay | Admitting: Registered Nurse

## 2019-06-04 ENCOUNTER — Encounter: Payer: BC Managed Care – PPO | Attending: Physical Medicine & Rehabilitation | Admitting: Registered Nurse

## 2019-06-04 VITALS — BP 117/78 | HR 95 | Temp 97.7°F | Ht 63.0 in | Wt 135.0 lb

## 2019-06-04 DIAGNOSIS — M62838 Other muscle spasm: Secondary | ICD-10-CM | POA: Diagnosis not present

## 2019-06-04 DIAGNOSIS — M5416 Radiculopathy, lumbar region: Secondary | ICD-10-CM

## 2019-06-04 DIAGNOSIS — M545 Low back pain: Secondary | ICD-10-CM | POA: Insufficient documentation

## 2019-06-04 DIAGNOSIS — M7061 Trochanteric bursitis, right hip: Secondary | ICD-10-CM

## 2019-06-04 DIAGNOSIS — T451X5A Adverse effect of antineoplastic and immunosuppressive drugs, initial encounter: Secondary | ICD-10-CM

## 2019-06-04 DIAGNOSIS — M47816 Spondylosis without myelopathy or radiculopathy, lumbar region: Secondary | ICD-10-CM | POA: Diagnosis not present

## 2019-06-04 DIAGNOSIS — M546 Pain in thoracic spine: Secondary | ICD-10-CM | POA: Diagnosis not present

## 2019-06-04 DIAGNOSIS — Z853 Personal history of malignant neoplasm of breast: Secondary | ICD-10-CM | POA: Diagnosis not present

## 2019-06-04 DIAGNOSIS — G894 Chronic pain syndrome: Secondary | ICD-10-CM | POA: Diagnosis not present

## 2019-06-04 DIAGNOSIS — Z79891 Long term (current) use of opiate analgesic: Secondary | ICD-10-CM | POA: Diagnosis not present

## 2019-06-04 DIAGNOSIS — G8929 Other chronic pain: Secondary | ICD-10-CM | POA: Diagnosis not present

## 2019-06-04 DIAGNOSIS — Z5181 Encounter for therapeutic drug level monitoring: Secondary | ICD-10-CM | POA: Diagnosis not present

## 2019-06-04 DIAGNOSIS — I890 Lymphedema, not elsewhere classified: Secondary | ICD-10-CM | POA: Insufficient documentation

## 2019-06-04 DIAGNOSIS — G620 Drug-induced polyneuropathy: Secondary | ICD-10-CM

## 2019-06-04 DIAGNOSIS — Z9221 Personal history of antineoplastic chemotherapy: Secondary | ICD-10-CM | POA: Diagnosis not present

## 2019-06-04 MED ORDER — FENTANYL 12 MCG/HR TD PT72: 1.0000 | MEDICATED_PATCH | TRANSDERMAL | 0 refills | Status: DC

## 2019-06-04 MED ORDER — METHOCARBAMOL 500 MG PO TABS: ORAL_TABLET | ORAL | 2 refills | Status: DC

## 2019-06-04 MED ORDER — PREGABALIN 100 MG PO CAPS: 100.0000 mg | ORAL_CAPSULE | Freq: Two times a day (BID) | ORAL | 3 refills | Status: DC

## 2019-06-04 MED ORDER — OXYCODONE-ACETAMINOPHEN 10-325 MG PO TABS: 1.0000 | ORAL_TABLET | Freq: Three times a day (TID) | ORAL | 0 refills | Status: DC | PRN

## 2019-06-04 NOTE — Progress Notes (Signed)
Subjective:    Patient ID: Krista Doyle, female    DOB: 1960/04/25, 59 y.o.   MRN: [id number]  HPI: Krista Doyle is a 59 y.o. female who returns for follow up appointment for chronic pain and medication refill. She states her pain is located in her mid- lower back radiating into her right lower extremity. Also reports right hip pain. She rates her pain 6. Her current exercise regime is walking and performing stretching exercises.  Krista Doyle Morphine equivalent is 73.80 MME.  Last Oral Swab was Performed on 04/08/2019, it was consistent.    Pain Inventory Average Pain 7 Pain Right Now 6 My pain is constant  In the last 24 hours, has pain interfered with the following? General activity 7 Relation with others 7 Enjoyment of life 8 What TIME of day is your pain at its worst? evening Sleep (in general) Fair  Pain is worse with: walking, bending, sitting and standing Pain improves with: rest, heat/ice, pacing activities and medication Relief from Meds: 7  Mobility walk with assistance use a cane use a walker ability to climb steps?  yes do you drive?  yes Do you have any goals in this area?  yes  Function disabled: date disabled . I need assistance with the following:  meal prep, household duties and shopping  Neuro/Psych weakness  Prior Studies Any changes since last visit?  no  Physicians involved in your care Any changes since last visit?  no   Family History  Problem Relation Age of Onset  . Colon cancer Neg Hx    Social History   Socioeconomic History  . Marital status: Married    Spouse name: Not on file  . Number of children: Not on file  . Years of education: Not on file  . Highest education level: Not on file  Occupational History  . Occupation: Disabled  Tobacco Use  . Smoking status: Never Smoker  . Smokeless tobacco: Never Used  Substance and Sexual Activity  . Alcohol use: No    Alcohol/week: 0.0 standard drinks  . Drug use: No    . Sexual activity: Yes  Other Topics Concern  . Not on file  Social History Narrative  . Not on file   Social Determinants of Health   Financial Resource Strain:   . Difficulty of Paying Living Expenses:   Food Insecurity:   . Worried About Charity fundraiser in the Last Year:   . Arboriculturist in the Last Year:   Transportation Needs:   . Film/video editor (Medical):   Marland Kitchen Lack of Transportation (Non-Medical):   Physical Activity:   . Days of Exercise per Week:   . Minutes of Exercise per Session:   Stress:   . Feeling of Stress :   Social Connections:   . Frequency of Communication with Friends and Family:   . Frequency of Social Gatherings with Friends and Family:   . Attends Religious Services:   . Active Member of Clubs or Organizations:   . Attends Archivist Meetings:   Marland Kitchen Marital Status:    Past Surgical History:  Procedure Laterality Date  . BACK SURGERY    . BREAST LUMPECTOMY    . COLONOSCOPY     about 3 yrs ago in [place]  . KNEE SURGERY     right  . TONSILLECTOMY     Past Medical History:  Diagnosis Date  . Asthma   . Back pain   .  Breast cancer ([place]) 2008   left  . Diabetes ([place])   . Hypercholesterolemia   . Hypertension   . 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 Personal history of chemotherapy   . Personal history of radiation therapy 2008   BP 117/78   Pulse 95   Temp 97.7 F (36.5 C)   Ht 5\' 3"  (1.6 m)   Wt 135 lb (61.2 kg)   SpO2 98%   BMI 23.91 kg/m   Opioid Risk Score:   Fall Risk Score:  `1  Depression screen PHQ 2/9  Depression screen [facility] 2/9 01/03/2019 12/11/2017 11/07/2017 04/21/2017 01/13/2017 05/27/2016 03/28/2016  Decreased Interest 0 0 0 - 0 0 0  Down, Depressed, Hopeless 0 0 0 - 0 0 0  PHQ - 2 Score 0 0 0 - 0 0 0  Altered sleeping - - - 0 - - -  Tired, decreased energy - - - 0 - - -  Change in appetite - - - - - - -  Feeling bad or failure about yourself  - - - - - - -  Trouble concentrating - -  - - - - -  Moving slowly or fidgety/restless - - - - - - -  Suicidal thoughts - - - - - - -  PHQ-9 Score - - - - - - -  Difficult doing work/chores - - - - - - -    Review of Systems  Constitutional: Negative.   HENT: Negative.   Eyes: Negative.   Respiratory: Negative.   Cardiovascular: Positive for leg swelling.  Gastrointestinal: Negative.   Endocrine: Negative.   Genitourinary: Negative.   Musculoskeletal: Positive for back pain.  Skin: Negative.   Allergic/Immunologic: Negative.   Neurological: Positive for weakness.  Hematological: Negative.   Psychiatric/Behavioral: Negative.   All other systems reviewed and are negative.      Objective:   Physical Exam Vitals and nursing note reviewed.  Constitutional:      Appearance: Normal appearance.  Cardiovascular:     Rate and Rhythm: Normal rate and regular rhythm.     Pulses: Normal pulses.     Heart sounds: Normal heart sounds.  Pulmonary:     Effort: Pulmonary effort is normal.     Breath sounds: Normal breath sounds.  Musculoskeletal:     Cervical back: Normal range of motion and neck supple.     Comments: Normal Muscle Bulk and Muscle Testing Reveals:  Upper Extremities: Right: Full ROM and Muscle Strength 4/5 Left: Decreased ROM 45 Degrees and Muscle Strength 3/5 Bilateral AC Joint Tenderness: R>L  Thoracic Paraspinal Tenderness: T-7-T-9 Lumbar Hypersensitivity Right: Greater trochanteric Tenderness Lower Extremities: Right: Decreased ROM and Muscle Strength 5/5 Right Lower Extremity Flexion Produces Pain into Right Lower Extremity Left: Full ROM and Muscle Strength 5/5 Arises from Table Slowly using cane for support Antalgic  Gait   Skin:    General: Skin is warm and dry.  Neurological:     Mental Status: She is alert and oriented to person, place, and time.  Psychiatric:        Mood and Affect: Mood normal.        Behavior: Behavior normal.           Assessment & Plan:  1. Chronic low back  pain, Lumbar Facet Arthropathy,lumbar spondylosis, spondylolisthesis, with lumbar radiculopathy.Continuecurrentmedication regimen:06/04/2019 Refilled: Fentanyl 12 mcg one patch every three days #10 and Oxycodone 10/325 mg one tablet every 8 hours as needed for pain #  90.  We will continue the opioid monitoring program, this consists of regular clinic visits, examinations, urine drug screen, pill counts as well as use of [place] Controlled Substance reporting System. 2. Chemotherapy induced peripheral neuropathy:ContinueLyrica.06/04/2019 3. Chronic Midline Thoracic Pain: Continue HEP and Current Medication Regime.06/04/2019 4. Muscle Spasm: Continuemedication regimen withRobaxin05/05/2019 5. Right Knee Pain:No complaints today.Continue Current Medication Regime: Continue HEP as ToleratedandContinue to Monitor.06/04/2019 6. Right Greater Trochanteric Bursitis:  Continue to Alternate Ice and Heat Therapy.Continue to Monitor.06/04/2019 7. Chronic Pain Syndrome: ContinueAmitriptyline, Fentanyly and Oxycodone. Continue to Monitor.06/04/2019  F/U in 1 month   10minutes of face to face patient care time was spent during this visit. All questions were encouraged and answered.

## 2019-06-06 ENCOUNTER — Ambulatory Visit: Payer: BC Managed Care – PPO | Admitting: Registered Nurse

## 2019-07-05 ENCOUNTER — Encounter: Payer: BC Managed Care – PPO | Attending: Physical Medicine & Rehabilitation | Admitting: Registered Nurse

## 2019-07-05 DIAGNOSIS — M25552 Pain in left hip: Secondary | ICD-10-CM | POA: Insufficient documentation

## 2019-07-05 DIAGNOSIS — Z853 Personal history of malignant neoplasm of breast: Secondary | ICD-10-CM | POA: Insufficient documentation

## 2019-07-05 DIAGNOSIS — G620 Drug-induced polyneuropathy: Secondary | ICD-10-CM | POA: Insufficient documentation

## 2019-07-05 DIAGNOSIS — Z79891 Long term (current) use of opiate analgesic: Secondary | ICD-10-CM | POA: Insufficient documentation

## 2019-07-05 DIAGNOSIS — M47816 Spondylosis without myelopathy or radiculopathy, lumbar region: Secondary | ICD-10-CM | POA: Insufficient documentation

## 2019-07-05 DIAGNOSIS — Z9221 Personal history of antineoplastic chemotherapy: Secondary | ICD-10-CM | POA: Insufficient documentation

## 2019-07-05 DIAGNOSIS — M545 Low back pain: Secondary | ICD-10-CM | POA: Insufficient documentation

## 2019-07-05 DIAGNOSIS — T451X5A Adverse effect of antineoplastic and immunosuppressive drugs, initial encounter: Secondary | ICD-10-CM | POA: Insufficient documentation

## 2019-07-05 DIAGNOSIS — G894 Chronic pain syndrome: Secondary | ICD-10-CM | POA: Insufficient documentation

## 2019-07-05 DIAGNOSIS — I890 Lymphedema, not elsewhere classified: Secondary | ICD-10-CM | POA: Insufficient documentation

## 2019-07-05 DIAGNOSIS — Z5181 Encounter for therapeutic drug level monitoring: Secondary | ICD-10-CM | POA: Insufficient documentation

## 2019-07-05 DIAGNOSIS — M62838 Other muscle spasm: Secondary | ICD-10-CM | POA: Insufficient documentation

## 2019-07-05 DIAGNOSIS — G8929 Other chronic pain: Secondary | ICD-10-CM | POA: Insufficient documentation

## 2019-07-05 DIAGNOSIS — M5416 Radiculopathy, lumbar region: Secondary | ICD-10-CM | POA: Insufficient documentation

## 2019-07-05 NOTE — Progress Notes (Deleted)
Subjective:    Patient ID: Krista Doyle, female    DOB: 1960/09/09, 59 y.o.   MRN: [id number]  HPI  Pain Inventory Average Pain 7 Pain Right Now 8 My pain is constant  In the last 24 hours, has pain interfered with the following? General activity 7 Relation with others 7 Enjoyment of life 8 What TIME of day is your pain at its worst? evening Sleep (in general) Fair  Pain is worse with: walking, bending, sitting and standing Pain improves with: rest, heat/ice, pacing activities and medication Relief from Meds: 7  Mobility use a cane use a walker ability to climb steps?  yes do you drive?  yes Do you have any goals in this area?  yes  Function disabled: date disabled . I need assistance with the following:  meal prep, household duties and shopping  Neuro/Psych weakness  Prior Studies Any changes since last visit?  no  Physicians involved in your care Any changes since last visit?  no   Family History  Problem Relation Age of Onset  . Colon cancer Neg Hx    Social History   Socioeconomic History  . Marital status: Married    Spouse name: Not on file  . Number of children: Not on file  . Years of education: Not on file  . Highest education level: Not on file  Occupational History  . Occupation: Disabled  Tobacco Use  . Smoking status: Never Smoker  . Smokeless tobacco: Never Used  Substance and Sexual Activity  . Alcohol use: No    Alcohol/week: 0.0 standard drinks  . Drug use: No  . Sexual activity: Yes  Other Topics Concern  . Not on file  Social History Narrative  . Not on file   Social Determinants of Health   Financial Resource Strain:   . Difficulty of Paying Living Expenses:   Food Insecurity:   . Worried About Charity fundraiser in the Last Year:   . Arboriculturist in the Last Year:   Transportation Needs:   . Film/video editor (Medical):   Marland Kitchen Lack of Transportation (Non-Medical):   Physical Activity:   . Days of Exercise  per Week:   . Minutes of Exercise per Session:   Stress:   . Feeling of Stress :   Social Connections:   . Frequency of Communication with Friends and Family:   . Frequency of Social Gatherings with Friends and Family:   . Attends Religious Services:   . Active Member of Clubs or Organizations:   . Attends Archivist Meetings:   Marland Kitchen Marital Status:    Past Surgical History:  Procedure Laterality Date  . BACK SURGERY    . BREAST LUMPECTOMY    . COLONOSCOPY     about 3 yrs ago in [place]  . KNEE SURGERY     right  . TONSILLECTOMY     Past Medical History:  Diagnosis Date  . Asthma   . Back pain   . Breast cancer ([place]) 2008   left  . Diabetes ([place])   . Hypercholesterolemia   . Hypertension   . 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 Personal history of chemotherapy   . Personal history of radiation therapy 2008   There were no vitals taken for this visit.  Opioid Risk Score:   Fall Risk Score:  `1  Depression screen [facility] 2/9  Depression screen [facility] 2/9 01/03/2019 12/11/2017 11/07/2017 04/21/2017 01/13/2017 05/27/2016  03/28/2016  Decreased Interest 0 0 0 - 0 0 0  Down, Depressed, Hopeless 0 0 0 - 0 0 0  PHQ - 2 Score 0 0 0 - 0 0 0  Altered sleeping - - - 0 - - -  Tired, decreased energy - - - 0 - - -  Change in appetite - - - - - - -  Feeling bad or failure about yourself  - - - - - - -  Trouble concentrating - - - - - - -  Moving slowly or fidgety/restless - - - - - - -  Suicidal thoughts - - - - - - -  PHQ-9 Score - - - - - - -  Difficult doing work/chores - - - - - - -    Review of Systems  Musculoskeletal: Positive for gait problem.  Neurological: Positive for weakness.  All other systems reviewed and are negative.      Objective:   Physical Exam        Assessment & Plan:

## 2019-07-09 ENCOUNTER — Encounter (HOSPITAL_BASED_OUTPATIENT_CLINIC_OR_DEPARTMENT_OTHER): Payer: BC Managed Care – PPO | Admitting: Registered Nurse

## 2019-07-09 ENCOUNTER — Encounter: Payer: Self-pay | Admitting: Registered Nurse

## 2019-07-09 ENCOUNTER — Other Ambulatory Visit: Payer: Self-pay

## 2019-07-09 VITALS — BP 132/87 | HR 85 | Ht 64.0 in | Wt 129.0 lb

## 2019-07-09 DIAGNOSIS — T451X5A Adverse effect of antineoplastic and immunosuppressive drugs, initial encounter: Secondary | ICD-10-CM

## 2019-07-09 DIAGNOSIS — G8929 Other chronic pain: Secondary | ICD-10-CM

## 2019-07-09 DIAGNOSIS — Z9221 Personal history of antineoplastic chemotherapy: Secondary | ICD-10-CM | POA: Diagnosis not present

## 2019-07-09 DIAGNOSIS — G620 Drug-induced polyneuropathy: Secondary | ICD-10-CM | POA: Diagnosis not present

## 2019-07-09 DIAGNOSIS — Z79891 Long term (current) use of opiate analgesic: Secondary | ICD-10-CM

## 2019-07-09 DIAGNOSIS — Z5181 Encounter for therapeutic drug level monitoring: Secondary | ICD-10-CM

## 2019-07-09 DIAGNOSIS — M545 Low back pain: Secondary | ICD-10-CM | POA: Diagnosis not present

## 2019-07-09 DIAGNOSIS — M25552 Pain in left hip: Secondary | ICD-10-CM

## 2019-07-09 DIAGNOSIS — E1165 Type 2 diabetes mellitus with hyperglycemia: Secondary | ICD-10-CM | POA: Diagnosis not present

## 2019-07-09 DIAGNOSIS — M47816 Spondylosis without myelopathy or radiculopathy, lumbar region: Secondary | ICD-10-CM

## 2019-07-09 DIAGNOSIS — I890 Lymphedema, not elsewhere classified: Secondary | ICD-10-CM | POA: Diagnosis not present

## 2019-07-09 DIAGNOSIS — M5416 Radiculopathy, lumbar region: Secondary | ICD-10-CM

## 2019-07-09 DIAGNOSIS — Z853 Personal history of malignant neoplasm of breast: Secondary | ICD-10-CM | POA: Diagnosis not present

## 2019-07-09 DIAGNOSIS — M546 Pain in thoracic spine: Secondary | ICD-10-CM

## 2019-07-09 DIAGNOSIS — G894 Chronic pain syndrome: Secondary | ICD-10-CM | POA: Diagnosis not present

## 2019-07-09 DIAGNOSIS — M7061 Trochanteric bursitis, right hip: Secondary | ICD-10-CM

## 2019-07-09 DIAGNOSIS — M5126 Other intervertebral disc displacement, lumbar region: Secondary | ICD-10-CM | POA: Diagnosis not present

## 2019-07-09 DIAGNOSIS — J4520 Mild intermittent asthma, uncomplicated: Secondary | ICD-10-CM | POA: Diagnosis not present

## 2019-07-09 DIAGNOSIS — M62838 Other muscle spasm: Secondary | ICD-10-CM | POA: Diagnosis not present

## 2019-07-09 DIAGNOSIS — I10 Essential (primary) hypertension: Secondary | ICD-10-CM | POA: Diagnosis not present

## 2019-07-09 DIAGNOSIS — M7062 Trochanteric bursitis, left hip: Secondary | ICD-10-CM

## 2019-07-09 MED ORDER — FENTANYL 12 MCG/HR TD PT72: 1.0000 | MEDICATED_PATCH | TRANSDERMAL | 0 refills | Status: DC

## 2019-07-09 MED ORDER — OXYCODONE-ACETAMINOPHEN 10-325 MG PO TABS: 1.0000 | ORAL_TABLET | Freq: Three times a day (TID) | ORAL | 0 refills | Status: DC | PRN

## 2019-07-09 NOTE — Progress Notes (Signed)
Subjective:    Patient ID: Krista Doyle, female    DOB: Jun 15, 1960, 59 y.o.   MRN: [id number]  HPI: Krista Doyle is a 59 y.o. female who returns for follow up appointment for chronic pain and medication refill. She states her pain is located in her mid- lower back radiating into her right lower extremity and reports bilateral hip pain L>R. Also reports increase intensity of left hip pain for the last three weeks, she denies falling. Also states she called her PCP and has a scheduled appointment with her PCP today at 3:15. X-ray ordered was placed, she was instructed to call or send a My chart message after seeing her PCP, she verbalizes understanding. We discussed a steroid injection or Medrol Dose Pak, she will speak with her PCP regarding the above  and call or send a MY-Chart message with an update she reports. She rates her pain 5. Her current exercise regime is walking short distances with her cane.   Ms. Holladay Morphine equivalent is 73.80 MME.    Last Oral Swab was Performed on 04/08/2019, it was consistent.    Pain Inventory Average Pain 7 Pain Right Now 5 My pain is sharp, burning and stabbing  In the last 24 hours, has pain interfered with the following? General activity 6 Relation with others 6 Enjoyment of life 9 What TIME of day is your pain at its worst? evening Sleep (in general)  and nigh  Pain is worse with: walking, bending, sitting, standing and some activites Pain improves with: heat/ice, medication and rest Relief from Meds: 7  Mobility walk with assistance use a cane use a walker how many minutes can you walk? 5-7 mins  Function disabled: date disabled 2008  Neuro/Psych weakness tingling trouble walking spasms  Prior Studies   Physicians involved in your care    Family History  Problem Relation Age of Onset  . Colon cancer Neg Hx    Social History   Socioeconomic History  . Marital status: Married    Spouse name: Not on file  .  Number of children: Not on file  . Years of education: Not on file  . Highest education level: Not on file  Occupational History  . Occupation: Disabled  Tobacco Use  . Smoking status: Never Smoker  . Smokeless tobacco: Never Used  Substance and Sexual Activity  . Alcohol use: No    Alcohol/week: 0.0 standard drinks  . Drug use: No  . Sexual activity: Yes  Other Topics Concern  . Not on file  Social History Narrative  . Not on file   Social Determinants of Health   Financial Resource Strain:   . Difficulty of Paying Living Expenses:   Food Insecurity:   . Worried About Charity fundraiser in the Last Year:   . Arboriculturist in the Last Year:   Transportation Needs:   . Film/video editor (Medical):   Marland Kitchen Lack of Transportation (Non-Medical):   Physical Activity:   . Days of Exercise per Week:   . Minutes of Exercise per Session:   Stress:   . Feeling of Stress :   Social Connections:   . Frequency of Communication with Friends and Family:   . Frequency of Social Gatherings with Friends and Family:   . Attends Religious Services:   . Active Member of Clubs or Organizations:   . Attends Archivist Meetings:   Marland Kitchen Marital Status:    Past Surgical History:  Procedure  Laterality Date  . BACK SURGERY    . BREAST LUMPECTOMY    . COLONOSCOPY     about 3 yrs ago in [place]  . KNEE SURGERY     right  . TONSILLECTOMY     Past Medical History:  Diagnosis Date  . Asthma   . Back pain   . Breast cancer ([place]) 2008   left  . Diabetes ([place])   . Hypercholesterolemia   . Hypertension   . 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 Personal history of chemotherapy   . Personal history of radiation therapy 2008   BP 132/87   Pulse 85   Ht 5\' 4"  (1.626 m)   Wt 129 lb (58.5 kg)   BMI 22.14 kg/m   Opioid Risk Score:   Fall Risk Score:  `1  Depression screen PHQ 2/9  Depression screen [facility] 2/9 01/03/2019 12/11/2017 11/07/2017 04/21/2017 01/13/2017  05/27/2016 03/28/2016  Decreased Interest 0 0 0 - 0 0 0  Down, Depressed, Hopeless 0 0 0 - 0 0 0  PHQ - 2 Score 0 0 0 - 0 0 0  Altered sleeping - - - 0 - - -  Tired, decreased energy - - - 0 - - -  Change in appetite - - - - - - -  Feeling bad or failure about yourself  - - - - - - -  Trouble concentrating - - - - - - -  Moving slowly or fidgety/restless - - - - - - -  Suicidal thoughts - - - - - - -  PHQ-9 Score - - - - - - -  Difficult doing work/chores - - - - - - -   Review of Systems  Constitutional: Negative.   HENT: Negative.   Eyes: Negative.   Respiratory: Negative.   Cardiovascular: Negative.   Gastrointestinal: Negative.   Endocrine: Negative.   Musculoskeletal:       Tingling  Skin: Negative.   Allergic/Immunologic: Negative.   Neurological: Positive for weakness and numbness.       Tingling  All other systems reviewed and are negative.      Objective:   Physical Exam Vitals and nursing note reviewed.  Constitutional:      Appearance: Normal appearance.  Cardiovascular:     Rate and Rhythm: Normal rate and regular rhythm.     Pulses: Normal pulses.     Heart sounds: Normal heart sounds.  Pulmonary:     Effort: Pulmonary effort is normal.     Breath sounds: Normal breath sounds.  Musculoskeletal:     Cervical back: Normal range of motion and neck supple.     Comments: Normal Muscle Bulk and Muscle Testing Reveals:  Upper Extremities: Right: Full ROM and Muscle Strength 4/5 Left Upper Extremity: Decreased ROM 45 Degrees and Muscle Strength 3/5  Thoracic Paraspinal Tenderness: T-3-T-7 Lumbar Hypersensitivity Bilateral Greater Trochanteric Tenderness: L>R Lower Extremities: Decreased ROM  And Muscle Strength 4/5 Right Lower Extremity Flexion Produces Pain into Her Right Hip and Right Lower Extremity Left Lower Extremity Flexion Produces Pain into Her Left Hip  Arises from Table Slowly using Cane for Support Antalgic Gait   Skin:    General: Skin is warm  and dry.  Neurological:     Mental Status: She is alert and oriented to person, place, and time.  Psychiatric:        Mood and Affect: Mood normal.        Behavior: Behavior  normal.           Assessment & Plan:  1. Chronic low back pain, Lumbar Facet Arthropathy,lumbar spondylosis, spondylolisthesis, with lumbar radiculopathy.Continuecurrentmedication regimen:07/09/2019 Refilled: Fentanyl 12 mcg one patch every three days #10 and Oxycodone 10/325 mg one tablet every 8 hours as needed for pain #90.  We will continue the opioid monitoring program, this consists of regular clinic visits, examinations, urine drug screen, pill counts as well as use of [place] Controlled Substance reporting System. 2. Chemotherapy induced peripheral neuropathy:ContinueLyrica.07/09/2019 3. Chronic Midline Thoracic Pain: Continue HEP and Current Medication Regime.07/09/2019 4. Muscle Spasm: Continuemedication regimen withRobaxin06/09/2019 5. Right Knee Pain:No complaints today.Continue Current Medication Regime: Continue HEP as ToleratedandContinue to Monitor.07/09/2019 6.Bilateral  Greater Trochanteric Bursitis L>R:Continue to Alternate Ice and Heat Therapy.Continue to Monitor.07/09/2019 7. Acute Left Hip Pain: RX: X-Ray. Ms. Morreale scheduled appointment with her PCP for today she reports at 3:15. We discussed Medrol Dose Pak or Left Hip Steroid Injection, she will discussed the above with HER PCP she reports.  8. Chronic Pain Syndrome: ContinueAmitriptyline, Fentanyly and Oxycodone. Continue to Monitor.07/09/2019  F/U in 1 month   69minutes of face to face patient care time was spent during this visit. All questions were encouraged and answered.

## 2019-07-10 ENCOUNTER — Other Ambulatory Visit (HOSPITAL_COMMUNITY): Payer: Self-pay | Admitting: Internal Medicine

## 2019-07-10 ENCOUNTER — Ambulatory Visit (HOSPITAL_COMMUNITY)
Admission: RE | Admit: 2019-07-10 | Discharge: 2019-07-10 | Disposition: A | Discharge status: Home / Self Care | Payer: BC Managed Care – PPO | Source: Ambulatory Visit | Attending: Internal Medicine | Admitting: Internal Medicine

## 2019-07-10 DIAGNOSIS — M25552 Pain in left hip: Secondary | ICD-10-CM

## 2019-07-10 DIAGNOSIS — M545 Low back pain, unspecified: Secondary | ICD-10-CM

## 2019-07-18 ENCOUNTER — Other Ambulatory Visit (HOSPITAL_COMMUNITY): Payer: Self-pay | Admitting: Internal Medicine

## 2019-07-18 DIAGNOSIS — Z853 Personal history of malignant neoplasm of breast: Secondary | ICD-10-CM

## 2019-07-22 ENCOUNTER — Encounter (HOSPITAL_COMMUNITY): Payer: Self-pay

## 2019-07-22 ENCOUNTER — Other Ambulatory Visit: Payer: Self-pay

## 2019-07-22 ENCOUNTER — Ambulatory Visit (HOSPITAL_COMMUNITY): Admission: RE | Admit: 2019-07-22 | Payer: Medicare Other | Source: Ambulatory Visit

## 2019-08-07 ENCOUNTER — Encounter: Payer: Self-pay | Admitting: Registered Nurse

## 2019-08-07 ENCOUNTER — Encounter: Payer: BC Managed Care – PPO | Attending: Physical Medicine & Rehabilitation | Admitting: Registered Nurse

## 2019-08-07 ENCOUNTER — Other Ambulatory Visit: Payer: Self-pay

## 2019-08-07 VITALS — BP 138/93 | HR 112 | Temp 99.1°F | Ht 64.0 in | Wt 139.0 lb

## 2019-08-07 DIAGNOSIS — M25552 Pain in left hip: Secondary | ICD-10-CM | POA: Diagnosis not present

## 2019-08-07 DIAGNOSIS — Z5181 Encounter for therapeutic drug level monitoring: Secondary | ICD-10-CM

## 2019-08-07 DIAGNOSIS — Z9221 Personal history of antineoplastic chemotherapy: Secondary | ICD-10-CM | POA: Diagnosis not present

## 2019-08-07 DIAGNOSIS — G894 Chronic pain syndrome: Secondary | ICD-10-CM

## 2019-08-07 DIAGNOSIS — Z853 Personal history of malignant neoplasm of breast: Secondary | ICD-10-CM | POA: Insufficient documentation

## 2019-08-07 DIAGNOSIS — M545 Low back pain: Secondary | ICD-10-CM | POA: Diagnosis not present

## 2019-08-07 DIAGNOSIS — Z79891 Long term (current) use of opiate analgesic: Secondary | ICD-10-CM

## 2019-08-07 DIAGNOSIS — M5416 Radiculopathy, lumbar region: Secondary | ICD-10-CM

## 2019-08-07 DIAGNOSIS — M546 Pain in thoracic spine: Secondary | ICD-10-CM | POA: Diagnosis not present

## 2019-08-07 DIAGNOSIS — M62838 Other muscle spasm: Secondary | ICD-10-CM | POA: Diagnosis not present

## 2019-08-07 DIAGNOSIS — T451X5A Adverse effect of antineoplastic and immunosuppressive drugs, initial encounter: Secondary | ICD-10-CM | POA: Diagnosis not present

## 2019-08-07 DIAGNOSIS — I890 Lymphedema, not elsewhere classified: Secondary | ICD-10-CM | POA: Diagnosis not present

## 2019-08-07 DIAGNOSIS — M47816 Spondylosis without myelopathy or radiculopathy, lumbar region: Secondary | ICD-10-CM | POA: Diagnosis not present

## 2019-08-07 DIAGNOSIS — G8929 Other chronic pain: Secondary | ICD-10-CM | POA: Diagnosis not present

## 2019-08-07 DIAGNOSIS — G620 Drug-induced polyneuropathy: Secondary | ICD-10-CM | POA: Diagnosis not present

## 2019-08-07 DIAGNOSIS — M7061 Trochanteric bursitis, right hip: Secondary | ICD-10-CM

## 2019-08-07 DIAGNOSIS — M25551 Pain in right hip: Secondary | ICD-10-CM

## 2019-08-07 MED ORDER — FENTANYL 12 MCG/HR TD PT72: 1.0000 | MEDICATED_PATCH | TRANSDERMAL | 0 refills | Status: DC

## 2019-08-07 MED ORDER — OXYCODONE-ACETAMINOPHEN 10-325 MG PO TABS: 1.0000 | ORAL_TABLET | Freq: Three times a day (TID) | ORAL | 0 refills | Status: DC | PRN

## 2019-08-07 NOTE — Progress Notes (Signed)
Subjective:    Patient ID: Krista Doyle, female    DOB: 11-May-1960, 59 y.o.   MRN: [id number]  HPI: Krista Doyle is a 59 y.o. female who returns for follow up appointment for chronic pain and medication refill. She states her pain is located in her mid- lower back pain radiating into her right hip and right lower extremity. Also reports her right hip pain occurred 2 months ago and PCP is following, she also states she will be calling her oncologist due to increase right hip pain and loss of weight over the last 5 months. She was encouraged to call her PCP and Oncologist, she verbalizes understanding. She rates her pain 6. Her current exercise regime is walking and performing stretching exercises.  Ms. Quesada Morphine equivalent is 73.80 MME.  Last Oral Swab was Performed on 04/08/2019, it was consistent.    Pain Inventory Average Pain 7 Pain Right Now 6 My pain is sharp and stabbing  In the last 24 hours, has pain interfered with the following? General activity 7 Relation with others 7 Enjoyment of life 8 What TIME of day is your pain at its worst? evening, night Sleep (in general) Fair  Pain is worse with: walking, bending, sitting, standing and some activites Pain improves with: medication and TENS Relief from Meds: 7  Mobility walk with assistance use a cane use a walker how many minutes can you walk? 10 ability to climb steps?  yes do you drive?  yes  Function disabled: date disabled 2008 Do you have any goals in this area?  yes  Neuro/Psych weakness spasms  Prior Studies Any changes since last visit?  no  Physicians involved in your care Any changes since last visit?  no   Family History  Problem Relation Age of Onset  . Colon cancer Neg Hx    Social History   Socioeconomic History  . Marital status: Married    Spouse name: Not on file  . Number of children: Not on file  . Years of education: Not on file  . Highest education level: Not on file    Occupational History  . Occupation: Disabled  Tobacco Use  . Smoking status: Never Smoker  . Smokeless tobacco: Never Used  Substance and Sexual Activity  . Alcohol use: No    Alcohol/week: 0.0 standard drinks  . Drug use: No  . Sexual activity: Yes  Other Topics Concern  . Not on file  Social History Narrative  . Not on file   Social Determinants of Health   Financial Resource Strain:   . Difficulty of Paying Living Expenses:   Food Insecurity:   . Worried About Charity fundraiser in the Last Year:   . Arboriculturist in the Last Year:   Transportation Needs:   . Film/video editor (Medical):   Marland Kitchen Lack of Transportation (Non-Medical):   Physical Activity:   . Days of Exercise per Week:   . Minutes of Exercise per Session:   Stress:   . Feeling of Stress :   Social Connections:   . Frequency of Communication with Friends and Family:   . Frequency of Social Gatherings with Friends and Family:   . Attends Religious Services:   . Active Member of Clubs or Organizations:   . Attends Archivist Meetings:   Marland Kitchen Marital Status:    Past Surgical History:  Procedure Laterality Date  . BACK SURGERY    . BREAST LUMPECTOMY    .  COLONOSCOPY     about 3 yrs ago in [place]  . KNEE SURGERY     right  . TONSILLECTOMY     Past Medical History:  Diagnosis Date  . Asthma   . Back pain   . Breast cancer ([place]) 2008   left  . Diabetes ([place])   . Hypercholesterolemia   . Hypertension   . 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 Personal history of chemotherapy   . Personal history of radiation therapy 2008   There were no vitals taken for this visit.  Opioid Risk Score:   Fall Risk Score:  `1  Depression screen PHQ 2/9  Depression screen [facility] 2/9 01/03/2019 12/11/2017 11/07/2017 04/21/2017 01/13/2017 05/27/2016 03/28/2016  Decreased Interest 0 0 0 - 0 0 0  Down, Depressed, Hopeless 0 0 0 - 0 0 0  PHQ - 2 Score 0 0 0 - 0 0 0  Altered sleeping - - - 0 - - -   Tired, decreased energy - - - 0 - - -  Change in appetite - - - - - - -  Feeling bad or failure about yourself  - - - - - - -  Trouble concentrating - - - - - - -  Moving slowly or fidgety/restless - - - - - - -  Suicidal thoughts - - - - - - -  PHQ-9 Score - - - - - - -  Difficult doing work/chores - - - - - - -   Review of Systems  Constitutional: Negative.   HENT: Negative.   Eyes: Negative.   Respiratory: Negative.   Cardiovascular: Negative.   Gastrointestinal: Negative.   Endocrine: Negative.   Genitourinary: Negative.   Musculoskeletal: Positive for arthralgias and gait problem.       Spasms  Skin: Negative.   Allergic/Immunologic: Negative.   Neurological: Positive for weakness.       Tingling  Hematological: Negative.   Psychiatric/Behavioral: Negative.   All other systems reviewed and are negative.      Objective:   Physical Exam Vitals and nursing note reviewed.  Constitutional:      Appearance: Normal appearance.  Cardiovascular:     Rate and Rhythm: Normal rate and regular rhythm.     Pulses: Normal pulses.     Heart sounds: Normal heart sounds.  Pulmonary:     Effort: Pulmonary effort is normal.     Breath sounds: Normal breath sounds.  Musculoskeletal:     Cervical back: Normal range of motion and neck supple.     Comments: Normal Muscle Bulk and Muscle Testing Reveals:  Upper Extremities: Right: Full ROM and Muscle Strength 5/5 Left Upper Extremity: Decreased ROM 30 Degrees and Muscle Strength 3/5  Thoracic Paraspinal Tenderness: T-7-T-9 Lumbar Hypersensitivity Right Greater Trochanter Tenderness Right Pelvic region with tenderness noted with palpation Lower Extremities: Right: Decreased ROM and Muscle Strength 4/5 Right Lower Extremity Flexion Produces Pain into her Lumbar Left Lower Extremity: Full ROM and Muscle Strength 5/5 Arises from Table Slowly using cane for support Antalgic  Gait   Skin:    General: Skin is warm and dry.   Neurological:     Mental Status: She is alert and oriented to person, place, and time.  Psychiatric:        Mood and Affect: Mood normal.        Behavior: Behavior normal.           Assessment & Plan:  1. Chronic  low back pain, Lumbar Facet Arthropathy,lumbar spondylosis, spondylolisthesis, with lumbar radiculopathy.Continuecurrentmedication regimen:08/07/2019 Refilled: Fentanyl 12 mcg one patch every three days #10 and Oxycodone 10/325 mg one tablet every 8 hours as needed for pain #90.  We will continue the opioid monitoring program, this consists of regular clinic visits, examinations, urine drug screen, pill counts as well as use of [place] Controlled Substance reporting System. 2. Chemotherapy induced peripheral neuropathy:ContinueLyrica.08/07/2019 3. Chronic Midline Thoracic Pain: Continue HEP and Current Medication Regime.08/07/2019 4. Muscle Spasm: Continuemedication regimen withRobaxin07/08/2019 5. Right Knee Pain:No complaints today.Continue Current Medication Regime: Continue HEP as ToleratedandContinue to Monitor.08/07/2019 6.Right  Greater Trochanteric Bursitis:Continue to Alternate Ice and Heat Therapy.Continue to Monitor.08/07/2019 7. Acute Right Hip Pain:  Ms. Hofstra states her PCP is following. She was instructed to call her PCP, she also states she is going to call her oncologist. She was instructed to call office with update, she verbalizes understanding.  8. Chronic Pain Syndrome: ContinueAmitriptyline, Fentanyly and Oxycodone. Continue to Monitor.08/07/2019  F/U in 1 month   68minutes of face to face patient care time was spent during this visit. All questions were encouraged and answered.

## 2019-08-08 ENCOUNTER — Ambulatory Visit: Payer: Medicare Other | Admitting: Registered Nurse

## 2019-09-03 ENCOUNTER — Telehealth: Payer: Self-pay | Admitting: Radiology

## 2019-09-03 ENCOUNTER — Encounter: Payer: Self-pay | Admitting: Orthopedic Surgery

## 2019-09-03 ENCOUNTER — Other Ambulatory Visit: Payer: Self-pay

## 2019-09-03 ENCOUNTER — Ambulatory Visit (INDEPENDENT_AMBULATORY_CARE_PROVIDER_SITE_OTHER): Payer: BC Managed Care – PPO | Admitting: Orthopedic Surgery

## 2019-09-03 VITALS — BP 141/91 | HR 108 | Ht 63.0 in | Wt 127.0 lb

## 2019-09-03 DIAGNOSIS — M5126 Other intervertebral disc displacement, lumbar region: Secondary | ICD-10-CM | POA: Diagnosis not present

## 2019-09-03 DIAGNOSIS — M5410 Radiculopathy, site unspecified: Secondary | ICD-10-CM

## 2019-09-03 DIAGNOSIS — Z853 Personal history of malignant neoplasm of breast: Secondary | ICD-10-CM | POA: Diagnosis not present

## 2019-09-03 DIAGNOSIS — M545 Low back pain, unspecified: Secondary | ICD-10-CM
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
## 2019-09-03 NOTE — Telephone Encounter (Signed)
error 

## 2019-09-10 ENCOUNTER — Other Ambulatory Visit: Payer: Self-pay

## 2019-09-10 ENCOUNTER — Encounter: Payer: BC Managed Care – PPO | Attending: Physical Medicine & Rehabilitation | Admitting: Registered Nurse

## 2019-09-10 ENCOUNTER — Encounter: Payer: Self-pay | Admitting: Registered Nurse

## 2019-09-10 VITALS — BP 121/80 | HR 102 | Temp 98.0°F | Ht 63.0 in | Wt 128.0 lb

## 2019-09-10 DIAGNOSIS — Z79891 Long term (current) use of opiate analgesic: Secondary | ICD-10-CM | POA: Insufficient documentation

## 2019-09-10 DIAGNOSIS — G620 Drug-induced polyneuropathy: Secondary | ICD-10-CM | POA: Diagnosis not present

## 2019-09-10 DIAGNOSIS — M5410 Radiculopathy, site unspecified: Secondary | ICD-10-CM | POA: Diagnosis not present

## 2019-09-10 DIAGNOSIS — Z5181 Encounter for therapeutic drug level monitoring: Secondary | ICD-10-CM | POA: Diagnosis not present

## 2019-09-10 DIAGNOSIS — G894 Chronic pain syndrome: Secondary | ICD-10-CM

## 2019-09-10 DIAGNOSIS — M62838 Other muscle spasm: Secondary | ICD-10-CM | POA: Insufficient documentation

## 2019-09-10 DIAGNOSIS — G8929 Other chronic pain: Secondary | ICD-10-CM | POA: Diagnosis not present

## 2019-09-10 DIAGNOSIS — I890 Lymphedema, not elsewhere classified: Secondary | ICD-10-CM | POA: Diagnosis not present

## 2019-09-10 DIAGNOSIS — M546 Pain in thoracic spine: Secondary | ICD-10-CM

## 2019-09-10 DIAGNOSIS — M545 Low back pain, unspecified: Secondary | ICD-10-CM

## 2019-09-10 DIAGNOSIS — M25552 Pain in left hip: Secondary | ICD-10-CM | POA: Diagnosis not present

## 2019-09-10 DIAGNOSIS — M7061 Trochanteric bursitis, right hip: Secondary | ICD-10-CM

## 2019-09-10 DIAGNOSIS — T451X5A Adverse effect of antineoplastic and immunosuppressive drugs, initial encounter: Secondary | ICD-10-CM | POA: Diagnosis not present

## 2019-09-10 DIAGNOSIS — Z9221 Personal history of antineoplastic chemotherapy: Secondary | ICD-10-CM | POA: Insufficient documentation

## 2019-09-10 DIAGNOSIS — M47816 Spondylosis without myelopathy or radiculopathy, lumbar region: Secondary | ICD-10-CM | POA: Diagnosis not present

## 2019-09-10 DIAGNOSIS — Z853 Personal history of malignant neoplasm of breast: Secondary | ICD-10-CM | POA: Diagnosis not present

## 2019-09-10 DIAGNOSIS — M5416 Radiculopathy, lumbar region: Secondary | ICD-10-CM | POA: Diagnosis not present

## 2019-09-10 MED ORDER — OXYCODONE-ACETAMINOPHEN 10-325 MG PO TABS: 1.0000 | ORAL_TABLET | Freq: Three times a day (TID) | ORAL | 0 refills | Status: DC | PRN

## 2019-09-10 MED ORDER — FENTANYL 12 MCG/HR TD PT72: 1.0000 | MEDICATED_PATCH | TRANSDERMAL | 0 refills | Status: DC

## 2019-09-10 NOTE — Progress Notes (Signed)
Subjective:    Patient ID: Krista Doyle, female    DOB: 22-Dec-1960, 59 y.o.   MRN: [id number]  HPI: Krista Doyle is a 59 y.o. female who returns for follow up appointment for chronic pain and medication refill. She states her pain is located in her lower back radiating into her right hip and right lower extremity. Also reports she's experiencing increase intensity of right hip pain, she is scheduled for MRI and Ortho is following, she will keep this provider updated, she states.  She rates her pain 5. Her current exercise regime is walking short distances with her walker or cane.   Krista Doyle equivalent is 73.80   MME.  Oral Swab was Performed Today.   Pain Inventory Average Pain 8 Pain Right Now 5 My pain is sharp and aching  In the last 24 hours, has pain interfered with the following? General activity 6 Relation with others 7 Enjoyment of life 8 What TIME of day is your pain at its worst? evening night Sleep (in general) Poor  Pain is worse with: walking, bending, sitting, standing and some activites Pain improves with: rest, heat/ice and medication Relief from Meds: 8  Mobility use a cane how many minutes can you walk? 3-5 ability to climb steps?  yes do you drive?  yes  Function disabled: date disabled . I need assistance with the following:  meal prep, household duties and shopping  Neuro/Psych trouble walking  Prior Studies Any changes since last visit?  no  Physicians involved in your care Any changes since last visit?  no   Family History  Problem Relation Age of Onset  . Colon cancer Neg Hx    Social History   Socioeconomic History  . Marital status: Married    Spouse name: Not on file  . Number of children: Not on file  . Years of education: Not on file  . Highest education level: Not on file  Occupational History  . Occupation: Disabled  Tobacco Use  . Smoking status: Never Smoker  . Smokeless tobacco: Never Used  Substance and  Sexual Activity  . Alcohol use: No    Alcohol/week: 0.0 standard drinks  . Drug use: No  . Sexual activity: Yes  Other Topics Concern  . Not on file  Social History Narrative  . Not on file   Social Determinants of Health   Financial Resource Strain:   . Difficulty of Paying Living Expenses:   Food Insecurity:   . Worried About Charity fundraiser in the Last Year:   . Arboriculturist in the Last Year:   Transportation Needs:   . Film/video editor (Medical):   Marland Kitchen Lack of Transportation (Non-Medical):   Physical Activity:   . Days of Exercise per Week:   . Minutes of Exercise per Session:   Stress:   . Feeling of Stress :   Social Connections:   . Frequency of Communication with Friends and Family:   . Frequency of Social Gatherings with Friends and Family:   . Attends Religious Services:   . Active Member of Clubs or Organizations:   . Attends Archivist Meetings:   Marland Kitchen Marital Status:    Past Surgical History:  Procedure Laterality Date  . BACK SURGERY    . BREAST LUMPECTOMY    . COLONOSCOPY     about 3 yrs ago in [place]  . KNEE SURGERY     right  . TONSILLECTOMY  Past Medical History:  Diagnosis Date  . Asthma   . Back pain   . Breast cancer ([place]) 2008   left  . Diabetes ([place])   . Hypercholesterolemia   . Hypertension   . 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 Personal history of chemotherapy   . Personal history of radiation therapy 2008   BP 121/80   Pulse (!) 109   Temp 98 F (36.7 C)   Ht 5\' 3"  (1.6 m)   Wt 128 lb (58.1 kg)   SpO2 99%   BMI 22.67 kg/m   Opioid Risk Score:   Fall Risk Score:  `1  Depression screen PHQ 2/9  Depression screen [facility] 2/9 01/03/2019 12/11/2017 11/07/2017 04/21/2017 01/13/2017 05/27/2016 03/28/2016  Decreased Interest 0 0 0 - 0 0 0  Down, Depressed, Hopeless 0 0 0 - 0 0 0  PHQ - 2 Score 0 0 0 - 0 0 0  Altered sleeping - - - 0 - - -  Tired, decreased energy - - - 0 - - -  Change in appetite -  - - - - - -  Feeling bad or failure about yourself  - - - - - - -  Trouble concentrating - - - - - - -  Moving slowly or fidgety/restless - - - - - - -  Suicidal thoughts - - - - - - -  PHQ-9 Score - - - - - - -  Difficult doing work/chores - - - - - - -    Review of Systems  Musculoskeletal: Positive for gait problem.  Neurological: Positive for weakness.  All other systems reviewed and are negative.      Objective:   Physical Exam Vitals and nursing note reviewed.  Constitutional:      Appearance: Normal appearance.  Cardiovascular:     Rate and Rhythm: Normal rate and regular rhythm.     Pulses: Normal pulses.     Heart sounds: Normal heart sounds.  Pulmonary:     Effort: Pulmonary effort is normal.     Breath sounds: Normal breath sounds.  Musculoskeletal:     Cervical back: Normal range of motion and neck supple.     Comments: Normal Muscle Bulk and Muscle Testing Reveals:  Upper Extremities: Right : Full ROM and Muscle Strength 4/5 Left Upper Extremity: Decreased ROM  Thoracic, Lumbar Lower Extremities Gait   Skin:    General: Skin is warm and dry.  Neurological:     Mental Status: She is alert and oriented to person, place, and time.  Psychiatric:        Mood and Affect: Mood normal.        Behavior: Behavior normal.           Assessment & Plan:  1. Chronic low back pain, Lumbar Facet Arthropathy,lumbar spondylosis, spondylolisthesis, with lumbar radiculopathy.Continuecurrentmedication regimen:09/10/2019 Refilled: Fentanyl 12 mcg one patch every three days #10 and Oxycodone 10/325 mg one tablet every 8 hours as needed for pain #90.  We will continue the opioid monitoring program, this consists of regular clinic visits, examinations, urine drug screen, pill counts as well as use of [place] Controlled Substance reporting System. 2. Chemotherapy induced peripheral neuropathy:ContinueLyrica.09/10/2019 3. Chronic Midline Thoracic Pain: Continue HEP  and Current Medication Regime.09/10/2019 4. Muscle Spasm: Continuemedication regimen withRobaxin08/11/2019 5. Right Knee Pain:No complaints today.Continue Current Medication Regime: Continue HEP as ToleratedandContinue to Monitor.09/10/2019 6.RightGreater Trochanteric Bursitis:Continue to Alternate Ice and Heat Therapy.Continue to Monitor.09/10/2019 7. Acute  Right Hip Pain: Ms. Para states ortho following: She's scheduled for MRI, awaiting results. 8. Chronic Pain Syndrome: ContinueAmitriptyline, Fentanyly andOxycodone. Continue to Monitor.09/10/2019  F/U in 1 month   30minutes of face to face patient care time was spent during this visit. All questions were encouraged and answered.

## 2019-09-11 LAB — COMPLETE METABOLIC PANEL WITH GFR
AG Ratio: 0.8 (calc) — ABNORMAL LOW (ref 1.0–2.5)
ALT: 12 U/L (ref 6–29)
AST: 29 U/L (ref 10–35)
Albumin: 3.4 g/dL — ABNORMAL LOW (ref 3.6–5.1)
Alkaline phosphatase (APISO): 168 U/L — ABNORMAL HIGH (ref 37–153)
BUN/Creatinine Ratio: 11 (calc) (ref 6–22)
BUN: 12 mg/dL (ref 7–25)
CO2: 33 mmol/L — ABNORMAL HIGH (ref 20–32)
Calcium: 10.3 mg/dL (ref 8.6–10.4)
Chloride: 100 mmol/L (ref 98–110)
Creat: 1.06 mg/dL — ABNORMAL HIGH (ref 0.50–0.99)
GFR, Est African American: 66 mL/min/{1.73_m2} (ref >=60)
GFR, Est Non African American: 57 mL/min/{1.73_m2} — ABNORMAL LOW (ref >=60)
Globulin: 4.1 g/dL (calc) — ABNORMAL HIGH (ref 1.9–3.7)
Glucose, Bld: 160 mg/dL — ABNORMAL HIGH (ref 65–99)
Potassium: 4.2 mmol/L (ref 3.5–5.3)
Sodium: 139 mmol/L (ref 135–146)
Total Bilirubin: 0.2 mg/dL (ref 0.2–1.2)
Total Protein: 7.5 g/dL (ref 6.1–8.1)

## 2019-09-13 LAB — DRUG TOX MONITOR 1 W/CONF, ORAL FLD
Amphetamines: NEGATIVE ng/mL (ref <10)
Barbiturates: NEGATIVE ng/mL (ref <10)
Benzodiazepines: NEGATIVE ng/mL (ref <0.50)
Buprenorphine: NEGATIVE ng/mL (ref <0.10)
Cocaine: NEGATIVE ng/mL (ref <5.0)
Codeine: NEGATIVE ng/mL (ref <2.5)
Dihydrocodeine: NEGATIVE ng/mL (ref <2.5)
Fentanyl: 2.11 ng/mL — ABNORMAL HIGH (ref <0.10)
Fentanyl: POSITIVE ng/mL — AB (ref <0.10)
Heroin Metabolite: NEGATIVE ng/mL (ref <1.0)
Hydrocodone: NEGATIVE ng/mL (ref <2.5)
Hydromorphone: NEGATIVE ng/mL (ref <2.5)
MARIJUANA: NEGATIVE ng/mL (ref <2.5)
MDMA: NEGATIVE ng/mL (ref <10)
Meprobamate: NEGATIVE ng/mL (ref <2.5)
Methadone: NEGATIVE ng/mL (ref <5.0)
Morphine: NEGATIVE ng/mL (ref <2.5)
Nicotine Metabolite: NEGATIVE ng/mL (ref <5.0)
Norhydrocodone: NEGATIVE ng/mL (ref <2.5)
Noroxycodone: 6 ng/mL — ABNORMAL HIGH (ref <2.5)
Opiates: POSITIVE ng/mL — AB (ref <2.5)
Oxycodone: 79.4 ng/mL — ABNORMAL HIGH (ref <2.5)
Oxymorphone: NEGATIVE ng/mL (ref <2.5)
Phencyclidine: NEGATIVE ng/mL (ref <10)
Tapentadol: NEGATIVE ng/mL (ref <5.0)
Tramadol: NEGATIVE ng/mL (ref <5.0)
Zolpidem: NEGATIVE ng/mL (ref <5.0)

## 2019-09-13 LAB — DRUG TOX ALC METAB W/CON, ORAL FLD: Alcohol Metabolite: NEGATIVE ng/mL (ref <25)

## 2019-09-15 ENCOUNTER — Other Ambulatory Visit: Payer: Self-pay | Admitting: Registered Nurse

## 2019-09-15 DIAGNOSIS — G620 Drug-induced polyneuropathy: Secondary | ICD-10-CM

## 2019-09-15 DIAGNOSIS — T451X5A Adverse effect of antineoplastic and immunosuppressive drugs, initial encounter: Secondary | ICD-10-CM

## 2019-09-15 DIAGNOSIS — G894 Chronic pain syndrome: Secondary | ICD-10-CM

## 2019-09-18 ENCOUNTER — Telehealth: Payer: Self-pay | Admitting: *Deleted

## 2019-09-18 NOTE — Telephone Encounter (Signed)
Oral swab drug screen was consistent for prescribed medications.  ?

## 2019-09-20 ENCOUNTER — Inpatient Hospital Stay (HOSPITAL_COMMUNITY): Payer: BC Managed Care – PPO | Attending: Hematology

## 2019-09-20 DIAGNOSIS — M25559 Pain in unspecified hip: Secondary | ICD-10-CM | POA: Diagnosis not present

## 2019-09-20 DIAGNOSIS — Z79899 Other long term (current) drug therapy: Secondary | ICD-10-CM | POA: Insufficient documentation

## 2019-09-20 DIAGNOSIS — G479 Sleep disorder, unspecified: Secondary | ICD-10-CM | POA: Insufficient documentation

## 2019-09-20 DIAGNOSIS — Z9221 Personal history of antineoplastic chemotherapy: Secondary | ICD-10-CM | POA: Insufficient documentation

## 2019-09-20 DIAGNOSIS — G8929 Other chronic pain: Secondary | ICD-10-CM | POA: Diagnosis not present

## 2019-09-20 DIAGNOSIS — M549 Dorsalgia, unspecified: Secondary | ICD-10-CM | POA: Insufficient documentation

## 2019-09-20 DIAGNOSIS — M8580 Other specified disorders of bone density and structure, unspecified site: Secondary | ICD-10-CM | POA: Diagnosis not present

## 2019-09-20 DIAGNOSIS — Z170 Estrogen receptor positive status [ER+]: Secondary | ICD-10-CM | POA: Insufficient documentation

## 2019-09-20 DIAGNOSIS — Z923 Personal history of irradiation: Secondary | ICD-10-CM | POA: Insufficient documentation

## 2019-09-20 DIAGNOSIS — R200 Anesthesia of skin: Secondary | ICD-10-CM | POA: Insufficient documentation

## 2019-09-20 DIAGNOSIS — R5383 Other fatigue: Secondary | ICD-10-CM | POA: Diagnosis not present

## 2019-09-20 DIAGNOSIS — C50412 Malignant neoplasm of upper-outer quadrant of left female breast: Secondary | ICD-10-CM | POA: Diagnosis not present

## 2019-09-20 LAB — CBC WITH DIFFERENTIAL/PLATELET
Abs Immature Granulocytes: 0.07 10*3/uL (ref 0.00–0.07)
Basophils Absolute: 0 10*3/uL (ref 0.0–0.1)
Basophils Relative: 0 %
Eosinophils Absolute: 0.3 10*3/uL (ref 0.0–0.5)
Eosinophils Relative: 3 %
HCT: 31.7 % — ABNORMAL LOW (ref 36.0–46.0)
Hemoglobin: 9.5 g/dL — ABNORMAL LOW (ref 12.0–15.0)
Immature Granulocytes: 1 %
Lymphocytes Relative: 40 %
Lymphs Abs: 2.9 10*3/uL (ref 0.7–4.0)
MCH: 25 pg — ABNORMAL LOW (ref 26.0–34.0)
MCHC: 30 g/dL (ref 30.0–36.0)
MCV: 83.4 fL (ref 80.0–100.0)
Monocytes Absolute: 0.6 10*3/uL (ref 0.1–1.0)
Monocytes Relative: 8 %
Neutro Abs: 3.5 10*3/uL (ref 1.7–7.7)
Neutrophils Relative %: 48 %
Platelets: 350 10*3/uL (ref 150–400)
RBC: 3.8 MIL/uL — ABNORMAL LOW (ref 3.87–5.11)
RDW: 16.6 % — ABNORMAL HIGH (ref 11.5–15.5)
WBC: 7.4 10*3/uL (ref 4.0–10.5)
nRBC: 0.4 % — ABNORMAL HIGH (ref 0.0–0.2)

## 2019-09-20 LAB — COMPREHENSIVE METABOLIC PANEL
ALT: 11 U/L (ref 0–44)
AST: 31 U/L (ref 15–41)
Albumin: 2.9 g/dL — ABNORMAL LOW (ref 3.5–5.0)
Alkaline Phosphatase: 157 U/L — ABNORMAL HIGH (ref 38–126)
Anion gap: 10 (ref 5–15)
BUN: 13 mg/dL (ref 6–20)
CO2: 29 mmol/L (ref 22–32)
Calcium: 10 mg/dL (ref 8.9–10.3)
Chloride: 102 mmol/L (ref 98–111)
Creatinine, Ser: 0.81 mg/dL (ref 0.44–1.00)
GFR calc Af Amer: >60 mL/min (ref >60)
GFR calc non Af Amer: >60 mL/min (ref >60)
Glucose, Bld: 131 mg/dL — ABNORMAL HIGH (ref 70–99)
Potassium: 3.2 mmol/L — ABNORMAL LOW (ref 3.5–5.1)
Sodium: 141 mmol/L (ref 135–145)
Total Bilirubin: 0.2 mg/dL — ABNORMAL LOW (ref 0.3–1.2)
Total Protein: 8 g/dL (ref 6.5–8.1)

## 2019-09-20 LAB — VITAMIN D 25 HYDROXY (VIT D DEFICIENCY, FRACTURES): Vit D, 25-Hydroxy: 110.36 ng/mL — ABNORMAL HIGH (ref 30–100)

## 2019-09-20 LAB — VITAMIN B12: Vitamin B-12: >7500 pg/mL — ABNORMAL HIGH (ref 180–914)

## 2019-09-20 LAB — LACTATE DEHYDROGENASE: LDH: 189 U/L (ref 98–192)

## 2019-09-23 DIAGNOSIS — M654 Radial styloid tenosynovitis [de Quervain]: Secondary | ICD-10-CM | POA: Diagnosis not present

## 2019-09-23 DIAGNOSIS — M25551 Pain in right hip: Secondary | ICD-10-CM | POA: Diagnosis not present

## 2019-09-23 DIAGNOSIS — E119 Type 2 diabetes mellitus without complications: Secondary | ICD-10-CM | POA: Diagnosis not present

## 2019-09-23 DIAGNOSIS — G629 Polyneuropathy, unspecified: Secondary | ICD-10-CM | POA: Diagnosis not present

## 2019-09-26 ENCOUNTER — Inpatient Hospital Stay (HOSPITAL_BASED_OUTPATIENT_CLINIC_OR_DEPARTMENT_OTHER): Payer: BC Managed Care – PPO | Admitting: Nurse Practitioner

## 2019-09-26 ENCOUNTER — Other Ambulatory Visit: Payer: Self-pay

## 2019-09-26 ENCOUNTER — Encounter (HOSPITAL_BASED_OUTPATIENT_CLINIC_OR_DEPARTMENT_OTHER): Payer: BC Managed Care – PPO | Admitting: Registered Nurse

## 2019-09-26 ENCOUNTER — Inpatient Hospital Stay (HOSPITAL_COMMUNITY): Payer: BC Managed Care – PPO

## 2019-09-26 VITALS — BP 131/85 | HR 109 | Temp 98.6°F | Ht 63.0 in | Wt 123.0 lb

## 2019-09-26 DIAGNOSIS — T451X5A Adverse effect of antineoplastic and immunosuppressive drugs, initial encounter: Secondary | ICD-10-CM | POA: Diagnosis not present

## 2019-09-26 DIAGNOSIS — C50412 Malignant neoplasm of upper-outer quadrant of left female breast: Secondary | ICD-10-CM | POA: Diagnosis not present

## 2019-09-26 DIAGNOSIS — Z5181 Encounter for therapeutic drug level monitoring: Secondary | ICD-10-CM | POA: Diagnosis not present

## 2019-09-26 DIAGNOSIS — G894 Chronic pain syndrome: Secondary | ICD-10-CM | POA: Diagnosis not present

## 2019-09-26 DIAGNOSIS — R5383 Other fatigue: Secondary | ICD-10-CM | POA: Diagnosis not present

## 2019-09-26 DIAGNOSIS — M549 Dorsalgia, unspecified: Secondary | ICD-10-CM | POA: Diagnosis not present

## 2019-09-26 DIAGNOSIS — Z1231 Encounter for screening mammogram for malignant neoplasm of breast: Secondary | ICD-10-CM

## 2019-09-26 DIAGNOSIS — M545 Low back pain, unspecified: Secondary | ICD-10-CM

## 2019-09-26 DIAGNOSIS — G620 Drug-induced polyneuropathy: Secondary | ICD-10-CM | POA: Diagnosis not present

## 2019-09-26 DIAGNOSIS — Z79891 Long term (current) use of opiate analgesic: Secondary | ICD-10-CM

## 2019-09-26 DIAGNOSIS — M62838 Other muscle spasm: Secondary | ICD-10-CM | POA: Diagnosis not present

## 2019-09-26 DIAGNOSIS — Z170 Estrogen receptor positive status [ER+]: Secondary | ICD-10-CM

## 2019-09-26 DIAGNOSIS — G8929 Other chronic pain: Secondary | ICD-10-CM | POA: Diagnosis not present

## 2019-09-26 DIAGNOSIS — M7061 Trochanteric bursitis, right hip: Secondary | ICD-10-CM

## 2019-09-26 DIAGNOSIS — Z923 Personal history of irradiation: Secondary | ICD-10-CM | POA: Diagnosis not present

## 2019-09-26 DIAGNOSIS — Z853 Personal history of malignant neoplasm of breast: Secondary | ICD-10-CM | POA: Diagnosis not present

## 2019-09-26 DIAGNOSIS — Z79899 Other long term (current) drug therapy: Secondary | ICD-10-CM | POA: Diagnosis not present

## 2019-09-26 DIAGNOSIS — M8580 Other specified disorders of bone density and structure, unspecified site: Secondary | ICD-10-CM

## 2019-09-26 DIAGNOSIS — Z9221 Personal history of antineoplastic chemotherapy: Secondary | ICD-10-CM | POA: Diagnosis not present

## 2019-09-26 DIAGNOSIS — M47816 Spondylosis without myelopathy or radiculopathy, lumbar region: Secondary | ICD-10-CM | POA: Diagnosis not present

## 2019-09-26 DIAGNOSIS — M546 Pain in thoracic spine: Secondary | ICD-10-CM | POA: Diagnosis not present

## 2019-09-26 DIAGNOSIS — M25559 Pain in unspecified hip: Secondary | ICD-10-CM | POA: Diagnosis not present

## 2019-09-26 DIAGNOSIS — G479 Sleep disorder, unspecified: Secondary | ICD-10-CM | POA: Diagnosis not present

## 2019-09-26 DIAGNOSIS — I890 Lymphedema, not elsewhere classified: Secondary | ICD-10-CM | POA: Diagnosis not present

## 2019-09-26 DIAGNOSIS — M5416 Radiculopathy, lumbar region: Secondary | ICD-10-CM

## 2019-09-26 DIAGNOSIS — R200 Anesthesia of skin: Secondary | ICD-10-CM | POA: Diagnosis not present

## 2019-09-26 DIAGNOSIS — M25552 Pain in left hip: Secondary | ICD-10-CM | POA: Diagnosis not present

## 2019-09-26 LAB — FOLATE: Folate: 9.7 ng/mL (ref >5.9)

## 2019-09-26 LAB — RETICULOCYTES
Immature Retic Fract: 27 % — ABNORMAL HIGH (ref 2.3–15.9)
RBC.: 4.12 MIL/uL (ref 3.87–5.11)
Retic Count, Absolute: 72.5 10*3/uL (ref 19.0–186.0)
Retic Ct Pct: 1.8 % (ref 0.4–3.1)

## 2019-09-26 LAB — IRON AND TIBC
Iron: 43 ug/dL (ref 28–170)
Saturation Ratios: 13 % (ref 10.4–31.8)
TIBC: 333 ug/dL (ref 250–450)
UIBC: 290 ug/dL

## 2019-09-26 LAB — FERRITIN: Ferritin: 787 ng/mL — ABNORMAL HIGH (ref 11–307)

## 2019-09-26 MED ORDER — FENTANYL 12 MCG/HR TD PT72: 1.0000 | MEDICATED_PATCH | TRANSDERMAL | 0 refills | Status: DC

## 2019-09-26 MED ORDER — DENOSUMAB 60 MG/ML ~~LOC~~ SOSY
60.0000 mg | PREFILLED_SYRINGE | Freq: Once | SUBCUTANEOUS | Status: AC
  Administered 2019-09-26: 60 mg via SUBCUTANEOUS

## 2019-09-26 MED ORDER — OXYCODONE-ACETAMINOPHEN 10-325 MG PO TABS: 1.0000 | ORAL_TABLET | Freq: Four times a day (QID) | ORAL | 0 refills | Status: DC | PRN

## 2019-09-26 NOTE — Progress Notes (Signed)
Krista Doyle presents today for denosumab injection. Lab work reviewed prior to administration. VSS. Pt reports taking Ca and Vit D as instructed. Pt denies tooth/jaw pain and denies recent or future invasive dental work. Injection tolerated well, see MAR for details. Site clean and dry, band aid applied. Pt discharged in satisfactory condition with follow up instructions.

## 2019-09-26 NOTE — Assessment & Plan Note (Signed)
1.  Left breast cancer: - She was diagnosed with breast cancer originally in 2008.  ER+, PR+, HER-2+ -She reportedly completed 1 year of Herceptin. - She took tamoxifen for 5 years.  She was previously followed by Dr. Talbert Cage. - She completed 5 years of Femara in 2018.  She is planning to stay on Femara for additional 5 years which will be complete 2023. -Last mammogram done on 03/14/2018 was BI-RADS Category 1 negative. -Patient reports she does have mild lymphedema in her left arm.  She uses her machine twice daily and wears a sleeve both which help. - Physical assessment today did not reveal any palpable masses or adenopathy. -Last mammogram done on 03/27/2019 showed BI-RADS Category 0 incomplete.  Showed possible right breast mass. -Mammogram repeated on 04/09/2019 showed BI-RADS Category 2 benign -Labs done on 09/26/2019 showed a decrease in hemoglobin from 11.2 to 9.5, alkaline phosphatase 157, LFTs WNL, ferritin 787, saturation 13, reticulocytes normal.  SPEP and nutritional deficiencies pending -She reports she is having back and hip pain.  We will order a bone scan and CT CAP.  She is having an MRI for her back pain on Monday.  We will see her back after her results  2.  Back pain: -Patient has chronic back pain. -She had an MRI of the spine done on 10/17/2014 that showed spinal stenosis. -She had a bone scan done on 05/10/2017 showed question of compression fractures. - She underwent a thoracic x-ray as well as sternal x-rays on 09/20/2017 that showed DJD and osteopenia. -She is being followed by pain management and may need an orthopedic evaluation if symptoms ongoing. - Patient was previously followed by Dr. Walden Field.  She did an SPEP which was negative. -She is having an MRI done on Monday.  3.  Osteopenia: -Her bone density test done on 03/27/2019 showed a T score of -1.0 osteopenia. -She is being treated with Prolia every 6 months. - She will continue taking calcium and vitamin D  daily. -She will get her Prolia injection today.

## 2019-09-26 NOTE — Patient Instructions (Signed)
[facility]  Discharge Instructions:  Denosumab injection What is this medicine? DENOSUMAB (den oh sue mab) slows bone breakdown. Prolia is used to treat osteoporosis in women after menopause and in men, and in people who are taking corticosteroids for 6 months or more. Xgeva is used to treat a high calcium level due to cancer and to prevent bone fractures and other bone problems caused by multiple myeloma or cancer bone metastases. Xgeva is also used to treat giant cell tumor of the bone. This medicine may be used for other purposes; ask your health care provider or pharmacist if you have questions. COMMON BRAND NAME(S): Prolia, XGEVA What should I tell my health care provider before I take this medicine? They need to know if you have any of these conditions:  dental disease  having surgery or tooth extraction  infection  kidney disease  low levels of calcium or Vitamin D in the blood  malnutrition  on hemodialysis  skin conditions or sensitivity  thyroid or parathyroid disease  an unusual reaction to denosumab, other medicines, foods, dyes, or preservatives  pregnant or trying to get pregnant  breast-feeding How should I use this medicine? This medicine is for injection under the skin. It is given by a health care professional in a hospital or clinic setting. A special MedGuide will be given to you before each treatment. Be sure to read this information carefully each time. For Prolia, talk to your pediatrician regarding the use of this medicine in children. Special care may be needed. For Xgeva, talk to your pediatrician regarding the use of this medicine in children. While this drug may be prescribed for children as young as 13 years for selected conditions, precautions do apply. Overdosage: If you think you have taken too much of this medicine contact a poison control center or emergency room at once. NOTE: This medicine is only for  you. Do not share this medicine with others. What if I miss a dose? It is important not to miss your dose. Call your doctor or health care professional if you are unable to keep an appointment. What may interact with this medicine? Do not take this medicine with any of the following medications:  other medicines containing denosumab This medicine may also interact with the following medications:  medicines that lower your chance of fighting infection  steroid medicines like prednisone or cortisone This list may not describe all possible interactions. Give your health care provider a list of all the medicines, herbs, non-prescription drugs, or dietary supplements you use. Also tell them if you smoke, drink alcohol, or use illegal drugs. Some items may interact with your medicine. What should I watch for while using this medicine? Visit your doctor or health care professional for regular checks on your progress. Your doctor or health care professional may order blood tests and other tests to see how you are doing. Call your doctor or health care professional for advice if you get a fever, chills or sore throat, or other symptoms of a cold or flu. Do not treat yourself. This drug may decrease your body's ability to fight infection. Try to avoid being around people who are sick. You should make sure you get enough calcium and vitamin D while you are taking this medicine, unless your doctor tells you not to. Discuss the foods you eat and the vitamins you take with your health care professional. See your dentist regularly. Brush and floss your teeth as directed.   Before you have any dental work done, tell your dentist you are receiving this medicine. Do not become pregnant while taking this medicine or for 5 months after stopping it. Talk with your doctor or health care professional about your birth control options while taking this medicine. Women should inform their doctor if they wish to become  pregnant or think they might be pregnant. There is a potential for serious side effects to an unborn child. Talk to your health care professional or pharmacist for more information. What side effects may I notice from receiving this medicine? Side effects that you should report to your doctor or health care professional as soon as possible:  allergic reactions like skin rash, itching or hives, swelling of the face, lips, or tongue  bone pain  breathing problems  dizziness  jaw pain, especially after dental work  redness, blistering, peeling of the skin  signs and symptoms of infection like fever or chills; cough; sore throat; pain or trouble passing urine  signs of low calcium like fast heartbeat, muscle cramps or muscle pain; pain, tingling, numbness in the hands or feet; seizures  unusual bleeding or bruising  unusually weak or tired Side effects that usually do not require medical attention (report to your doctor or health care professional if they continue or are bothersome):  constipation  diarrhea  headache  joint pain  loss of appetite  muscle pain  runny nose  tiredness  upset stomach This list may not describe all possible side effects. Call your doctor for medical advice about side effects. You may report side effects to FDA at 1-800-FDA-1088. Where should I keep my medicine? This medicine is only given in a clinic, doctor's office, or other health care setting and will not be stored at home. NOTE: This sheet is a summary. It may not cover all possible information. If you have questions about this medicine, talk to your doctor, pharmacist, or health care provider.  2020 Elsevier/Gold Standard (2017-05-26 16:10:44)  _______________________________________________________________  Thank you for choosing [facility] to provide your oncology and hematology care.  To afford each patient quality time with our providers,  please arrive at least 15 minutes before your scheduled appointment.  You need to re-schedule your appointment if you arrive 10 or more minutes late.  We strive to give you quality time with our providers, and arriving late affects you and other patients whose appointments are after yours.  Also, if you no show three or more times for appointments you may be dismissed from the clinic.  Again, thank you for choosing [facility]. Our hope is that these requests will allow you access to exceptional care and in a timely manner. _______________________________________________________________  If you have questions after your visit, please contact our office at [phone] between the hours of 8:30 a.m. and 5:00 p.m. Voicemails left after 4:30 p.m. will not be returned until the following business day. _______________________________________________________________  For prescription refill requests, have your pharmacy contact our office. _______________________________________________________________  Recommendations made by the consultant and any test results will be sent to your referring physician. _______________________________________________________________ 

## 2019-09-26 NOTE — Progress Notes (Signed)
Subjective:    Patient ID: Krista Doyle, female    DOB: November 04, 1960, 59 y.o.   MRN: [id number]  HPI: Krista Doyle is a 59 y.o. female who returns for follow up appointment for chronic pain and medication refill. She states her pain is located in her mid- lower back pain radiating into her right hip and right lower extremity with increase frequency of tingling and burning, she states she is compliant with her Lyrica. Also reports increase intensity of lower back pain and only receiving abot 4- 6 hours of relief of her pain with the oxycodone. She is scheduled for MRI on Monday 09/30/2019, and we will increase her Oxycodone to every 6 hours, she verbalizes understanding. She was instructed to send a My-Chart message with an update on medication change in two weeks, she verbalizes understanding. She rates her pain 9. Her current exercise regime is walking and performing stretching exercises.  Krista Doyle Morphine equivalent is 73.80  MME.  Last Oral Swab was Performed on 09/10/2019, it was consistent.    Pain Inventory Average Pain 10 Pain Right Now 9 My pain is sharp and stabbing  In the last 24 hours, has pain interfered with the following? General activity 10 Relation with others 10 Enjoyment of life 10 What TIME of day is your pain at its worst? daytime and night Sleep (in general) Poor  Pain is worse with: walking, bending, sitting and standing Pain improves with: rest, pacing activities and medication Relief from Meds: 8  Family History  Problem Relation Age of Onset  . Colon cancer Neg Hx    Social History   Socioeconomic History  . Marital status: Married    Spouse name: Not on file  . Number of children: Not on file  . Years of education: Not on file  . Highest education level: Not on file  Occupational History  . Occupation: Disabled  Tobacco Use  . Smoking status: Never Smoker  . Smokeless tobacco: Never Used  Substance and Sexual Activity  . Alcohol use: No     Alcohol/week: 0.0 standard drinks  . Drug use: No  . Sexual activity: Yes  Other Topics Concern  . Not on file  Social History Narrative  . Not on file   Social Determinants of Health   Financial Resource Strain:   . Difficulty of Paying Living Expenses: Not on file  Food Insecurity:   . Worried About Charity fundraiser in the Last Year: Not on file  . Ran Out of Food in the Last Year: Not on file  Transportation Needs:   . Lack of Transportation (Medical): Not on file  . Lack of Transportation (Non-Medical): Not on file  Physical Activity:   . Days of Exercise per Week: Not on file  . Minutes of Exercise per Session: Not on file  Stress:   . Feeling of Stress : Not on file  Social Connections:   . Frequency of Communication with Friends and Family: Not on file  . Frequency of Social Gatherings with Friends and Family: Not on file  . Attends Religious Services: Not on file  . Active Member of Clubs or Organizations: Not on file  . Attends Archivist Meetings: Not on file  . Marital Status: Not on file   Past Surgical History:  Procedure Laterality Date  . BACK SURGERY    . BREAST LUMPECTOMY    . COLONOSCOPY     about 3 yrs ago in [place]  .  KNEE SURGERY     right  . TONSILLECTOMY     Past Surgical History:  Procedure Laterality Date  . BACK SURGERY    . BREAST LUMPECTOMY    . COLONOSCOPY     about 3 yrs ago in [place]  . KNEE SURGERY     right  . TONSILLECTOMY     Past Medical History:  Diagnosis Date  . Asthma   . Back pain   . Breast cancer ([place]) 2008   left  . Diabetes ([place])   . Hypercholesterolemia   . Hypertension   . 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 Personal history of chemotherapy   . Personal history of radiation therapy 2008   BP 131/85   Pulse (!) 109   Temp 98.6 F (37 C)   Ht 5\' 3"  (1.6 m)   Wt 123 lb (55.8 kg)   SpO2 97%   BMI 21.79 kg/m   Opioid Risk Score:   Fall Risk Score:  `1  Depression screen  PHQ 2/9  Depression screen [facility] 2/9 01/03/2019 12/11/2017 11/07/2017 04/21/2017 01/13/2017 05/27/2016 03/28/2016  Decreased Interest 0 0 0 - 0 0 0  Down, Depressed, Hopeless 0 0 0 - 0 0 0  PHQ - 2 Score 0 0 0 - 0 0 0  Altered sleeping - - - 0 - - -  Tired, decreased energy - - - 0 - - -  Change in appetite - - - - - - -  Feeling bad or failure about yourself  - - - - - - -  Trouble concentrating - - - - - - -  Moving slowly or fidgety/restless - - - - - - -  Suicidal thoughts - - - - - - -  PHQ-9 Score - - - - - - -  Difficult doing work/chores - - - - - - -     Review of Systems  Musculoskeletal: Positive for back pain and gait problem.  Neurological: Positive for weakness.       Tingling  All other systems reviewed and are negative.      Objective:   Physical Exam Vitals and nursing note reviewed.  Constitutional:      Appearance: Normal appearance.  Cardiovascular:     Rate and Rhythm: Normal rate and regular rhythm.     Pulses: Normal pulses.     Heart sounds: Normal heart sounds.  Pulmonary:     Effort: Pulmonary effort is normal.     Breath sounds: Normal breath sounds.  Musculoskeletal:     Cervical back: Normal range of motion and neck supple.     Comments: Normal Muscle Bulk and Muscle Testing Reveals:  Upper Extremities: Right: Full ROM and Muscle Strength 4/5 Left Upper Extremity: Decreased ROM 90 Degrees and Muscle Strength 4/5  Thoracic Hypersensitivity: T-7-T-9 Lumbar Hypersensitivity Lower Extremities : Right Lower Extremity: Decreased ROM and Muscle Strength 5/5 Right Lower Extremity Flexion Produces Pain into her Lumbar, Right Hip and Right Lower Extremity Left Lower Extremity: Full ROM and Muscle Strength 5/5 Arises from Table Slowly,  Using walker: She was Transferred to wheelchair due to Antalgic Gait   Skin:    General: Skin is warm and dry.  Neurological:     Mental Status: She is alert and oriented to person, place, and time.  Psychiatric:         Mood and Affect: Mood normal.        Behavior: Behavior normal.  Assessment & Plan:  1. Acute Exacerbation of Chronic Low Back Pain: She's scheduled for MRI on 09/30/2019. Orthopedics Following.  2. Chronic low back pain, Lumbar Facet Arthropathy,lumbar spondylosis, spondylolisthesis, with lumbar radiculopathy.Continuecurrentmedication regimen:09/26/2019 Refilled: Fentanyl 12 mcg one patch every three days #10 and Oxycodone 10/325 mg one tablet every 8 hours as needed for pain #90.  We will continue the opioid monitoring program, this consists of regular clinic visits, examinations, urine drug screen, pill counts as well as use of [place] Controlled Substance Reporting system. A 12 month History has been reviewed on the [place] Controlled Substance Reporting System on 09/26/2019. 3. Chemotherapy induced peripheral neuropathy:ContinueLyrica.09/26/2019 4. Chronic Midline Thoracic Pain: Continue HEP and Current Medication Regime.09/26/2019 5. Muscle Spasm: Continuemedication regimen withRobaxin08/26/2021 6. Right Knee Pain:No complaints today.Continue Current Medication Regime: Continue HEP as ToleratedandContinue to Monitor.09/26/2019 7.RightGreater Trochanteric Bursitis:Continue to Alternate Ice and Heat Therapy.Continue to Monitor.09/26/2019 8. AcuteRightHip Pain: Ms. Parish states ortho following: She's scheduled for MRI, awaiting results. 9. Chronic Pain Syndrome: ContinueAmitriptyline, Fentanyly andOxycodone. Continue to Monitor.09/26/2019  F/U in 1 month   75minutes of face to face patient care time was spent during this visit. All questions were encouraged and answered.

## 2019-09-26 NOTE — Progress Notes (Signed)
Krista Doyle, [place] [address]   CLINIC:  Medical Oncology/Hematology  PCP:  [name], MD [place] [address] [phone]   REASON FOR VISIT: Follow-up for breast cancer   CURRENT THERAPY: Surveillance   INTERVAL HISTORY:  Krista Doyle 59 y.o. female returns for routine follow-up for breast cancer.  Patient reports she has increasing back and hip pain.  She also is more fatigued than normal.  She denies any bright red bleeding per rectum or melena. Denies any nausea, vomiting, or diarrhea. Denies any new pains. Had not noticed any recent bleeding such as epistaxis, hematuria or hematochezia. Denies recent chest pain on exertion, shortness of breath on minimal exertion, pre-syncopal episodes, or palpitations. Denies any numbness or tingling in hands or feet. Denies any recent fevers, infections, or recent hospitalizations. Patient reports appetite at 0% and energy level at 75%.     REVIEW OF SYSTEMS:  Review of Systems  Constitutional: Positive for fatigue.  Neurological: Positive for numbness.  Psychiatric/Behavioral: Positive for sleep disturbance.  All other systems reviewed and are negative.    PAST MEDICAL/SURGICAL HISTORY:  Past Medical History:  Diagnosis Date  . Asthma   . Back pain   . Breast cancer ([facility]) 2008   left  . Diabetes ([facility])   . Hypercholesterolemia   . Hypertension   . 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 Personal history of chemotherapy   . Personal history of radiation therapy 2008   Past Surgical History:  Procedure Laterality Date  . BACK SURGERY    . BREAST LUMPECTOMY    . COLONOSCOPY     about 3 yrs ago in [place]  . KNEE SURGERY     right  . TONSILLECTOMY       SOCIAL HISTORY:  Social History   Socioeconomic History  . Marital status: Married    Spouse name: Not on file  . Number of children: Not on file  . Years of education: Not on file  . Highest  education level: Not on file  Occupational History  . Occupation: Disabled  Tobacco Use  . Smoking status: Never Smoker  . Smokeless tobacco: Never Used  Substance and Sexual Activity  . Alcohol use: No    Alcohol/week: 0.0 standard drinks  . Drug use: No  . Sexual activity: Yes  Other Topics Concern  . Not on file  Social History Narrative  . Not on file   Social Determinants of Health   Financial Resource Strain:   . Difficulty of Paying Living Expenses: Not on file  Food Insecurity:   . Worried About Charity fundraiser in the Last Year: Not on file  . Ran Out of Food in the Last Year: Not on file  Transportation Needs:   . Lack of Transportation (Medical): Not on file  . Lack of Transportation (Non-Medical): Not on file  Physical Activity:   . Days of Exercise per Week: Not on file  . Minutes of Exercise per Session: Not on file  Stress:   . Feeling of Stress : Not on file  Social Connections:   . Frequency of Communication with Friends and Family: Not on file  . Frequency of Social Gatherings with Friends and Family: Not on file  . Attends Religious Services: Not on file  . Active Member of Clubs or Organizations: Not on file  . Attends Archivist Meetings: Not on file  . Marital  Status: Not on file  Intimate Partner Violence:   . Fear of Current or Ex-Partner: Not on file  . Emotionally Abused: Not on file  . Physically Abused: Not on file  . Sexually Abused: Not on file    FAMILY HISTORY:  Family History  Problem Relation Age of Onset  . Colon cancer Neg Hx     CURRENT MEDICATIONS:  Outpatient Encounter Medications as of 09/26/2019  Medication Sig Note  . albuterol (PROVENTIL HFA;VENTOLIN HFA) 108 (90 Base) MCG/ACT inhaler Inhale 1 puff into the lungs every 6 (six) hours as needed for wheezing or shortness of breath.   Marland Kitchen amitriptyline (ELAVIL) 10 MG tablet 1-2 tablets at bedtime every day   . atorvastatin (LIPITOR) 20 MG tablet Take 20 mg by  mouth daily. 01/28/2016: taking  . Blood Glucose Monitoring Suppl (ONETOUCH VERIO REFLECT) w/Device KIT USE TO TEST BLOOD SUGAR 2 TIMES A DAY   . budesonide-formoterol (SYMBICORT) 160-4.5 MCG/ACT inhaler Inhale 2 puffs into the lungs 2 (two) times daily.   . calcium carbonate (OS-CAL - DOSED IN MG OF ELEMENTAL CALCIUM) 1250 (500 Ca) MG tablet Take 1 tablet by mouth daily with breakfast.    . ergocalciferol (VITAMIN D2) 1.25 MG (50000 UT) capsule Take 1 capsule (50,000 Units total) by mouth once a week.   . fentaNYL (DURAGESIC) 12 MCG/HR Place 1 patch onto the skin every 3 (three) days.   . insulin glargine (LANTUS) 100 UNIT/ML injection Inject 24 Units into the skin at bedtime.   . Lancets (ONETOUCH DELICA PLUS [id number]) MISC USE TO TEST BLOOD SUGAR 2 TIMES A DAY   . LANTUS SOLOSTAR 100 UNIT/ML Solostar Pen SMARTSIG:24 Unit(s) SUB-Q Every Night   . letrozole (FEMARA) 2.5 MG tablet Take 1 tablet (2.5 mg total) by mouth daily.   Marland Kitchen loratadine (CLARITIN) 10 MG tablet Take 10 mg by mouth daily as needed for allergies.   Marland Kitchen losartan (COZAAR) 100 MG tablet Take 100 mg daily by mouth.   . meclizine (ANTIVERT) 25 MG tablet Take 25 mg by mouth 2 (two) times daily as needed.   . methocarbamol (ROBAXIN) 500 MG tablet TAKE 1 TABLET BY MOUTH EVERY 6 HOURS AS NEEDED FOR MUSCLE SPASMS.   . Multiple Vitamins-Minerals (ICAPS AREDS 2) CAPS Take by mouth.   . naloxegol oxalate (MOVANTIK) 25 MG TABS tablet Take 1 tablet (25 mg total) by mouth daily.   Glory Rosebush VERIO test strip USE TO TEST BLOOD SUGAR 2 TIMES A DAY   . oxyCODONE-acetaminophen (PERCOCET) 10-325 MG tablet Take 1 tablet by mouth every 6 (six) hours as needed for pain.   . pregabalin (LYRICA) 100 MG capsule Take 1 capsule (100 mg total) by mouth 2 (two) times daily.   . sitaGLIPtin (JANUVIA) 100 MG tablet Take 100 mg by mouth daily.   . valsartan (DIOVAN) 160 MG tablet Take 160 mg by mouth daily.    No facility-administered encounter medications on  file as of 09/26/2019.    ALLERGIES:  Allergies  Allergen Reactions  . Other     Cat/dog dander  . Pollen Extract      PHYSICAL EXAM:  ECOG Performance status: 1  Vital signs: BP 119/75 Pulse 116 Respirations 16 Temp 98.6 O2 saturation of 100%   Physical Exam Constitutional:      Appearance: Normal appearance. She is normal weight.  Cardiovascular:     Rate and Rhythm: Normal rate and regular rhythm.     Heart sounds: Normal heart sounds.  Pulmonary:  Effort: Pulmonary effort is normal.     Breath sounds: Normal breath sounds.  Abdominal:     General: Bowel sounds are normal.     Palpations: Abdomen is soft.  Musculoskeletal:        General: Normal range of motion.  Skin:    General: Skin is warm.  Neurological:     Mental Status: She is alert and oriented to person, place, and time. Mental status is at baseline.  Psychiatric:        Mood and Affect: Mood normal.        Behavior: Behavior normal.        Thought Content: Thought content normal.        Judgment: Judgment normal.      LABORATORY DATA:  I have reviewed the labs as listed.  CBC    Component Value Date/Time   WBC 7.4 09/20/2019 1118   RBC 4.12 09/26/2019 1415   RBC 3.80 (L) 09/20/2019 1118   HGB 9.5 (L) 09/20/2019 1118   HCT 31.7 (L) 09/20/2019 1118   PLT 350 09/20/2019 1118   MCV 83.4 09/20/2019 1118   MCH 25.0 (L) 09/20/2019 1118   MCHC 30.0 09/20/2019 1118   RDW 16.6 (H) 09/20/2019 1118   LYMPHSABS 2.9 09/20/2019 1118   MONOABS 0.6 09/20/2019 1118   EOSABS 0.3 09/20/2019 1118   BASOSABS 0.0 09/20/2019 1118   CMP Latest Ref Rng & Units 09/20/2019 09/10/2019 03/27/2019  Glucose 70 - 99 mg/dL 131(H) 160(H) 209(H)  BUN 6 - 20 mg/dL _0 Creatinine 0.44 - 1.00 mg/dL 0.81 1.06(H) 0.89  Sodium 135 - 145 mmol/L 141 139 134(L)  Potassium 3.5 - 5.1 mmol/L 3.2(L) 4.2 3.9  Chloride 98 - 111 mmol/L 102 100 95(L)  CO2 22 - 32 mmol/L 29 33(H) 28  Calcium 8.9 - 10.3 mg/dL 10.0 10.3 9.9   Total Protein 6.5 - 8.1 g/dL 8.0 7.5 8.7(H)  Total Bilirubin 0.3 - 1.2 mg/dL 0.2(L) 0.2 0.5  Alkaline Phos 38 - 126 U/L 157(H) - 75  AST 15 - 41 U/L 31 29 36  ALT 0 - 44 U/L _1 All questions were answered to patient's stated satisfaction. Encouraged patient to call with any new concerns or questions before his next visit to the cancer center and we can certain see him sooner, if needed.      ASSESSMENT & PLAN:  Breast cancer of upper-outer quadrant of left female breast ([place]) 1.  Left breast cancer: - She was diagnosed with breast cancer originally in 2008.  ER+, PR+, HER-2+ -She reportedly completed 1 year of Herceptin. - She took tamoxifen for 5 years.  She was previously followed by Dr. Talbert Cage. - She completed 5 years of Femara in 2018.  She is planning to stay on Femara for additional 5 years which will be complete 2023. -Last mammogram done on 03/14/2018 was BI-RADS Category 1 negative. -Patient reports she does have mild lymphedema in her left arm.  She uses her machine twice daily and wears a sleeve both which help. - Physical assessment today did not reveal any palpable masses or adenopathy. -Last mammogram done on 03/27/2019 showed BI-RADS Category 0 incomplete.  Showed possible right breast mass. -Mammogram repeated on 04/09/2019 showed BI-RADS Category 2 benign -Labs done on 09/26/2019 showed a decrease in hemoglobin from 11.2 to 9.5, alkaline phosphatase 157, LFTs WNL, ferritin 787, saturation 13, reticulocytes normal.  SPEP and nutritional deficiencies pending -She reports she is having  back and hip pain.  We will order a bone scan and CT CAP.  She is having an MRI for her back pain on Monday.  We will see her back after her results  2.  Back pain: -Patient has chronic back pain. -She had an MRI of the spine done on 10/17/2014 that showed spinal stenosis. -She had a bone scan done on 05/10/2017 showed question of compression fractures. - She underwent a thoracic x-ray as  well as sternal x-rays on 09/20/2017 that showed DJD and osteopenia. -She is being followed by pain management and may need an orthopedic evaluation if symptoms ongoing. - Patient was previously followed by Dr. Walden Field.  She did an SPEP which was negative. -She is having an MRI done on Monday.  3.  Osteopenia: -Her bone density test done on 03/27/2019 showed a T score of -1.0 osteopenia. -She is being treated with Prolia every 6 months. - She will continue taking calcium and vitamin D daily. -She will get her Prolia injection today.      Orders placed this encounter:  Orders Placed This Encounter  Procedures  . MM 3D SCREEN BREAST BILATERAL  . NM Bone Scan Whole Body  . CT CHEST W CONTRAST  . CT ABDOMEN PELVIS W CONTRAST  . Ferritin  . Iron and TIBC  . Protein electrophoresis, serum  . Copper, serum  . Haptoglobin  . Folate  . Reticulocytes  . Methylmalonic acid, serum  . CBC with Differential/Platelet  . Comprehensive metabolic panel  . Ferritin  . Iron and TIBC  . Lactate dehydrogenase  . Vitamin B12  . VITAMIN D 25 Hydroxy (Vit-D Deficiency, Fractures)      Francene Finders, FNP-C [place] [phone]

## 2019-09-27 LAB — PROTEIN ELECTROPHORESIS, SERUM
A/G Ratio: 0.6 — ABNORMAL LOW (ref 0.7–1.7)
Albumin ELP: 3.1 g/dL (ref 2.9–4.4)
Alpha-1-Globulin: 0.5 g/dL — ABNORMAL HIGH (ref 0.0–0.4)
Alpha-2-Globulin: 1.1 g/dL — ABNORMAL HIGH (ref 0.4–1.0)
Beta Globulin: 1.3 g/dL (ref 0.7–1.3)
Gamma Globulin: 2.1 g/dL — ABNORMAL HIGH (ref 0.4–1.8)
Globulin, Total: 5 g/dL — ABNORMAL HIGH (ref 2.2–3.9)
Total Protein ELP: 8.1 g/dL (ref 6.0–8.5)

## 2019-09-27 LAB — HAPTOGLOBIN: Haptoglobin: 421 mg/dL — ABNORMAL HIGH (ref 33–346)

## 2019-09-29 ENCOUNTER — Encounter: Payer: Self-pay | Admitting: Registered Nurse

## 2019-09-30 ENCOUNTER — Telehealth: Payer: Self-pay | Admitting: Registered Nurse

## 2019-09-30 ENCOUNTER — Ambulatory Visit (HOSPITAL_COMMUNITY): Payer: BC Managed Care – PPO

## 2019-09-30 NOTE — Telephone Encounter (Signed)
Placed a call to [name], she reports increase intensity of neuropathic, we will increase her Lyrica 100 mg  to three times a day, when she was on Lyrica 200 mg BID, she reported increased somnolence,she was instructed to call or send a My-Chart message in two weeks with an update, she verbalizes understanding.

## 2019-10-01 ENCOUNTER — Encounter: Payer: Self-pay | Admitting: *Deleted

## 2019-10-01 LAB — METHYLMALONIC ACID, SERUM: Methylmalonic Acid, Quantitative: 180 nmol/L (ref 0–378)

## 2019-10-02 LAB — COPPER, SERUM: Copper: 280 ug/dL — ABNORMAL HIGH (ref 80–158)

## 2019-10-09 ENCOUNTER — Encounter (HOSPITAL_COMMUNITY): Payer: BC Managed Care – PPO

## 2019-10-09 ENCOUNTER — Ambulatory Visit: Payer: Medicare Other | Admitting: Registered Nurse

## 2019-10-11 ENCOUNTER — Ambulatory Visit (HOSPITAL_COMMUNITY): Payer: BC Managed Care – PPO

## 2019-10-23 ENCOUNTER — Ambulatory Visit (HOSPITAL_COMMUNITY): Admission: RE | Admit: 2019-10-23 | Payer: BC Managed Care – PPO | Source: Ambulatory Visit

## 2019-10-24 ENCOUNTER — Other Ambulatory Visit: Payer: Self-pay

## 2019-10-24 ENCOUNTER — Ambulatory Visit (HOSPITAL_COMMUNITY)
Admission: RE | Admit: 2019-10-24 | Discharge: 2019-10-24 | Disposition: A | Discharge status: Home / Self Care | Payer: BC Managed Care – PPO | Source: Ambulatory Visit | Attending: Orthopedic Surgery | Admitting: Orthopedic Surgery

## 2019-10-24 DIAGNOSIS — M5116 Intervertebral disc disorders with radiculopathy, lumbar region: Secondary | ICD-10-CM | POA: Diagnosis not present

## 2019-10-24 DIAGNOSIS — M5410 Radiculopathy, site unspecified: Secondary | ICD-10-CM | POA: Diagnosis not present

## 2019-10-24 DIAGNOSIS — M545 Low back pain, unspecified: Secondary | ICD-10-CM

## 2019-10-24 DIAGNOSIS — M5117 Intervertebral disc disorders with radiculopathy, lumbosacral region: Secondary | ICD-10-CM | POA: Diagnosis not present

## 2019-10-24 DIAGNOSIS — M4727 Other spondylosis with radiculopathy, lumbosacral region: Secondary | ICD-10-CM | POA: Diagnosis not present

## 2019-10-24 DIAGNOSIS — M4726 Other spondylosis with radiculopathy, lumbar region: Secondary | ICD-10-CM | POA: Diagnosis not present

## 2019-10-24 MED ORDER — GADOBUTROL 1 MMOL/ML IV SOLN
5.0000 mL | Freq: Once | INTRAVENOUS | Status: AC | PRN
  Administered 2019-10-24: 5 mL via INTRAVENOUS

## 2019-10-28 ENCOUNTER — Ambulatory Visit (HOSPITAL_COMMUNITY): Payer: Medicare Other | Admitting: Hematology

## 2019-10-29 DIAGNOSIS — E119 Type 2 diabetes mellitus without complications: Secondary | ICD-10-CM | POA: Diagnosis not present

## 2019-10-29 DIAGNOSIS — R110 Nausea: Secondary | ICD-10-CM | POA: Diagnosis not present

## 2019-10-29 DIAGNOSIS — R52 Pain, unspecified: Secondary | ICD-10-CM | POA: Diagnosis not present

## 2019-10-29 DIAGNOSIS — R634 Abnormal weight loss: Secondary | ICD-10-CM | POA: Diagnosis not present

## 2019-10-31 ENCOUNTER — Encounter: Payer: BC Managed Care – PPO | Attending: Physical Medicine & Rehabilitation | Admitting: Registered Nurse

## 2019-10-31 ENCOUNTER — Encounter: Payer: Self-pay | Admitting: Registered Nurse

## 2019-10-31 ENCOUNTER — Other Ambulatory Visit: Payer: Self-pay

## 2019-10-31 VITALS — BP 104/72 | HR 124 | Temp 98.8°F | Ht 63.0 in | Wt 111.0 lb

## 2019-10-31 DIAGNOSIS — M545 Low back pain: Secondary | ICD-10-CM | POA: Diagnosis not present

## 2019-10-31 DIAGNOSIS — R000 Tachycardia, unspecified: Secondary | ICD-10-CM

## 2019-10-31 DIAGNOSIS — R630 Anorexia: Secondary | ICD-10-CM

## 2019-10-31 DIAGNOSIS — G620 Drug-induced polyneuropathy: Secondary | ICD-10-CM

## 2019-10-31 DIAGNOSIS — Z853 Personal history of malignant neoplasm of breast: Secondary | ICD-10-CM | POA: Diagnosis not present

## 2019-10-31 DIAGNOSIS — M47816 Spondylosis without myelopathy or radiculopathy, lumbar region: Secondary | ICD-10-CM | POA: Insufficient documentation

## 2019-10-31 DIAGNOSIS — M546 Pain in thoracic spine: Secondary | ICD-10-CM

## 2019-10-31 DIAGNOSIS — M62838 Other muscle spasm: Secondary | ICD-10-CM | POA: Diagnosis not present

## 2019-10-31 DIAGNOSIS — Z9221 Personal history of antineoplastic chemotherapy: Secondary | ICD-10-CM | POA: Insufficient documentation

## 2019-10-31 DIAGNOSIS — M7061 Trochanteric bursitis, right hip: Secondary | ICD-10-CM

## 2019-10-31 DIAGNOSIS — G8929 Other chronic pain: Secondary | ICD-10-CM

## 2019-10-31 DIAGNOSIS — I890 Lymphedema, not elsewhere classified: Secondary | ICD-10-CM | POA: Insufficient documentation

## 2019-10-31 DIAGNOSIS — R54 Age-related physical debility: Secondary | ICD-10-CM

## 2019-10-31 DIAGNOSIS — Z5181 Encounter for therapeutic drug level monitoring: Secondary | ICD-10-CM | POA: Diagnosis not present

## 2019-10-31 DIAGNOSIS — G894 Chronic pain syndrome: Secondary | ICD-10-CM | POA: Insufficient documentation

## 2019-10-31 DIAGNOSIS — T451X5A Adverse effect of antineoplastic and immunosuppressive drugs, initial encounter: Secondary | ICD-10-CM | POA: Insufficient documentation

## 2019-10-31 DIAGNOSIS — R634 Abnormal weight loss: Secondary | ICD-10-CM

## 2019-10-31 DIAGNOSIS — M25552 Pain in left hip: Secondary | ICD-10-CM | POA: Diagnosis not present

## 2019-10-31 DIAGNOSIS — Z79891 Long term (current) use of opiate analgesic: Secondary | ICD-10-CM | POA: Diagnosis not present

## 2019-10-31 DIAGNOSIS — M5416 Radiculopathy, lumbar region: Secondary | ICD-10-CM | POA: Insufficient documentation

## 2019-10-31 MED ORDER — PREGABALIN 100 MG PO CAPS
100.0000 mg | ORAL_CAPSULE | Freq: Two times a day (BID) | ORAL | 3 refills | Status: DC
Start: 2019-10-31 — End: 2019-12-05

## 2019-10-31 MED ORDER — OXYCODONE-ACETAMINOPHEN 10-325 MG PO TABS: 1.0000 | ORAL_TABLET | Freq: Four times a day (QID) | ORAL | 0 refills | Status: DC | PRN

## 2019-10-31 MED ORDER — FENTANYL 12 MCG/HR TD PT72: 1.0000 | MEDICATED_PATCH | TRANSDERMAL | 0 refills | Status: DC

## 2019-10-31 NOTE — Progress Notes (Signed)
Subjective:    Patient ID: Krista Doyle, female    DOB: 11/20/1960, 59 y.o.   MRN: [id number]  HPI: Krista Doyle is a 59 y.o. female who returns for follow up appointment for chronic pain and medication refill. She states her  pain is located in her mid- lower back pain radiating into her right hip and right lower extremity. Krista Doyle reports some relief with change in her Oxycodone frequency last month, she states she's still experiencing increase intensity of lower back pain, she had a MRI, awaiting her results. She was encouraged to call her orthopedist, she verbalizes understanding. She rates her pain 7. Her current exercise regime is walking short distances in her home with walker.  Krista Doyle reports she's having nausea and seen her PCP yesterday and was prescribed Zofran. Also states she's experiencing loss of appetite, and was prescribed Megace. Krista Doyle has lost 12lbs in one month.   Krista Doyle Morphine equivalent is 88.80MME. Last Oral Swab was Performed on 09/10/2019, it was consistent.   Pain Inventory Average Pain 8 Pain Right Now 7 My pain is sharp, stabbing and aching  In the last 24 hours, has pain interfered with the following? General activity 8 Relation with others 7 Enjoyment of life 7 What TIME of day is your pain at its worst? evening and night Sleep (in general) Fair  Pain is worse with: walking, bending, sitting, standing and some activites Pain improves with: rest, heat/ice and medication Relief from Meds: 7  Family History  Problem Relation Age of Onset  . Colon cancer Neg Hx    Social History   Socioeconomic History  . Marital status: Married    Spouse name: Not on file  . Number of children: Not on file  . Years of education: Not on file  . Highest education level: Not on file  Occupational History  . Occupation: Disabled  Tobacco Use  . Smoking status: Never Smoker  . Smokeless tobacco: Never Used  Substance and Sexual Activity  . Alcohol  use: No    Alcohol/week: 0.0 standard drinks  . Drug use: No  . Sexual activity: Yes  Other Topics Concern  . Not on file  Social History Narrative  . Not on file   Social Determinants of Health   Financial Resource Strain:   . Difficulty of Paying Living Expenses: Not on file  Food Insecurity:   . Worried About Charity fundraiser in the Last Year: Not on file  . Ran Out of Food in the Last Year: Not on file  Transportation Needs:   . Lack of Transportation (Medical): Not on file  . Lack of Transportation (Non-Medical): Not on file  Physical Activity:   . Days of Exercise per Week: Not on file  . Minutes of Exercise per Session: Not on file  Stress:   . Feeling of Stress : Not on file  Social Connections:   . Frequency of Communication with Friends and Family: Not on file  . Frequency of Social Gatherings with Friends and Family: Not on file  . Attends Religious Services: Not on file  . Active Member of Clubs or Organizations: Not on file  . Attends Archivist Meetings: Not on file  . Marital Status: Not on file   Past Surgical History:  Procedure Laterality Date  . BACK SURGERY    . BREAST LUMPECTOMY    . COLONOSCOPY     about 3 yrs ago in [place]  .  KNEE SURGERY     right  . TONSILLECTOMY     Past Surgical History:  Procedure Laterality Date  . BACK SURGERY    . BREAST LUMPECTOMY    . COLONOSCOPY     about 3 yrs ago in [place]  . KNEE SURGERY     right  . TONSILLECTOMY     Past Medical History:  Diagnosis Date  . Asthma   . Back pain   . Breast cancer ([facility]) 2008   left  . Diabetes ([facility])   . Hypercholesterolemia   . Hypertension   . 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 Personal history of chemotherapy   . Personal history of radiation therapy 2008   BP 99/69   Pulse (!) 133   Temp 98.8 F (37.1 C)   Ht 5\' 3"  (1.6 m)   Wt 111 lb (50.3 kg)   SpO2 97%   BMI 19.66 kg/m   Opioid Risk Score:   Fall Risk Score:   `1  Depression screen PHQ 2/9  Depression screen [facility] 2/9 01/03/2019 12/11/2017 11/07/2017 04/21/2017 01/13/2017 05/27/2016 03/28/2016  Decreased Interest 0 0 0 - 0 0 0  Down, Depressed, Hopeless 0 0 0 - 0 0 0  PHQ - 2 Score 0 0 0 - 0 0 0  Altered sleeping - - - 0 - - -  Tired, decreased energy - - - 0 - - -  Change in appetite - - - - - - -  Feeling bad or failure about yourself  - - - - - - -  Trouble concentrating - - - - - - -  Moving slowly or fidgety/restless - - - - - - -  Suicidal thoughts - - - - - - -  PHQ-9 Score - - - - - - -  Difficult doing work/chores - - - - - - -     Review of Systems     Objective:   Physical Exam        Assessment & Plan:  1. Acute Exacerbation of Chronic Low Back Pain:  Orthopedics Following. S/P MRI. 10/31/2019. 2. Chronic low back pain, Lumbar Facet Arthropathy,lumbar spondylosis, spondylolisthesis, with lumbar radiculopathy.Continuecurrentmedication regimen:10/31/2019 Refilled: Fentanyl 12 mcg one patch every three days #10 and Oxycodone 10/325 mg one tablet every 6 hours as needed for pain #120.  We will continue the opioid monitoring program, this consists of regular clinic visits, examinations, urine drug screen, pill counts as well as use of [place] Controlled Substance Reporting system. A 12 month History has been reviewed on the [place] Controlled Substance Reporting System on 10/31/2019. 3. Chemotherapy induced peripheral neuropathy:ContinueLyrica.10/31/2019 4. Chronic Midline Thoracic Pain: Continue HEP and Current Medication Regime.10/31/2019 5. Muscle Spasm: Continuemedication regimen withRobaxin09/30/2021 6. Right Knee Pain:No complaints today.Continue Current Medication Regime: Continue HEP as ToleratedandContinue to Monitor.10/31/2019 7.RightGreater Trochanteric Bursitis:Continue to Alternate Ice and Heat Therapy.Continue to Monitor.10/31/2019 8. AcuteRightHip Pain: Ms. evonda enge  following: 10/31/2019 9. Chronic Pain Syndrome: ContinueAmitriptyline, Fentanyly andOxycodone. Continue to Monitor.10/31/2019 10. Loss Appetite: PCP Following. Continue Megace.  11. Loss of Weight: Ms. Doyle reports she's drinking Nutritional Supplements. PCP Following.  12. Nausea: PCP Following. Continue Zofran.  13. Frailty: Encouraged to go to the ED, she refuses at this time. Spoke with her Husband. He verbalizes understanding.  14. Tachycardia: Apical Pulse checked: She was encouraged to go to ED for F/U,  She refuses at this time. Spoke with Mrs. Wroblewski regarding the above, he verbalizes understanding.  She was also was instructed to call her PCP regarding the above, she verbalizes understanding.  F/U in 1 month   74minutes of face to face patient care time was spent during this visit. All questions were encouraged and answered.

## 2019-11-01 ENCOUNTER — Emergency Department (HOSPITAL_COMMUNITY)
Admission: EM | Admit: 2019-11-01 | Discharge: 2019-11-02 | Disposition: A | Discharge status: Home / Self Care | Payer: BC Managed Care – PPO | Attending: Emergency Medicine | Admitting: Emergency Medicine

## 2019-11-01 ENCOUNTER — Other Ambulatory Visit: Payer: Self-pay

## 2019-11-01 ENCOUNTER — Encounter (HOSPITAL_COMMUNITY): Payer: Self-pay

## 2019-11-01 DIAGNOSIS — E1140 Type 2 diabetes mellitus with diabetic neuropathy, unspecified: Secondary | ICD-10-CM | POA: Insufficient documentation

## 2019-11-01 DIAGNOSIS — Z853 Personal history of malignant neoplasm of breast: Secondary | ICD-10-CM | POA: Diagnosis not present

## 2019-11-01 DIAGNOSIS — Z79899 Other long term (current) drug therapy: Secondary | ICD-10-CM | POA: Insufficient documentation

## 2019-11-01 DIAGNOSIS — N2889 Other specified disorders of kidney and ureter: Secondary | ICD-10-CM | POA: Diagnosis not present

## 2019-11-01 DIAGNOSIS — J45909 Unspecified asthma, uncomplicated: Secondary | ICD-10-CM | POA: Insufficient documentation

## 2019-11-01 DIAGNOSIS — I10 Essential (primary) hypertension: Secondary | ICD-10-CM | POA: Insufficient documentation

## 2019-11-01 DIAGNOSIS — Z20822 Contact with and (suspected) exposure to covid-19: Secondary | ICD-10-CM | POA: Diagnosis not present

## 2019-11-01 DIAGNOSIS — Z794 Long term (current) use of insulin: Secondary | ICD-10-CM | POA: Diagnosis not present

## 2019-11-01 DIAGNOSIS — R1012 Left upper quadrant pain: Secondary | ICD-10-CM

## 2019-11-01 DIAGNOSIS — C799 Secondary malignant neoplasm of unspecified site: Secondary | ICD-10-CM | POA: Diagnosis not present

## 2019-11-01 DIAGNOSIS — N390 Urinary tract infection, site not specified: Secondary | ICD-10-CM | POA: Diagnosis not present

## 2019-11-01 DIAGNOSIS — Z9012 Acquired absence of left breast and nipple: Secondary | ICD-10-CM | POA: Insufficient documentation

## 2019-11-01 DIAGNOSIS — K7689 Other specified diseases of liver: Secondary | ICD-10-CM | POA: Diagnosis not present

## 2019-11-01 DIAGNOSIS — I700 Atherosclerosis of aorta: Secondary | ICD-10-CM | POA: Diagnosis not present

## 2019-11-01 DIAGNOSIS — Z7951 Long term (current) use of inhaled steroids: Secondary | ICD-10-CM | POA: Insufficient documentation

## 2019-11-01 DIAGNOSIS — C7981 Secondary malignant neoplasm of breast: Secondary | ICD-10-CM | POA: Diagnosis not present

## 2019-11-01 LAB — COMPREHENSIVE METABOLIC PANEL
ALT: 16 U/L (ref 0–44)
AST: 47 U/L — ABNORMAL HIGH (ref 15–41)
Albumin: 2.9 g/dL — ABNORMAL LOW (ref 3.5–5.0)
Alkaline Phosphatase: 266 U/L — ABNORMAL HIGH (ref 38–126)
Anion gap: 15 (ref 5–15)
BUN: 13 mg/dL (ref 6–20)
CO2: 24 mmol/L (ref 22–32)
Calcium: 9.1 mg/dL (ref 8.9–10.3)
Chloride: 93 mmol/L — ABNORMAL LOW (ref 98–111)
Creatinine, Ser: 0.81 mg/dL (ref 0.44–1.00)
GFR calc Af Amer: >60 mL/min (ref >60)
GFR calc non Af Amer: >60 mL/min (ref >60)
Glucose, Bld: 279 mg/dL — ABNORMAL HIGH (ref 70–99)
Potassium: 3.4 mmol/L — ABNORMAL LOW (ref 3.5–5.1)
Sodium: 132 mmol/L — ABNORMAL LOW (ref 135–145)
Total Bilirubin: 0.5 mg/dL (ref 0.3–1.2)
Total Protein: 9 g/dL — ABNORMAL HIGH (ref 6.5–8.1)

## 2019-11-01 LAB — CBC
HCT: 34.1 % — ABNORMAL LOW (ref 36.0–46.0)
Hemoglobin: 10.5 g/dL — ABNORMAL LOW (ref 12.0–15.0)
MCH: 25.4 pg — ABNORMAL LOW (ref 26.0–34.0)
MCHC: 30.8 g/dL (ref 30.0–36.0)
MCV: 82.6 fL (ref 80.0–100.0)
Platelets: 416 10*3/uL — ABNORMAL HIGH (ref 150–400)
RBC: 4.13 MIL/uL (ref 3.87–5.11)
RDW: 18 % — ABNORMAL HIGH (ref 11.5–15.5)
WBC: 11.2 10*3/uL — ABNORMAL HIGH (ref 4.0–10.5)
nRBC: 0.3 % — ABNORMAL HIGH (ref 0.0–0.2)

## 2019-11-01 LAB — URINALYSIS, ROUTINE W REFLEX MICROSCOPIC
Bacteria, UA: NONE SEEN
Bilirubin Urine: NEGATIVE
Glucose, UA: NEGATIVE mg/dL
Hgb urine dipstick: NEGATIVE
Ketones, ur: NEGATIVE mg/dL
Leukocytes,Ua: NEGATIVE
Nitrite: NEGATIVE
Protein, ur: 30 mg/dL — AB
Specific Gravity, Urine: 1.027 (ref 1.005–1.030)
pH: 5 (ref 5.0–8.0)

## 2019-11-01 NOTE — ED Triage Notes (Signed)
Pt reports abd pain that has been going on for 3 weeks, pt was seen at PCP and given nausea and appetite medication, the vomiting has slowed down, but abd pain continues. Pt says she is worried about dehydration

## 2019-11-02 ENCOUNTER — Emergency Department (HOSPITAL_COMMUNITY): Payer: BC Managed Care – PPO

## 2019-11-02 DIAGNOSIS — I700 Atherosclerosis of aorta: Secondary | ICD-10-CM | POA: Diagnosis not present

## 2019-11-02 DIAGNOSIS — Z853 Personal history of malignant neoplasm of breast: Secondary | ICD-10-CM | POA: Diagnosis not present

## 2019-11-02 DIAGNOSIS — K7689 Other specified diseases of liver: Secondary | ICD-10-CM | POA: Diagnosis not present

## 2019-11-02 DIAGNOSIS — N2889 Other specified disorders of kidney and ureter: Secondary | ICD-10-CM | POA: Diagnosis not present

## 2019-11-02 LAB — RESP PANEL BY RT PCR (RSV, FLU A&B, COVID)
Influenza A by PCR: NEGATIVE
Influenza B by PCR: NEGATIVE
Respiratory Syncytial Virus by PCR: NEGATIVE
SARS Coronavirus 2 by RT PCR: NEGATIVE

## 2019-11-02 MED ORDER — FENTANYL CITRATE (PF) 100 MCG/2ML IJ SOLN
50.0000 ug | Freq: Once | INTRAMUSCULAR | Status: AC
  Administered 2019-11-02: 50 ug via INTRAVENOUS
  Filled 2019-11-02: qty 2

## 2019-11-02 MED ORDER — SODIUM CHLORIDE 0.9 % IV BOLUS
1000.0000 mL | Freq: Once | INTRAVENOUS | Status: AC
  Administered 2019-11-02: 1000 mL via INTRAVENOUS

## 2019-11-02 MED ORDER — CEPHALEXIN 500 MG PO CAPS: 500.0000 mg | ORAL_CAPSULE | Freq: Three times a day (TID) | ORAL | 0 refills | Status: DC

## 2019-11-02 MED ORDER — IOHEXOL 9 MG/ML PO SOLN
ORAL | Status: AC
  Filled 2019-11-02: qty 1000

## 2019-11-02 MED ORDER — IOHEXOL 300 MG/ML  SOLN
100.0000 mL | Freq: Once | INTRAMUSCULAR | Status: AC | PRN
  Administered 2019-11-02: 100 mL via INTRAVENOUS

## 2019-11-02 MED ORDER — SODIUM CHLORIDE 0.9 % IV BOLUS
500.0000 mL | Freq: Once | INTRAVENOUS | Status: AC
  Administered 2019-11-02: 500 mL via INTRAVENOUS

## 2019-11-02 MED ORDER — ONDANSETRON HCL 4 MG/2ML IJ SOLN
4.0000 mg | Freq: Once | INTRAMUSCULAR | Status: AC
  Administered 2019-11-02: 4 mg via INTRAVENOUS
  Filled 2019-11-02: qty 2

## 2019-11-02 NOTE — Discharge Instructions (Addendum)
Your CT scan tonight shows that there were multiple lesions in your liver suspicious for metastatic cancer, there are also lung nodules in your left lung concerning for metastatic cancer and you have multiple lesions in the bones of your spine that are also concerning for metastatic cancer.  Please follow-up with your oncology office on Monday so they could arrange to get a biopsy and see exactly what type of cancer this can be and what kind of treatment you will need to start.  Most likely it is going to be your breast cancer.

## 2019-11-02 NOTE — ED Provider Notes (Addendum)
[facility] EMERGENCY DEPARTMENT Provider Note   CSN: [id number] Arrival date & time: 11/01/19  1628   Time seen 12:01 AM  History Chief Complaint  Patient presents with  . Abdominal Pain    Krista Doyle is a 59 y.o. female.  HPI   Patient has breast cancer and and is followed by oncology.  She states she started having abdominal pain that has been there constantly for the past 3 weeks but waxes and wanes.  She states movement and eating makes it worse, laying still makes it feel better.  She was having nausea and vomiting 4-5 times a day however her doctor started her on Zofran 2 days ago and she has not had any vomiting since although the nausea continues.  She states when it first started out she was constipated and then she had diarrhea and now she is having what she considers normal bowel movements.  She states that are not hard, they are not balls and they are not soft.  She states she has been having low-grade temperatures up to 100 and the last time was last week.  She has been having chills for the past 3 weeks.  She has been having loss of appetite for the past few weeks and states food has been tasting funny for 2 weeks.  She reports weight loss and states that she has lost 15 pounds since August 23.  She states she has a mild cough and her chest hurts while she coughs for about 2 weeks.  She has had some mild rhinorrhea off and on and denies sore throat.  She denies being around anybody else who is sick.  She states she is never had abdominal pains before.  She denies feeling bloated.  She denies dysuria, frequency, or urgency.  Patient states she has taken the [place] vaccine  PCP [name], MD   Past Medical History:  Diagnosis Date  . Asthma   . Back pain   . Breast cancer ([place]) 2008   left  . Diabetes ([place])   . Hypercholesterolemia   . Hypertension   . 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 Personal history of chemotherapy   . Personal history of  radiation therapy 2008    Patient Active Problem List   Diagnosis Date Noted  . Lumbar facet arthropathy 10/07/2015  . 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 Constipation due to opioid therapy 05/08/2015  . Genetic testing 03/17/2015  . Osteopenia determined by x-ray 03/13/2015  . Lumbar spondylosis 02/11/2015  . Lumbar radiculopathy 02/11/2015  . Chemotherapy-induced peripheral neuropathy ([place]) 02/11/2015  . Vitamin D deficiency 02/02/2015  . Breast cancer of upper-outer quadrant of left female breast ([place]) 12/22/2014  . High risk medication use 12/22/2014    Past Surgical History:  Procedure Laterality Date  . BACK SURGERY    . BREAST LUMPECTOMY    . COLONOSCOPY     about 3 yrs ago in [place]  . KNEE SURGERY     right  . TONSILLECTOMY       OB History   No obstetric history on file.     Family History  Problem Relation Age of Onset  . Colon cancer Neg Hx     Social History   Tobacco Use  . Smoking status: Never Smoker  . Smokeless tobacco: Never Used  Substance Use Topics  . Alcohol use: No    Alcohol/week: 0.0 standard drinks  . Drug use: No  Home Medications Prior to Admission medications   Medication Sig Start Date End Date Taking? Authorizing Provider  albuterol (PROVENTIL HFA;VENTOLIN HFA) 108 (90 Base) MCG/ACT inhaler Inhale 1 puff into the lungs every 6 (six) hours as needed for wheezing or shortness of breath.    [provider]  amitriptyline (ELAVIL) 10 MG tablet 1-2 tablets at bedtime every day 04/08/19   Jones Bales, NP  atorvastatin (LIPITOR) 20 MG tablet Take 20 mg by mouth daily.    [provider]  Blood Glucose Monitoring Suppl (ONETOUCH VERIO REFLECT) w/Device KIT USE TO TEST BLOOD SUGAR 2 TIMES A DAY 01/19/19   [provider]  budesonide-formoterol (SYMBICORT) 160-4.5 MCG/ACT inhaler Inhale 2 puffs into the lungs 2 (two) times daily.    [provider]  calcium carbonate (OS-CAL - DOSED IN MG OF  ELEMENTAL CALCIUM) 1250 (500 Ca) MG tablet Take 1 tablet by mouth daily with breakfast.     [place], Novella Olive, MD  cephALEXin (KEFLEX) 500 MG capsule Take 1 capsule (500 mg total) by mouth 3 (three) times daily for 21 days. 11/02/19 11/23/19  Devoria Albe, MD  ergocalciferol (VITAMIN D2) 1.25 MG (50000 UT) capsule Take 1 capsule (50,000 Units total) by mouth once a week. 03/28/19   Lockamy, Randi L, NP-C  fentaNYL (DURAGESIC) 12 MCG/HR Place 1 patch onto the skin every 3 (three) days. 10/31/19   Jones Bales, NP  insulin glargine (LANTUS) 100 UNIT/ML injection Inject 24 Units into the skin at bedtime.    [provider]  Lancets (ONETOUCH DELICA PLUS LANCET33G) MISC USE TO TEST BLOOD SUGAR 2 TIMES A DAY 01/19/19   [provider]  LANTUS SOLOSTAR 100 UNIT/ML Solostar Pen SMARTSIG:24 Unit(s) SUB-Q Every Night 08/25/19   [provider]  letrozole (FEMARA) 2.5 MG tablet Take 1 tablet (2.5 mg total) by mouth daily. 09/25/18   Lockamy, Randi L, NP-C  loratadine (CLARITIN) 10 MG tablet Take 10 mg by mouth daily as needed for allergies.    [provider]  losartan (COZAAR) 100 MG tablet Take 100 mg daily by mouth.    [provider]  meclizine (ANTIVERT) 25 MG tablet Take 25 mg by mouth 2 (two) times daily as needed. 03/14/19   [provider]  megestrol (MEGACE) 20 MG tablet Take 20 mg by mouth 2 (two) times daily.    [provider]  methocarbamol (ROBAXIN) 500 MG tablet TAKE 1 TABLET BY MOUTH EVERY 6 HOURS AS NEEDED FOR MUSCLE SPASMS. 06/04/19   Jones Bales, NP  Multiple Vitamins-Minerals (ICAPS AREDS 2) CAPS Take by mouth.    [provider]  naloxegol oxalate (MOVANTIK) 25 MG TABS tablet Take 1 tablet (25 mg total) by mouth daily. 01/13/17   Jones Bales, NP  ondansetron (ZOFRAN-ODT) 4 MG disintegrating tablet Take 4 mg by mouth every 8 (eight) hours as needed for nausea or vomiting. Every 4-6 hours as needed    [provider]  ONETOUCH VERIO test strip USE TO TEST BLOOD SUGAR 2 TIMES A DAY 01/19/19   [provider]  oxyCODONE-acetaminophen (PERCOCET) 10-325 MG tablet Take 1 tablet by mouth every 6 (six) hours as needed for pain. 10/31/19   Jones Bales, NP  pregabalin (LYRICA) 100 MG capsule Take 1 capsule (100 mg total) by mouth 2 (two) times daily. 10/31/19   Jones Bales, NP  senna (SENOKOT) 8.6 MG TABS tablet Take 1 tablet by mouth.    [provider]  sitaGLIPtin (JANUVIA) 100 MG tablet Take 100 mg by mouth daily.    [provider]  valsartan (DIOVAN) 160 MG tablet Take 160 mg by mouth daily.    [provider]    Allergies    Other and Pollen extract  Review of Systems   Review of Systems  All other systems reviewed and are negative.   Physical Exam Updated Vital Signs BP (!) 142/85   Pulse (!) 115   Temp 99.2 F (37.3 C) (Oral)   Resp 16   Ht $R'5\' 3"'zk$  (1.6 m)   Wt 49 kg   SpO2 99%   BMI 19.13 kg/m   Physical Exam Vitals and nursing note reviewed.  Constitutional:      General: She is not in acute distress.    Appearance: She is normal weight. She is not ill-appearing or toxic-appearing.  HENT:     Head: Normocephalic and atraumatic.     Right Ear: External ear normal.     Left Ear: External ear normal.     Nose: Nose normal.     Mouth/Throat:     Mouth: Mucous membranes are dry.  Eyes:     General: No scleral icterus.    Extraocular Movements: Extraocular movements intact.     Conjunctiva/sclera: Conjunctivae normal.     Pupils: Pupils are equal, round, and reactive to light.  Cardiovascular:     Rate and Rhythm: Regular rhythm. Tachycardia present.     Pulses: Normal pulses.     Heart sounds: Normal heart sounds. No murmur heard.   Pulmonary:     Effort: Pulmonary effort is normal. No respiratory distress.     Breath sounds: Normal breath sounds.  Abdominal:     General: Bowel sounds are decreased. There is distension.       Palpations: Abdomen is soft.     Comments: Patient is tender in the epigastric and left upper quadrant and she states that is where most of her pain is been located however when I was done palpating her abdomen and was putting orders on the computer she then stated it hurt on her right lateral abdomen.  Musculoskeletal:     Cervical back: Normal range of motion.  Neurological:     Mental Status: She is alert.     ED Results / Procedures / Treatments   Labs (all labs ordered are listed, but only abnormal results are displayed) Results for orders placed or performed during the hospital encounter of 11/01/19  Resp Panel by RT PCR (RSV, Flu A&B, Covid) - Nasopharyngeal Swab   Specimen: Nasopharyngeal Swab  Result Value Ref Range   SARS Coronavirus 2 by RT PCR NEGATIVE NEGATIVE   Influenza A by PCR NEGATIVE NEGATIVE   Influenza B by PCR NEGATIVE NEGATIVE   Respiratory Syncytial Virus by PCR NEGATIVE NEGATIVE  CBC  Result Value Ref Range   WBC 11.2 (H) 4.0 - 10.5 K/uL   RBC 4.13 3.87 - 5.11 MIL/uL   Hemoglobin 10.5 (L) 12.0 - 15.0 g/dL   HCT 34.1 (L) 36 - 46 %   MCV 82.6 80.0 - 100.0 fL   MCH 25.4 (L) 26.0 - 34.0 pg   MCHC 30.8 30.0 - 36.0 g/dL   RDW 18.0 (H) 11.5 - 15.5 %   Platelets 416 (H) 150 - 400 K/uL   nRBC 0.3 (H) 0.0 - 0.2 %  Comprehensive metabolic panel  Result Value Ref Range   Sodium 132 (L) 135 - 145 mmol/L   Potassium  3.4 (L) 3.5 - 5.1 mmol/L   Chloride 93 (L) 98 - 111 mmol/L   CO2 24 22 - 32 mmol/L   Glucose, Bld 279 (H) 70 - 99 mg/dL   BUN 13 6 - 20 mg/dL   Creatinine, Ser 0.81 0.44 - 1.00 mg/dL   Calcium 9.1 8.9 - 10.3 mg/dL   Total Protein 9.0 (H) 6.5 - 8.1 g/dL   Albumin 2.9 (L) 3.5 - 5.0 g/dL   AST 47 (H) 15 - 41 U/L   ALT 16 0 - 44 U/L   Alkaline Phosphatase 266 (H) 38 - 126 U/L   Total Bilirubin 0.5 0.3 - 1.2 mg/dL   GFR calc non Af Amer >60 >60 mL/min   GFR calc Af Amer >60 >60 mL/min   Anion gap 15 5 - 15  Urinalysis, Routine w reflex  microscopic Urine, Clean Catch  Result Value Ref Range   Color, Urine AMBER (A) YELLOW   APPearance HAZY (A) CLEAR   Specific Gravity, Urine 1.027 1.005 - 1.030   pH 5.0 5.0 - 8.0   Glucose, UA NEGATIVE NEGATIVE mg/dL   Hgb urine dipstick NEGATIVE NEGATIVE   Bilirubin Urine NEGATIVE NEGATIVE   Ketones, ur NEGATIVE NEGATIVE mg/dL   Protein, ur 30 (A) NEGATIVE mg/dL   Nitrite NEGATIVE NEGATIVE   Leukocytes,Ua NEGATIVE NEGATIVE   RBC / HPF 0-5 0 - 5 RBC/hpf   WBC, UA 21-50 0 - 5 WBC/hpf   Bacteria, UA NONE SEEN NONE SEEN   Squamous Epithelial / LPF 0-5 0 - 5   Mucus PRESENT    Hyaline Casts, UA PRESENT    Non Squamous Epithelial 0-5 (A) NONE SEEN   Laboratory interpretation all normal except possible UTI, malnutrition, mild leukocytosis, improving anemia    EKG None  Radiology CT Abdomen Pelvis W Contrast  Result Date: 11/02/2019 CLINICAL DATA:  Abdominal pain, history of breast cancer EXAM: CT ABDOMEN AND PELVIS WITH CONTRAST TECHNIQUE: Multidetector CT imaging of the abdomen and pelvis was performed using the standard protocol following bolus administration of intravenous contrast. CONTRAST:  170mL OMNIPAQUE IOHEXOL 300 MG/ML  SOLN COMPARISON:  None. FINDINGS: Lower chest: The visualized heart size within normal limits. No pericardial fluid/thickening. No hiatal hernia. Multiple pulmonary nodules are seen in the visualized left lung base. The largest posteriorly measures 1 cm in transverse dimension. Hepatobiliary: Extensive heterogeneous hypodense lesions are seen throughout the liver parenchyma. The portal vein is patent. No evidence of calcified gallstones, gallbladder wall thickening or biliary dilatation. Pancreas: Unremarkable. No pancreatic ductal dilatation or surrounding inflammatory changes. Spleen: Normal in size without focal abnormality. Adrenals/Urinary Tract: Both adrenal glands appear normal. There is a punctate calcifications seen in the lower pole of the left kidney.  No hydronephrosis is noted. Bladder is unremarkable. Stomach/Bowel: The stomach, small bowel, and colon are normal in appearance. No inflammatory changes, wall thickening, or obstructive findings.The appendix is normal. Vascular/Lymphatic: There are no enlarged mesenteric, retroperitoneal, or pelvic lymph nodes. Scattered aortic atherosclerotic calcifications are seen without aneurysmal dilatation. Reproductive: The uterus and adnexa are unremarkable. Other: No evidence of abdominal wall mass or hernia. Musculoskeletal: Extensive mixed lytic and blastic osseous lesions are seen throughout. Since the prior exam there appears to be a slight anterior wedge compression deformity of the T12 vertebral body with less than 25% loss in vertebral body height. IMPRESSION: Extensive hypodense lesions throughout the liver parenchyma, concerning for hepatic metastases. Pulmonary nodules at the left lung base, concerning for pulmonary metastases. Extensive osseous metastatic lesions. Since  the prior exam subacute anterior wedge compression deformity of the T12 vertebral body. Aortic Atherosclerosis (ICD10-I70.0). Electronically Signed   By: Prudencio Pair M.D.   On: 11/02/2019 03:39      MR Lumbar Spine W Wo Contrast  Result Date: 10/25/2019 CLINICAL DATA:  Lumbar pain. Radicular pain of right lower extremity. Additional history provided by scanning technologist: Patient reports low back pain radiating down right leg for 1.5 years.  IMPRESSION: Multifocal osseous signal abnormality and enhancement as described and suspicious for widespread osseous metastatic disease. Background diffuse abnormal T1 hypointense marrow signal which is nonspecific, but may reflect treatment effect and/or anemia. Lumbar spondylosis as outlined with findings most notably as follows. At L3-L4, there is moderate disc degeneration. Grade 1 anterolisthesis. Multifactorial moderate/severe central canal stenosis and left greater than right subarticular  narrowing. Mild bilateral neural foraminal narrowing. At L4-L5, there is moderate/advanced disc degeneration. Grade 2 anterolisthesis. Multifactorial moderate/severe central canal stenosis with severe bilateral subarticular narrowing. Bilateral neural foraminal narrowing (moderate right, mild left). Multifactorial mild/moderate L5-S1 right neural foraminal narrowing. Electronically Signed   By: Kellie Simmering DO   On: 10/25/2019 07:58   Procedures Procedures (including critical care time)  Medications Ordered in ED Medications  iohexol (OMNIPAQUE) 9 MG/ML oral solution (has no administration in time range)  sodium chloride 0.9 % bolus 1,000 mL (0 mLs Intravenous Stopped 11/02/19 0318)  sodium chloride 0.9 % bolus 500 mL (0 mLs Intravenous Stopped 11/02/19 0318)  iohexol (OMNIPAQUE) 300 MG/ML solution 100 mL (100 mLs Intravenous Contrast Given 11/02/19 0304)  ondansetron (ZOFRAN) injection 4 mg (4 mg Intravenous Given 11/02/19 0152)  fentaNYL (SUBLIMAZE) injection 50 mcg (50 mcg Intravenous Given 11/02/19 1448)    ED Course  I have reviewed the triage vital signs and the nursing notes.  Pertinent labs & imaging results that were available during my care of the patient were reviewed by me and considered in my medical decision making (see chart for details).    MDM Rules/Calculators/A&P                          CT of the abdomen/pelvis was ordered to evaluate her complaints of pain.  She started complaining of nausea after ingesting the contrast and was given Zofran.  She then complained of wanting pain medicine and she has a fentanyl patch on, she did not want to get pain medicine while the patch was on.  I have talked to the patient about her CT results which are not very encouraging.  I am afraid her breast cancer has recurred, she is a non-smoker so less likely to be a primary lung cancer.  However hopefully the liver lesions will be amenable to getting a biopsy done by IR.  I will send a E chart  message to her nurse practitioner Lockamy to make sure she gets seen soon in the office.  Patient possibly has a UTI, urine culture was sent and she was started on Keflex.  Final Clinical Impression(s) / ED Diagnoses Final diagnoses:  Left upper quadrant abdominal pain  Urinary tract infection without hematuria, site unspecified  Multiple lesions of metastatic malignancy ([place])    Rx / DC Orders ED Discharge Orders         Ordered    cephALEXin (KEFLEX) 500 MG capsule  3 times daily        11/02/19 0411         Plan discharge  Rolland Porter, MD, Abram Sander  Rolland Porter, MD 11/02/19 [place], [place], MD 11/02/19 [id number]

## 2019-11-03 LAB — URINE CULTURE: Culture: <10000 — AB

## 2019-11-05 ENCOUNTER — Other Ambulatory Visit: Payer: Self-pay

## 2019-11-05 ENCOUNTER — Encounter (HOSPITAL_COMMUNITY): Payer: Self-pay | Admitting: Radiology

## 2019-11-05 ENCOUNTER — Encounter (HOSPITAL_COMMUNITY): Payer: Self-pay | Admitting: Hematology

## 2019-11-05 ENCOUNTER — Inpatient Hospital Stay (HOSPITAL_COMMUNITY): Payer: BC Managed Care – PPO | Attending: Hematology | Admitting: Hematology

## 2019-11-05 VITALS — BP 130/79 | HR 117 | Temp 96.9°F | Resp 16 | Wt 114.1 lb

## 2019-11-05 DIAGNOSIS — C7951 Secondary malignant neoplasm of bone: Secondary | ICD-10-CM | POA: Insufficient documentation

## 2019-11-05 DIAGNOSIS — G479 Sleep disorder, unspecified: Secondary | ICD-10-CM | POA: Insufficient documentation

## 2019-11-05 DIAGNOSIS — Z79811 Long term (current) use of aromatase inhibitors: Secondary | ICD-10-CM | POA: Diagnosis not present

## 2019-11-05 DIAGNOSIS — R1012 Left upper quadrant pain: Secondary | ICD-10-CM | POA: Diagnosis not present

## 2019-11-05 DIAGNOSIS — Z170 Estrogen receptor positive status [ER+]: Secondary | ICD-10-CM | POA: Insufficient documentation

## 2019-11-05 DIAGNOSIS — M8580 Other specified disorders of bone density and structure, unspecified site: Secondary | ICD-10-CM | POA: Diagnosis not present

## 2019-11-05 DIAGNOSIS — C50412 Malignant neoplasm of upper-outer quadrant of left female breast: Secondary | ICD-10-CM | POA: Insufficient documentation

## 2019-11-05 DIAGNOSIS — R112 Nausea with vomiting, unspecified: Secondary | ICD-10-CM | POA: Insufficient documentation

## 2019-11-05 DIAGNOSIS — R0789 Other chest pain: Secondary | ICD-10-CM | POA: Diagnosis not present

## 2019-11-05 DIAGNOSIS — K5900 Constipation, unspecified: Secondary | ICD-10-CM | POA: Insufficient documentation

## 2019-11-05 DIAGNOSIS — R918 Other nonspecific abnormal finding of lung field: Secondary | ICD-10-CM | POA: Diagnosis not present

## 2019-11-05 DIAGNOSIS — Z923 Personal history of irradiation: Secondary | ICD-10-CM | POA: Diagnosis not present

## 2019-11-05 DIAGNOSIS — C787 Secondary malignant neoplasm of liver and intrahepatic bile duct: Secondary | ICD-10-CM | POA: Diagnosis not present

## 2019-11-05 DIAGNOSIS — M2550 Pain in unspecified joint: Secondary | ICD-10-CM | POA: Insufficient documentation

## 2019-11-05 DIAGNOSIS — M549 Dorsalgia, unspecified: Secondary | ICD-10-CM | POA: Insufficient documentation

## 2019-11-05 DIAGNOSIS — Z9221 Personal history of antineoplastic chemotherapy: Secondary | ICD-10-CM | POA: Insufficient documentation

## 2019-11-05 DIAGNOSIS — Z79899 Other long term (current) drug therapy: Secondary | ICD-10-CM | POA: Insufficient documentation

## 2019-11-05 NOTE — Progress Notes (Signed)
Krista Doyle Female, 59 y.o., 1960-02-17 MRN:  [id number] Phone:  [phone] [name] PCP:  [name], MD Primary Cvg:  [facility] With Radiology (AP-CT 1) 11/12/2019 at 3:30 PM  RE: US Liver Biopsy Received: Today Arne Cleveland, MD  Ward, [name]   [place] core liver lesion  Breast CA met?   DDH       Previous Messages   ----- Message -----  From: Garth Bigness D  Sent: 11/05/2019 12:17 PM EDT  To: Ir Procedure Requests  Subject: US Liver Biopsy                  Procedure: US Liver Biopsy   Reason:  Malignant neoplasm of upper-outer quadrant of left breast in female, estrogen receptor positive, Liver metastases, liver mets, hx breast cancer   History: MR, CT in computer   Provider: Derek Jack   Provider Contact: [phone]

## 2019-11-05 NOTE — Patient Instructions (Signed)
[place] at [facility] Discharge Instructions  You were seen today by Dr. Delton Coombes. He went over your recent results and scans; your breast cancer has most likely metastasized to your bones. You will be scheduled to have an MRI of your lower back and a CT scan of your chest. You will be referred to radiology to have biopsy done of your liver. You will be referred to a dietician; drink 3-4 cans of Glucerna to maintain your weight. Dr. Delton Coombes will see you back after the biopsy for follow up.   Thank you for choosing [place] at [facility] to provide your oncology and hematology care.  To afford each patient quality time with our provider, please arrive at least 15 minutes before your scheduled appointment time.   If you have a lab appointment with the [facility] please come in thru the Main Entrance and check in at the main information desk  You need to re-schedule your appointment should you arrive 10 or more minutes late.  We strive to give you quality time with our providers, and arriving late affects you and other patients whose appointments are after yours.  Also, if you no show three or more times for appointments you may be dismissed from the clinic at the providers discretion.     Again, thank you for choosing [facility].  Our hope is that these requests will decrease the amount of time that you wait before being seen by our physicians.       _____________________________________________________________  Should you have questions after your visit to [facility], please contact our office at [phone] between the hours of 8:00 a.m. and 4:30 p.m.  Voicemails left after 4:00 p.m. will not be returned until the following business day.  For prescription refill requests, have your pharmacy contact our office and allow 72 hours.    Cancer Center Support Programs:   > Cancer Support Group  2nd Tuesday of the  month 1pm-2pm, Journey Room

## 2019-11-05 NOTE — Progress Notes (Signed)
[facility] [address] [place] [address]   Patient Care Team: [name], MD as PCP - General (Internal Medicine) Danie Binder, MD (Inactive) as Consulting Physician (Gastroenterology)  SUMMARY OF ONCOLOGIC HISTORY: Oncology History   No history exists.    CHIEF COMPLIANT: Follow-up for left breast cancer   INTERVAL HISTORY: Krista Doyle is a 59 y.o. female here today for follow up of her left breast cancer. Her last visit was with Dr. Walden Field on 03/26/2018.   Today she is accompanied by her husband. She went to [facility] on 10/1 for her left upper abdominal pain. She reports that she has lost 30 lbs in the last month since her appetite has decreased due to taste being weird. Her left upper abdominal pain started 2 weeks ago. She is taking Percocet every 6 hours and fentanyl patches every 3 days. She continues having back pain radiating to her right leg and occasionally giving out. She has been nauseous and vomiting, but after starting Zofran, her vomiting stopped though she gets occasional episodes of nausea.   Her breast cancer was treated in [place], [place]. She has been using a walker for 2 years now.    REVIEW OF SYSTEMS:   Review of Systems  Constitutional: Positive for appetite change (25% d/t weird taste), fatigue (depleted) and unexpected weight change (30 lbs in 1 month).  Respiratory: Positive for chest tightness (w/ deep breaths) and shortness of breath (occasional).   Gastrointestinal: Positive for abdominal pain (LUQ abdominal pain), constipation, diarrhea (yesterday), nausea and vomiting.  Musculoskeletal: Positive for back pain (7/10 lower back and left flank pain).  Neurological: Positive for extremity weakness (R leg giving out d/t back pain) and numbness (toes & feet).  Psychiatric/Behavioral: Positive for sleep disturbance. The patient is nervous/anxious.   All other systems reviewed and are negative.   I have reviewed  the past medical history, past surgical history, social history and family history with the patient and they are unchanged from previous note.   ALLERGIES:   is allergic to other and pollen extract.   MEDICATIONS:  Current Outpatient Medications  Medication Sig Dispense Refill  . albuterol (PROVENTIL HFA;VENTOLIN HFA) 108 (90 Base) MCG/ACT inhaler Inhale 1 puff into the lungs every 6 (six) hours as needed for wheezing or shortness of breath.    Marland Kitchen amitriptyline (ELAVIL) 10 MG tablet 1-2 tablets at bedtime every day 180 tablet 2  . atorvastatin (LIPITOR) 20 MG tablet Take 20 mg by mouth daily.    . Blood Glucose Monitoring Suppl (ONETOUCH VERIO REFLECT) w/Device KIT USE TO TEST BLOOD SUGAR 2 TIMES A DAY    . budesonide-formoterol (SYMBICORT) 160-4.5 MCG/ACT inhaler Inhale 2 puffs into the lungs 2 (two) times daily.    . calcium carbonate (OS-CAL - DOSED IN MG OF ELEMENTAL CALCIUM) 1250 (500 Ca) MG tablet Take 1 tablet by mouth daily with breakfast.     . cephALEXin (KEFLEX) 500 MG capsule Take 1 capsule (500 mg total) by mouth 3 (three) times daily for 21 days. 30 capsule 0  . ergocalciferol (VITAMIN D2) 1.25 MG (50000 UT) capsule Take 1 capsule (50,000 Units total) by mouth once a week. 16 capsule 4  . fentaNYL (DURAGESIC) 12 MCG/HR Place 1 patch onto the skin every 3 (three) days. 10 patch 0  . insulin glargine (LANTUS) 100 UNIT/ML injection Inject 24 Units into the skin at bedtime.    . Lancets (ONETOUCH DELICA PLUS [id number]) MISC USE  TO TEST BLOOD SUGAR 2 TIMES A DAY    . LANTUS SOLOSTAR 100 UNIT/ML Solostar Pen SMARTSIG:24 Unit(s) SUB-Q Every Night    . letrozole (FEMARA) 2.5 MG tablet Take 1 tablet (2.5 mg total) by mouth daily. 90 tablet 3  . loratadine (CLARITIN) 10 MG tablet Take 10 mg by mouth daily as needed for allergies.    Marland Kitchen losartan (COZAAR) 100 MG tablet Take 100 mg daily by mouth.    . meclizine (ANTIVERT) 25 MG tablet Take 25 mg by mouth 2 (two) times daily as needed.    .  megestrol (MEGACE) 20 MG tablet Take 20 mg by mouth 2 (two) times daily.    . methocarbamol (ROBAXIN) 500 MG tablet TAKE 1 TABLET BY MOUTH EVERY 6 HOURS AS NEEDED FOR MUSCLE SPASMS. 60 tablet 2  . Multiple Vitamins-Minerals (ICAPS AREDS 2) CAPS Take by mouth.    . naloxegol oxalate (MOVANTIK) 25 MG TABS tablet Take 1 tablet (25 mg total) by mouth daily. 30 tablet 3  . ondansetron (ZOFRAN-ODT) 4 MG disintegrating tablet Take 4 mg by mouth every 8 (eight) hours as needed for nausea or vomiting. Every 4-6 hours as needed    . ONETOUCH VERIO test strip USE TO TEST BLOOD SUGAR 2 TIMES A DAY    . oxyCODONE-acetaminophen (PERCOCET) 10-325 MG tablet Take 1 tablet by mouth every 6 (six) hours as needed for pain. 120 tablet 0  . pregabalin (LYRICA) 100 MG capsule Take 1 capsule (100 mg total) by mouth 2 (two) times daily. 60 capsule 3  . senna (SENOKOT) 8.6 MG TABS tablet Take 1 tablet by mouth.    . sitaGLIPtin (JANUVIA) 100 MG tablet Take 100 mg by mouth daily.    . valsartan (DIOVAN) 160 MG tablet Take 160 mg by mouth daily.     No current facility-administered medications for this visit.     PHYSICAL EXAMINATION: Performance status (ECOG): 1 - Symptomatic but completely ambulatory  Vitals:   11/05/19 1058  BP: 130/79  Pulse: (!) 117  Resp: 16  Temp: (!) 96.9 F (36.1 C)  SpO2: 100%   Wt Readings from Last 3 Encounters:  11/05/19 114 lb 1.6 oz (51.8 kg)  11/01/19 108 lb (49 kg)  10/31/19 111 lb (50.3 kg)   Physical Exam Vitals reviewed.  Constitutional:      Appearance: Normal appearance.  Cardiovascular:     Rate and Rhythm: Regular rhythm. Tachycardia present.     Pulses: Normal pulses.     Heart sounds: Normal heart sounds.  Pulmonary:     Effort: Pulmonary effort is normal.     Breath sounds: Normal breath sounds.  Abdominal:     Palpations: Abdomen is soft. There is hepatomegaly (2 fingerbreaths below costal margin).     Tenderness: There is abdominal tenderness in the  right upper quadrant and left upper quadrant.  Musculoskeletal:     Lumbar back: Bony tenderness (TTP over lower vertebral bodies) present.  Neurological:     General: No focal deficit present.     Mental Status: She is alert and oriented to person, place, and time.  Psychiatric:        Mood and Affect: Mood normal.        Behavior: Behavior normal.     Breast Exam Chaperone: Milinda Antis, MD     LABORATORY DATA:  I have reviewed the data as listed CMP Latest Ref Rng & Units 11/01/2019 09/20/2019 09/10/2019  Glucose 70 - 99 mg/dL 279(H) 131(H) 160(H)  BUN 6 - 20 mg/dL _0 Creatinine 0.44 - 1.00 mg/dL 0.81 0.81 1.06(H)  Sodium 135 - 145 mmol/L 132(L) 141 139  Potassium 3.5 - 5.1 mmol/L 3.4(L) 3.2(L) 4.2  Chloride 98 - 111 mmol/L 93(L) 102 100  CO2 22 - 32 mmol/L 24 29 33(H)  Calcium 8.9 - 10.3 mg/dL 9.1 10.0 10.3  Total Protein 6.5 - 8.1 g/dL 9.0(H) 8.0 7.5  Total Bilirubin 0.3 - 1.2 mg/dL 0.5 0.2(L) 0.2  Alkaline Phos 38 - 126 U/L 266(H) 157(H) -  AST 15 - 41 U/L 47(H) 31 29  ALT 0 - 44 U/L _1 No results found for: [id number] Lab Results  Component Value Date   WBC 11.2 (H) 11/01/2019   HGB 10.5 (L) 11/01/2019   HCT 34.1 (L) 11/01/2019   MCV 82.6 11/01/2019   PLT 416 (H) 11/01/2019   NEUTROABS 3.5 09/20/2019    ASSESSMENT:  1.  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 -CTAP with contrast on 11/02/2019 done in the ER showed extensive hypodense lesions throughout the liver parenchyma concerning for metastatic disease.  Lung nodules at the left lung base.  Extensive bone metastasis.  Anterior wedge compression deformity of T12 vertebral body.  2.  History of left breast cancer: -Diagnosed in 2008, ER/PR/HER-2 positive.  Treated in [place], [place]. -Completed 1 year of Herceptin. -Took 5 years of tamoxifen  and 5 years of Femara. -Genetic testing was reportedly negative.  3.  Osteopenia: -DEXA scan on 03/27/2019 with T score -1.0. -Started on Prolia from 03/13/2015, last injection on 09/26/2019.  PLAN:  1.  Clinical metastatic breast cancer to bones and liver: -We discussed images of the CT scan and MRI lumbar spine with the patient in detail. -Recommend CT scan of the chest with contrast and MRI of the thoracic spine with and without contrast to complete work-up. -Recommended ultrasound-guided biopsy of the liver lesions.  Evaluate for receptor status if breast cancer is confirmed. -We will also consider genetic testing. -RTC after biopsy.  2.  Weight loss: -Lost 20 to 30 pounds in the last 1 month due to decreased appetite. -Recommended nutritional supplements.  We will also recommend dietary consultation.  3.  Back pain: -MRI of the lumbar spine on 10/24/2019 showed multifocal bone lesions throughout the spine. -She follows with pain management.  She is on fentanyl 12 mcg patch. -Percocet 10/325 was changed to every 6 hours as needed on 10/31/2019. -She has follow-up with Dr. Aline Brochure in 2 weeks.   No orders of the defined types were placed in this encounter.  The patient has a good understanding of the overall plan. she agrees with it. she will call with any problems that may develop before the next visit here. Total time spent is 40 minutes with more than 50% of the time spent face-to-face discussing and reviewing scans, further work-up, counseling and coordination of care.   Derek Jack, MD [place] [phone]   I, Milinda Antis, am acting as a scribe for Dr. Sanda Linger.  I, Derek Jack MD, have reviewed the above documentation for accuracy and completeness, and I agree with the above.

## 2019-11-12 ENCOUNTER — Ambulatory Visit (HOSPITAL_COMMUNITY)
Admission: RE | Admit: 2019-11-12 | Discharge: 2019-11-12 | Disposition: A | Discharge status: Home / Self Care | Payer: BC Managed Care – PPO | Source: Ambulatory Visit | Attending: Hematology | Admitting: Hematology

## 2019-11-12 ENCOUNTER — Ambulatory Visit (HOSPITAL_COMMUNITY): Payer: BC Managed Care – PPO

## 2019-11-12 ENCOUNTER — Other Ambulatory Visit: Payer: Self-pay

## 2019-11-12 DIAGNOSIS — Z170 Estrogen receptor positive status [ER+]: Secondary | ICD-10-CM | POA: Insufficient documentation

## 2019-11-12 DIAGNOSIS — I708 Atherosclerosis of other arteries: Secondary | ICD-10-CM | POA: Diagnosis not present

## 2019-11-12 DIAGNOSIS — C50412 Malignant neoplasm of upper-outer quadrant of left female breast: Secondary | ICD-10-CM | POA: Diagnosis not present

## 2019-11-12 DIAGNOSIS — S2241XA Multiple fractures of ribs, right side, initial encounter for closed fracture: Secondary | ICD-10-CM | POA: Diagnosis not present

## 2019-11-12 DIAGNOSIS — R634 Abnormal weight loss: Secondary | ICD-10-CM | POA: Diagnosis not present

## 2019-11-12 DIAGNOSIS — R630 Anorexia: Secondary | ICD-10-CM | POA: Diagnosis not present

## 2019-11-12 MED ORDER — IOHEXOL 300 MG/ML  SOLN
75.0000 mL | Freq: Once | INTRAMUSCULAR | Status: AC | PRN
  Administered 2019-11-12: 75 mL via INTRAVENOUS

## 2019-11-14 ENCOUNTER — Ambulatory Visit (INDEPENDENT_AMBULATORY_CARE_PROVIDER_SITE_OTHER): Payer: BC Managed Care – PPO | Admitting: Orthopedic Surgery

## 2019-11-14 ENCOUNTER — Other Ambulatory Visit (HOSPITAL_COMMUNITY): Payer: Self-pay | Admitting: *Deleted

## 2019-11-14 ENCOUNTER — Encounter: Payer: Self-pay | Admitting: Orthopedic Surgery

## 2019-11-14 ENCOUNTER — Other Ambulatory Visit: Payer: Self-pay

## 2019-11-14 VITALS — BP 108/72 | HR 132 | Ht 63.0 in | Wt 114.0 lb

## 2019-11-14 DIAGNOSIS — C7951 Secondary malignant neoplasm of bone: Secondary | ICD-10-CM

## 2019-11-14 DIAGNOSIS — M5136 Other intervertebral disc degeneration, lumbar region: Secondary | ICD-10-CM

## 2019-11-14 NOTE — Progress Notes (Signed)
Chief Complaint  Patient presents with  . Back Pain  . Results    reveiw MRI lumbar spine    59 year old female with breast cancer presented with back pain and radicular symptoms she was sent for MRI and it showed bone metastases  I have explained the results are reviewed the MRI shows multiple levels of bone mets throughout the lumbar spine as well as L3-L5 disc degeneration anterolisthesis moderate to severe central and subarticular foraminal narrowing.  Recommend follow-up with oncology  She has an MRI pending on the 18th for the thoracic spine and after that is done I can correlate with oncologist on whether or not she is a candidate for radiation or chemotherapy based on the biopsy she is having soon

## 2019-11-14 NOTE — Patient Instructions (Signed)
Dr Aline Brochure will correspond with Dr Raliegh Ip after 2nd MRI to determine treatment for the bone

## 2019-11-18 ENCOUNTER — Other Ambulatory Visit: Payer: Self-pay

## 2019-11-18 ENCOUNTER — Other Ambulatory Visit: Payer: Self-pay | Admitting: Radiology

## 2019-11-18 ENCOUNTER — Ambulatory Visit (HOSPITAL_COMMUNITY)
Admission: RE | Admit: 2019-11-18 | Discharge: 2019-11-18 | Disposition: A | Discharge status: Home / Self Care | Payer: BC Managed Care – PPO | Source: Ambulatory Visit | Attending: Hematology | Admitting: Hematology

## 2019-11-18 DIAGNOSIS — Z170 Estrogen receptor positive status [ER+]: Secondary | ICD-10-CM | POA: Insufficient documentation

## 2019-11-18 DIAGNOSIS — C50412 Malignant neoplasm of upper-outer quadrant of left female breast: Secondary | ICD-10-CM | POA: Diagnosis not present

## 2019-11-18 DIAGNOSIS — M5134 Other intervertebral disc degeneration, thoracic region: Secondary | ICD-10-CM | POA: Diagnosis not present

## 2019-11-18 DIAGNOSIS — M4314 Spondylolisthesis, thoracic region: Secondary | ICD-10-CM | POA: Diagnosis not present

## 2019-11-18 MED ORDER — GADOBUTROL 1 MMOL/ML IV SOLN
5.0000 mL | Freq: Once | INTRAVENOUS | Status: AC | PRN
  Administered 2019-11-18: 5 mL via INTRAVENOUS

## 2019-11-19 ENCOUNTER — Other Ambulatory Visit: Payer: Self-pay

## 2019-11-19 ENCOUNTER — Ambulatory Visit (HOSPITAL_COMMUNITY)
Admission: RE | Admit: 2019-11-19 | Discharge: 2019-11-19 | Disposition: A | Discharge status: Home / Self Care | Payer: BC Managed Care – PPO | Source: Ambulatory Visit | Attending: Hematology | Admitting: Hematology

## 2019-11-19 DIAGNOSIS — Z794 Long term (current) use of insulin: Secondary | ICD-10-CM | POA: Insufficient documentation

## 2019-11-19 DIAGNOSIS — C50412 Malignant neoplasm of upper-outer quadrant of left female breast: Secondary | ICD-10-CM

## 2019-11-19 DIAGNOSIS — Z9012 Acquired absence of left breast and nipple: Secondary | ICD-10-CM | POA: Diagnosis not present

## 2019-11-19 DIAGNOSIS — Z853 Personal history of malignant neoplasm of breast: Secondary | ICD-10-CM | POA: Diagnosis not present

## 2019-11-19 DIAGNOSIS — C787 Secondary malignant neoplasm of liver and intrahepatic bile duct: Secondary | ICD-10-CM | POA: Insufficient documentation

## 2019-11-19 DIAGNOSIS — E785 Hyperlipidemia, unspecified: Secondary | ICD-10-CM | POA: Insufficient documentation

## 2019-11-19 DIAGNOSIS — E7800 Pure hypercholesterolemia, unspecified: Secondary | ICD-10-CM | POA: Diagnosis not present

## 2019-11-19 DIAGNOSIS — C50912 Malignant neoplasm of unspecified site of left female breast: Secondary | ICD-10-CM | POA: Diagnosis not present

## 2019-11-19 DIAGNOSIS — I10 Essential (primary) hypertension: Secondary | ICD-10-CM | POA: Diagnosis not present

## 2019-11-19 DIAGNOSIS — E119 Type 2 diabetes mellitus without complications: Secondary | ICD-10-CM | POA: Insufficient documentation

## 2019-11-19 DIAGNOSIS — K7689 Other specified diseases of liver: Secondary | ICD-10-CM | POA: Diagnosis not present

## 2019-11-19 DIAGNOSIS — Z7951 Long term (current) use of inhaled steroids: Secondary | ICD-10-CM | POA: Insufficient documentation

## 2019-11-19 DIAGNOSIS — Z79899 Other long term (current) drug therapy: Secondary | ICD-10-CM | POA: Diagnosis not present

## 2019-11-19 DIAGNOSIS — J45909 Unspecified asthma, uncomplicated: Secondary | ICD-10-CM | POA: Insufficient documentation

## 2019-11-19 DIAGNOSIS — C229 Malignant neoplasm of liver, not specified as primary or secondary: Secondary | ICD-10-CM | POA: Diagnosis not present

## 2019-11-19 DIAGNOSIS — Z170 Estrogen receptor positive status [ER+]: Secondary | ICD-10-CM

## 2019-11-19 LAB — PROTIME-INR
INR: 1.1 (ref 0.8–1.2)
Prothrombin Time: 13.3 seconds (ref 11.4–15.2)

## 2019-11-19 LAB — CBC
HCT: 30.4 % — ABNORMAL LOW (ref 36.0–46.0)
Hemoglobin: 8.9 g/dL — ABNORMAL LOW (ref 12.0–15.0)
MCH: 24.5 pg — ABNORMAL LOW (ref 26.0–34.0)
MCHC: 29.3 g/dL — ABNORMAL LOW (ref 30.0–36.0)
MCV: 83.7 fL (ref 80.0–100.0)
Platelets: 435 10*3/uL — ABNORMAL HIGH (ref 150–400)
RBC: 3.63 MIL/uL — ABNORMAL LOW (ref 3.87–5.11)
RDW: 19.2 % — ABNORMAL HIGH (ref 11.5–15.5)
WBC: 8.1 10*3/uL (ref 4.0–10.5)
nRBC: 0.4 % — ABNORMAL HIGH (ref 0.0–0.2)

## 2019-11-19 LAB — GLUCOSE, CAPILLARY: Glucose-Capillary: 105 mg/dL — ABNORMAL HIGH (ref 70–99)

## 2019-11-19 MED ORDER — FENTANYL CITRATE (PF) 100 MCG/2ML IJ SOLN
INTRAMUSCULAR | Status: AC | PRN
Start: 2019-11-19 — End: 2019-11-19
  Administered 2019-11-19 (×2): 50 ug via INTRAVENOUS

## 2019-11-19 MED ORDER — MIDAZOLAM HCL 2 MG/2ML IJ SOLN
INTRAMUSCULAR | Status: AC | PRN
  Administered 2019-11-19 (×2): 1 mg via INTRAVENOUS

## 2019-11-19 MED ORDER — MIDAZOLAM HCL 2 MG/2ML IJ SOLN
INTRAMUSCULAR | Status: AC
  Filled 2019-11-19: qty 4

## 2019-11-19 MED ORDER — SODIUM CHLORIDE 0.9 % IV SOLN: INTRAVENOUS | Status: DC

## 2019-11-19 MED ORDER — GELATIN ABSORBABLE 12-7 MM EX MISC
CUTANEOUS | Status: AC
  Filled 2019-11-19: qty 1

## 2019-11-19 MED ORDER — LIDOCAINE HCL (PF) 1 % IJ SOLN
INTRAMUSCULAR | Status: AC
  Filled 2019-11-19: qty 30

## 2019-11-19 MED ORDER — FENTANYL CITRATE (PF) 100 MCG/2ML IJ SOLN
INTRAMUSCULAR | Status: DC
Start: 2019-11-19 — End: 2019-11-20
  Filled 2019-11-19: qty 4

## 2019-11-19 NOTE — Discharge Instructions (Signed)

## 2019-11-19 NOTE — H&P (Signed)
Chief Complaint: Liver lesions. Request is for liver biopsy  Referring Physician(s): Katragadda,Sreedhar  Supervising Physician: Aletta Edouard  Patient Status: [facility] - Out-pt  History of Present Illness: Krista Doyle is a 59 y.o. female  History of DM, HLD, HTN, neuropathy breast cancer (left) s/p lumpectomy  Patient was seen at the ED at [facility] on 10.1 for intermittent left upper abdominal pain x 3 weeks. CT abdomen from 10.2.21 reads Extensive hypodense lesions throughout the liver parenchyma, concerning for hepatic metastases. Team is requesting liver biopsy for further determination of metastases.   Past Medical History:  Diagnosis Date  . Asthma   . Back pain   . Breast cancer ([place]) 2008   left  . Diabetes ([place])   . Hypercholesterolemia   . Hypertension   . 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 Personal history of chemotherapy   . Personal history of radiation therapy 2008    Past Surgical History:  Procedure Laterality Date  . BACK SURGERY    . BREAST LUMPECTOMY    . COLONOSCOPY     about 3 yrs ago in [place]  . KNEE SURGERY     right  . TONSILLECTOMY      Allergies: Other and Pollen extract  Medications: Prior to Admission medications   Medication Sig Start Date End Date Taking? Authorizing Provider  albuterol (PROVENTIL HFA;VENTOLIN HFA) 108 (90 Base) MCG/ACT inhaler Inhale 1 puff into the lungs every 6 (six) hours as needed for wheezing or shortness of breath.   Yes [provider]  amitriptyline (ELAVIL) 10 MG tablet 1-2 tablets at bedtime every day Patient taking differently: Take 10-20 mg by mouth at bedtime.  04/08/19  Yes Bayard Hugger, NP  atorvastatin (LIPITOR) 20 MG tablet Take 20 mg by mouth daily.   Yes [provider]  budesonide-formoterol (SYMBICORT) 160-4.5 MCG/ACT inhaler Inhale 2 puffs into the lungs 2 (two) times daily as needed (shortness of breath).    Yes [provider]  calcium carbonate  (OS-CAL - DOSED IN MG OF ELEMENTAL CALCIUM) 1250 (500 Ca) MG tablet Take 1,250 mg by mouth daily with breakfast.    Yes Penland, Kelby Fam, MD  ergocalciferol (VITAMIN D2) 1.25 MG (50000 UT) capsule Take 1 capsule (50,000 Units total) by mouth once a week. Patient taking differently: Take 50,000 Units by mouth every Sunday.  03/28/19  Yes Lockamy, Randi L, NP-C  fentaNYL (DURAGESIC) 12 MCG/HR Place 1 patch onto the skin every 3 (three) days. 10/31/19  Yes Bayard Hugger, NP  LANTUS SOLOSTAR 100 UNIT/ML Solostar Pen Inject 24 Units into the skin at bedtime.  08/25/19  Yes [provider]  letrozole (FEMARA) 2.5 MG tablet Take 1 tablet (2.5 mg total) by mouth daily. 09/25/18  Yes Lockamy, Randi L, NP-C  loratadine (CLARITIN) 10 MG tablet Take 10 mg by mouth daily as needed for allergies.   Yes [provider]  losartan (COZAAR) 100 MG tablet Take 100 mg daily by mouth.   Yes [provider]  meclizine (ANTIVERT) 25 MG tablet Take 25 mg by mouth 2 (two) times daily as needed for dizziness.  03/14/19  Yes [provider]  megestrol (MEGACE) 20 MG tablet Take 20 mg by mouth 2 (two) times daily.   Yes [provider]  methocarbamol (ROBAXIN) 500 MG tablet TAKE 1 TABLET BY MOUTH EVERY 6 HOURS AS NEEDED FOR MUSCLE SPASMS. Patient taking differently: Take 500 mg by mouth every 6 (six) hours as  needed for muscle spasms.  06/04/19  Yes Bayard Hugger, NP  Multiple Vitamins-Minerals (PRESERVISION AREDS 2) CAPS Take 1 capsule by mouth daily.   Yes [provider]  naloxegol oxalate (MOVANTIK) 25 MG TABS tablet Take 1 tablet (25 mg total) by mouth daily. 01/13/17  Yes Bayard Hugger, NP  ondansetron (ZOFRAN) 4 MG tablet Take 4 mg by mouth 2 (two) times daily.  10/29/19  Yes [provider]  oxyCODONE-acetaminophen (PERCOCET) 10-325 MG tablet Take 1 tablet by mouth every 6 (six) hours as needed for pain. 10/31/19  Yes Bayard Hugger, NP  pregabalin  (LYRICA) 100 MG capsule Take 1 capsule (100 mg total) by mouth 2 (two) times daily. 10/31/19  Yes Bayard Hugger, NP  senna (SENOKOT) 8.6 MG TABS tablet Take 8.6 mg by mouth daily.    Yes [provider]  BD PEN NEEDLE NANO 2ND GEN 32G X 4 MM MISC SMARTSIG:Injection Daily 09/19/19   [provider]  Blood Glucose Monitoring Suppl (ONETOUCH VERIO REFLECT) w/Device KIT USE TO TEST BLOOD SUGAR 2 TIMES A DAY 01/19/19   [provider]  Lancets (ONETOUCH DELICA PLUS CHENID78E) [place] USE TO TEST BLOOD SUGAR 2 TIMES A DAY 01/19/19   [provider]  ONETOUCH VERIO test strip USE TO TEST BLOOD SUGAR 2 TIMES A DAY 01/19/19   [provider]     Family History  Problem Relation Age of Onset  . Colon cancer Neg Hx     Social History   Socioeconomic History  . Marital status: Married    Spouse name: Not on file  . Number of children: Not on file  . Years of education: Not on file  . Highest education level: Not on file  Occupational History  . Occupation: Disabled  Tobacco Use  . Smoking status: Never Smoker  . Smokeless tobacco: Never Used  Substance and Sexual Activity  . Alcohol use: No    Alcohol/week: 0.0 standard drinks  . Drug use: No  . Sexual activity: Yes  Other Topics Concern  . Not on file  Social History Narrative  . Not on file   Social Determinants of Health   Financial Resource Strain:   . Difficulty of Paying Living Expenses: Not on file  Food Insecurity:   . Worried About Charity fundraiser in the Last Year: Not on file  . Ran Out of Food in the Last Year: Not on file  Transportation Needs:   . Lack of Transportation (Medical): Not on file  . Lack of Transportation (Non-Medical): Not on file  Physical Activity:   . Days of Exercise per Week: Not on file  . Minutes of Exercise per Session: Not on file  Stress:   . Feeling of Stress : Not on file  Social Connections:   . Frequency of Communication with Friends and  Family: Not on file  . Frequency of Social Gatherings with Friends and Family: Not on file  . Attends Religious Services: Not on file  . Active Member of Clubs or Organizations: Not on file  . Attends Archivist Meetings: Not on file  . Marital Status: Not on file     Review of Systems: A 12 point ROS discussed and pertinent positives are indicated in the HPI above.  All other systems are negative.  Review of Systems  Constitutional: Negative for fatigue and fever.  HENT: Negative for congestion.   Respiratory: Negative for cough and shortness of breath.  Gastrointestinal: Negative for abdominal pain, diarrhea, nausea and vomiting.  Musculoskeletal: Positive for back pain.    Vital Signs: There were no vitals taken for this visit.  Physical Exam Vitals and nursing note reviewed.  Constitutional:      Appearance: She is well-developed.  HENT:     Head: Normocephalic and atraumatic.  Eyes:     Conjunctiva/sclera: Conjunctivae normal.  Cardiovascular:     Rate and Rhythm: Normal rate and regular rhythm.     Heart sounds: Normal heart sounds.  Pulmonary:     Effort: Pulmonary effort is normal.     Breath sounds: Normal breath sounds.  Musculoskeletal:        General: Normal range of motion.     Cervical back: Normal range of motion.  Skin:    General: Skin is warm.  Neurological:     Mental Status: She is alert and oriented to person, place, and time.     Imaging: CT Chest W Contrast  Result Date: 11/13/2019 CLINICAL DATA:  Breast cancer, recent weight loss and decreased appetite. Liver lesions on recent CT abdomen pelvis. EXAM: CT CHEST WITH CONTRAST TECHNIQUE: Multidetector CT imaging of the chest was performed during intravenous contrast administration. CONTRAST:  50m OMNIPAQUE IOHEXOL 300 MG/ML  SOLN COMPARISON:  CT abdomen pelvis 11/02/2019, MR lumbar spine 10/24/2019. FINDINGS: Cardiovascular: Extensive mural thickening and severe luminal narrowing  involving the left subclavian artery. Heart size normal. No pericardial effusion. Mediastinum/Nodes: Mildly hypodense left thyroid nodule measures 1.3 cm. No follow-up recommended (ref: J Am Coll Radiol. 2015 Feb;12(2): 143-50).No pathologically enlarged mediastinal lymph nodes. Left hilar lymph node measures 1.4 x 1.6 cm. Right axillary lymph nodes measure up to 8 mm. Surgical clips in the left axilla. Soft tissue mass in the epicardial fat along the cardiac apex measures 2.0 x 2.7 cm (2/102). Esophagus is grossly unremarkable. Lungs/Pleura: Diffuse perilymphatic nodularity bilaterally. Extensive pleural/subpleural nodularity in the left hemithorax. Index subpleural nodule in the anterior left upper lobe measures 1.1 x 1.9 cm (4/50). No pleural fluid. Airway is unremarkable. Upper Abdomen: Multiple heterogeneous thick-walled lesions throughout the liver, seen in their entirety on 11/02/2019. Index lesion in the dome of the liver measures 1.9 x 2.1 cm (2/107). Visualized portion of the gallbladder is unremarkable. Slight nodular thickening of both adrenal glands. Subcentimeter low-attenuation lesion in the left kidney is likely a cyst. Visualized portions of the kidneys, spleen, pancreas, stomach and bowel are grossly unremarkable. Periportal lymph nodes measure up to 11 mm. Gastrohepatic ligament lymph nodes, 5 mm. Musculoskeletal: Mottled sclerotic metastases are seen throughout the visualized osseous structures. Healing or healed pathologic right rib fractures. Pathologic superior endplate compression fracture involving T12. Nodular soft tissue in the lateral left breast measures 1.1 x 1.7 cm (2/60). IMPRESSION: 1. Widespread pleuroparenchymal, left hilar, hepatic and osseous metastatic disease. Lateral left breast mass may be postoperative in etiology. Disease recurrence cannot be excluded. 2. Extensive mural thickening and severe luminal narrowing involving the left subclavian artery. Electronically Signed    By: MLorin PicketM.D.   On: 11/13/2019 09:54   MR Thoracic Spine W Wo Contrast  Result Date: 11/18/2019 CLINICAL DATA:  Breast cancer EXAM: MRI THORACIC WITHOUT AND WITH CONTRAST TECHNIQUE: Multiplanar and multiecho pulse sequences of the thoracic spine were obtained without and with intravenous contrast. CONTRAST:  528mGADAVIST GADOBUTROL 1 MMOL/ML IV SOLN COMPARISON:  10/24/2019 lumbar spine MRI FINDINGS: Cervical Localizer sequence is motion degraded. Therefore counting may not be accurate. Alignment: Trace retrolisthesis at T10-T11. Vertebrae:  Heterogeneous STIR hyperintensity and enhancement throughout the thoracic spine. Mild compression deformity of T12 is unchanged since prior lumbar MRI. There is additional endplate irregularity and mild loss of height at T9 and T10. There is no significant epidural extension. Cord:  No abnormal signal.  No abnormal intrathecal enhancement. Paraspinal and other soft tissues: Extra-spinal metastatic disease is partially imaged and present on prior cross-sectional imaging. Disc levels: Mild multilevel degenerative disc disease and facet arthropathy. There is no high-grade degenerative stenosis. IMPRESSION: Diffuse osseous metastatic disease. No significant epidural extension. No definite acute compression deformity. Electronically Signed   By: Macy Mis M.D.   On: 11/18/2019 14:55   MR Lumbar Spine W Wo Contrast  Result Date: 10/25/2019 CLINICAL DATA:  Lumbar pain. Radicular pain of right lower extremity. Additional history provided by scanning technologist: Patient reports low back pain radiating down right leg for 1.5 years. EXAM: MRI LUMBAR SPINE WITHOUT AND WITH CONTRAST TECHNIQUE: Multiplanar and multiecho pulse sequences of the lumbar spine were obtained without and with intravenous contrast. CONTRAST:  27m GADAVIST GADOBUTROL 1 MMOL/ML IV SOLN COMPARISON:  Radiographs of the lumbar spine 07/10/2019. FINDINGS: Segmentation: 5 lumbar vertebrae. The  lowest well-formed intervertebral disc space is designated L5-S1. Alignment: Trace L3-L4 grade 1 anterolisthesis. 7 mm grade 2 L4-L5 grade 1 anterolisthesis. Vertebrae: Redemonstrated mild chronic T12 superior endplate deformity. Vertebral body height is otherwise maintained. Diffuse abnormal T1 hypointense marrow signal throughout the lumbar and visualized lower thoracic spine. Nonspecific multilevel degenerative endplate irregularity. Schmorl nodes at the L5-S1 level. The sagittal T1 fat saturated postcontrast sequence is limited by motion degradation. There is multifocal abnormal osseous enhancement suspicious for widespread osseous metastatic disease most notably as follows. There is abnormal enhancement throughout much of the T12 vertebral body and posterior elements. Patchy abnormal enhancement is also present within the T10 and T11 vertebrae. There are additional small patchy foci of enhancement within the L4 and L5 vertebral bodies and posterior elements. Abnormal ill-defined T2/STIR hyperintensity and enhancement within the visualized sacrum. Additional metastases are suspected within the bilateral iliac bones, including a subcentimeter circumscribed metastasis within the right iliac bone (series 11, image 30). Conus medullaris and cauda equina: Conus extends to the L2 level. No signal abnormality within the visualized distal spinal cord. No abnormal enhancement of the visualized spinal cord or cauda equina nerve roots. Paraspinal and other soft tissues: Subcentimeter T2 hyperintense lesion within the left kidney, incompletely assessed but possibly reflecting a cyst. Atrophy of the lumbar paraspinal musculature. Disc levels: Multilevel disc degeneration most notably as follows. Moderate/advanced disc degeneration at L4-L5. Mild/moderate disc degeneration at L3-L4 and L5-S1. T10-T11: This level is imaged sagittally. A disc bulge contributes to mild relative spinal canal narrowing. No significant foraminal  stenosis. T11-T12: This level is imaged sagittally. No significant disc herniation or stenosis. T12-L1: This level is imaged sagittally. No significant disc herniation or stenosis. L1-L2: Disc bulge. Moderate facet arthrosis with ligamentum flavum hypertrophy. No significant spinal canal stenosis or neural foraminal narrowing. L2-L3: Moderate facet arthrosis with ligamentum flavum hypertrophy. No significant disc herniation or stenosis. L3-L4: Trace anterolisthesis. Disc uncovering with disc bulge. Moderate/advanced facet arthrosis. Moderate/severe central canal stenosis and left greater than right subarticular narrowing. Mild bilateral neural foraminal narrowing L4-L5: Grade 2 anterolisthesis. Disc uncovering with disc bulge. Advanced facet arthrosis with ligamentum flavum hypertrophy. Moderate/severe central canal stenosis with severe bilateral subarticular narrowing. Bilateral neural foraminal narrowing (moderate right, mild left). L5-S1: Disc bulge. Osteophyte ridge greatest along the right lateral aspect of the disc space.  Moderate facet arthrosis (greater on the right). No significant spinal canal stenosis. Mild/moderate right neural foraminal narrowing. IMPRESSION: Multifocal osseous signal abnormality and enhancement as described and suspicious for widespread osseous metastatic disease. Background diffuse abnormal T1 hypointense marrow signal which is nonspecific, but may reflect treatment effect and/or anemia. Lumbar spondylosis as outlined with findings most notably as follows. At L3-L4, there is moderate disc degeneration. Grade 1 anterolisthesis. Multifactorial moderate/severe central canal stenosis and left greater than right subarticular narrowing. Mild bilateral neural foraminal narrowing. At L4-L5, there is moderate/advanced disc degeneration. Grade 2 anterolisthesis. Multifactorial moderate/severe central canal stenosis with severe bilateral subarticular narrowing. Bilateral neural foraminal  narrowing (moderate right, mild left). Multifactorial mild/moderate L5-S1 right neural foraminal narrowing. Electronically Signed   By: Kellie Simmering DO   On: 10/25/2019 07:58   CT Abdomen Pelvis W Contrast  Result Date: 11/02/2019 CLINICAL DATA:  Abdominal pain, history of breast cancer EXAM: CT ABDOMEN AND PELVIS WITH CONTRAST TECHNIQUE: Multidetector CT imaging of the abdomen and pelvis was performed using the standard protocol following bolus administration of intravenous contrast. CONTRAST:  116m OMNIPAQUE IOHEXOL 300 MG/ML  SOLN COMPARISON:  None. FINDINGS: Lower chest: The visualized heart size within normal limits. No pericardial fluid/thickening. No hiatal hernia. Multiple pulmonary nodules are seen in the visualized left lung base. The largest posteriorly measures 1 cm in transverse dimension. Hepatobiliary: Extensive heterogeneous hypodense lesions are seen throughout the liver parenchyma. The portal vein is patent. No evidence of calcified gallstones, gallbladder wall thickening or biliary dilatation. Pancreas: Unremarkable. No pancreatic ductal dilatation or surrounding inflammatory changes. Spleen: Normal in size without focal abnormality. Adrenals/Urinary Tract: Both adrenal glands appear normal. There is a punctate calcifications seen in the lower pole of the left kidney. No hydronephrosis is noted. Bladder is unremarkable. Stomach/Bowel: The stomach, small bowel, and colon are normal in appearance. No inflammatory changes, wall thickening, or obstructive findings.The appendix is normal. Vascular/Lymphatic: There are no enlarged mesenteric, retroperitoneal, or pelvic lymph nodes. Scattered aortic atherosclerotic calcifications are seen without aneurysmal dilatation. Reproductive: The uterus and adnexa are unremarkable. Other: No evidence of abdominal wall mass or hernia. Musculoskeletal: Extensive mixed lytic and blastic osseous lesions are seen throughout. Since the prior exam there appears to  be a slight anterior wedge compression deformity of the T12 vertebral body with less than 25% loss in vertebral body height. IMPRESSION: Extensive hypodense lesions throughout the liver parenchyma, concerning for hepatic metastases. Pulmonary nodules at the left lung base, concerning for pulmonary metastases. Extensive osseous metastatic lesions. Since the prior exam subacute anterior wedge compression deformity of the T12 vertebral body. Aortic Atherosclerosis (ICD10-I70.0). Electronically Signed   By: BPrudencio PairM.D.   On: 11/02/2019 03:39    Labs:  CBC: Recent Labs    03/27/19 1257 09/20/19 1118 11/01/19 1942  WBC 9.8 7.4 11.2*  HGB 11.2* 9.5* 10.5*  HCT 36.0 31.7* 34.1*  PLT 326 350 416*    COAGS: No results for input(s): INR, APTT in the last 8760 hours.  BMP: Recent Labs    03/27/19 1257 09/10/19 1338 09/20/19 1118 11/01/19 1942  NA 134* 139 141 132*  K 3.9 4.2 3.2* 3.4*  CL 95* 100 102 93*  CO2 28 33* 29 24  GLUCOSE 209* 160* 131* 279*  BUN _0 CALCIUM 9.9 10.3 10.0 9.1  CREATININE 0.89 1.06* 0.81 0.81  GFRNONAA >60 57* >60 >60  GFRAA >60 66 >60 >60    LIVER FUNCTION TESTS: Recent Labs    03/27/19  1257 09/10/19 1338 09/20/19 1118 11/01/19 1942  BILITOT 0.5 0.2 0.2* 0.5  AST 36 29 31 47*  ALT _0 ALKPHOS 75  --  157* 266*  PROT 8.7* 7.5 8.0 9.0*  ALBUMIN 3.5  --  2.9* 2.9*    Assessment and Plan:  59 y.o. female outpatient. History of DM, HLD, HTN, neuropathy breast cancer (left) s/p lumpectomy  Patient was seen at the ED at [facility] on 10.1 for intermittent left upper abdominal pain x 3 weeks. CT abdomen from 10.2.21 reads Extensive hypodense lesions throughout the liver parenchyma, concerning for hepatic metastases. Team is requesting liver biopsy for further determination of metastases.  No pertinent allergies. No recent CBC ., CMP from 10.1 AST 47,Alkaline phosphatase 266, Potassium 3.4. All medications are within acceptable  parameters. Patient has been NPO since midnight  Thank you for this interesting consult.  I greatly enjoyed meeting LATAVIA GOGA and look forward to participating in their care.  A copy of this report was sent to the requesting provider on this date.  Electronically Signed: Jacqualine Mau, NP 11/19/2019, 8:32 AM   I spent a total of  30 Minutes   in face to face in clinical consultation, greater than 50% of which was counseling/coordinating care for liver biopsy

## 2019-11-19 NOTE — Procedures (Signed)
Interventional Radiology Procedure Note  Procedure: US Guided Biopsy of liver  Complications: None  Estimated Blood Loss: < 10 mL  Findings: 18 G core biopsy of liver lesion performed under US guidance.  Three core samples obtained and sent to Pathology.  Venetia Night. Kathlene Cote, M.D Pager:  [phone]

## 2019-11-22 ENCOUNTER — Other Ambulatory Visit (HOSPITAL_COMMUNITY): Payer: Self-pay | Admitting: *Deleted

## 2019-11-22 ENCOUNTER — Ambulatory Visit (HOSPITAL_COMMUNITY): Payer: Medicare Other

## 2019-11-22 MED ORDER — LACTULOSE 20 GM/30ML PO SOLN: ORAL | 2 refills | Status: AC

## 2019-11-22 NOTE — Progress Notes (Signed)
.  Nutrition Assessment:  Referral for weight loss.  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  Awaiting biopsy results.  History of left breast cancer treated in [place], [place], osteopenia.  Spoke with patient via phone.  Patient reports that she has no appetite and has not for awhile.  Reports some issues with nausea but has been taking zofran with relief.  Reports appetite stimulant has increased appetite maybe a little bit.  Has not had a bowel movement in the last 3 days.  Takes laxative BID.  Reports back pain and pain from recent biopsy.  Reports that she has been eating a egg, cheese, sausage on english muffin for breakfast.  Lunch maybe a wonton soup and sometimes does not eat dinner.  Asking about ensure/boost shakes.      Medications: reviewed  Labs: reviewed  Anthropometrics:   Height: 63 inches Weight: 109 lb 10/19 UBW: 134 lb BMI: 19  19% weight loss in the last month, signficant   Estimated Energy Needs  Kcals: 1500-1750 Protein: 75-88 g Fluid: > 1.5 L  NUTRITION DIAGNOSIS: Inadequate oral intake related to cancer, nausea, likely pain as evidenced by 19% weight loss in the last month and poor appetite    INTERVENTION:  Discussed ways to add calories and protein to current eating pattern.  Will provided handout. Encouraged small frequent meals Discussed lack of bowel movement with RN, Diane and she will call patient.  Discussed oral nutrition supplements.  Will provide  Complimentary case of ensure enlive for patient to pick up on 10/28.  Will provide recipes for smoothies, etc.   Contact information will be provided    MONITORING, EVALUATION, GOAL: weight trends, intake   NEXT VISIT: November 19 phone f/u  Leveon Pelzer B. [name], [place], [place] Registered Dietitian [phone] (mobile)

## 2019-11-22 NOTE — Telephone Encounter (Signed)
Report received from [facility], nutritionist.  She has not had a bowel movement in 4 days, despite laxatives.  Prescription sent to pharmacy for lactulose.  Patient aware of directions and verbalizes understanding.

## 2019-11-25 ENCOUNTER — Ambulatory Visit (HOSPITAL_COMMUNITY): Payer: Medicare Other | Admitting: Hematology

## 2019-11-25 LAB — SURGICAL PATHOLOGY

## 2019-11-28 ENCOUNTER — Inpatient Hospital Stay (HOSPITAL_COMMUNITY): Payer: BC Managed Care – PPO

## 2019-11-28 ENCOUNTER — Telehealth: Payer: Self-pay | Admitting: Hematology

## 2019-11-28 ENCOUNTER — Inpatient Hospital Stay (HOSPITAL_BASED_OUTPATIENT_CLINIC_OR_DEPARTMENT_OTHER): Payer: BC Managed Care – PPO | Admitting: Hematology

## 2019-11-28 ENCOUNTER — Other Ambulatory Visit (HOSPITAL_COMMUNITY): Payer: Self-pay | Admitting: *Deleted

## 2019-11-28 ENCOUNTER — Other Ambulatory Visit: Payer: Self-pay

## 2019-11-28 VITALS — BP 100/72 | HR 96 | Temp 97.2°F | Resp 18

## 2019-11-28 DIAGNOSIS — K5900 Constipation, unspecified: Secondary | ICD-10-CM | POA: Diagnosis not present

## 2019-11-28 DIAGNOSIS — Z170 Estrogen receptor positive status [ER+]: Secondary | ICD-10-CM

## 2019-11-28 DIAGNOSIS — C50919 Malignant neoplasm of unspecified site of unspecified female breast: Secondary | ICD-10-CM | POA: Insufficient documentation

## 2019-11-28 DIAGNOSIS — C50412 Malignant neoplasm of upper-outer quadrant of left female breast: Secondary | ICD-10-CM

## 2019-11-28 DIAGNOSIS — Z79811 Long term (current) use of aromatase inhibitors: Secondary | ICD-10-CM | POA: Diagnosis not present

## 2019-11-28 DIAGNOSIS — Z79899 Other long term (current) drug therapy: Secondary | ICD-10-CM | POA: Diagnosis not present

## 2019-11-28 DIAGNOSIS — R112 Nausea with vomiting, unspecified: Secondary | ICD-10-CM | POA: Diagnosis not present

## 2019-11-28 DIAGNOSIS — C7951 Secondary malignant neoplasm of bone: Secondary | ICD-10-CM | POA: Diagnosis not present

## 2019-11-28 DIAGNOSIS — G479 Sleep disorder, unspecified: Secondary | ICD-10-CM | POA: Diagnosis not present

## 2019-11-28 DIAGNOSIS — M8580 Other specified disorders of bone density and structure, unspecified site: Secondary | ICD-10-CM | POA: Diagnosis not present

## 2019-11-28 DIAGNOSIS — C787 Secondary malignant neoplasm of liver and intrahepatic bile duct: Secondary | ICD-10-CM | POA: Diagnosis not present

## 2019-11-28 DIAGNOSIS — R918 Other nonspecific abnormal finding of lung field: Secondary | ICD-10-CM | POA: Diagnosis not present

## 2019-11-28 DIAGNOSIS — M549 Dorsalgia, unspecified: Secondary | ICD-10-CM | POA: Diagnosis not present

## 2019-11-28 DIAGNOSIS — Z7189 Other specified counseling: Secondary | ICD-10-CM

## 2019-11-28 DIAGNOSIS — R1012 Left upper quadrant pain: Secondary | ICD-10-CM | POA: Diagnosis not present

## 2019-11-28 DIAGNOSIS — R0789 Other chest pain: Secondary | ICD-10-CM | POA: Diagnosis not present

## 2019-11-28 DIAGNOSIS — M2550 Pain in unspecified joint: Secondary | ICD-10-CM | POA: Diagnosis not present

## 2019-11-28 LAB — CBC WITH DIFFERENTIAL/PLATELET
Abs Immature Granulocytes: 0.05 10*3/uL (ref 0.00–0.07)
Basophils Absolute: 0 10*3/uL (ref 0.0–0.1)
Basophils Relative: 0 %
Eosinophils Absolute: 0.1 10*3/uL (ref 0.0–0.5)
Eosinophils Relative: 1 %
HCT: 34.3 % — ABNORMAL LOW (ref 36.0–46.0)
Hemoglobin: 10.7 g/dL — ABNORMAL LOW (ref 12.0–15.0)
Immature Granulocytes: 1 %
Lymphocytes Relative: 32 %
Lymphs Abs: 3.1 10*3/uL (ref 0.7–4.0)
MCH: 26 pg (ref 26.0–34.0)
MCHC: 31.2 g/dL (ref 30.0–36.0)
MCV: 83.3 fL (ref 80.0–100.0)
Monocytes Absolute: 0.8 10*3/uL (ref 0.1–1.0)
Monocytes Relative: 9 %
Neutro Abs: 5.5 10*3/uL (ref 1.7–7.7)
Neutrophils Relative %: 57 %
Platelets: 449 10*3/uL — ABNORMAL HIGH (ref 150–400)
RBC: 4.12 MIL/uL (ref 3.87–5.11)
RDW: 18.8 % — ABNORMAL HIGH (ref 11.5–15.5)
WBC: 9.5 10*3/uL (ref 4.0–10.5)
nRBC: 0.7 % — ABNORMAL HIGH (ref 0.0–0.2)

## 2019-11-28 LAB — FERRITIN: Ferritin: 1345 ng/mL — ABNORMAL HIGH (ref 11–307)

## 2019-11-28 LAB — COMPREHENSIVE METABOLIC PANEL
ALT: 15 U/L (ref 0–44)
AST: 53 U/L — ABNORMAL HIGH (ref 15–41)
Albumin: 3.2 g/dL — ABNORMAL LOW (ref 3.5–5.0)
Alkaline Phosphatase: 187 U/L — ABNORMAL HIGH (ref 38–126)
Anion gap: 12 (ref 5–15)
BUN: 30 mg/dL — ABNORMAL HIGH (ref 8–23)
CO2: 28 mmol/L (ref 22–32)
Calcium: >15 mg/dL (ref 8.9–10.3)
Chloride: 95 mmol/L — ABNORMAL LOW (ref 98–111)
Creatinine, Ser: 1.6 mg/dL — ABNORMAL HIGH (ref 0.44–1.00)
GFR, Estimated: 36 mL/min — ABNORMAL LOW (ref >60)
Glucose, Bld: 119 mg/dL — ABNORMAL HIGH (ref 70–99)
Potassium: 3.4 mmol/L — ABNORMAL LOW (ref 3.5–5.1)
Sodium: 135 mmol/L (ref 135–145)
Total Bilirubin: 0.4 mg/dL (ref 0.3–1.2)
Total Protein: 9.2 g/dL — ABNORMAL HIGH (ref 6.5–8.1)

## 2019-11-28 LAB — IRON AND TIBC
Iron: 102 ug/dL (ref 28–170)
Saturation Ratios: 26 % (ref 10.4–31.8)
TIBC: 387 ug/dL (ref 250–450)
UIBC: 285 ug/dL

## 2019-11-28 LAB — VITAMIN B12: Vitamin B-12: >7500 pg/mL — ABNORMAL HIGH (ref 180–914)

## 2019-11-28 LAB — FOLATE: Folate: 16.6 ng/mL (ref >5.9)

## 2019-11-28 MED ORDER — HYDROMORPHONE HCL 2 MG PO TABS
2.0000 mg | ORAL_TABLET | ORAL | 0 refills | Status: DC | PRN
Start: 2019-11-28 — End: 2019-12-06

## 2019-11-28 MED ORDER — FENTANYL 25 MCG/HR TD PT72: 1.0000 | MEDICATED_PATCH | TRANSDERMAL | 0 refills | Status: DC

## 2019-11-28 MED ORDER — SODIUM CHLORIDE 0.9 % IV SOLN: INTRAVENOUS | Status: DC

## 2019-11-28 MED ORDER — ZOLEDRONIC ACID 4 MG/100ML IV SOLN
4.0000 mg | Freq: Once | INTRAVENOUS | Status: AC
  Administered 2019-11-28: 4 mg via INTRAVENOUS

## 2019-11-28 NOTE — Progress Notes (Signed)
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 Completed 1 year of Herceptin. 2. Tamoxifen for 5 years and Femara for 5 years.  NGS Results: ER/PR/HER-2 positive  CURRENT THERAPY: Docetaxel and Herceptin to begin  BRIEF ONCOLOGIC HISTORY:  Oncology History   No history exists.    CANCER STAGING: Cancer Staging No matching staging information was found for the patient.  INTERVAL HISTORY:  Krista Doyle, a 59 y.o. female, returns for routine follow-up of her metastatic left breast cancer to liver. Krista Doyle was last seen on 11/05/2019.   Today she is accompanied by her husband and she reports feeling horribly. She reports having pain in her posterior neck, back and right leg; her neck has been hurting for the past 2 weeks after having a forceful sneeze. She uses a fentanyl patch every 3 days and uses Percocet 10 mg QID for her breakthrough pain; the fentanyl patch helps a little while the Percocet is only helping for 1 hour. She denies feeling drowsy though she is constipated all the time. She is taking Miralax. Her appetite is not good. She denies having any cardiac issues after her last Herceptin treatment.  She is able to do some of her chores at home and ambulates with a walker.   REVIEW OF SYSTEMS:  Review of Systems  Constitutional: Positive for appetite change (60%) and fatigue (50%).  HENT:   Positive for trouble swallowing.   Gastrointestinal: Positive for constipation.  Musculoskeletal: Positive for arthralgias (R leg pain) and neck pain (10/10 neck and back pain).  All other systems reviewed and are negative.   PAST MEDICAL/SURGICAL HISTORY:  Past Medical History:  Diagnosis Date  . Asthma   . Back pain   .  Breast cancer ([facility]) 2008   left  . Diabetes ([facility])   . Hypercholesterolemia   . Hypertension   . 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 Personal history of chemotherapy   . Personal history of radiation therapy 2008   Past Surgical History:  Procedure Laterality Date  . BACK SURGERY    . BREAST LUMPECTOMY    . COLONOSCOPY     about 3 yrs ago in [place]  . KNEE SURGERY     right  . TONSILLECTOMY      SOCIAL HISTORY:  Social History   Socioeconomic History  . Marital status: Married    Spouse name: Not on file  . Number of children: Not on file  . Years of education: Not on file  . Highest education level: Not on file  Occupational History  . Occupation: Disabled  Tobacco Use  . Smoking status: Never Smoker  . Smokeless tobacco: Never Used  Substance and Sexual Activity  . Alcohol use: No    Alcohol/week: 0.0 standard drinks  . Drug use: No  . Sexual activity: Yes  Other Topics Concern  . Not on file  Social History Narrative  . Not on file   Social Determinants of Health   Financial Resource Strain:   . Difficulty of Paying Living Expenses: Not on file  Food Insecurity:   . Worried About Charity fundraiser in the Last Year: Not on file  . Ran Out of Food in the Last  Year: Not on file  Transportation Needs:   . Lack of Transportation (Medical): Not on file  . Lack of Transportation (Non-Medical): Not on file  Physical Activity:   . Days of Exercise per Week: Not on file  . Minutes of Exercise per Session: Not on file  Stress:   . Feeling of Stress : Not on file  Social Connections:   . Frequency of Communication with Friends and Family: Not on file  . Frequency of Social Gatherings with Friends and Family: Not on file  . Attends Religious Services: Not on file  . Active Member of Clubs or Organizations: Not on file  . Attends Archivist Meetings: Not on file  . Marital Status: Not on file  Intimate Partner Violence:   . Fear of Current  or Ex-Partner: Not on file  . Emotionally Abused: Not on file  . Physically Abused: Not on file  . Sexually Abused: Not on file    FAMILY HISTORY:  Family History  Problem Relation Age of Onset  . Colon cancer Neg Hx     CURRENT MEDICATIONS:  Current Outpatient Medications  Medication Sig Dispense Refill  . albuterol (PROVENTIL HFA;VENTOLIN HFA) 108 (90 Base) MCG/ACT inhaler Inhale 1 puff into the lungs every 6 (six) hours as needed for wheezing or shortness of breath.    Marland Kitchen amitriptyline (ELAVIL) 10 MG tablet 1-2 tablets at bedtime every day (Patient taking differently: Take 10-20 mg by mouth at bedtime. ) 180 tablet 2  . atorvastatin (LIPITOR) 20 MG tablet Take 20 mg by mouth daily.    . BD PEN NEEDLE NANO 2ND GEN 32G X 4 MM MISC SMARTSIG:Injection Daily    . Blood Glucose Monitoring Suppl (ONETOUCH VERIO REFLECT) w/Device KIT USE TO TEST BLOOD SUGAR 2 TIMES A DAY    . budesonide-formoterol (SYMBICORT) 160-4.5 MCG/ACT inhaler Inhale 2 puffs into the lungs 2 (two) times daily as needed (shortness of breath).     . calcium carbonate (OS-CAL - DOSED IN MG OF ELEMENTAL CALCIUM) 1250 (500 Ca) MG tablet Take 1,250 mg by mouth daily with breakfast.     . ergocalciferol (VITAMIN D2) 1.25 MG (50000 UT) capsule Take 1 capsule (50,000 Units total) by mouth once a week. (Patient taking differently: Take 50,000 Units by mouth every Sunday. ) 16 capsule 4  . Lactulose 20 GM/30ML SOLN Please take 30 ml every 3 hours until you produce a bowel movement. Then take 30 ml at bedtime daily for constipation. 946 mL 2  . Lancets (ONETOUCH DELICA PLUS [id number]) MISC USE TO TEST BLOOD SUGAR 2 TIMES A DAY    . LANTUS SOLOSTAR 100 UNIT/ML Solostar Pen Inject 24 Units into the skin at bedtime.     Marland Kitchen letrozole (FEMARA) 2.5 MG tablet Take 1 tablet (2.5 mg total) by mouth daily. 90 tablet 3  . loratadine (CLARITIN) 10 MG tablet Take 10 mg by mouth daily as needed for allergies.    Marland Kitchen losartan (COZAAR) 100 MG tablet  Take 100 mg daily by mouth.    . meclizine (ANTIVERT) 25 MG tablet Take 25 mg by mouth 2 (two) times daily as needed for dizziness.     . megestrol (MEGACE) 20 MG tablet Take 20 mg by mouth 2 (two) times daily.    . methocarbamol (ROBAXIN) 500 MG tablet TAKE 1 TABLET BY MOUTH EVERY 6 HOURS AS NEEDED FOR MUSCLE SPASMS. (Patient taking differently: Take 500 mg by mouth every 6 (six) hours as needed for muscle spasms. )  60 tablet 2  . Multiple Vitamins-Minerals (PRESERVISION AREDS 2) CAPS Take 1 capsule by mouth daily.    . naloxegol oxalate (MOVANTIK) 25 MG TABS tablet Take 1 tablet (25 mg total) by mouth daily. 30 tablet 3  . ONETOUCH VERIO test strip USE TO TEST BLOOD SUGAR 2 TIMES A DAY    . oxyCODONE-acetaminophen (PERCOCET) 10-325 MG tablet Take 1 tablet by mouth every 6 (six) hours as needed for pain. 120 tablet 0  . pregabalin (LYRICA) 100 MG capsule Take 1 capsule (100 mg total) by mouth 2 (two) times daily. 60 capsule 3  . senna (SENOKOT) 8.6 MG TABS tablet Take 8.6 mg by mouth daily.     . ondansetron (ZOFRAN) 4 MG tablet Take 4 mg by mouth 2 (two) times daily.  (Patient not taking: Reported on 11/28/2019)     No current facility-administered medications for this visit.    ALLERGIES:  Allergies  Allergen Reactions  . Other     Cat/dog dander  . Pollen Extract     PHYSICAL EXAM:  Performance status (ECOG): 1 - Symptomatic but completely ambulatory  Vitals:   11/28/19 1204  BP: 100/72  Pulse: 96  Resp: 18  Temp: (!) 97.2 F (36.2 C)  SpO2: 96%   Wt Readings from Last 3 Encounters:  11/19/19 109 lb (49.4 kg)  11/14/19 114 lb (51.7 kg)  11/05/19 114 lb 1.6 oz (51.8 kg)   Physical Exam Vitals reviewed.  Constitutional:      Appearance: Normal appearance.  Cardiovascular:     Rate and Rhythm: Regular rhythm. Tachycardia present.     Pulses: Normal pulses.     Heart sounds: Normal heart sounds.  Pulmonary:     Effort: Pulmonary effort is normal.     Breath sounds:  Normal breath sounds.  Abdominal:     Palpations: Abdomen is soft.     Tenderness: There is no abdominal tenderness.  Musculoskeletal:     Cervical back: Tenderness present. No edema or crepitus. Spinous process tenderness (from C8 to T1) present.  Neurological:     General: No focal deficit present.     Mental Status: She is alert and oriented to person, place, and time.  Psychiatric:        Mood and Affect: Mood normal.        Behavior: Behavior normal.      LABORATORY DATA:  I have reviewed the labs as listed.  CBC Latest Ref Rng & Units 11/19/2019 11/01/2019 09/20/2019  WBC 4.0 - 10.5 K/uL 8.1 11.2(H) 7.4  Hemoglobin 12.0 - 15.0 g/dL 8.9(L) 10.5(L) 9.5(L)  Hematocrit 36 - 46 % 30.4(L) 34.1(L) 31.7(L)  Platelets 150 - 400 K/uL 435(H) 416(H) 350   CMP Latest Ref Rng & Units 11/01/2019 09/20/2019 09/10/2019  Glucose 70 - 99 mg/dL 279(H) 131(H) 160(H)  BUN 6 - 20 mg/dL _0 Creatinine 0.44 - 1.00 mg/dL 0.81 0.81 1.06(H)  Sodium 135 - 145 mmol/L 132(L) 141 139  Potassium 3.5 - 5.1 mmol/L 3.4(L) 3.2(L) 4.2  Chloride 98 - 111 mmol/L 93(L) 102 100  CO2 22 - 32 mmol/L 24 29 33(H)  Calcium 8.9 - 10.3 mg/dL 9.1 10.0 10.3  Total Protein 6.5 - 8.1 g/dL 9.0(H) 8.0 7.5  Total Bilirubin 0.3 - 1.2 mg/dL 0.5 0.2(L) 0.2  Alkaline Phos 38 - 126 U/L 266(H) 157(H) -  AST 15 - 41 U/L 47(H) 31 29  ALT 0 - 44 U/L _1 Surgical pathology ([id number])  on 11/19/2019: Right lobe liver mass needle core biopsy: ER/PR/HER-2 positive adenocarcinoma.  DIAGNOSTIC IMAGING:  I have independently reviewed the scans and discussed with the patient. CT Chest W Contrast  Result Date: 11/13/2019 CLINICAL DATA:  Breast cancer, recent weight loss and decreased appetite. Liver lesions on recent CT abdomen pelvis. EXAM: CT CHEST WITH CONTRAST TECHNIQUE: Multidetector CT imaging of the chest was performed during intravenous contrast administration. CONTRAST:  42m OMNIPAQUE IOHEXOL 300 MG/ML  SOLN  COMPARISON:  CT abdomen pelvis 11/02/2019, MR lumbar spine 10/24/2019. FINDINGS: Cardiovascular: Extensive mural thickening and severe luminal narrowing involving the left subclavian artery. Heart size normal. No pericardial effusion. Mediastinum/Nodes: Mildly hypodense left thyroid nodule measures 1.3 cm. No follow-up recommended (ref: J Am Coll Radiol. 2015 Feb;12(2): 143-50).No pathologically enlarged mediastinal lymph nodes. Left hilar lymph node measures 1.4 x 1.6 cm. Right axillary lymph nodes measure up to 8 mm. Surgical clips in the left axilla. Soft tissue mass in the epicardial fat along the cardiac apex measures 2.0 x 2.7 cm (2/102). Esophagus is grossly unremarkable. Lungs/Pleura: Diffuse perilymphatic nodularity bilaterally. Extensive pleural/subpleural nodularity in the left hemithorax. Index subpleural nodule in the anterior left upper lobe measures 1.1 x 1.9 cm (4/50). No pleural fluid. Airway is unremarkable. Upper Abdomen: Multiple heterogeneous thick-walled lesions throughout the liver, seen in their entirety on 11/02/2019. Index lesion in the dome of the liver measures 1.9 x 2.1 cm (2/107). Visualized portion of the gallbladder is unremarkable. Slight nodular thickening of both adrenal glands. Subcentimeter low-attenuation lesion in the left kidney is likely a cyst. Visualized portions of the kidneys, spleen, pancreas, stomach and bowel are grossly unremarkable. Periportal lymph nodes measure up to 11 mm. Gastrohepatic ligament lymph nodes, 5 mm. Musculoskeletal: Mottled sclerotic metastases are seen throughout the visualized osseous structures. Healing or healed pathologic right rib fractures. Pathologic superior endplate compression fracture involving T12. Nodular soft tissue in the lateral left breast measures 1.1 x 1.7 cm (2/60). IMPRESSION: 1. Widespread pleuroparenchymal, left hilar, hepatic and osseous metastatic disease. Lateral left breast mass may be postoperative in etiology. Disease  recurrence cannot be excluded. 2. Extensive mural thickening and severe luminal narrowing involving the left subclavian artery. Electronically Signed   By: MLorin PicketM.D.   On: 11/13/2019 09:54   MR Thoracic Spine W Wo Contrast  Result Date: 11/18/2019 CLINICAL DATA:  Breast cancer EXAM: MRI THORACIC WITHOUT AND WITH CONTRAST TECHNIQUE: Multiplanar and multiecho pulse sequences of the thoracic spine were obtained without and with intravenous contrast. CONTRAST:  564mGADAVIST GADOBUTROL 1 MMOL/ML IV SOLN COMPARISON:  10/24/2019 lumbar spine MRI FINDINGS: Cervical Localizer sequence is motion degraded. Therefore counting may not be accurate. Alignment: Trace retrolisthesis at T10-T11. Vertebrae: Heterogeneous STIR hyperintensity and enhancement throughout the thoracic spine. Mild compression deformity of T12 is unchanged since prior lumbar MRI. There is additional endplate irregularity and mild loss of height at T9 and T10. There is no significant epidural extension. Cord:  No abnormal signal.  No abnormal intrathecal enhancement. Paraspinal and other soft tissues: Extra-spinal metastatic disease is partially imaged and present on prior cross-sectional imaging. Disc levels: Mild multilevel degenerative disc disease and facet arthropathy. There is no high-grade degenerative stenosis. IMPRESSION: Diffuse osseous metastatic disease. No significant epidural extension. No definite acute compression deformity. Electronically Signed   By: PrMacy Mis.D.   On: 11/18/2019 14:55   CT Abdomen Pelvis W Contrast  Result Date: 11/02/2019 CLINICAL DATA:  Abdominal pain, history of breast cancer EXAM: CT ABDOMEN AND PELVIS WITH CONTRAST  TECHNIQUE: Multidetector CT imaging of the abdomen and pelvis was performed using the standard protocol following bolus administration of intravenous contrast. CONTRAST:  137mL OMNIPAQUE IOHEXOL 300 MG/ML  SOLN COMPARISON:  None. FINDINGS: Lower chest: The visualized heart size  within normal limits. No pericardial fluid/thickening. No hiatal hernia. Multiple pulmonary nodules are seen in the visualized left lung base. The largest posteriorly measures 1 cm in transverse dimension. Hepatobiliary: Extensive heterogeneous hypodense lesions are seen throughout the liver parenchyma. The portal vein is patent. No evidence of calcified gallstones, gallbladder wall thickening or biliary dilatation. Pancreas: Unremarkable. No pancreatic ductal dilatation or surrounding inflammatory changes. Spleen: Normal in size without focal abnormality. Adrenals/Urinary Tract: Both adrenal glands appear normal. There is a punctate calcifications seen in the lower pole of the left kidney. No hydronephrosis is noted. Bladder is unremarkable. Stomach/Bowel: The stomach, small bowel, and colon are normal in appearance. No inflammatory changes, wall thickening, or obstructive findings.The appendix is normal. Vascular/Lymphatic: There are no enlarged mesenteric, retroperitoneal, or pelvic lymph nodes. Scattered aortic atherosclerotic calcifications are seen without aneurysmal dilatation. Reproductive: The uterus and adnexa are unremarkable. Other: No evidence of abdominal wall mass or hernia. Musculoskeletal: Extensive mixed lytic and blastic osseous lesions are seen throughout. Since the prior exam there appears to be a slight anterior wedge compression deformity of the T12 vertebral body with less than 25% loss in vertebral body height. IMPRESSION: Extensive hypodense lesions throughout the liver parenchyma, concerning for hepatic metastases. Pulmonary nodules at the left lung base, concerning for pulmonary metastases. Extensive osseous metastatic lesions. Since the prior exam subacute anterior wedge compression deformity of the T12 vertebral body. Aortic Atherosclerosis (ICD10-I70.0). Electronically Signed   By: Prudencio Pair M.D.   On: 11/02/2019 03:39   [place] CORE BIOPSY (LIVER)  Result Date:  11/19/2019 INDICATION: History of breast carcinoma with new multiple liver lesions, lung nodules and bone lesions consistent with recurrent metastatic disease. Biopsy of a liver lesion has been requested to confirm metastatic disease and perform molecular prognostic studies. EXAM: ULTRASOUND GUIDED CORE BIOPSY OF LIVER MEDICATIONS: None. ANESTHESIA/SEDATION: Fentanyl 100 mcg IV; Versed 2.0 mg IV Moderate Sedation Time:  16 minutes. The patient was continuously monitored during the procedure by the interventional radiology nurse under my direct supervision. PROCEDURE: The procedure, risks, benefits, and alternatives were explained to the patient. Questions regarding the procedure were encouraged and answered. The patient understands and consents to the procedure. A time-out was performed prior to initiating the procedure. The abdominal wall was prepped with chlorhexidine in a sterile fashion, and a sterile drape was applied covering the operative field. A sterile gown and sterile gloves were used for the procedure. Local anesthesia was provided with 1% Lidocaine. Ultrasound was used to localize liver lesions. After choosing a lesion in the right lobe of the liver, a 17 gauge needle was advanced into the liver to the margin of the lesion. Three separate coaxial 18 gauge core biopsy samples were obtained through the lesion. Core biopsy samples were submitted in formalin. Gel-Foam pledgets were advanced through the outer needle as the needle was retracted and removed. Additional ultrasound was performed. COMPLICATIONS: None immediate. FINDINGS: There are multiple hypoechoic solid round and oval masses scattered throughout the liver parenchyma. The largest localized by ultrasound measures approximately 2 cm in diameter and lies within the anterior aspect of the right lobe near the gallbladder fossa. This lesion was sampled yielding solid tissue. IMPRESSION: Ultrasound-guided core biopsy performed of a 2 cm lesion within  the right lobe of  the liver. Electronically Signed   By: Aletta Edouard M.D.   On: 11/19/2019 15:42     ASSESSMENT:  1.  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 -CTAP with contrast on 11/02/2019 done in the ER showed extensive hypodense lesions throughout the liver parenchyma concerning for metastatic disease.  Lung nodules at the left lung base.  Extensive bone metastasis.  Anterior wedge compression deformity of T12 vertebral body. -Liver biopsy on 11/19/2019 shows invasive ductal carcinoma, ER/PR positive, HER-2 positive by FISH, Ki-67-20%. -MRI of the thoracic spine on 11/18/2019 shows diffuse osseous metastatic disease with no significant extradural extension.  MRI of the lumbar spine on 10/24/2019 shows multifocal osseous signal abnormality with moderate disc degeneration at L3-L4 and L4-L5.  No epidural tumor. -CT chest with contrast showed widespread pleuroparenchymal, left hilar, osseous metastatic disease.  2.  History of left breast cancer: -Diagnosed in 2008, ER/PR/HER-2 positive.  Treated in [place], [place]. -Completed 1 year of Herceptin. -Took 5 years of tamoxifen and 5 years of Femara. -Genetic testing was reportedly negative.  3.  Osteopenia: -DEXA scan on 03/27/2019 with T score -1.0. -Started on Prolia from 03/13/2015, last injection on 09/26/2019.   PLAN:  1.  Metastatic HER-2 positive breast cancer to the bones and liver: -Reviewed liver biopsy results from 11/19/2019. -Discussed the prognosis of metastatic HER-2 positive breast cancer. -Because of visceral disease, I have recommended first-line chemotherapy with docetaxel, and HER-2 directed therapy with trastuzumab and Pertuzumab. -We discussed the side effects in detail. -We will obtain baseline echocardiogram.  We will also have a port placed for  chemotherapy. -We will likely start chemotherapy early next week.  2.  Weight loss: -Lost 20 to 30 pounds in the last 1 month.  She was evaluated by Gladstone Pih from dietary.  3.  Back pain: -She is currently on fentanyl 12 mcg patch and takes Percocet 10/325 every 6 hours as needed. -This regimen is not controlling her pain. -We will increase fentanyl to 25 mcg patch. -We will discontinue Percocet and start her on Dilaudid 2 mg every 4 hours as needed.  4.  Malignant hypercalcemia: -Her labs today revealed a calcium of around 16.  Albumin was 3.2.  Her creatinine is also increased to 1.6. -We will treat her with 1 L of normal saline and 4 mg of Medrol. -She will come back tomorrow for 1 L of fluids.   Orders placed this encounter:  Orders Placed This Encounter  Procedures  . CBC with Differential/Platelet  . Comprehensive metabolic panel  . Cancer antigen 27.29  . Cancer antigen 15-3  . Ferritin  . Folate  . Iron and TIBC  . Vitamin B12  . ECHOCARDIOGRAM COMPLETE   Total time spent is 40 minutes with more than 50% of the time spent face-to-face discussing diagnosis, prognosis, treatment plan, counseling and coordination of care.  Derek Jack, MD [place] [phone]   I, Milinda Antis, am acting as a scribe for Dr. Sanda Linger.  I, Derek Jack MD, have reviewed the above documentation for accuracy and completeness, and I agree with the above.

## 2019-11-28 NOTE — Progress Notes (Signed)
Pt will return to clinic tomorrow for repeat CMET and NS 1L over 1 hour for tx of hypercalcemia.

## 2019-11-28 NOTE — Progress Notes (Signed)
Tolerated infusions w/o adverse reaction.  Alert, in no distress.  Recheck HR 120.  Discharged via wheelchair is stable condition in c/o spouse.

## 2019-11-28 NOTE — Patient Instructions (Addendum)
[place] at [facility] Discharge Instructions  You were seen today by Dr. Delton Coombes. He went over your recent results and scans; your breast cancer has metastasized to the liver. You will begin chemotherapy with docetaxel, Herceptin, and Perjeta, which will be given every 3 weeks. Purchase Imodium over the counter and take 2 tablets when you have a watery bowel movement and 1 tablet after every watery bowel movement. You will need an echocardiogram before beginning your chemo. You will also be referred to a general surgeon for port placement. Apply 2 fentanyl patches at the same to ease your pain. You will be prescribed hydromorphone 2 mg every 4 hours for your breathrough pain; stop taking Percocet. Dr. Delton Coombes will see you back in 1 week for labs and initiating treatment.   Thank you for choosing [place] at [facility] to provide your oncology and hematology care.  To afford each patient quality time with our provider, please arrive at least 15 minutes before your scheduled appointment time.   If you have a lab appointment with the [facility] please come in thru the Main Entrance and check in at the main information desk  You need to re-schedule your appointment should you arrive 10 or more minutes late.  We strive to give you quality time with our providers, and arriving late affects you and other patients whose appointments are after yours.  Also, if you no show three or more times for appointments you may be dismissed from the clinic at the providers discretion.     Again, thank you for choosing [facility].  Our hope is that these requests will decrease the amount of time that you wait before being seen by our physicians.       _____________________________________________________________  Should you have questions after your visit to [facility], please contact our office at [phone] between the hours of  8:00 a.m. and 4:30 p.m.  Voicemails left after 4:00 p.m. will not be returned until the following business day.  For prescription refill requests, have your pharmacy contact our office and allow 72 hours.    Cancer Center Support Programs:   > Cancer Support Group  2nd Tuesday of the month 1pm-2pm, Journey Room

## 2019-11-28 NOTE — Progress Notes (Signed)
START ON PATHWAY REGIMEN - Breast     Cycle 1: A cycle is 21 days:     Pertuzumab      Trastuzumab-xxxx      Docetaxel    Cycles 2 through 8: A cycle is every 21 days:     Pertuzumab      Trastuzumab-xxxx      Docetaxel    Cycles 9 and beyond: A cycle is every 21 days:     Pertuzumab      Trastuzumab-xxxx   **Always confirm dose/schedule in your pharmacy ordering system**  Patient Characteristics: Distant Metastases or Locoregional Recurrent Disease - Unresected or Locally Advanced Unresectable Disease Progressing after Neoadjuvant and Local Therapies, HER2 Positive, ER Positive, Chemotherapy + HER2-Targeted Therapy, First Line Therapeutic Status: Distant Metastases ER Status: Positive (+) HER2 Status: Positive (+) PR Status: Positive (+) Line of Therapy: First Line Intent of Therapy: Non-Curative / Palliative Intent, Discussed with Patient 

## 2019-11-28 NOTE — Telephone Encounter (Signed)
Pre-cert Verification for the following procedure    ECHO   DATE: 12/03/2019  LOCATION: [facility]

## 2019-11-29 ENCOUNTER — Encounter (HOSPITAL_COMMUNITY): Payer: Self-pay

## 2019-11-29 ENCOUNTER — Other Ambulatory Visit: Payer: Self-pay

## 2019-11-29 ENCOUNTER — Emergency Department (HOSPITAL_COMMUNITY): Payer: BC Managed Care – PPO

## 2019-11-29 ENCOUNTER — Encounter: Payer: BC Managed Care – PPO | Admitting: Registered Nurse

## 2019-11-29 ENCOUNTER — Inpatient Hospital Stay (HOSPITAL_COMMUNITY): Payer: BC Managed Care – PPO

## 2019-11-29 ENCOUNTER — Inpatient Hospital Stay (HOSPITAL_COMMUNITY)
Admission: EM | Admit: 2019-11-29 | Discharge: 2019-12-06 | DRG: 543 | Disposition: A | Discharge status: Skilled Nursing Facility | Payer: BC Managed Care – PPO | Attending: Internal Medicine | Admitting: Internal Medicine

## 2019-11-29 DIAGNOSIS — M5416 Radiculopathy, lumbar region: Secondary | ICD-10-CM | POA: Diagnosis present

## 2019-11-29 DIAGNOSIS — Z6820 Body mass index (BMI) 20.0-20.9, adult: Secondary | ICD-10-CM

## 2019-11-29 DIAGNOSIS — S12101D Unspecified nondisplaced fracture of second cervical vertebra, subsequent encounter for fracture with routine healing: Secondary | ICD-10-CM | POA: Diagnosis not present

## 2019-11-29 DIAGNOSIS — M8580 Other specified disorders of bone density and structure, unspecified site: Secondary | ICD-10-CM | POA: Diagnosis not present

## 2019-11-29 DIAGNOSIS — C7931 Secondary malignant neoplasm of brain: Secondary | ICD-10-CM | POA: Diagnosis not present

## 2019-11-29 DIAGNOSIS — C7802 Secondary malignant neoplasm of left lung: Secondary | ICD-10-CM | POA: Diagnosis present

## 2019-11-29 DIAGNOSIS — K5903 Drug induced constipation: Secondary | ICD-10-CM | POA: Diagnosis present

## 2019-11-29 DIAGNOSIS — E11649 Type 2 diabetes mellitus with hypoglycemia without coma: Secondary | ICD-10-CM | POA: Diagnosis present

## 2019-11-29 DIAGNOSIS — C7801 Secondary malignant neoplasm of right lung: Secondary | ICD-10-CM | POA: Diagnosis not present

## 2019-11-29 DIAGNOSIS — J984 Other disorders of lung: Secondary | ICD-10-CM | POA: Diagnosis not present

## 2019-11-29 DIAGNOSIS — Z20822 Contact with and (suspected) exposure to covid-19: Secondary | ICD-10-CM | POA: Diagnosis present

## 2019-11-29 DIAGNOSIS — R918 Other nonspecific abnormal finding of lung field: Secondary | ICD-10-CM | POA: Diagnosis not present

## 2019-11-29 DIAGNOSIS — R531 Weakness: Secondary | ICD-10-CM

## 2019-11-29 DIAGNOSIS — E876 Hypokalemia: Secondary | ICD-10-CM | POA: Diagnosis present

## 2019-11-29 DIAGNOSIS — Z515 Encounter for palliative care: Secondary | ICD-10-CM

## 2019-11-29 DIAGNOSIS — E785 Hyperlipidemia, unspecified: Secondary | ICD-10-CM | POA: Diagnosis present

## 2019-11-29 DIAGNOSIS — C7951 Secondary malignant neoplasm of bone: Principal | ICD-10-CM | POA: Diagnosis present

## 2019-11-29 DIAGNOSIS — G894 Chronic pain syndrome: Secondary | ICD-10-CM | POA: Diagnosis present

## 2019-11-29 DIAGNOSIS — R1012 Left upper quadrant pain: Secondary | ICD-10-CM | POA: Diagnosis not present

## 2019-11-29 DIAGNOSIS — C50412 Malignant neoplasm of upper-outer quadrant of left female breast: Secondary | ICD-10-CM | POA: Diagnosis present

## 2019-11-29 DIAGNOSIS — M2550 Pain in unspecified joint: Secondary | ICD-10-CM | POA: Diagnosis not present

## 2019-11-29 DIAGNOSIS — I129 Hypertensive chronic kidney disease with stage 1 through stage 4 chronic kidney disease, or unspecified chronic kidney disease: Secondary | ICD-10-CM | POA: Diagnosis not present

## 2019-11-29 DIAGNOSIS — G620 Drug-induced polyneuropathy: Secondary | ICD-10-CM | POA: Diagnosis not present

## 2019-11-29 DIAGNOSIS — T402X5A Adverse effect of other opioids, initial encounter: Secondary | ICD-10-CM | POA: Diagnosis present

## 2019-11-29 DIAGNOSIS — Z923 Personal history of irradiation: Secondary | ICD-10-CM

## 2019-11-29 DIAGNOSIS — M549 Dorsalgia, unspecified: Secondary | ICD-10-CM | POA: Diagnosis not present

## 2019-11-29 DIAGNOSIS — C787 Secondary malignant neoplasm of liver and intrahepatic bile duct: Secondary | ICD-10-CM | POA: Diagnosis not present

## 2019-11-29 DIAGNOSIS — C50919 Malignant neoplasm of unspecified site of unspecified female breast: Secondary | ICD-10-CM | POA: Diagnosis present

## 2019-11-29 DIAGNOSIS — R9431 Abnormal electrocardiogram [ECG] [EKG]: Secondary | ICD-10-CM | POA: Diagnosis not present

## 2019-11-29 DIAGNOSIS — G893 Neoplasm related pain (acute) (chronic): Secondary | ICD-10-CM

## 2019-11-29 DIAGNOSIS — Z170 Estrogen receptor positive status [ER+]: Secondary | ICD-10-CM | POA: Diagnosis not present

## 2019-11-29 DIAGNOSIS — N182 Chronic kidney disease, stage 2 (mild): Secondary | ICD-10-CM | POA: Diagnosis present

## 2019-11-29 DIAGNOSIS — Z9221 Personal history of antineoplastic chemotherapy: Secondary | ICD-10-CM | POA: Diagnosis not present

## 2019-11-29 DIAGNOSIS — Z853 Personal history of malignant neoplasm of breast: Secondary | ICD-10-CM | POA: Diagnosis not present

## 2019-11-29 DIAGNOSIS — G479 Sleep disorder, unspecified: Secondary | ICD-10-CM | POA: Diagnosis not present

## 2019-11-29 DIAGNOSIS — R0789 Other chest pain: Secondary | ICD-10-CM | POA: Diagnosis not present

## 2019-11-29 DIAGNOSIS — T451X5A Adverse effect of antineoplastic and immunosuppressive drugs, initial encounter: Secondary | ICD-10-CM

## 2019-11-29 DIAGNOSIS — Z79811 Long term (current) use of aromatase inhibitors: Secondary | ICD-10-CM | POA: Diagnosis not present

## 2019-11-29 DIAGNOSIS — R112 Nausea with vomiting, unspecified: Secondary | ICD-10-CM | POA: Diagnosis not present

## 2019-11-29 DIAGNOSIS — N179 Acute kidney failure, unspecified: Secondary | ICD-10-CM | POA: Diagnosis not present

## 2019-11-29 DIAGNOSIS — K5900 Constipation, unspecified: Secondary | ICD-10-CM | POA: Diagnosis not present

## 2019-11-29 DIAGNOSIS — T40601A Poisoning by unspecified narcotics, accidental (unintentional), initial encounter: Secondary | ICD-10-CM | POA: Diagnosis not present

## 2019-11-29 DIAGNOSIS — E1122 Type 2 diabetes mellitus with diabetic chronic kidney disease: Secondary | ICD-10-CM | POA: Diagnosis present

## 2019-11-29 DIAGNOSIS — R000 Tachycardia, unspecified: Secondary | ICD-10-CM | POA: Diagnosis not present

## 2019-11-29 DIAGNOSIS — Z79899 Other long term (current) drug therapy: Secondary | ICD-10-CM | POA: Diagnosis not present

## 2019-11-29 DIAGNOSIS — Z7189 Other specified counseling: Secondary | ICD-10-CM | POA: Diagnosis not present

## 2019-11-29 DIAGNOSIS — G9341 Metabolic encephalopathy: Secondary | ICD-10-CM | POA: Diagnosis not present

## 2019-11-29 LAB — COMPREHENSIVE METABOLIC PANEL
ALT: 15 U/L (ref 0–44)
AST: 49 U/L — ABNORMAL HIGH (ref 15–41)
Albumin: 3.1 g/dL — ABNORMAL LOW (ref 3.5–5.0)
Alkaline Phosphatase: 172 U/L — ABNORMAL HIGH (ref 38–126)
Anion gap: 10 (ref 5–15)
BUN: 27 mg/dL — ABNORMAL HIGH (ref 8–23)
CO2: 26 mmol/L (ref 22–32)
Calcium: >15 mg/dL (ref 8.9–10.3)
Chloride: 98 mmol/L (ref 98–111)
Creatinine, Ser: 1.34 mg/dL — ABNORMAL HIGH (ref 0.44–1.00)
GFR, Estimated: 45 mL/min — ABNORMAL LOW (ref >60)
Glucose, Bld: 120 mg/dL — ABNORMAL HIGH (ref 70–99)
Potassium: 3.3 mmol/L — ABNORMAL LOW (ref 3.5–5.1)
Sodium: 134 mmol/L — ABNORMAL LOW (ref 135–145)
Total Bilirubin: 0.6 mg/dL (ref 0.3–1.2)
Total Protein: 8.6 g/dL — ABNORMAL HIGH (ref 6.5–8.1)

## 2019-11-29 LAB — BASIC METABOLIC PANEL
Anion gap: 8 (ref 5–15)
BUN: 19 mg/dL (ref 8–23)
CO2: 25 mmol/L (ref 22–32)
Calcium: 12.2 mg/dL — ABNORMAL HIGH (ref 8.9–10.3)
Chloride: 103 mmol/L (ref 98–111)
Creatinine, Ser: 1.02 mg/dL — ABNORMAL HIGH (ref 0.44–1.00)
GFR, Estimated: >60 mL/min (ref >60)
Glucose, Bld: 108 mg/dL — ABNORMAL HIGH (ref 70–99)
Potassium: 3 mmol/L — ABNORMAL LOW (ref 3.5–5.1)
Sodium: 136 mmol/L (ref 135–145)

## 2019-11-29 LAB — RESPIRATORY PANEL BY RT PCR (FLU A&B, COVID)
Influenza A by PCR: NEGATIVE
Influenza B by PCR: NEGATIVE
SARS Coronavirus 2 by RT PCR: NEGATIVE

## 2019-11-29 LAB — CBC WITH DIFFERENTIAL/PLATELET
Abs Immature Granulocytes: 0.03 10*3/uL (ref 0.00–0.07)
Basophils Absolute: 0 10*3/uL (ref 0.0–0.1)
Basophils Relative: 0 %
Eosinophils Absolute: 0 10*3/uL (ref 0.0–0.5)
Eosinophils Relative: 0 %
HCT: 33.1 % — ABNORMAL LOW (ref 36.0–46.0)
Hemoglobin: 10 g/dL — ABNORMAL LOW (ref 12.0–15.0)
Immature Granulocytes: 1 %
Lymphocytes Relative: 9 %
Lymphs Abs: 0.6 10*3/uL — ABNORMAL LOW (ref 0.7–4.0)
MCH: 25.8 pg — ABNORMAL LOW (ref 26.0–34.0)
MCHC: 30.2 g/dL (ref 30.0–36.0)
MCV: 85.3 fL (ref 80.0–100.0)
Monocytes Absolute: 0.2 10*3/uL (ref 0.1–1.0)
Monocytes Relative: 4 %
Neutro Abs: 5.5 10*3/uL (ref 1.7–7.7)
Neutrophils Relative %: 86 %
Platelets: 367 10*3/uL (ref 150–400)
RBC: 3.88 MIL/uL (ref 3.87–5.11)
RDW: 19.1 % — ABNORMAL HIGH (ref 11.5–15.5)
WBC: 6.3 10*3/uL (ref 4.0–10.5)
nRBC: 0.8 % — ABNORMAL HIGH (ref 0.0–0.2)

## 2019-11-29 LAB — CANCER ANTIGEN 27.29: CA 27.29: 1779.1 U/mL — ABNORMAL HIGH (ref 0.0–38.6)

## 2019-11-29 LAB — URINALYSIS, ROUTINE W REFLEX MICROSCOPIC
Bilirubin Urine: NEGATIVE
Glucose, UA: NEGATIVE mg/dL
Hgb urine dipstick: NEGATIVE
Ketones, ur: NEGATIVE mg/dL
Leukocytes,Ua: NEGATIVE
Nitrite: NEGATIVE
Protein, ur: NEGATIVE mg/dL
Specific Gravity, Urine: 1.008 (ref 1.005–1.030)
pH: 7 (ref 5.0–8.0)

## 2019-11-29 LAB — LACTIC ACID, PLASMA
Lactic Acid, Venous: 1.6 mmol/L (ref 0.5–1.9)
Lactic Acid, Venous: 1.6 mmol/L (ref 0.5–1.9)

## 2019-11-29 LAB — CANCER ANTIGEN 15-3: CA 15-3: 1736 U/mL — ABNORMAL HIGH (ref 0.0–25.0)

## 2019-11-29 LAB — CBG MONITORING, ED: Glucose-Capillary: 82 mg/dL (ref 70–99)

## 2019-11-29 LAB — APTT: aPTT: 31 seconds (ref 24–36)

## 2019-11-29 LAB — PROTIME-INR
INR: 1.1 (ref 0.8–1.2)
Prothrombin Time: 14 seconds (ref 11.4–15.2)

## 2019-11-29 MED ORDER — POTASSIUM CHLORIDE 10 MEQ/100ML IV SOLN
10.0000 meq | INTRAVENOUS | Status: AC
  Administered 2019-11-29 (×2): 10 meq via INTRAVENOUS
  Filled 2019-11-29 (×2): qty 100

## 2019-11-29 MED ORDER — POTASSIUM CHLORIDE CRYS ER 20 MEQ PO TBCR
40.0000 meq | EXTENDED_RELEASE_TABLET | ORAL | Status: AC
  Administered 2019-11-29 – 2019-11-30 (×2): 40 meq via ORAL
  Filled 2019-11-29 (×2): qty 2

## 2019-11-29 MED ORDER — GADOBUTROL 1 MMOL/ML IV SOLN
5.0000 mL | Freq: Once | INTRAVENOUS | Status: AC | PRN
  Administered 2019-11-29: 5 mL via INTRAVENOUS

## 2019-11-29 MED ORDER — SODIUM CHLORIDE 0.9 % IV SOLN: INTRAVENOUS | Status: AC

## 2019-11-29 MED ORDER — CALCITONIN (SALMON) 200 UNIT/ML IJ SOLN
200.0000 [IU] | Freq: Once | INTRAMUSCULAR | Status: AC
  Administered 2019-11-29: 200 [IU] via SUBCUTANEOUS
  Filled 2019-11-29: qty 1

## 2019-11-29 MED ORDER — DEXAMETHASONE SODIUM PHOSPHATE 4 MG/ML IJ SOLN
4.0000 mg | Freq: Four times a day (QID) | INTRAMUSCULAR | Status: DC
  Filled 2019-11-29: qty 1

## 2019-11-29 MED ORDER — INSULIN ASPART 100 UNIT/ML ~~LOC~~ SOLN
0.0000 [IU] | Freq: Three times a day (TID) | SUBCUTANEOUS | Status: DC
  Administered 2019-12-03: 1 [IU] via SUBCUTANEOUS
  Administered 2019-12-03: 3 [IU] via SUBCUTANEOUS
  Administered 2019-12-04: 2 [IU] via SUBCUTANEOUS

## 2019-11-29 MED ORDER — CALCITONIN (SALMON) 200 UNIT/ML IJ SOLN
4.0000 [IU]/kg | Freq: Two times a day (BID) | INTRAMUSCULAR | Status: DC
  Administered 2019-11-29: 198 [IU] via SUBCUTANEOUS
  Filled 2019-11-29: qty 2
  Filled 2019-11-29 (×4): qty 0.99

## 2019-11-29 MED ORDER — SODIUM CHLORIDE 0.9 % IV SOLN: Freq: Once | INTRAVENOUS | Status: AC

## 2019-11-29 MED ORDER — HYDROMORPHONE HCL 1 MG/ML IJ SOLN
1.0000 mg | Freq: Once | INTRAMUSCULAR | Status: AC
  Administered 2019-11-29: 1 mg via INTRAVENOUS
  Filled 2019-11-29: qty 1

## 2019-11-29 MED ORDER — HYDRALAZINE HCL 20 MG/ML IJ SOLN: 5.0000 mg | Freq: Four times a day (QID) | INTRAMUSCULAR | Status: DC | PRN

## 2019-11-29 MED ORDER — SODIUM CHLORIDE 0.9 % IV BOLUS
1000.0000 mL | Freq: Once | INTRAVENOUS | Status: AC
  Administered 2019-11-29: 1000 mL via INTRAVENOUS

## 2019-11-29 MED ORDER — INSULIN ASPART 100 UNIT/ML ~~LOC~~ SOLN: 0.0000 [IU] | Freq: Every day | SUBCUTANEOUS | Status: DC

## 2019-11-29 MED ORDER — SODIUM CHLORIDE 0.9 % IV SOLN: INTRAVENOUS | Status: DC

## 2019-11-29 MED ORDER — HYDROMORPHONE HCL 2 MG PO TABS
2.0000 mg | ORAL_TABLET | ORAL | Status: DC | PRN
  Administered 2019-11-29 – 2019-11-30 (×2): 2 mg via ORAL
  Filled 2019-11-29 (×2): qty 1

## 2019-11-29 NOTE — ED Provider Notes (Signed)
[facility] EMERGENCY DEPARTMENT Provider Note   CSN: [id number] Arrival date & time: 11/29/19  1333  LEVEL 5 CAVEAT - ALTERED MENTAL STATUS   History Chief Complaint  Patient presents with  . Abnormal Lab    Krista Doyle is a 59 y.o. female.  HPI 59 year old female presents from the cancer center with hypercalcemia. History is primarily from family at bedside. Recently diagnosed with cancer. Got labs yesterday, and came back today for fluids and repeat labs. However, husband has noticed altered mental status/confusion for about 1 day. Seems to be generally confused. Is also diffusely weak and has had a headache for 3 weeks. Chronic nausea, no vomiting. Low grade temp of 100.2 recently. No urinary symptoms.    Past Medical History:  Diagnosis Date  . Asthma   . Back pain   . Breast cancer ([place]) 2008   left  . Diabetes ([place])   . Hypercholesterolemia   . Hypertension   . 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 Personal history of chemotherapy   . Personal history of radiation therapy 2008    Patient Active Problem List   Diagnosis Date Noted  . Goals of care, counseling/discussion 11/28/2019  . Metastatic breast cancer ([place]) 11/28/2019  . Lumbar facet arthropathy 10/07/2015  . 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 Constipation due to opioid therapy 05/08/2015  . Genetic testing 03/17/2015  . Osteopenia determined by x-ray 03/13/2015  . Lumbar spondylosis 02/11/2015  . Lumbar radiculopathy 02/11/2015  . Chemotherapy-induced peripheral neuropathy ([place]) 02/11/2015  . Vitamin D deficiency 02/02/2015  . Breast cancer of upper-outer quadrant of left female breast ([place]) 12/22/2014  . High risk medication use 12/22/2014    Past Surgical History:  Procedure Laterality Date  . BACK SURGERY    . BREAST LUMPECTOMY    . COLONOSCOPY     about 3 yrs ago in [place]  . KNEE SURGERY     right  . TONSILLECTOMY       OB History   No obstetric history on file.      Family History  Problem Relation Age of Onset  . Colon cancer Neg Hx     Social History   Tobacco Use  . Smoking status: Never Smoker  . Smokeless tobacco: Never Used  Substance Use Topics  . Alcohol use: No    Alcohol/week: 0.0 standard drinks  . Drug use: No    Home Medications Prior to Admission medications   Medication Sig Start Date End Date Taking? Authorizing Provider  albuterol (PROVENTIL HFA;VENTOLIN HFA) 108 (90 Base) MCG/ACT inhaler Inhale 1 puff into the lungs every 6 (six) hours as needed for wheezing or shortness of breath.    [provider]  amitriptyline (ELAVIL) 10 MG tablet 1-2 tablets at bedtime every day Patient taking differently: Take 10-20 mg by mouth at bedtime.  04/08/19   Bayard Hugger, NP  atorvastatin (LIPITOR) 20 MG tablet Take 20 mg by mouth daily.    [provider]  BD PEN NEEDLE NANO 2ND GEN 32G X 4 MM MISC SMARTSIG:Injection Daily 09/19/19   [provider]  Blood Glucose Monitoring Suppl (ONETOUCH VERIO REFLECT) w/Device KIT USE TO TEST BLOOD SUGAR 2 TIMES A DAY 01/19/19   [provider]  budesonide-formoterol (SYMBICORT) 160-4.5 MCG/ACT inhaler Inhale 2 puffs into the lungs 2 (two) times daily as needed (shortness of breath).     [provider]  calcium carbonate (OS-CAL - DOSED IN MG OF  ELEMENTAL CALCIUM) 1250 (500 Ca) MG tablet Take 1,250 mg by mouth daily with breakfast.     Penland, Kelby Fam, MD  ergocalciferol (VITAMIN D2) 1.25 MG (50000 UT) capsule Take 1 capsule (50,000 Units total) by mouth once a week. Patient taking differently: Take 50,000 Units by mouth every Sunday.  03/28/19   Lockamy, Randi L, NP-C  fentaNYL (DURAGESIC) 25 MCG/HR Place 1 patch onto the skin every 3 (three) days. 11/28/19   Derek Jack, MD  HYDROmorphone (DILAUDID) 2 MG tablet Take 1 tablet (2 mg total) by mouth every 4 (four) hours as needed for severe pain. 11/28/19   Derek Jack, MD  Lactulose 20  GM/30ML SOLN Please take 30 ml every 3 hours until you produce a bowel movement. Then take 30 ml at bedtime daily for constipation. 11/22/19   Derek Jack, MD  Lancets (ONETOUCH DELICA PLUS [id number]) MISC USE TO TEST BLOOD SUGAR 2 TIMES A DAY 01/19/19   [provider]  LANTUS SOLOSTAR 100 UNIT/ML Solostar Pen Inject 24 Units into the skin at bedtime.  08/25/19   [provider]  letrozole (FEMARA) 2.5 MG tablet Take 1 tablet (2.5 mg total) by mouth daily. 09/25/18   Lockamy, Randi L, NP-C  loratadine (CLARITIN) 10 MG tablet Take 10 mg by mouth daily as needed for allergies.    [provider]  losartan (COZAAR) 100 MG tablet Take 100 mg daily by mouth.    [provider]  meclizine (ANTIVERT) 25 MG tablet Take 25 mg by mouth 2 (two) times daily as needed for dizziness.  03/14/19   [provider]  megestrol (MEGACE) 20 MG tablet Take 20 mg by mouth 2 (two) times daily.    [provider]  methocarbamol (ROBAXIN) 500 MG tablet TAKE 1 TABLET BY MOUTH EVERY 6 HOURS AS NEEDED FOR MUSCLE SPASMS. Patient taking differently: Take 500 mg by mouth every 6 (six) hours as needed for muscle spasms.  06/04/19   Bayard Hugger, NP  Multiple Vitamins-Minerals (PRESERVISION AREDS 2) CAPS Take 1 capsule by mouth daily.    [provider]  naloxegol oxalate (MOVANTIK) 25 MG TABS tablet Take 1 tablet (25 mg total) by mouth daily. 01/13/17   Bayard Hugger, NP  ondansetron (ZOFRAN) 4 MG tablet Take 4 mg by mouth 2 (two) times daily.  10/29/19   [provider]  ONETOUCH VERIO test strip USE TO TEST BLOOD SUGAR 2 TIMES A DAY 01/19/19   [provider]  oxyCODONE-acetaminophen (PERCOCET) 10-325 MG tablet Take 1 tablet by mouth every 6 (six) hours as needed for pain. 10/31/19   Bayard Hugger, NP  pregabalin (LYRICA) 100 MG capsule Take 1 capsule (100 mg total) by mouth 2 (two) times daily. 10/31/19   Bayard Hugger, NP  senna  (SENOKOT) 8.6 MG TABS tablet Take 8.6 mg by mouth daily.     [provider]    Allergies    Other and Pollen extract  Review of Systems   Review of Systems  Unable to perform ROS: Mental status change    Physical Exam Updated Vital Signs BP (!) 146/89   Pulse (!) 127   Temp 98.2 F (36.8 C) (Oral)   Resp (!) 23   Wt 49.4 kg   SpO2 99%   BMI 19.29 kg/m   Physical Exam Vitals and nursing note reviewed.  Constitutional:      Appearance: She is well-developed. She is not diaphoretic.  HENT:  Head: Normocephalic and atraumatic.     Right Ear: External ear normal.     Left Ear: External ear normal.     Nose: Nose normal.  Eyes:     General:        Right eye: No discharge.        Left eye: No discharge.     Pupils: Pupils are equal, round, and reactive to light.  Cardiovascular:     Rate and Rhythm: Regular rhythm. Tachycardia present.     Heart sounds: Normal heart sounds.  Pulmonary:     Effort: Pulmonary effort is normal.     Breath sounds: Examination of the right-lower field reveals rales. Rales present.  Abdominal:     Palpations: Abdomen is soft.     Tenderness: There is no abdominal tenderness. There is no right CVA tenderness or left CVA tenderness.  Skin:    General: Skin is warm and dry.  Neurological:     Mental Status: She is alert. She is disoriented.     Comments: Awake, alert, oriented to person and place, disoriented to month (and agitated when I try to correct her), did not get year. Mild bilateral weakness in upper extremities.  She can not to lift her legs off the stretcher  Psychiatric:        Mood and Affect: Mood is not anxious.     ED Results / Procedures / Treatments   Labs (all labs ordered are listed, but only abnormal results are displayed) Labs Reviewed  CBC WITH DIFFERENTIAL/PLATELET - Abnormal; Notable for the following components:      Result Value   Hemoglobin 10.0 (*)    HCT 33.1 (*)    MCH 25.8 (*)    RDW 19.1  (*)    nRBC 0.8 (*)    Lymphs Abs 0.6 (*)    All other components within normal limits  RESPIRATORY PANEL BY RT PCR (FLU A&B, COVID)  CULTURE, BLOOD (SINGLE)  URINE CULTURE  LACTIC ACID, PLASMA  LACTIC ACID, PLASMA  PROTIME-INR  APTT  URINALYSIS, ROUTINE W REFLEX MICROSCOPIC    EKG EKG Interpretation  Date/Time:  Friday November 29 2019 13:47:38 EDT Ventricular Rate:  123 PR Interval:    QRS Duration: 75 QT Interval:  289 QTC Calculation: 414 R Axis:   3 Text Interpretation: Sinus tachycardia Low voltage, precordial leads Abnormal R-wave progression, early transition LVH by voltage No old tracing to compare Confirmed by Sherwood Gambler [phone]) on 11/29/2019 2:24:31 PM   Radiology DG Chest Port 1 View  Result Date: 11/29/2019 CLINICAL DATA:  Possible sepsis, metastatic breast cancer EXAM: PORTABLE CHEST 1 VIEW COMPARISON:  05/07/2019 FINDINGS: Increased interstitial prominence with patchy density. No pleural effusion or pneumothorax. Cardiomediastinal contours are within normal limits with normal heart size. IMPRESSION: Interstitial prominence and patchy density, at least a component of which likely reflects metastatic disease seen on recent chest CT. There may be superimposed edema or atypical infection. Electronically Signed   By: Macy Mis M.D.   On: 11/29/2019 14:45    Procedures Procedures (including critical care time)  Medications Ordered in ED Medications  0.9 %  sodium chloride infusion (has no administration in time range)  sodium chloride 0.9 % bolus 1,000 mL (1,000 mLs Intravenous New Bag/Given 11/29/19 1424)    ED Course  I have reviewed the triage vital signs and the nursing notes.  Pertinent labs & imaging results that were available during my care of the patient were reviewed by me and  considered in my medical decision making (see chart for details).    MDM Rules/Calculators/A&P                          I think the patient's vague altered mental  status is from hypercalcemia.  She is not febrile here though we will check a rectal temp.  Otherwise, she has been started on IV fluid bolus and high rate.  If her rectal temp is elevated I would give antibiotics for potential pneumonia but seems less likely to be infectious with normal lactate and normal WBC.  Given the change in mental status with metastatic cancer a CT head has been ordered but not yet performed. Care to Dr. Vanita Panda. Final Clinical Impression(s) / ED Diagnoses Final diagnoses:  Hypercalcemia    Rx / DC Orders ED Discharge Orders    None       Sherwood Gambler, MD 11/29/19 1528

## 2019-11-29 NOTE — Progress Notes (Signed)
Patient presents today for 1 L of NS over an 1 hour. Repeat CMP. Heart rate on arrival 122. Patient complains of pain in her lower back which she is unable to rate.   1 L of NS given today per MD orders. Tolerated infusion without adverse affects. Vital signs stable. Calcitonin 200 units given [place]. Husband at the bedside. Husband states, " She is not her normal self today." Calcium > than 15.0 . Patient sent to the ER by Verbal Order per [facility] RN. Patient confused.   Report called to ER charge RN by Janan Ridge RN.  Patient sent to the ER by stretcher and accompanied by 2 CNA's. Vital signs stable. Patient alert on discharge.

## 2019-11-29 NOTE — Progress Notes (Signed)
CRITICAL VALUE ALERT  Critical Value:  Ca++ 16.6  Date & Time Notied:  11/28/2019 at 58  Provider Notified: Dr. Delton Coombes  Orders Received/Actions taken: Give NS 1L over 1 hour and Zometa 4mg  IV

## 2019-11-29 NOTE — ED Provider Notes (Signed)
Care of the patient assumed at signout.  On my initial evaluation of the patient's CT scan of her head was available, the patient was awake and alert, accompanied by her husband.  We discussed CT findings concerning for possible metastatic lesions. Subsequently discussed her case with her oncologist given the patient's presentation with confusion, hypercalcemia.  9 he notes that the patient had received calcitonin, fluids yesterday, similar interventions today prior to being sent here for the confusion.  MRI results available, consistent with metastatic disease in the brain, as well as frontalis muscle, and additional bony mets. Patient and husband aware of all findings.  She is starting Decadron, 4 mg, every 6 hours. Patient will require admission for further monitoring management of her brain metastases and hypercalcemia.   Carmin Muskrat, MD 11/29/19 1943

## 2019-11-29 NOTE — ED Notes (Signed)
ED TO INPATIENT HANDOFF REPORT  ED Nurse Name and Phone #:  [phone]  S Name/Age/Gender Krista Doyle 59 y.o. female Room/Bed: APA14/APA14  Code Status   Code Status: Not on file  Home/SNF/Other Home Patient oriented to: self, place, time and situation Is this baseline? Yes   Triage Complete: Triage complete  Chief Complaint Hypercalcemia [E83.52]  Triage Note Pt sent over from cancer center. Yesterday, pt had a calcium level of over 16. Was given fluids and procalcitonin. Today, calcium level above 15. History of breast cancer that has metastasized to the bone.    Allergies Allergies  Allergen Reactions  . Other     Cat/dog dander  . Pollen Extract     Level of Care/Admitting Diagnosis ED Disposition    ED Disposition Condition [place]: [facility] [[id number]]  Level of Care: ICU [6]  Covid Evaluation: Asymptomatic Screening Protocol (No Symptoms)  Diagnosis: Hypercalcemia [275.42.ICD-9-CM]  Admitting Physician: Manfred Shirts  Attending Physician: Waldron Labs, DAWOOD S [[id number]]  Estimated length of stay: past midnight tomorrow  Certification:: I certify this patient will need inpatient services for at least 2 midnights       B Medical/Surgery History Past Medical History:  Diagnosis Date  . Asthma   . Back pain   . Breast cancer ([place]) 2008   left  . Diabetes ([place])   . Hypercholesterolemia   . Hypertension   . 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 Personal history of chemotherapy   . Personal history of radiation therapy 2008   Past Surgical History:  Procedure Laterality Date  . BACK SURGERY    . BREAST LUMPECTOMY    . COLONOSCOPY     about 3 yrs ago in [place]  . KNEE SURGERY     right  . TONSILLECTOMY       A IV Location/Drains/Wounds Patient Lines/Drains/Airways Status    Active Line/Drains/Airways    Name Placement date Placement time Site Days   Peripheral IV 11/29/19  Distal;Posterior;Right Forearm 11/29/19  1421  Forearm  less than 1   Wound / Incision (Open or Dehisced) 11/19/19 Puncture Abdomen Right 11/19/19  1450  Abdomen  10          Intake/Output Last 24 hours No intake or output data in the 24 hours ending 11/29/19 2035  Labs/Imaging Results for orders placed or performed during the hospital encounter of 11/29/19 (from the past 48 hour(s))  Respiratory Panel by RT PCR (Flu A&B, Covid) - Nasopharyngeal Swab     Status: None   Collection Time: 11/29/19  2:05 PM   Specimen: Nasopharyngeal Swab  Result Value Ref Range   SARS Coronavirus 2 by RT PCR NEGATIVE NEGATIVE    Comment: (NOTE) SARS-CoV-2 target nucleic acids are NOT DETECTED.  The SARS-CoV-2 RNA is generally detectable in upper respiratoy specimens during the acute phase of infection. The lowest concentration of SARS-CoV-2 viral copies this assay can detect is 131 copies/mL. A negative result does not preclude SARS-Cov-2 infection and should not be used as the sole basis for treatment or other patient management decisions. A negative result may occur with  improper specimen collection/handling, submission of specimen other than nasopharyngeal swab, presence of viral mutation(s) within the areas targeted by this assay, and inadequate number of viral copies (<131 copies/mL). A negative result must be combined with clinical observations, patient history, and epidemiological information. The expected result is Negative.  Fact Sheet for Patients:  PinkCheek.be  Fact Sheet for Healthcare Providers:  GravelBags.it  This test is no t yet approved or cleared by the [place] FDA and  has been authorized for detection and/or diagnosis of SARS-CoV-2 by FDA under an Emergency Use Authorization (EUA). This EUA will remain  in effect (meaning this test can be used) for the duration of the COVID-19 declaration under Section  564(b)(1) of the Act, 21 U.S.C. section 360bbb-3(b)(1), unless the authorization is terminated or revoked sooner.     Influenza A by PCR NEGATIVE NEGATIVE   Influenza B by PCR NEGATIVE NEGATIVE    Comment: (NOTE) The Xpert Xpress SARS-CoV-2/FLU/RSV assay is intended as an aid in  the diagnosis of influenza from Nasopharyngeal swab specimens and  should not be used as a sole basis for treatment. Nasal washings and  aspirates are unacceptable for Xpert Xpress SARS-CoV-2/FLU/RSV  testing.  Fact Sheet for Patients: PinkCheek.be  Fact Sheet for Healthcare Providers: GravelBags.it  This test is not yet approved or cleared by the [place] FDA and  has been authorized for detection and/or diagnosis of SARS-CoV-2 by  FDA under an Emergency Use Authorization (EUA). This EUA will remain  in effect (meaning this test can be used) for the duration of the  Covid-19 declaration under Section 564(b)(1) of the Act, 21  U.S.C. section 360bbb-3(b)(1), unless the authorization is  terminated or revoked. Performed at [facility], [address] [place], [place] [address]   CBC with Differential     Status: Abnormal   Collection Time: 11/29/19  2:37 PM  Result Value Ref Range   WBC 6.3 4.0 - 10.5 K/uL   RBC 3.88 3.87 - 5.11 MIL/uL   Hemoglobin 10.0 (L) 12.0 - 15.0 g/dL   HCT 33.1 (L) 36 - 46 %   MCV 85.3 80.0 - 100.0 fL   MCH 25.8 (L) 26.0 - 34.0 pg   MCHC 30.2 30.0 - 36.0 g/dL   RDW 19.1 (H) 11.5 - 15.5 %   Platelets 367 150 - 400 K/uL   nRBC 0.8 (H) 0.0 - 0.2 %   Neutrophils Relative % 86 %   Neutro Abs 5.5 1.7 - 7.7 K/uL   Lymphocytes Relative 9 %   Lymphs Abs 0.6 (L) 0.7 - 4.0 K/uL   Monocytes Relative 4 %   Monocytes Absolute 0.2 0.1 - 1.0 K/uL   Eosinophils Relative 0 %   Eosinophils Absolute 0.0 0.0 - 0.5 K/uL   Basophils Relative 0 %   Basophils Absolute 0.0 0.0 - 0.1 K/uL   Immature Granulocytes 1 %   Abs Immature  Granulocytes 0.03 0.00 - 0.07 K/uL    Comment: Performed at [facility], [address], [place], [place] [address]  Lactic acid, plasma     Status: None   Collection Time: 11/29/19  2:37 PM  Result Value Ref Range   Lactic Acid, Venous 1.6 0.5 - 1.9 mmol/L    Comment: Performed at [facility], [address] [place], [place] [address]  Protime-INR     Status: None   Collection Time: 11/29/19  2:37 PM  Result Value Ref Range   Prothrombin Time 14.0 11.4 - 15.2 seconds   INR 1.1 0.8 - 1.2    Comment: (NOTE) INR goal varies based on device and disease states. Performed at [facility], [address] [place],  [address]   APTT     Status: None   Collection Time: 11/29/19  2:37 PM  Result Value Ref Range  aPTT 31 24 - 36 seconds    Comment: Performed at [facility], [address] [place], [place] [address]  Urinalysis, Routine w reflex microscopic Urine, Catheterized     Status: Abnormal   Collection Time: 11/29/19  3:50 PM  Result Value Ref Range   Color, Urine STRAW (A) YELLOW   APPearance CLEAR CLEAR   Specific Gravity, Urine 1.008 1.005 - 1.030   pH 7.0 5.0 - 8.0   Glucose, UA NEGATIVE NEGATIVE mg/dL   Hgb urine dipstick NEGATIVE NEGATIVE   Bilirubin Urine NEGATIVE NEGATIVE   Ketones, ur NEGATIVE NEGATIVE mg/dL   Protein, ur NEGATIVE NEGATIVE mg/dL   Nitrite NEGATIVE NEGATIVE   Leukocytes,Ua NEGATIVE NEGATIVE    Comment: Performed at [facility], [address] [place], [place] [address]  Lactic acid, plasma     Status: None   Collection Time: 11/29/19  4:29 PM  Result Value Ref Range   Lactic Acid, Venous 1.6 0.5 - 1.9 mmol/L    Comment: Performed at [facility], [address] [place], [place] [address]   CT Head Wo Contrast  Result Date: 11/29/2019 CLINICAL DATA:  Delirium EXAM: CT HEAD WITHOUT CONTRAST TECHNIQUE: Contiguous axial images were obtained from the base of the skull through the vertex without intravenous contrast. COMPARISON:  None. FINDINGS:  Brain: No acute infarct or intracranial hemorrhage. No mass lesion. No midline shift, ventriculomegaly or extra-axial fluid collection. Vascular: No hyperdense vessel or unexpected calcification. Carotid siphon atherosclerotic calcifications. Skull: Mottled, heterogenous appearance of the bifrontal, right parietal calvarium and clivus. There is a 1.6 cm right calvarial soft tissue focus with dehiscence of the bone (2:18). Sinuses/Orbits: Normal orbits. Clear paranasal sinuses. No mastoid effusion. Other: Asymmetric prominence of the right temporalis muscle (2:12). IMPRESSION: No acute infarct or intracranial hemorrhage. Right frontal calvarial soft tissue, asymmetric prominence of the right temporalis muscle and calvarial/clivus heterogeneity is concerning for metastases. MRI head with and without contrast is recommended for further evaluation. Electronically Signed   By: Primitivo Gauze M.D.   On: 11/29/2019 16:37   MR Brain W and Wo Contrast  Result Date: 11/29/2019 CLINICAL DATA:  Dizziness, nonspecific.  Brain mass or lesion. EXAM: MRI HEAD WITHOUT AND WITH CONTRAST TECHNIQUE: Multiplanar, multiecho pulse sequences of the brain and surrounding structures were obtained without and with intravenous contrast. CONTRAST:  33mL GADAVIST GADOBUTROL 1 MMOL/ML IV SOLN COMPARISON:  11/29/2019 head CT. FINDINGS: Brain: No acute infarct. No acute intracranial hemorrhage. No midline shift, ventriculomegaly or extra-axial fluid collection. Diffuse dural thickening overlying the right cerebral convexity. Focal dural thickening overlying the left parietal lobe (18:117). 1.5 x 0.5 cm right dural-based lesion overlying the temporal convexity (18:67). There is also dural thickening along the clivus. Vascular: Preserved major intracranial flow voids. Skull and upper cervical spine: Multifocal T2 hyperintense enhancing calvarial lesions reflect osseous metastases. Enhancing 1.8 cm right frontal calvarial soft tissue (15:35)  with dehiscence of the overlying cortex. 2.6 x 1.8 cm clival metastasis (15:10). Smaller enhancing osseous lesions are also seen involving the bilateral sphenoid wings and occipital condyles. Sinuses/Orbits: Normal orbits clear paranasal sinuses. Trace right mastoid effusion. Other: 1.4 x 0.8 cm enhancing soft tissue involving the left TMJ (18:40). Enhancing soft tissue infiltration involving the right temporalis muscle which is asymmetrically thickened (18:94). IMPRESSION: Dural-based metastatic disease demonstrating right predominance. Focal plaque-like dural lesions measuring up to 1.5 x 0.5 cm overlie the right temporal and left parietal convexities. Multifocal osseous metastases with dominant lesions involving the clivus, right frontal calvarium and  left TMJ. Metastatic involvement of the right temporalis muscle. Electronically Signed   By: Primitivo Gauze M.D.   On: 11/29/2019 19:25   DG Chest Port 1 View  Result Date: 11/29/2019 CLINICAL DATA:  Possible sepsis, metastatic breast cancer EXAM: PORTABLE CHEST 1 VIEW COMPARISON:  05/07/2019 FINDINGS: Increased interstitial prominence with patchy density. No pleural effusion or pneumothorax. Cardiomediastinal contours are within normal limits with normal heart size. IMPRESSION: Interstitial prominence and patchy density, at least a component of which likely reflects metastatic disease seen on recent chest CT. There may be superimposed edema or atypical infection. Electronically Signed   By: Macy Mis M.D.   On: 11/29/2019 14:45    Pending Labs Unresulted Labs (From admission, onward)          Start     Ordered   11/29/19 2035  Hemoglobin A1c  Add-on,   AD       Comments: To assess prior glycemic control    11/29/19 2034   11/29/19 2026  PTH, intact and calcium  Once,   STAT        11/29/19 2025   11/29/19 2026  PTH-related peptide  Once,   STAT        11/29/19 2025   11/29/19 8588  Basic metabolic panel  Now then every 4 hours,   R  (with STAT occurrences)      11/29/19 2024   11/29/19 1423  Blood culture (routine single)  (Undifferentiated presentation (screening labs and basic nursing orders))  ONCE - STAT,   STAT        11/29/19 1422   11/29/19 1423  Urine culture  (Undifferentiated presentation (screening labs and basic nursing orders))  ONCE - STAT,   STAT        11/29/19 1422   Signed and Held  HIV Antibody (routine testing w rflx)  (HIV Antibody (Routine testing w reflex) panel)  Tomorrow morning,   R        Signed and Held   Signed and Held  Comprehensive metabolic panel  Tomorrow morning,   R        Signed and Held   Signed and Held  Protime-INR  Tomorrow morning,   R        Signed and Held   Signed and Held  CBC  Tomorrow morning,   R        Signed and Held          Vitals/Pain Today's Vitals   11/29/19 1600 11/29/19 1630 11/29/19 1757 11/29/19 1800  BP: (!) 160/94 (!) 159/89  (!) 167/96  Pulse: (!) 121 (!) 120  (!) 122  Resp: (!) 21 17  (!) 21  Temp:      TempSrc:      SpO2:  99%  100%  Weight:      PainSc:   8      Isolation Precautions No active isolations  Medications Medications  0.9 %  sodium chloride infusion ( Intravenous New Bag/Given 11/29/19 1745)  dexamethasone (DECADRON) injection 4 mg (has no administration in time range)  calcitonin (MIACALCIN) injection 198 Units (has no administration in time range)  insulin aspart (novoLOG) injection 0-9 Units (has no administration in time range)  insulin aspart (novoLOG) injection 0-5 Units (has no administration in time range)  sodium chloride 0.9 % bolus 1,000 mL (0 mLs Intravenous Stopped 11/29/19 1653)  HYDROmorphone (DILAUDID) injection 1 mg (1 mg Intravenous Given 11/29/19 1800)  gadobutrol (GADAVIST) 1 MMOL/ML injection 5  mL (5 mLs Intravenous Contrast Given 11/29/19 1821)    Mobility walks     Focused Assessments    R Recommendations: See Admitting Provider Note  Report given to:   Additional Notes:

## 2019-11-29 NOTE — ED Notes (Signed)
Pt to CT

## 2019-11-29 NOTE — ED Notes (Signed)
Pt brought back from triage in the wheelchair; pt rapidly breathing, pt encouraged to slow her breathing and pt sped up he breathing; pt asked to get on bed and pt assisted by 2 nurses but pt did not attempt to get out of wheelchair;  Pt wheeled beside the bed and given call bell

## 2019-11-29 NOTE — ED Triage Notes (Signed)
Pt sent over from cancer center. Yesterday, pt had a calcium level of over 16. Was given fluids and procalcitonin. Today, calcium level above 15. History of breast cancer that has metastasized to the bone.

## 2019-11-29 NOTE — ED Notes (Signed)
ED Provider at bedside. 

## 2019-11-29 NOTE — ED Notes (Signed)
Report to Jessica, RN

## 2019-11-29 NOTE — H&P (Signed)
TRH H&P   Patient Demographics:    Krista Doyle, is a 59 y.o. female  MRN: [id number]   DOB - 13-Feb-1960  Admit Date - 11/29/2019  Outpatient Primary MD for the patient is [name], MD  Referring MD/NP/PA: Dr Vanita Panda  Outpatient Specialists: Oncology   Dr Jamey Reas  Patient coming from: oncology office.  Chief Complaint  Patient presents with  . Abnormal Lab      HPI:    Krista Doyle  is a 59 y.o. female, with past medical history of hypertension, hyperlipidemia, chemotherapy-induced neuropathy, diabetes mellitus, history of breast cancer visually diagnosed in 2008, status post radiation therapy and chemotherapy,  with recent diagnosis of metastatic disease to liver and osseous metastasis, patient was sent from oncology office for hypercalcemia, patient was recently diagnosed with lumbar/thoracic spine osseous metastasis, liver metastasis as well, patient work-up was significant for hypercalcemia at oncology office yesterday, where she received IV fluids and IV Zometa, today as well she received IV fluids and calcitonin in oncology office and sent to ED for further management, husband reports patient has more altered and confused over last 24 hours, as well has been weaker than her baseline, she has been reporting headache for 3 weeks, and has poor oral appetite, and oral intake, she has chronic nausea, but no vomiting. - in ED her calcium came<15, elevated at 1.3, baseline 0.8, potassium low at 3.3, MRI brain significant for 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  Review of systems:    In addition to the HPI above,  No Fever-chills, generalized weakness, fatigue, poor appetite  Does report some headache, no no changes with Vision or hearing, No problems swallowing food or  Liquids, No Chest pain, Cough or Shortness of Breath, No Abdominal pain, No Nausea or Vommitting, has constipation No Blood in stool or Urine, No dysuria, No new skin rashes or bruises, Significant back pain No new weakness, ports peripheral neuropathy Ports weight loss No polyuria, polydypsia or polyphagia, reports poor appetite No significant Mental Stressors.  She has confusion by her husband  A full 10 point Review of Systems was done, except as stated above, all other Review of Systems were negative.   With Past History of the following :    Past Medical History:  Diagnosis Date  . Asthma   . Back pain   . Breast cancer ([place]) 2008   left  . Diabetes ([place])   . Hypercholesterolemia   . Hypertension   . 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 Personal history of chemotherapy   . Personal history of radiation therapy 2008      Past Surgical History:  Procedure Laterality Date  . BACK SURGERY    . BREAST LUMPECTOMY    . COLONOSCOPY     about 3 yrs ago in [place]  .  KNEE SURGERY     right  . TONSILLECTOMY        Social History:     Social History   Tobacco Use  . Smoking status: Never Smoker  . Smokeless tobacco: Never Used  Substance Use Topics  . Alcohol use: No    Alcohol/week: 0.0 standard drinks       Family History :     Family History  Problem Relation Age of Onset  . Colon cancer Neg Hx      Home Medications:   Prior to Admission medications   Medication Sig Start Date End Date Taking? Authorizing Provider  albuterol (PROVENTIL HFA;VENTOLIN HFA) 108 (90 Base) MCG/ACT inhaler Inhale 1 puff into the lungs every 6 (six) hours as needed for wheezing or shortness of breath.   Yes [provider]  amitriptyline (ELAVIL) 10 MG tablet 1-2 tablets at bedtime every day Patient taking differently: Take 10-20 mg by mouth at bedtime.  04/08/19  Yes Bayard Hugger, NP  atorvastatin (LIPITOR) 20 MG tablet Take 20 mg by mouth daily.   Yes  [provider]  budesonide-formoterol (SYMBICORT) 160-4.5 MCG/ACT inhaler Inhale 2 puffs into the lungs 2 (two) times daily as needed (shortness of breath).    Yes [provider]  ergocalciferol (VITAMIN D2) 1.25 MG (50000 UT) capsule Take 1 capsule (50,000 Units total) by mouth once a week. Patient taking differently: Take 50,000 Units by mouth every Sunday.  03/28/19  Yes Lockamy, Randi L, NP-C  fentaNYL (DURAGESIC) 25 MCG/HR Place 1 patch onto the skin every 3 (three) days. 11/28/19  Yes Derek Jack, MD  Lactulose 20 GM/30ML SOLN Please take 30 ml every 3 hours until you produce a bowel movement. Then take 30 ml at bedtime daily for constipation. 11/22/19  Yes Derek Jack, MD  LANTUS SOLOSTAR 100 UNIT/ML Solostar Pen Inject 24 Units into the skin at bedtime.  08/25/19  Yes [provider]  letrozole (FEMARA) 2.5 MG tablet Take 1 tablet (2.5 mg total) by mouth daily. 09/25/18  Yes Lockamy, Randi L, NP-C  loratadine (CLARITIN) 10 MG tablet Take 10 mg by mouth daily as needed for allergies.   Yes [provider]  losartan (COZAAR) 100 MG tablet Take 100 mg daily by mouth.   Yes [provider]  meclizine (ANTIVERT) 25 MG tablet Take 25 mg by mouth 2 (two) times daily as needed for dizziness.  03/14/19  Yes [provider]  megestrol (MEGACE) 20 MG tablet Take 20 mg by mouth 2 (two) times daily.   Yes [provider]  Multiple Vitamins-Minerals (PRESERVISION AREDS 2) CAPS Take 1 capsule by mouth daily.   Yes [provider]  naloxegol oxalate (MOVANTIK) 25 MG TABS tablet Take 1 tablet (25 mg total) by mouth daily. 01/13/17  Yes Bayard Hugger, NP  ondansetron (ZOFRAN) 4 MG tablet Take 4 mg by mouth 2 (two) times daily.  10/29/19  Yes [provider]  oxyCODONE-acetaminophen (PERCOCET) 10-325 MG tablet Take 1 tablet by mouth every 6 (six) hours as needed for pain. 10/31/19  Yes Bayard Hugger, NP  BD PEN  NEEDLE NANO 2ND GEN 32G X 4 MM MISC SMARTSIG:Injection Daily 09/19/19   [provider]  Blood Glucose Monitoring Suppl (ONETOUCH VERIO REFLECT) w/Device KIT USE TO TEST BLOOD SUGAR 2 TIMES A DAY 01/19/19   [provider]  calcium carbonate (OS-CAL - DOSED IN MG OF ELEMENTAL CALCIUM) 1250 (500 Ca) MG tablet Take 1,250 mg by mouth  daily with breakfast.  Patient not taking: Reported on 11/29/2019    Penland, Novella Olive, MD  HYDROmorphone (DILAUDID) 2 MG tablet Take 1 tablet (2 mg total) by mouth every 4 (four) hours as needed for severe pain. 11/28/19   Doreatha Massed, MD  Lancets (ONETOUCH DELICA PLUS Collier Bullock) MISC USE TO TEST BLOOD SUGAR 2 TIMES A DAY 01/19/19   [provider]  methocarbamol (ROBAXIN) 500 MG tablet TAKE 1 TABLET BY MOUTH EVERY 6 HOURS AS NEEDED FOR MUSCLE SPASMS. Patient taking differently: Take 500 mg by mouth every 6 (six) hours as needed for muscle spasms.  06/04/19   Jones Bales, NP  [facility] VERIO test strip 1 each by Other route 2 (two) times daily.  01/19/19   [provider]  pregabalin (LYRICA) 100 MG capsule Take 1 capsule (100 mg total) by mouth 2 (two) times daily. Patient not taking: Reported on 11/29/2019 10/31/19   Jones Bales, NP  senna (SENOKOT) 8.6 MG TABS tablet Take 8.6 mg by mouth daily.  Patient not taking: Reported on 11/29/2019    [provider]     Allergies:     Allergies  Allergen Reactions  . Other     Cat/dog dander  . Pollen Extract      Physical Exam:   Vitals  Blood pressure (!) 167/96, pulse (!) 122, temperature 100 F (37.8 C), temperature source Rectal, resp. rate (!) 21, weight 49.4 kg, SpO2 100 %.   1. General frail, thin appearing female, laying in bed in mild discomfort  2.  Patient is awake alert oriented x2, she is confused, slow to respond, answering some yes and no questions appropriately   3. No F.N deficits, ALL C.Nerves Intact, Strength 5/5 all 4  extremities, Sensation intact all 4 extremities, Plantars down going.  4. Ears and Eyes appear Normal, Conjunctivae clear, PERRLA. dry Oral Mucosa.  5. Supple Neck, No JVD, No cervical lymphadenopathy appriciated, No Carotid Bruits.  6. Symmetrical Chest wall movement, Good air movement bilaterally, CTAB.  7. tachycardic, No Gallops, Rubs or Murmurs, No Parasternal Heave.  8. Positive Bowel Sounds, Abdomen Soft, No tenderness, No organomegaly appriciated,No rebound -guarding or rigidity.  9.  No Cyanosis, Normal Skin Turgor, No Skin Rash or Bruise.  10. Good muscle tone,  joints appear normal , no effusions, Normal ROM.    Data Review:    CBC Recent Labs  Lab 11/28/19 1302 11/29/19 1437  WBC 9.5 6.3  HGB 10.7* 10.0*  HCT 34.3* 33.1*  PLT 449* 367  MCV 83.3 85.3  MCH 26.0 25.8*  MCHC 31.2 30.2  RDW 18.8* 19.1*  LYMPHSABS 3.1 0.6*  MONOABS 0.8 0.2  EOSABS 0.1 0.0  BASOSABS 0.0 0.0   ------------------------------------------------------------------------------------------------------------------  Chemistries  Recent Labs  Lab 11/28/19 1302 11/29/19 1054  NA 135 134*  K 3.4* 3.3*  CL 95* 98  CO2 28 26  GLUCOSE 119* 120*  BUN 30* 27*  CREATININE 1.60* 1.34*  CALCIUM >15.0* >15.0*  AST 53* 49*  ALT 15 15  ALKPHOS 187* 172*  BILITOT 0.4 0.6   ------------------------------------------------------------------------------------------------------------------ estimated creatinine clearance is 34.4 mL/min (A) (by C-G formula based on SCr of 1.34 mg/dL (H)). ------------------------------------------------------------------------------------------------------------------ No results for input(s): TSH, T4TOTAL, T3FREE, THYROIDAB in the last 72 hours.  Invalid input(s): FREET3  Coagulation profile Recent Labs  Lab 11/29/19 1437  INR 1.1   ------------------------------------------------------------------------------------------------------------------- No  results for input(s): DDIMER in the last 72 hours. -------------------------------------------------------------------------------------------------------------------  Cardiac Enzymes No results for  input(s): CKMB, TROPONINI, MYOGLOBIN in the last 168 hours.  Invalid input(s): CK ------------------------------------------------------------------------------------------------------------------ No results found for: BNP   ---------------------------------------------------------------------------------------------------------------  Urinalysis    Component Value Date/Time   COLORURINE STRAW (A) 11/29/2019 [place] 11/29/2019 1550   LABSPEC 1.008 11/29/2019 1550   PHURINE 7.0 11/29/2019 [place] 11/29/2019 1550   HGBUR NEGATIVE 11/29/2019 1550   BILIRUBINUR NEGATIVE 11/29/2019 1550   KETONESUR NEGATIVE 11/29/2019 1550   PROTEINUR NEGATIVE 11/29/2019 1550   NITRITE NEGATIVE 11/29/2019 1550   LEUKOCYTESUR NEGATIVE 11/29/2019 1550    ----------------------------------------------------------------------------------------------------------------   Imaging Results:    CT Head Wo Contrast  Result Date: 11/29/2019 CLINICAL DATA:  Delirium EXAM: CT HEAD WITHOUT CONTRAST TECHNIQUE: Contiguous axial images were obtained from the base of the skull through the vertex without intravenous contrast. COMPARISON:  None. FINDINGS: Brain: No acute infarct or intracranial hemorrhage. No mass lesion. No midline shift, ventriculomegaly or extra-axial fluid collection. Vascular: No hyperdense vessel or unexpected calcification. Carotid siphon atherosclerotic calcifications. Skull: Mottled, heterogenous appearance of the bifrontal, right parietal calvarium and clivus. There is a 1.6 cm right calvarial soft tissue focus with dehiscence of the bone (2:18). Sinuses/Orbits: Normal orbits. Clear paranasal sinuses. No mastoid effusion. Other: Asymmetric prominence of the right  temporalis muscle (2:12). IMPRESSION: No acute infarct or intracranial hemorrhage. Right frontal calvarial soft tissue, asymmetric prominence of the right temporalis muscle and calvarial/clivus heterogeneity is concerning for metastases. MRI head with and without contrast is recommended for further evaluation. Electronically Signed   By: Primitivo Gauze M.D.   On: 11/29/2019 16:37   MR Brain W and Wo Contrast  Result Date: 11/29/2019 CLINICAL DATA:  Dizziness, nonspecific.  Brain mass or lesion. EXAM: MRI HEAD WITHOUT AND WITH CONTRAST TECHNIQUE: Multiplanar, multiecho pulse sequences of the brain and surrounding structures were obtained without and with intravenous contrast. CONTRAST:  4mL GADAVIST GADOBUTROL 1 MMOL/ML IV SOLN COMPARISON:  11/29/2019 head CT. FINDINGS: Brain: No acute infarct. No acute intracranial hemorrhage. No midline shift, ventriculomegaly or extra-axial fluid collection. Diffuse dural thickening overlying the right cerebral convexity. Focal dural thickening overlying the left parietal lobe (18:117). 1.5 x 0.5 cm right dural-based lesion overlying the temporal convexity (18:67). There is also dural thickening along the clivus. Vascular: Preserved major intracranial flow voids. Skull and upper cervical spine: Multifocal T2 hyperintense enhancing calvarial lesions reflect osseous metastases. Enhancing 1.8 cm right frontal calvarial soft tissue (15:35) with dehiscence of the overlying cortex. 2.6 x 1.8 cm clival metastasis (15:10). Smaller enhancing osseous lesions are also seen involving the bilateral sphenoid wings and occipital condyles. Sinuses/Orbits: Normal orbits clear paranasal sinuses. Trace right mastoid effusion. Other: 1.4 x 0.8 cm enhancing soft tissue involving the left TMJ (18:40). Enhancing soft tissue infiltration involving the right temporalis muscle which is asymmetrically thickened (18:94). IMPRESSION: Dural-based metastatic disease demonstrating right predominance.  Focal plaque-like dural lesions measuring up to 1.5 x 0.5 cm overlie the right temporal and left parietal convexities. Multifocal osseous metastases with dominant lesions involving the clivus, right frontal calvarium and left TMJ. Metastatic involvement of the right temporalis muscle. Electronically Signed   By: Primitivo Gauze M.D.   On: 11/29/2019 19:25   DG Chest Port 1 View  Result Date: 11/29/2019 CLINICAL DATA:  Possible sepsis, metastatic breast cancer EXAM: PORTABLE CHEST 1 VIEW COMPARISON:  05/07/2019 FINDINGS: Increased interstitial prominence with patchy density. No pleural effusion or pneumothorax. Cardiomediastinal contours are within normal limits with normal heart size. IMPRESSION: Interstitial prominence and patchy  density, at least a component of which likely reflects metastatic disease seen on recent chest CT. There may be superimposed edema or atypical infection. Electronically Signed   By: Macy Mis M.D.   On: 11/29/2019 14:45    My personal review of EKG: Rhythm sinus tachycardia, Rate  123 /min, QTc 414  Assessment & Plan:    Active Problems:   Breast cancer of upper-outer quadrant of left female breast (HCC)   Lumbar radiculopathy   Chemotherapy-induced peripheral neuropathy (HCC)   Metastatic breast cancer (HCC)   Hypercalcemia   Hypercalcemia -Severe hypercalcemia with calcium > 15, in the setting of metastatic breast cancer with significant bone metastasis,. -She received Zometa by oncologist yesterday in his office. -He was started on calcitonin, received 1 dose by his office today, will continue with calcitonin 4 meg per cake twice daily total of 4 doses. -Hold calcium and vitamin D supplements. -Check PTH and PTH related peptide. -Start on aggressive IV hydration, for now we will continue with 300 cc/h, will monitor BMP closely, if response rate is low, will increase her IV fluids and may need to add low-dose Lasix as well.  Metastatic breast  cancer -With evidence of metastasis to liver, bone -I have discussed with oncology Dr. Delton Coombes MRI brain finding , and it is most likely due to skull metastasis more than leptomeningeal spread intracranial spread. -Received IV Decadron in ED, MRI brain finding most likely elated to skull metastasis more than leptomeningeal or brain mets, no evidence of midline shift or mass-effect, will discontinue Decadron. -Cussed case with Dr. Delton Coombes on admission.  Acute metabolic encephalopathy -The setting of hypercalcemia, well she is on multiple pain medications, and skull metastasis, but major contributing factor is hypercalcemia and patient should improve once hypercalcemia is corrected.  AKI in CKD -Avoid nephrotoxic medications, continue with IV fluids  Hypokalemia -Repleted, recheck in a.m.  Diabetes mellitus -We will resume Lantus at a lower dose 24> 15 units, will add insulin sliding scale.  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 -Continue with Lyrica, Robaxin  Opioid induced constipation -Continue with Movantik, and good bowel regimen  Protein calorie malnutrition -Consult nutritionist  DVT Prophylaxis Heparin  AM Labs Ordered, also please review Full Orders  Family Communication: Admission, patients condition and plan of care including tests being ordered have been discussed with the patient and husband who indicate understanding and agree with the plan and Code Status.  Code Status Full  Likely DC to  home  Condition GUARDED    Consults called: D/W oncology by phone    Admission status: inpatient    Time spent in minutes : 65 minutes   Phillips Climes M.D on 11/29/2019 at 8:35 PM   [facility] - Office  [phone]

## 2019-11-29 NOTE — Progress Notes (Signed)
CRITICAL VALUE ALERT  Critical Value:  Ca++ greater than 15  Date & Time Notied:  11/29/2019 at 1115  Provider Notified: Dr. Delton Coombes  Orders Received/Actions taken: give calcitonin 200 units [place] and NS 1.5 L over 1.5 hours.

## 2019-11-29 NOTE — Patient Instructions (Signed)
[facility]  Discharge Instructions:   _______________________________________________________________  Thank you for choosing [facility] to provide your oncology and hematology care.  To afford each patient quality time with our providers, please arrive at least 15 minutes before your scheduled appointment.  You need to re-schedule your appointment if you arrive 10 or more minutes late.  We strive to give you quality time with our providers, and arriving late affects you and other patients whose appointments are after yours.  Also, if you no show three or more times for appointments you may be dismissed from the clinic.  Again, thank you for choosing [facility]. Our hope is that these requests will allow you access to exceptional care and in a timely manner. _______________________________________________________________  If you have questions after your visit, please contact our office at [phone] between the hours of 8:30 a.m. and 5:00 p.m. Voicemails left after 4:30 p.m. will not be returned until the following business day. _______________________________________________________________  For prescription refill requests, have your pharmacy contact our office. _______________________________________________________________  Recommendations made by the consultant and any test results will be sent to your referring physician. _______________________________________________________________ 

## 2019-11-29 NOTE — ED Notes (Signed)
Pts silver bracelet placed in pt belonging bag in room

## 2019-11-29 NOTE — Progress Notes (Signed)
MEDICATION RELATED CONSULT NOTE - INITIAL   Pharmacy Consult for Hypercalcemia   Allergies  Allergen Reactions  . Other     Cat/dog dander  . Pollen Extract     Patient Measurements: Weight: 49.4 kg (108 lb 14.5 oz) Height: 5 ft 3 inches  Vital Signs: Temp: 100 F (37.8 C) (10/29 1554) Temp Source: Rectal (10/29 1554) BP: 167/96 (10/29 1800) Pulse Rate: 122 (10/29 1800) Intake/Output from previous day: No intake/output data recorded. Intake/Output from this shift: No intake/output data recorded.  Labs: Recent Labs    11/28/19 1302 11/29/19 1054 11/29/19 1437 11/29/19 2035  WBC 9.5  --  6.3  --   HGB 10.7*  --  10.0*  --   HCT 34.3*  --  33.1*  --   PLT 449*  --  367  --   APTT  --   --  31  --   CREATININE 1.60* 1.34*  --  1.02*  ALBUMIN 3.2* 3.1*  --   --   PROT 9.2* 8.6*  --   --   AST 53* 49*  --   --   ALT 15 15  --   --   ALKPHOS 187* 172*  --   --   BILITOT 0.4 0.6  --   --    Estimated Creatinine Clearance: 45.2 mL/min (A) (by C-G formula based on SCr of 1.02 mg/dL (H)).  Medical History: Past Medical History:  Diagnosis Date  . Asthma   . Back pain   . Breast cancer ([place]) 2008   left  . Diabetes ([place])   . Hypercholesterolemia   . Hypertension   . 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 Personal history of chemotherapy   . Personal history of radiation therapy 2008     Assessment: 59 y.o female with admitted on 10/29 with severe hypercalcemia with calcium>15, in the setting of metastatic breast cancer with significant bone metastasis. She received IV fluids and  Zometa by oncologist on  10/28 in his office. Then she received IV fluids and  calcitonin SQ in onc office today 10/29. Husband reports patient has more altered and confused over last 24 hours, as well has been weaker than her baseline.  Dr. Waldron [facility] has ordered to continue with calcitonin 4 ut/kg twice daily total of 4 doses.  After receiving one dose of Calcitonin, the calcium  level was checked at  @ 20:35  Calcium level down to  = 12.2 , coCa = 12.9  (albumin  3.1) +  AMS noted.    Goal of Therapy:  Corrected Ca <14 without AMS , or corrCa <12  Plan:  Will follow the calcium level results and discuss with MD if change in medication needed.  D/C after: CorrCa < 14 without AMS, or CorrCa < 12, or 4 doses, whichever occurs first.  Nicole Cella, RPh Clinical Pharmacist Please check AMION for all [place] phone numbers After 10:00 PM, call [place] [phone]  11/29/2019,10:13 PM

## 2019-11-29 NOTE — ED Notes (Signed)
Pt to MRI

## 2019-11-30 ENCOUNTER — Encounter (HOSPITAL_COMMUNITY): Payer: Self-pay | Admitting: Internal Medicine

## 2019-11-30 DIAGNOSIS — C50412 Malignant neoplasm of upper-outer quadrant of left female breast: Secondary | ICD-10-CM

## 2019-11-30 DIAGNOSIS — N179 Acute kidney failure, unspecified: Secondary | ICD-10-CM

## 2019-11-30 DIAGNOSIS — G9341 Metabolic encephalopathy: Secondary | ICD-10-CM

## 2019-11-30 DIAGNOSIS — Z170 Estrogen receptor positive status [ER+]: Secondary | ICD-10-CM

## 2019-11-30 LAB — BASIC METABOLIC PANEL
Anion gap: 9 (ref 5–15)
BUN: 12 mg/dL (ref 8–23)
CO2: 21 mmol/L — ABNORMAL LOW (ref 22–32)
Calcium: 9.5 mg/dL (ref 8.9–10.3)
Chloride: 106 mmol/L (ref 98–111)
Creatinine, Ser: 0.72 mg/dL (ref 0.44–1.00)
GFR, Estimated: >60 mL/min (ref >60)
Glucose, Bld: 120 mg/dL — ABNORMAL HIGH (ref 70–99)
Potassium: 3.2 mmol/L — ABNORMAL LOW (ref 3.5–5.1)
Sodium: 136 mmol/L (ref 135–145)

## 2019-11-30 LAB — BASIC METABOLIC PANEL WITH GFR
Anion gap: 9 (ref 5–15)
Anion gap: 10 (ref 5–15)
BUN: 13 mg/dL (ref 8–23)
BUN: 13 mg/dL (ref 8–23)
CO2: 20 mmol/L — ABNORMAL LOW (ref 22–32)
CO2: 19 mmol/L — ABNORMAL LOW (ref 22–32)
Calcium: 9.1 mg/dL (ref 8.9–10.3)
Calcium: 9.7 mg/dL (ref 8.9–10.3)
Chloride: 110 mmol/L (ref 98–111)
Chloride: 106 mmol/L (ref 98–111)
Creatinine, Ser: 0.73 mg/dL (ref 0.44–1.00)
Creatinine, Ser: 0.78 mg/dL (ref 0.44–1.00)
GFR, Estimated: >60 mL/min
GFR, Estimated: >60 mL/min
Glucose, Bld: 82 mg/dL (ref 70–99)
Glucose, Bld: 84 mg/dL (ref 70–99)
Potassium: 2.6 mmol/L — CL (ref 3.5–5.1)
Potassium: 3.3 mmol/L — ABNORMAL LOW (ref 3.5–5.1)
Sodium: 139 mmol/L (ref 135–145)
Sodium: 135 mmol/L (ref 135–145)

## 2019-11-30 LAB — CBC
HCT: 28.7 % — ABNORMAL LOW (ref 36.0–46.0)
Hemoglobin: 8.6 g/dL — ABNORMAL LOW (ref 12.0–15.0)
MCH: 25.7 pg — ABNORMAL LOW (ref 26.0–34.0)
MCHC: 30 g/dL (ref 30.0–36.0)
MCV: 85.9 fL (ref 80.0–100.0)
Platelets: 317 10*3/uL (ref 150–400)
RBC: 3.34 MIL/uL — ABNORMAL LOW (ref 3.87–5.11)
RDW: 19 % — ABNORMAL HIGH (ref 11.5–15.5)
WBC: 5.8 10*3/uL (ref 4.0–10.5)
nRBC: 0.3 % — ABNORMAL HIGH (ref 0.0–0.2)

## 2019-11-30 LAB — COMPREHENSIVE METABOLIC PANEL
ALT: 14 U/L (ref 0–44)
AST: 39 U/L (ref 15–41)
Albumin: 2.5 g/dL — ABNORMAL LOW (ref 3.5–5.0)
Alkaline Phosphatase: 140 U/L — ABNORMAL HIGH (ref 38–126)
Anion gap: 9 (ref 5–15)
BUN: 13 mg/dL (ref 8–23)
CO2: 23 mmol/L (ref 22–32)
Calcium: 9.8 mg/dL (ref 8.9–10.3)
Chloride: 106 mmol/L (ref 98–111)
Creatinine, Ser: 0.77 mg/dL (ref 0.44–1.00)
GFR, Estimated: >60 mL/min (ref >60)
Glucose, Bld: 90 mg/dL (ref 70–99)
Potassium: 2.9 mmol/L — ABNORMAL LOW (ref 3.5–5.1)
Sodium: 138 mmol/L (ref 135–145)
Total Bilirubin: 0.5 mg/dL (ref 0.3–1.2)
Total Protein: 7.3 g/dL (ref 6.5–8.1)

## 2019-11-30 LAB — HEMOGLOBIN A1C
Hgb A1c MFr Bld: 6.7 % — ABNORMAL HIGH (ref 4.8–5.6)
Mean Plasma Glucose: 145.59 mg/dL

## 2019-11-30 LAB — PROTIME-INR
INR: 1.3 — ABNORMAL HIGH (ref 0.8–1.2)
Prothrombin Time: 15.2 seconds (ref 11.4–15.2)

## 2019-11-30 LAB — GLUCOSE, CAPILLARY
Glucose-Capillary: 81 mg/dL (ref 70–99)
Glucose-Capillary: 112 mg/dL — ABNORMAL HIGH (ref 70–99)
Glucose-Capillary: 119 mg/dL — ABNORMAL HIGH (ref 70–99)
Glucose-Capillary: 110 mg/dL — ABNORMAL HIGH (ref 70–99)

## 2019-11-30 LAB — HIV ANTIBODY (ROUTINE TESTING W REFLEX): HIV Screen 4th Generation wRfx: NONREACTIVE

## 2019-11-30 LAB — MRSA PCR SCREENING: MRSA by PCR: POSITIVE — AB

## 2019-11-30 MED ORDER — MUPIROCIN 2 % EX OINT
TOPICAL_OINTMENT | Freq: Two times a day (BID) | CUTANEOUS | Status: DC
  Filled 2019-11-30: qty 22

## 2019-11-30 MED ORDER — ONDANSETRON HCL 4 MG PO TABS: 4.0000 mg | ORAL_TABLET | Freq: Four times a day (QID) | ORAL | Status: DC | PRN

## 2019-11-30 MED ORDER — CHLORHEXIDINE GLUCONATE CLOTH 2 % EX PADS
6.0000 | MEDICATED_PAD | Freq: Every day | CUTANEOUS | Status: DC
  Administered 2019-11-30 – 2019-12-06 (×7): 6 via TOPICAL

## 2019-11-30 MED ORDER — POTASSIUM CHLORIDE CRYS ER 20 MEQ PO TBCR
40.0000 meq | EXTENDED_RELEASE_TABLET | Freq: Once | ORAL | Status: AC
  Administered 2019-11-30: 40 meq via ORAL
  Filled 2019-11-30: qty 2

## 2019-11-30 MED ORDER — FENTANYL 25 MCG/HR TD PT72
1.0000 | MEDICATED_PATCH | TRANSDERMAL | Status: DC
  Administered 2019-12-02: 1 via TRANSDERMAL
  Filled 2019-11-30 (×2): qty 1

## 2019-11-30 MED ORDER — MEGESTROL ACETATE 40 MG PO TABS
20.0000 mg | ORAL_TABLET | Freq: Two times a day (BID) | ORAL | Status: DC
  Administered 2019-11-30 – 2019-12-06 (×12): 20 mg via ORAL
  Filled 2019-11-30 (×2): qty 0.5
  Filled 2019-11-30: qty 1
  Filled 2019-11-30 (×3): qty 0.5
  Filled 2019-11-30: qty 1
  Filled 2019-11-30 (×2): qty 0.5
  Filled 2019-11-30: qty 1
  Filled 2019-11-30: qty 0.5
  Filled 2019-11-30: qty 1
  Filled 2019-11-30 (×4): qty 0.5
  Filled 2019-11-30: qty 1
  Filled 2019-11-30 (×2): qty 0.5

## 2019-11-30 MED ORDER — OXYCODONE-ACETAMINOPHEN 5-325 MG PO TABS
1.0000 | ORAL_TABLET | Freq: Four times a day (QID) | ORAL | Status: DC | PRN
  Administered 2019-11-30: 1 via ORAL
  Filled 2019-11-30 (×2): qty 1

## 2019-11-30 MED ORDER — PREGABALIN 50 MG PO CAPS
100.0000 mg | ORAL_CAPSULE | Freq: Two times a day (BID) | ORAL | Status: DC
  Administered 2019-11-30 – 2019-12-06 (×13): 100 mg via ORAL
  Filled 2019-11-30 (×2): qty 1
  Filled 2019-11-30: qty 2
  Filled 2019-11-30 (×5): qty 1
  Filled 2019-11-30 (×2): qty 2
  Filled 2019-11-30: qty 1
  Filled 2019-11-30: qty 2
  Filled 2019-11-30: qty 1

## 2019-11-30 MED ORDER — METHOCARBAMOL 500 MG PO TABS
500.0000 mg | ORAL_TABLET | Freq: Four times a day (QID) | ORAL | Status: DC | PRN
  Administered 2019-12-05 – 2019-12-06 (×2): 500 mg via ORAL
  Filled 2019-11-30 (×3): qty 1

## 2019-11-30 MED ORDER — ENSURE ENLIVE PO LIQD
237.0000 mL | Freq: Two times a day (BID) | ORAL | Status: DC
  Administered 2019-11-30 – 2019-12-01 (×3): 237 mL via ORAL

## 2019-11-30 MED ORDER — ALBUTEROL SULFATE HFA 108 (90 BASE) MCG/ACT IN AERS: 1.0000 | INHALATION_SPRAY | Freq: Four times a day (QID) | RESPIRATORY_TRACT | Status: DC | PRN

## 2019-11-30 MED ORDER — OXYCODONE HCL 5 MG PO TABS
5.0000 mg | ORAL_TABLET | Freq: Four times a day (QID) | ORAL | Status: DC | PRN
  Administered 2019-11-30: 5 mg via ORAL
  Filled 2019-11-30 (×2): qty 1

## 2019-11-30 MED ORDER — LACTATED RINGERS IV SOLN: INTRAVENOUS | Status: DC

## 2019-11-30 MED ORDER — MAGNESIUM CITRATE PO SOLN: 1.0000 | Freq: Once | ORAL | Status: DC | PRN

## 2019-11-30 MED ORDER — BISACODYL 5 MG PO TBEC: 5.0000 mg | DELAYED_RELEASE_TABLET | Freq: Every day | ORAL | Status: DC | PRN

## 2019-11-30 MED ORDER — HYDROMORPHONE HCL 2 MG PO TABS
2.0000 mg | ORAL_TABLET | ORAL | Status: DC | PRN
  Administered 2019-11-30 – 2019-12-02 (×6): 4 mg via ORAL
  Administered 2019-12-03: 2 mg via ORAL
  Administered 2019-12-03 – 2019-12-05 (×7): 4 mg via ORAL
  Administered 2019-12-05: 2 mg via ORAL
  Administered 2019-12-05 – 2019-12-06 (×2): 4 mg via ORAL
  Filled 2019-11-30: qty 1
  Filled 2019-11-30 (×3): qty 2
  Filled 2019-11-30: qty 1
  Filled 2019-11-30: qty 2
  Filled 2019-11-30: qty 1
  Filled 2019-11-30 (×11): qty 2
  Filled 2019-11-30: qty 1
  Filled 2019-11-30: qty 2

## 2019-11-30 MED ORDER — ONDANSETRON HCL 4 MG/2ML IJ SOLN
4.0000 mg | Freq: Four times a day (QID) | INTRAMUSCULAR | Status: DC | PRN
  Administered 2019-11-30: 4 mg via INTRAVENOUS
  Filled 2019-11-30: qty 2

## 2019-11-30 MED ORDER — SENNA 8.6 MG PO TABS
8.6000 mg | ORAL_TABLET | Freq: Every day | ORAL | Status: DC
  Administered 2019-11-30 – 2019-12-06 (×6): 8.6 mg via ORAL
  Filled 2019-11-30 (×6): qty 1

## 2019-11-30 MED ORDER — POLYETHYLENE GLYCOL 3350 17 G PO PACK: 17.0000 g | PACK | Freq: Every day | ORAL | Status: DC | PRN

## 2019-11-30 MED ORDER — INSULIN GLARGINE 100 UNIT/ML ~~LOC~~ SOLN
15.0000 [IU] | Freq: Every day | SUBCUTANEOUS | Status: DC
  Administered 2019-11-30 (×2): 15 [IU] via SUBCUTANEOUS
  Filled 2019-11-30 (×4): qty 0.15

## 2019-11-30 MED ORDER — LETROZOLE 2.5 MG PO TABS
2.5000 mg | ORAL_TABLET | Freq: Every day | ORAL | Status: DC
  Administered 2019-11-30 – 2019-12-06 (×7): 2.5 mg via ORAL
  Filled 2019-11-30 (×10): qty 1

## 2019-11-30 MED ORDER — NALOXEGOL OXALATE 25 MG PO TABS
25.0000 mg | ORAL_TABLET | Freq: Every day | ORAL | Status: DC
  Administered 2019-11-30 – 2019-12-06 (×6): 25 mg via ORAL
  Filled 2019-11-30 (×10): qty 1

## 2019-11-30 MED ORDER — MOMETASONE FURO-FORMOTEROL FUM 200-5 MCG/ACT IN AERO
2.0000 | INHALATION_SPRAY | Freq: Two times a day (BID) | RESPIRATORY_TRACT | Status: DC
  Administered 2019-11-30 – 2019-12-06 (×11): 2 via RESPIRATORY_TRACT
  Filled 2019-11-30 (×2): qty 8.8

## 2019-11-30 MED ORDER — OXYCODONE-ACETAMINOPHEN 10-325 MG PO TABS: 1.0000 | ORAL_TABLET | Freq: Four times a day (QID) | ORAL | Status: DC | PRN

## 2019-11-30 MED ORDER — HEPARIN SODIUM (PORCINE) 5000 UNIT/ML IJ SOLN
5000.0000 [IU] | Freq: Three times a day (TID) | INTRAMUSCULAR | Status: DC
  Administered 2019-11-30 – 2019-12-06 (×18): 5000 [IU] via SUBCUTANEOUS
  Filled 2019-11-30 (×17): qty 1

## 2019-11-30 MED ORDER — AMITRIPTYLINE HCL 10 MG PO TABS
20.0000 mg | ORAL_TABLET | Freq: Every day | ORAL | Status: DC
  Administered 2019-11-30 – 2019-12-05 (×7): 20 mg via ORAL
  Filled 2019-11-30 (×10): qty 2

## 2019-11-30 NOTE — Progress Notes (Signed)
Initial Nutrition Assessment  DOCUMENTATION CODES:   Not applicable  INTERVENTION:  Continue Ensure Enlive po BID, each supplement provides 350 kcal and 20 grams of protein  Recommend liberalizing diet to regular  NUTRITION DIAGNOSIS:   Increased nutrient needs related to cancer and cancer related treatments as evidenced by estimated needs.    GOAL:   Patient will meet greater than or equal to 90% of their needs    MONITOR:   Labs, I & O's, Supplement acceptance, PO intake, Weight trends  REASON FOR ASSESSMENT:   Malnutrition Screening Tool, Consult Assessment of nutrition requirement/status  ASSESSMENT:  RD working remotely.  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 Husband reports increased confusion and weakness over the past 24 hours as well as 3 week history of headaches, poor appetite/intake and chronic nausea without vomiting.  Patient is followed by outpt RD at cancer center, last contact on 10/22. Per notes, pt reports some improvement to appetite with initiation of appetite stimulant. Recalled an english muffin with egg, cheese and sausage for breakfast, wonton for lunch and sometimes not eating dinner. Outpt RD discussed ways to increase calories and protein, encouraged frequent small meals/snacks, and was provided a complimentary case of Ensure Enlive (to be picked up on 10/28) She is currently on a CM diet and consumed 25% of lunch today. Noted A1c 6.7 this admission indicating well controlled diabetes. Given history of poor appetite and intake, recommend liberalizing diet to regular in efforts to encourage po intake. She is consuming Ensure supplements BID, will continue this to aid with meeting needs.   Per chart, weights  have decreased ~22 lbs (15.6%) in the last 3.5 months; significant however, weights have been trending up in the last 3 weeks. Given trends as well as history significant of metastatic breast cancer, highly suspect degree of malnutrition, however unable to identify at this time. Will plan to complete exam at follow-up.  Type 1 BM documented today consistent with constipation, scheduled Senokot 8.6 mg daily started 1000.  I/Os: +1449.9 ml since admit UOP: 800 ml x 24 hours  Medications reviewed and include: Elavil, SSI, Lantus 15 units daily, Femara, Megace, Movantik, Lyrica, Senokot LR @ 125 ml/hr  Labs: CBGs 112,81, K 3.2 (L) trending up, RBC 3.34 (L), Hgb 8.6 (L), HCT 28.7 (L) 11/29/19 A1c 6.7 - well controlled  NUTRITION - FOCUSED PHYSICAL EXAM: Unable to complete at this time, RD working remotely.  Diet Order:   Diet Order            Diet Carb Modified Fluid consistency: Thin; Room service appropriate? Yes  Diet effective now                 EDUCATION NEEDS:   No education needs have been identified at this time  Skin:  Skin Assessment: Reviewed RN Assessment  Last BM:  10/30 type 1  Height:   Ht Readings from Last 1 Encounters:  11/19/19 5\' 3"  (1.6 m)    Weight:   Wt Readings from Last 1 Encounters:  11/30/19 53.2 kg    BMI:  Body mass index is 20.78 kg/m.  Estimated Nutritional Needs:   Kcal:  1600-1800  Protein:  80-90  Fluid:  >/= 1.5 L/day   Lajuan Lines, RD, LDN Clinical Nutrition After Hours/Weekend Pager # in [place]

## 2019-11-30 NOTE — Progress Notes (Signed)
Pt requested something for pain. Her order was for dilaudid PO which our pyxis was out of. Emeline Darling, RN/AC brought me 4mg  to give the patient. When I went into the room to tell the patient I had her pain medicine she said she no longer wanted it. I then returned the medicine to the ICU pyxis. Witnessed by Rosary Lively, RN

## 2019-11-30 NOTE — Progress Notes (Signed)
MEDICATION RELATED CONSULT NOTE - INITIAL   Pharmacy Consult for Hypercalcemia   Allergies  Allergen Reactions  . Other     Cat/dog dander  . Pollen Extract     Patient Measurements: Weight: 51.2 kg (112 lb 14 oz) Height: 5 ft 3 inches  Vital Signs: Temp: 99 F (37.2 C) (10/30 0036) Temp Source: Oral (10/30 0036) BP: 159/87 (10/29 2351) Pulse Rate: 110 (10/29 2351) Intake/Output from previous day: No intake/output data recorded. Intake/Output from this shift: No intake/output data recorded.  Labs: Recent Labs    11/28/19 1302 11/28/19 1302 11/29/19 1054 11/29/19 1437 11/29/19 2035 11/30/19 0045  WBC 9.5  --   --  6.3  --   --   HGB 10.7*  --   --  10.0*  --   --   HCT 34.3*  --   --  33.1*  --   --   PLT 449*  --   --  367  --   --   APTT  --   --   --  31  --   --   CREATININE 1.60*   < > 1.34*  --  1.02* 0.73  ALBUMIN 3.2*  --  3.1*  --   --   --   PROT 9.2*  --  8.6*  --   --   --   AST 53*  --  49*  --   --   --   ALT 15  --  15  --   --   --   ALKPHOS 187*  --  172*  --   --   --   BILITOT 0.4  --  0.6  --   --   --    < > = values in this interval not displayed.   Estimated Creatinine Clearance: 59.7 mL/min (by C-G formula based on SCr of 0.73 mg/dL).  Assessment: 59 y.o female with admitted on 10/29 with severe hypercalcemia with calcium>15, in the setting of metastatic breast cancer with significant bone metastasis. She received IV fluids and  Zometa by oncologist on  10/28 in his office. Then she received IV fluids and  calcitonin SQ in onc office today 10/29. Husband reports patient has more altered and confused over last 24 hours, as well has been weaker than her baseline.  Dr. Waldron Labs has ordered to continue with calcitonin 4 ut/kg twice daily total of 4 doses.  After receiving one dose of Calcitonin, the calcium level was checked at  @ 20:35  Calcium level down to  = 12.2 , coCa = 12.9  (albumin  3.1) +  AMS noted.   Calcium level now down to  9.1 (10/30 0045) - back in normal range  Goal of Therapy:  Corrected Ca <14 without AMS , or corrCa <12  Plan:  Will d/c calcitonin with calcium back down to nl Continue to f/u BMET as ordered per MD  Sherlon Handing, PharmD, BCPS Please see amion for complete clinical pharmacist phone list 11/30/2019,1:56 AM

## 2019-11-30 NOTE — Progress Notes (Addendum)
PROGRESS NOTE    Krista Doyle  ZDG:[id number] DOB: Aug 22, 1960 DOA: 11/29/2019 PCP: [name], MD    Brief Narrative:  Krista Doyle  is a 59 y.o. female, with past medical history of hypertension, hyperlipidemia, chemotherapy-induced neuropathy, diabetes mellitus, history of breast cancer visually diagnosed in 2008, status post radiation therapy and chemotherapy,  with recent diagnosis of metastatic disease to liver and osseous metastasis, patient was sent from oncology office for hypercalcemia, patient was recently diagnosed with lumbar/thoracic spine osseous metastasis, liver metastasis as well, patient work-up was significant for hypercalcemia at oncology office yesterday, where she received IV fluids and IV Zometa, today as well she received IV fluids and calcitonin in oncology office and sent to ED for further management, husband reports patient has more altered and confused over last 24 hours, as well has been weaker than her baseline, she has been reporting headache for 3 weeks, and has poor oral appetite, and oral intake, she has chronic nausea, but no vomiting. - in ED her calcium came<15, elevated at 1.3, baseline 0.8, potassium low at 3.3, MRI brain significant for 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   Assessment & Plan:   Active Problems:   Breast cancer of upper-outer quadrant of left female breast ([place])   Lumbar radiculopathy   Chemotherapy-induced peripheral neuropathy ([facility])   Metastatic breast cancer ([facility])   Hypercalcemia   Hypercalcemia -Patient admitted with severe hypercalcemia greater than 15 in the setting of metastatic breast cancer -She received Zometa prior to admission -She was also started on calcitonin -She was aggressively hydrated with IV fluids -Overall serum calcium has normalized -PTH, PTH related peptide in process  Metastatic breast  cancer -Evidence of metastases to liver, bone, lung -Continue follow-up with oncology -MRI brain showed evidence of skull metastases.  Discussed with oncology and felt to be unlikely related to leptomeningeal spread or intracranial spread.  No midline shift or mass-effect on imaging  Acute metabolic encephalopathy -Related to hypercalcemia -Mental status improving although not back to baseline per family  Hypokalemia -Replace  Acute kidney injury -Creatinine of 0.8 at baseline -Peak creatinine up to 1.6 -This is since trended down with IV fluids  Insulin-dependent diabetes.   -On Lantus and sliding scale insulin -Continue to follow blood sugars  Chronic pain syndrome secondary to underlying metastatic breast cancer -Currently on fentanyl patch and Dilaudid as needed -Continue with Lyrica and Robaxin  Opioid-induced constipation -Continue with Movantik  Sinus tachycardia -Heart rate persistently in the 110s to 120s -She is not hypoxic or short of breath -Blood pressure mildly elevated -Possibly related to uncontrolled pain -Continue to monitor for now  DVT prophylaxis: heparin injection 5,000 Units Start: 11/30/19 0600 SCDs Start: 11/30/19 0005  Code Status: Full code Family Communication: Discussed with husband at the bedside and son over the phone Disposition Plan: Status is: Inpatient  Remains inpatient appropriate because:Hemodynamically unstable   Dispo: The patient is from: Home              Anticipated d/c is to: TBD              Anticipated d/c date is: 2 days              Patient currently is not medically stable to d/c.         Consultants:     Procedures:     Antimicrobials:       Subjective: She is sitting up  in bed and is awake.  She is no she is in the hospital.  Husband feels like confusion is improving  Objective: Vitals:   11/30/19 1637 11/30/19 1900 11/30/19 2000 11/30/19 2002  BP:  (!) 168/90 (!) 160/98   Pulse:  (!) 117  (!) 118   Resp:   16   Temp: 99.3 F (37.4 C)   98.9 F (37.2 C)  TempSrc: Oral   Oral  SpO2:  100% 100%   Weight:        Intake/Output Summary (Last 24 hours) at 11/30/2019 2022 Last data filed at 11/30/2019 1838 Gross per 24 hour  Intake 4333.06 ml  Output 2400 ml  Net 1933.06 ml   Filed Weights   11/29/19 1346 11/30/19 0036 11/30/19 0444  Weight: 49.4 kg 51.2 kg 53.2 kg    Examination:  General exam: Appears calm and comfortable  Respiratory system: Clear to auscultation. Respiratory effort normal. Cardiovascular system: S1 & S2 heard, RRR. No JVD, murmurs, rubs, gallops or clicks. No pedal edema. Gastrointestinal system: Abdomen is nondistended, soft and nontender. No organomegaly or masses felt. Normal bowel sounds heard. Central nervous system: Alert and oriented. No focal neurological deficits. Extremities: Symmetric 5 x 5 power. Skin: No rashes, lesions or ulcers Psychiatry: Calm, pleasant, cooperative    Data Reviewed: I have personally reviewed following labs and imaging studies  CBC: Recent Labs  Lab 11/28/19 1302 11/29/19 1437 11/30/19 0457  WBC 9.5 6.3 5.8  NEUTROABS 5.5 5.5  --   HGB 10.7* 10.0* 8.6*  HCT 34.3* 33.1* 28.7*  MCV 83.3 85.3 85.9  PLT 449* 367 161   Basic Metabolic Panel: Recent Labs  Lab 11/29/19 2035 11/30/19 0045 11/30/19 0457 11/30/19 0835 11/30/19 1227  NA 136 139 138 135 136  K 3.0* 2.6* 2.9* 3.3* 3.2*  CL 103 110 106 106 106  CO2 25 20* 23 19* 21*  GLUCOSE 108* 82 90 84 120*  BUN 19 13 13 13 12   CREATININE 1.02* 0.73 0.77 0.78 0.72  CALCIUM 12.2* 9.1 9.8 9.7 9.5   GFR: Estimated Creatinine Clearance: 61.1 mL/min (by C-G formula based on SCr of 0.72 mg/dL). Liver Function Tests: Recent Labs  Lab 11/28/19 1302 11/29/19 1054 11/30/19 0457  AST 53* 49* 39  ALT 15 15 14   ALKPHOS 187* 172* 140*  BILITOT 0.4 0.6 0.5  PROT 9.2* 8.6* 7.3  ALBUMIN 3.2* 3.1* 2.5*   No results for input(s): LIPASE, AMYLASE in the  last 168 hours. No results for input(s): AMMONIA in the last 168 hours. Coagulation Profile: Recent Labs  Lab 11/29/19 1437 11/30/19 0457  INR 1.1 1.3*   Cardiac Enzymes: No results for input(s): CKTOTAL, CKMB, CKMBINDEX, TROPONINI in the last 168 hours. BNP (last 3 results) No results for input(s): PROBNP in the last 8760 hours. HbA1C: Recent Labs    11/29/19 1444  HGBA1C 6.7*   CBG: Recent Labs  Lab 11/29/19 2325 11/30/19 0758 11/30/19 1134 11/30/19 1614 11/30/19 1953  GLUCAP 82 81 112* 119* 110*   Lipid Profile: No results for input(s): CHOL, HDL, LDLCALC, TRIG, CHOLHDL, LDLDIRECT in the last 72 hours. Thyroid Function Tests: No results for input(s): TSH, T4TOTAL, FREET4, T3FREE, THYROIDAB in the last 72 hours. Anemia Panel: Recent Labs    11/28/19 1301 11/28/19 1302  VITAMINB12 >7,500*  --   FOLATE  --  16.6  FERRITIN 1,345*  --   TIBC 387  --   IRON 102  --    Sepsis Labs: Recent Labs  Lab 11/29/19 1437 11/29/19 1629  LATICACIDVEN 1.6 1.6    Recent Results (from the past 240 hour(s))  Respiratory Panel by RT PCR (Flu A&B, Covid) - Nasopharyngeal Swab     Status: None   Collection Time: 11/29/19  2:05 PM   Specimen: Nasopharyngeal Swab  Result Value Ref Range Status   SARS Coronavirus 2 by RT PCR NEGATIVE NEGATIVE Final    Comment: (NOTE) SARS-CoV-2 target nucleic acids are NOT DETECTED.  The SARS-CoV-2 RNA is generally detectable in upper respiratoy specimens during the acute phase of infection. The lowest concentration of SARS-CoV-2 viral copies this assay can detect is 131 copies/mL. A negative result does not preclude SARS-Cov-2 infection and should not be used as the sole basis for treatment or other patient management decisions. A negative result may occur with  improper specimen collection/handling, submission of specimen other than nasopharyngeal swab, presence of viral mutation(s) within the areas targeted by this assay, and inadequate  number of viral copies (<131 copies/mL). A negative result must be combined with clinical observations, patient history, and epidemiological information. The expected result is Negative.  Fact Sheet for Patients:  PinkCheek.be  Fact Sheet for Healthcare Providers:  GravelBags.it  This test is no t yet approved or cleared by the [place] FDA and  has been authorized for detection and/or diagnosis of SARS-CoV-2 by FDA under an Emergency Use Authorization (EUA). This EUA will remain  in effect (meaning this test can be used) for the duration of the COVID-19 declaration under Section 564(b)(1) of the Act, 21 U.S.C. section 360bbb-3(b)(1), unless the authorization is terminated or revoked sooner.     Influenza A by PCR NEGATIVE NEGATIVE Final   Influenza B by PCR NEGATIVE NEGATIVE Final    Comment: (NOTE) The Xpert Xpress SARS-CoV-2/FLU/RSV assay is intended as an aid in  the diagnosis of influenza from Nasopharyngeal swab specimens and  should not be used as a sole basis for treatment. Nasal washings and  aspirates are unacceptable for Xpert Xpress SARS-CoV-2/FLU/RSV  testing.  Fact Sheet for Patients: PinkCheek.be  Fact Sheet for Healthcare Providers: GravelBags.it  This test is not yet approved or cleared by the [place] FDA and  has been authorized for detection and/or diagnosis of SARS-CoV-2 by  FDA under an Emergency Use Authorization (EUA). This EUA will remain  in effect (meaning this test can be used) for the duration of the  Covid-19 declaration under Section 564(b)(1) of the Act, 21  U.S.C. section 360bbb-3(b)(1), unless the authorization is  terminated or revoked. Performed at [facility], [address] [place], [place] [address]   Blood culture (routine single)     Status: None (Preliminary result)   Collection Time: 11/29/19  2:45 PM    Specimen: BLOOD  Result Value Ref Range Status   Specimen Description BLOOD  Final   Special Requests NONE  Final   Culture   Final    NO GROWTH < 24 HOURS Performed at [facility], [address] [place], [place] [address]    Report Status PENDING  Incomplete  MRSA PCR Screening     Status: Abnormal   Collection Time: 11/30/19 12:04 AM   Specimen: Nasopharyngeal  Result Value Ref Range Status   MRSA by PCR POSITIVE (A) NEGATIVE Final    Comment:        The GeneXpert MRSA Assay (FDA approved for NASAL specimens only), is one component of a comprehensive MRSA colonization surveillance program. It is not intended to diagnose MRSA infection  nor to guide or monitor treatment for MRSA infections. RESULT CALLED TO, READ BACK BY AND VERIFIED WITH: ALSTON @ [id number] ON [id number] BY HENDERSON L. Performed at [facility], [address] [place], [place] [address]          Radiology Studies: CT Head Wo Contrast  Result Date: 11/29/2019 CLINICAL DATA:  Delirium EXAM: CT HEAD WITHOUT CONTRAST TECHNIQUE: Contiguous axial images were obtained from the base of the skull through the vertex without intravenous contrast. COMPARISON:  None. FINDINGS: Brain: No acute infarct or intracranial hemorrhage. No mass lesion. No midline shift, ventriculomegaly or extra-axial fluid collection. Vascular: No hyperdense vessel or unexpected calcification. Carotid siphon atherosclerotic calcifications. Skull: Mottled, heterogenous appearance of the bifrontal, right parietal calvarium and clivus. There is a 1.6 cm right calvarial soft tissue focus with dehiscence of the bone (2:18). Sinuses/Orbits: Normal orbits. Clear paranasal sinuses. No mastoid effusion. Other: Asymmetric prominence of the right temporalis muscle (2:12). IMPRESSION: No acute infarct or intracranial hemorrhage. Right frontal calvarial soft tissue, asymmetric prominence of the right temporalis muscle and calvarial/clivus heterogeneity is concerning  for metastases. MRI head with and without contrast is recommended for further evaluation. Electronically Signed   By: Primitivo Gauze M.D.   On: 11/29/2019 16:37   MR Brain W and Wo Contrast  Result Date: 11/29/2019 CLINICAL DATA:  Dizziness, nonspecific.  Brain mass or lesion. EXAM: MRI HEAD WITHOUT AND WITH CONTRAST TECHNIQUE: Multiplanar, multiecho pulse sequences of the brain and surrounding structures were obtained without and with intravenous contrast. CONTRAST:  5mL GADAVIST GADOBUTROL 1 MMOL/ML IV SOLN COMPARISON:  11/29/2019 head CT. FINDINGS: Brain: No acute infarct. No acute intracranial hemorrhage. No midline shift, ventriculomegaly or extra-axial fluid collection. Diffuse dural thickening overlying the right cerebral convexity. Focal dural thickening overlying the left parietal lobe (18:117). 1.5 x 0.5 cm right dural-based lesion overlying the temporal convexity (18:67). There is also dural thickening along the clivus. Vascular: Preserved major intracranial flow voids. Skull and upper cervical spine: Multifocal T2 hyperintense enhancing calvarial lesions reflect osseous metastases. Enhancing 1.8 cm right frontal calvarial soft tissue (15:35) with dehiscence of the overlying cortex. 2.6 x 1.8 cm clival metastasis (15:10). Smaller enhancing osseous lesions are also seen involving the bilateral sphenoid wings and occipital condyles. Sinuses/Orbits: Normal orbits clear paranasal sinuses. Trace right mastoid effusion. Other: 1.4 x 0.8 cm enhancing soft tissue involving the left TMJ (18:40). Enhancing soft tissue infiltration involving the right temporalis muscle which is asymmetrically thickened (18:94). IMPRESSION: Dural-based metastatic disease demonstrating right predominance. Focal plaque-like dural lesions measuring up to 1.5 x 0.5 cm overlie the right temporal and left parietal convexities. Multifocal osseous metastases with dominant lesions involving the clivus, right frontal calvarium and  left TMJ. Metastatic involvement of the right temporalis muscle. Electronically Signed   By: Primitivo Gauze M.D.   On: 11/29/2019 19:25   DG Chest Port 1 View  Result Date: 11/29/2019 CLINICAL DATA:  Possible sepsis, metastatic breast cancer EXAM: PORTABLE CHEST 1 VIEW COMPARISON:  05/07/2019 FINDINGS: Increased interstitial prominence with patchy density. No pleural effusion or pneumothorax. Cardiomediastinal contours are within normal limits with normal heart size. IMPRESSION: Interstitial prominence and patchy density, at least a component of which likely reflects metastatic disease seen on recent chest CT. There may be superimposed edema or atypical infection. Electronically Signed   By: Macy Mis M.D.   On: 11/29/2019 14:45        Scheduled Meds: . amitriptyline  20 mg Oral QHS  . Chlorhexidine Gluconate Cloth  6 each Topical Daily  . feeding supplement  237 mL Oral BID BM  . [START ON 12/02/2019] fentaNYL  1 patch Transdermal Q72H  . heparin  5,000 Units Subcutaneous Q8H  . insulin aspart  0-5 Units Subcutaneous QHS  . insulin aspart  0-9 Units Subcutaneous TID WC  . insulin glargine  15 Units Subcutaneous QHS  . letrozole  2.5 mg Oral Daily  . megestrol  20 mg Oral BID  . mometasone-formoterol  2 puff Inhalation BID  . mupirocin ointment   Nasal BID  . naloxegol oxalate  25 mg Oral Daily  . pregabalin  100 mg Oral BID  . senna  8.6 mg Oral Daily   Continuous Infusions: . lactated ringers 125 mL/hr at 11/30/19 1353     LOS: 1 day    Time spent: 15mins    Kathie Dike, MD [facility]   If 7PM-7AM, please contact night-coverage [email or web address]  11/30/2019, 8:22 PM

## 2019-11-30 NOTE — Progress Notes (Signed)
Nasal swab was positive for MRSA of the nares.

## 2019-12-01 ENCOUNTER — Inpatient Hospital Stay (HOSPITAL_COMMUNITY): Payer: BC Managed Care – PPO

## 2019-12-01 ENCOUNTER — Encounter (HOSPITAL_COMMUNITY): Payer: Self-pay

## 2019-12-01 DIAGNOSIS — Z95828 Presence of other vascular implants and grafts: Secondary | ICD-10-CM

## 2019-12-01 HISTORY — DX: Presence of other vascular implants and grafts: Z95.828

## 2019-12-01 LAB — COMPREHENSIVE METABOLIC PANEL
ALT: 16 U/L (ref 0–44)
AST: 49 U/L — ABNORMAL HIGH (ref 15–41)
Albumin: 2.7 g/dL — ABNORMAL LOW (ref 3.5–5.0)
Alkaline Phosphatase: 162 U/L — ABNORMAL HIGH (ref 38–126)
Anion gap: 9 (ref 5–15)
BUN: 10 mg/dL (ref 8–23)
CO2: 22 mmol/L (ref 22–32)
Calcium: 8.8 mg/dL — ABNORMAL LOW (ref 8.9–10.3)
Chloride: 102 mmol/L (ref 98–111)
Creatinine, Ser: 0.62 mg/dL (ref 0.44–1.00)
GFR, Estimated: >60 mL/min (ref >60)
Glucose, Bld: 82 mg/dL (ref 70–99)
Potassium: 3.1 mmol/L — ABNORMAL LOW (ref 3.5–5.1)
Sodium: 133 mmol/L — ABNORMAL LOW (ref 135–145)
Total Bilirubin: 0.3 mg/dL (ref 0.3–1.2)
Total Protein: 8 g/dL (ref 6.5–8.1)

## 2019-12-01 LAB — GLUCOSE, CAPILLARY
Glucose-Capillary: 64 mg/dL — ABNORMAL LOW (ref 70–99)
Glucose-Capillary: 85 mg/dL (ref 70–99)
Glucose-Capillary: 75 mg/dL (ref 70–99)
Glucose-Capillary: 73 mg/dL (ref 70–99)

## 2019-12-01 LAB — URINE CULTURE: Culture: NO GROWTH

## 2019-12-01 LAB — TSH: TSH: 0.405 u[IU]/mL (ref 0.350–4.500)

## 2019-12-01 LAB — CBC
HCT: 27.9 % — ABNORMAL LOW (ref 36.0–46.0)
Hemoglobin: 8.7 g/dL — ABNORMAL LOW (ref 12.0–15.0)
MCH: 25.7 pg — ABNORMAL LOW (ref 26.0–34.0)
MCHC: 31.2 g/dL (ref 30.0–36.0)
MCV: 82.5 fL (ref 80.0–100.0)
Platelets: 311 10*3/uL (ref 150–400)
RBC: 3.38 MIL/uL — ABNORMAL LOW (ref 3.87–5.11)
RDW: 19.2 % — ABNORMAL HIGH (ref 11.5–15.5)
WBC: 9.6 10*3/uL (ref 4.0–10.5)
nRBC: 0.2 % (ref 0.0–0.2)

## 2019-12-01 LAB — T4, FREE: Free T4: 0.62 ng/dL (ref 0.61–1.12)

## 2019-12-01 LAB — D-DIMER, QUANTITATIVE: D-Dimer, Quant: 4.5 ug/mL-FEU — ABNORMAL HIGH (ref 0.00–0.50)

## 2019-12-01 LAB — PTH, INTACT AND CALCIUM
Calcium, Total (PTH): 11.6 mg/dL — ABNORMAL HIGH (ref 8.7–10.3)
PTH: 15 pg/mL (ref 15–65)

## 2019-12-01 LAB — CORTISOL-AM, BLOOD: Cortisol - AM: 7.2 ug/dL (ref 6.7–22.6)

## 2019-12-01 MED ORDER — BOOST / RESOURCE BREEZE PO LIQD CUSTOM
1.0000 | Freq: Three times a day (TID) | ORAL | Status: DC
  Administered 2019-12-01 – 2019-12-06 (×14): 1 via ORAL

## 2019-12-01 MED ORDER — IOHEXOL 350 MG/ML SOLN
75.0000 mL | Freq: Once | INTRAVENOUS | Status: AC | PRN
  Administered 2019-12-01: 75 mL via INTRAVENOUS

## 2019-12-01 MED ORDER — PROCHLORPERAZINE MALEATE 10 MG PO TABS: 10.0000 mg | ORAL_TABLET | Freq: Four times a day (QID) | ORAL | 1 refills | Status: AC | PRN

## 2019-12-01 MED ORDER — MAGIC MOUTHWASH W/LIDOCAINE
15.0000 mL | Freq: Three times a day (TID) | ORAL | Status: DC
  Administered 2019-12-01 – 2019-12-06 (×16): 15 mL via ORAL
  Filled 2019-12-01 (×13): qty 15

## 2019-12-01 MED ORDER — METOPROLOL TARTRATE 25 MG PO TABS
25.0000 mg | ORAL_TABLET | Freq: Two times a day (BID) | ORAL | Status: DC
  Administered 2019-12-01 – 2019-12-04 (×6): 25 mg via ORAL
  Filled 2019-12-01 (×6): qty 1

## 2019-12-01 MED ORDER — POTASSIUM CHLORIDE 20 MEQ PO PACK
40.0000 meq | PACK | Freq: Once | ORAL | Status: AC
  Administered 2019-12-01: 40 meq via ORAL
  Filled 2019-12-01: qty 2

## 2019-12-01 MED ORDER — LIDOCAINE-PRILOCAINE 2.5-2.5 % EX CREA: TOPICAL_CREAM | CUTANEOUS | 3 refills | Status: AC

## 2019-12-01 NOTE — Progress Notes (Signed)
PROGRESS NOTE    Krista Doyle  AOZ:[id number] DOB: 1960/03/30 DOA: 11/29/2019 PCP: Krista Fire, MD    Brief Narrative:  Krista Doyle  is a 59 y.o. female, with past medical history of hypertension, hyperlipidemia, chemotherapy-induced neuropathy, diabetes mellitus, history of breast cancer visually diagnosed in 2008, status post radiation therapy and chemotherapy,  with recent diagnosis of metastatic disease to liver and osseous metastasis, patient was sent from oncology office for hypercalcemia, patient was recently diagnosed with lumbar/thoracic spine osseous metastasis, liver metastasis as well, patient work-up was significant for hypercalcemia at oncology office yesterday, where she received IV fluids and IV Zometa, today as well she received IV fluids and calcitonin in oncology office and sent to ED for further management, husband reports patient has more altered and confused over last 24 hours, as well has been weaker than her baseline, she has been reporting headache for 3 weeks, and has poor oral appetite, and oral intake, she has chronic nausea, but no vomiting. - in ED her calcium came<15, elevated at 1.3, baseline 0.8, potassium low at 3.3, MRI brain significant for 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   Assessment & Plan:   Active Problems:   Breast cancer of upper-outer quadrant of left female breast ([place])   Lumbar radiculopathy   Chemotherapy-induced peripheral neuropathy (HCC)   Metastatic breast cancer (HCC)   Hypercalcemia   Hypercalcemia -Patient admitted with severe hypercalcemia greater than 15 in the setting of metastatic breast cancer -She received Zometa prior to admission -She was also started on calcitonin -She was aggressively hydrated with IV fluids -Overall serum calcium has normalized -PTH, PTH related peptide in process  Metastatic breast  cancer -Evidence of metastases to liver, bone, lung -Continue follow-up with oncology -MRI brain showed evidence of skull metastases.  Discussed with oncology and felt to be unlikely related to leptomeningeal spread or intracranial spread.  No midline shift or mass-effect on imaging  Acute metabolic encephalopathy -Related to hypercalcemia -Mental status improving and appears to be back to baseline  Hypokalemia -Replace  Acute kidney injury -Creatinine of 0.8 at baseline -Peak creatinine up to 1.6 -This is since trended down with IV fluids  Insulin-dependent diabetes.   -On Lantus and sliding scale insulin -Continue to follow blood sugars  Chronic pain syndrome secondary to underlying metastatic breast cancer -Currently on fentanyl patch and Dilaudid as needed -Reminded her that Dilaudid was ordered every 4 hours as needed -Continue with Lyrica and Robaxin  Opioid-induced constipation -Continue with Movantik  Sinus tachycardia -Heart rate persistently in the 110s to 120s -D-dimer is elevated -CT angiogram of the chest performed was negative for pulmonary embolus -It did demonstrate widespread metastatic disease -Echocardiogram is also been ordered -Suspect that her tachycardia is related to pain -Continue pain management  Odynophagia -No obvious oral thrush -Patient reports onset of symptoms after sneezing -Has difficulty swallowing food now due to pain -CT scan of the neck ordered to evaluate for the pathology -No obvious abscess or infection noted -It did comment on possible pending pathologic fracture of the cervical spine as well as paravertebral inflammatory changes most prominent at the atlantoaxial junction with small retropharyngeal effusion -Recommendations are for MRI C-spine, this will be performed in a.m.  DVT prophylaxis: heparin injection 5,000 Units Start: 11/30/19 0600 SCDs Start: 11/30/19 0005  Code Status: Full code Family Communication: Discussed  with husband at the bedside and son over the phone Disposition Plan: Status is:  Inpatient  Remains inpatient appropriate because:Hemodynamically unstable   Dispo: The patient is from: Home              Anticipated d/c is to: TBD              Anticipated d/c date is: 2 days              Patient currently is not medically stable to d/c.    Consultants:     Procedures:     Antimicrobials:       Subjective: Reports continued pain, 7 out of 10, no shortness of breath, nausea or vomiting.  Notes indicate that she said she did not want her p.m. dose of pain medication at approximately 11 PM last night.  Patient does not have any memory of refusing her pain medicine last night.  Objective: Vitals:   12/01/19 0900 12/01/19 1300 12/01/19 1640 12/01/19 1900  BP:   (!) 162/92 (!) 175/70  Pulse: (!) 119  (!) 114 (!) 113  Resp: (!) 21  16 (!) 21  Temp:  98.6 F (37 C) 99.2 F (37.3 C)   TempSrc:  Oral Oral   SpO2: 100%  100% 99%  Weight:        Intake/Output Summary (Last 24 hours) at 12/01/2019 1959 Last data filed at 12/01/2019 1700 Gross per 24 hour  Intake 2710.04 ml  Output 1650 ml  Net 1060.04 ml   Filed Weights   11/30/19 0036 11/30/19 0444 12/01/19 0430  Weight: 51.2 kg 53.2 kg 51.8 kg    Examination:  General exam: Alert, awake, oriented x 3 Respiratory system: Clear to auscultation. Respiratory effort normal. Cardiovascular system: Tachycardic, Regular. No murmurs, rubs, gallops. Gastrointestinal system: Abdomen is nondistended, soft and nontender. No organomegaly or masses felt. Normal bowel sounds heard. Central nervous system: Alert and oriented. No focal neurological deficits. Extremities: No C/C/E, +pedal pulses Skin: No rashes, lesions or ulcers Psychiatry: Judgement and insight appear normal. Mood & affect appropriate.    Data Reviewed: I have personally reviewed following labs and imaging studies  CBC: Recent Labs  Lab 11/28/19 1302  11/29/19 1437 11/30/19 0457 12/01/19 0845  WBC 9.5 6.3 5.8 9.6  NEUTROABS 5.5 5.5  --   --   HGB 10.7* 10.0* 8.6* 8.7*  HCT 34.3* 33.1* 28.7* 27.9*  MCV 83.3 85.3 85.9 82.5  PLT 449* 367 317 017   Basic Metabolic Panel: Recent Labs  Lab 11/30/19 0045 11/30/19 0457 11/30/19 0835 11/30/19 1227 12/01/19 0845  NA 139 138 135 136 133*  K 2.6* 2.9* 3.3* 3.2* 3.1*  CL 110 106 106 106 102  CO2 20* 23 19* 21* 22  GLUCOSE 82 90 84 120* 82  BUN 13 13 13 12 10   CREATININE 0.73 0.77 0.78 0.72 0.62  CALCIUM 9.1 9.8 9.7 9.5 8.8*   GFR: Estimated Creatinine Clearance: 60.4 mL/min (by C-G formula based on SCr of 0.62 mg/dL). Liver Function Tests: Recent Labs  Lab 11/28/19 1302 11/29/19 1054 11/30/19 0457 12/01/19 0845  AST 53* 49* 39 49*  ALT 15 15 14 16   ALKPHOS 187* 172* 140* 162*  BILITOT 0.4 0.6 0.5 0.3  PROT 9.2* 8.6* 7.3 8.0  ALBUMIN 3.2* 3.1* 2.5* 2.7*   No results for input(s): LIPASE, AMYLASE in the last 168 hours. No results for input(s): AMMONIA in the last 168 hours. Coagulation Profile: Recent Labs  Lab 11/29/19 1437 11/30/19 0457  INR 1.1 1.3*   Cardiac Enzymes: No results for input(s): CKTOTAL, CKMB,  CKMBINDEX, TROPONINI in the last 168 hours. BNP (last 3 results) No results for input(s): PROBNP in the last 8760 hours. HbA1C: Recent Labs    11/29/19 1444  HGBA1C 6.7*   CBG: Recent Labs  Lab 11/30/19 1614 11/30/19 1953 12/01/19 0748 12/01/19 1156 12/01/19 1607  GLUCAP 119* 110* 64* 85 75   Lipid Profile: No results for input(s): CHOL, HDL, LDLCALC, TRIG, CHOLHDL, LDLDIRECT in the last 72 hours. Thyroid Function Tests: Recent Labs    11/29/19 1629 12/01/19 0845  TSH 0.405  --   FREET4  --  0.62   Anemia Panel: No results for input(s): VITAMINB12, FOLATE, FERRITIN, TIBC, IRON, RETICCTPCT in the last 72 hours. Sepsis Labs: Recent Labs  Lab 11/29/19 1437 11/29/19 1629  LATICACIDVEN 1.6 1.6    Recent Results (from the past 240  hour(s))  Respiratory Panel by RT PCR (Flu A&B, Covid) - Nasopharyngeal Swab     Status: None   Collection Time: 11/29/19  2:05 PM   Specimen: Nasopharyngeal Swab  Result Value Ref Range Status   SARS Coronavirus 2 by RT PCR NEGATIVE NEGATIVE Final    Comment: (NOTE) SARS-CoV-2 target nucleic acids are NOT DETECTED.  The SARS-CoV-2 RNA is generally detectable in upper respiratoy specimens during the acute phase of infection. The lowest concentration of SARS-CoV-2 viral copies this assay can detect is 131 copies/mL. A negative result does not preclude SARS-Cov-2 infection and should not be used as the sole basis for treatment or other patient management decisions. A negative result may occur with  improper specimen collection/handling, submission of specimen other than nasopharyngeal swab, presence of viral mutation(s) within the areas targeted by this assay, and inadequate number of viral copies (<131 copies/mL). A negative result must be combined with clinical observations, patient history, and epidemiological information. The expected result is Negative.  Fact Sheet for Patients:  PinkCheek.be  Fact Sheet for Healthcare Providers:  GravelBags.it  This test is no t yet approved or cleared by the [place] FDA and  has been authorized for detection and/or diagnosis of SARS-CoV-2 by FDA under an Emergency Use Authorization (EUA). This EUA will remain  in effect (meaning this test can be used) for the duration of the COVID-19 declaration under Section 564(b)(1) of the Act, 21 U.S.C. section 360bbb-3(b)(1), unless the authorization is terminated or revoked sooner.     Influenza A by PCR NEGATIVE NEGATIVE Final   Influenza B by PCR NEGATIVE NEGATIVE Final    Comment: (NOTE) The Xpert Xpress SARS-CoV-2/FLU/RSV assay is intended as an aid in  the diagnosis of influenza from Nasopharyngeal swab specimens and  should not  be used as a sole basis for treatment. Nasal washings and  aspirates are unacceptable for Xpert Xpress SARS-CoV-2/FLU/RSV  testing.  Fact Sheet for Patients: PinkCheek.be  Fact Sheet for Healthcare Providers: GravelBags.it  This test is not yet approved or cleared by the [place] FDA and  has been authorized for detection and/or diagnosis of SARS-CoV-2 by  FDA under an Emergency Use Authorization (EUA). This EUA will remain  in effect (meaning this test can be used) for the duration of the  Covid-19 declaration under Section 564(b)(1) of the Act, 21  U.S.C. section 360bbb-3(b)(1), unless the authorization is  terminated or revoked. Performed at [facility], [address], [place], [place] [address]   Blood culture (routine single)     Status: None (Preliminary result)   Collection Time: 11/29/19  2:45 PM   Specimen: BLOOD  Result Value Ref Range  Status   Specimen Description BLOOD  Final   Special Requests NONE  Final   Culture   Final    NO GROWTH 2 DAYS Performed at [facility], [address] [place], [place] [address]    Report Status PENDING  Incomplete  Urine culture     Status: None   Collection Time: 11/29/19  3:50 PM   Specimen: In/Out Cath Urine  Result Value Ref Range Status   Specimen Description   Final    IN/OUT CATH URINE Performed at [facility], [address], [place], [place] [address]    Special Requests   Final    NONE Performed at [facility], [address] [place], [place] [address]    Culture   Final    NO GROWTH Performed at [facility], [place] [address] [place], [place] [address]    Report Status 12/01/2019 FINAL  Final  MRSA PCR Screening     Status: Abnormal   Collection Time: 11/30/19 12:04 AM   Specimen: Nasopharyngeal  Result Value Ref Range Status   MRSA by PCR POSITIVE (A) NEGATIVE Final    Comment:        The GeneXpert MRSA Assay (FDA approved for NASAL  specimens only), is one component of a comprehensive MRSA colonization surveillance program. It is not intended to diagnose MRSA infection nor to guide or monitor treatment for MRSA infections. RESULT CALLED TO, READ BACK BY AND VERIFIED WITH: ALSTON @ [id number] ON [id number] BY HENDERSON L. Performed at [facility], [address], [place], [place] [address]          Radiology Studies: CT SOFT TISSUE NECK W CONTRAST  Result Date: 12/01/2019 CLINICAL DATA:  Epiglottitis or tonsillitis suspected.  Neck pain. EXAM: CT NECK WITH CONTRAST TECHNIQUE: Multidetector CT imaging of the neck was performed using the standard protocol following the bolus administration of intravenous contrast. CONTRAST:  56mL OMNIPAQUE IOHEXOL 350 MG/ML SOLN COMPARISON:  11/29/2019 CT and MRI head.  Concurrent CTA chest. FINDINGS: Pharynx and larynx: Clear nasopharynx. Normal appearance of the epiglottis. Partial effacement of the left vallecula and piriform sinus, likely secondary to neck positioning. The vallecula and piriform sinuses are otherwise unremarkable. No vocal cord paralysis. Small retropharyngeal effusion spanning the C2-C5 levels. Salivary glands: No inflammation, mass, or stone. Thyroid: Normal. Lymph nodes: Diffusely prominent subcentimeter cervical nodes. Vascular: Normal intravascular enhancement. Limited intracranial: Please see recent CT and MRI head. Visualized orbits: Normal orbits. Mastoids and visualized paranasal sinuses: Pneumatized. Skeleton: Diffuse heterogeneity of the bone marrow predominantly involving the cervical spine at the C1-2 and C6-T1 levels. There is multilevel involvement of the posterior elements. Vertebral body heights are preserved. Perivertebral inflammatory changes most prominent at the C2 level. Please see recent CT and MRI head for better evaluation of dominant clival and multifocal calvarial osseous metastases. Redemonstration of anterior left TMJ metastasis. Upper chest: Partially  imaged pulmonary metastases. Please see concurrent CTA chest for additional findings below the thoracic inlet. Other: None. IMPRESSION: No evidence of epiglottitis or tonsillitis. Diffuse osseous metastases with prominent involvement of the cervical spine. Vertebral body heights are preserved however cannot exclude pending pathologic fracture. Perivertebral inflammatory changes most prominent at the atlantoaxial junction with small retropharyngeal effusion. Cannot exclude metastatic soft tissue involvement. MRI cervical spine is recommended for better evaluation. Partially imaged pulmonary metastases. Please see concurrent CTA chest for additional findings below the thoracic inlet. These results will be called to the ordering clinician or representative by the Radiologist Assistant, and  communication documented in the PACS or [employer]. Electronically Signed   By: Primitivo Gauze M.D.   On: 12/01/2019 19:30   CT ANGIO CHEST PE W OR WO CONTRAST  Result Date: 12/01/2019 CLINICAL DATA:  PE suspected, low/intermediate prob, positive D-dimer History of metastatic breast cancer. EXAM: CT ANGIOGRAPHY CHEST WITH CONTRAST TECHNIQUE: Multidetector CT imaging of the chest was performed using the standard protocol during bolus administration of intravenous contrast. Multiplanar CT image reconstructions and MIPs were obtained to evaluate the vascular anatomy. CONTRAST:  86mL OMNIPAQUE IOHEXOL 350 MG/ML SOLN COMPARISON:  Chest CT 11/12/2019, thoracic spine MRI 11/18/2019 FINDINGS: Cardiovascular: There are no filling defects within the pulmonary arteries to suggest pulmonary embolus. Left hilar lymph node is enlarging in causing increasing mass effect on lingular pulmonary artery but no evidence of invasion. Common origin of brachiocephalic and left common carotid artery, variant arch anatomy. Extensive mural thickening with severe luminal narrowing involving the proximal left subclavian artery is unchanged from  recent exam. There is no aortic dissection or acute aortic findings. The heart is normal in size. No pericardial effusion. Mediastinum/Nodes: Left hilar lymph node has slightly increased in size currently measuring 18 x 15 mm, previously 16 x 14 mm. Soft tissue mass in the epicardial fat along the cardiac apex measures 3.3 x 2.1 cm (series 5, image 68), previously 2.7 x 2.0 cm. No esophageal wall thickening. Previous left thyroid nodule is not well-defined on the current exam due to streak artifact in the right subclavian vein from dense IV contrast. Surgical clips in the left axilla. Previous small right axillary nodes are partially obscured by dense IV contrast. Lungs/Pleura: Extensive bilateral pulmonary nodules are again seen, both random and pleural/subpleural. Majority of these are stable from recent prior, however largest nodule currently measures 2.2 x 1.2 cm, series 7, image 51, previously 1.9 x 1.1 cm. A small right pleural effusion is new, with adjacent compressive atelectasis. No septal thickening or findings of pulmonary edema. Upper Abdomen: Known liver lesions on prior exam are less well-defined given phase of IV contrast. Musculoskeletal: Diffuse osseous metastatic disease throughout the thoracic osseous structures lateral left breast soft tissue nodularity measures 15 x 12 mm, series 5, image 49. With lytic and blastic osseous lesions. Callus formation about right anterior ribs fractures, also seen on previous. Review of the MIP images confirms the above findings. IMPRESSION: 1. No pulmonary embolus. 2. New small right pleural effusion with adjacent compressive atelectasis. 3. Extensive bilateral pulmonary nodules consistent with metastatic disease. Majority of these are stable from recent prior, however largest pulmonary nodule has increased in size from prior. 4. Left hilar lymph node has slightly increased in size from recent prior. Soft tissue mass in the epicardial fat along the left cardiac  apex has increased in size. 5. Diffuse osseous metastatic disease. 6. Left breast soft tissue nodularity laterally again seen. 7. Known liver lesions on prior exam are less well-defined on the current exam given phase of IV contrast. 8. Extensive mural thickening with severe luminal narrowing involving the proximal left subclavian artery is unchanged from recent prior. Electronically Signed   By: Keith Rake M.D.   On: 12/01/2019 19:20        Scheduled Meds: . amitriptyline  20 mg Oral QHS  . Chlorhexidine Gluconate Cloth  6 each Topical Daily  . feeding supplement  1 Container Oral TID BM  . [START ON 12/02/2019] fentaNYL  1 patch Transdermal Q72H  . heparin  5,000 Units Subcutaneous Q8H  .  insulin aspart  0-5 Units Subcutaneous QHS  . insulin aspart  0-9 Units Subcutaneous TID WC  . insulin glargine  15 Units Subcutaneous QHS  . letrozole  2.5 mg Oral Daily  . magic mouthwash w/lidocaine  15 mL Oral TID  . megestrol  20 mg Oral BID  . mometasone-formoterol  2 puff Inhalation BID  . mupirocin ointment   Nasal BID  . naloxegol oxalate  25 mg Oral Daily  . pregabalin  100 mg Oral BID  . senna  8.6 mg Oral Daily   Continuous Infusions:    LOS: 2 days    Time spent: 67mins    Kathie Dike, MD [facility]   If 7PM-7AM, please contact night-coverage [email or web address]  12/01/2019, 7:59 PM

## 2019-12-01 NOTE — Patient Instructions (Addendum)
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  You will be treated in the clinic every 3 weeks with a combination of chemotherapy and immunotherapy drugs.  Those drugs are docetaxel (Taxotere), trastuzumab (Herceptin), and pertuzumab (Perjeta).  The goal of treatment is to control your cancer, keep it from spreading further, and to alleviate any symptoms you may be having related to your disease.  You will see the doctor regularly throughout treatment.  We will obtain blood work from you prior to every treatment and monitor your results to make sure it is safe to give your treatment. The doctor monitors your response to treatment by the way you are feeling, your blood work, and by obtaining scans periodically.  There will be wait times while you are here for treatment.  It will take about 30 minutes to 1 hour for your lab work to result.  Then there will be wait times while pharmacy mixes your medications.   Medications you will receive in the clinic prior to your chemotherapy medications:  Zofran:  Zofran is used in adults to help prevent the nausea and vomiting that happens with certain chemotherapy drugs.   Dexamethasone:  This is a steroid given prior to chemotherapy to help prevent allergic reactions; it may also help prevent and control nausea and diarrhea.   Benadryl:  This is a histamine blocker (different from the Pepcid) that helps prevent allergic/infusion reactions to your chemotherapy. This medication may cause dizziness/drowsiness.  Tylenol:  Given prior to immunotherapy infusions to prevent infusion reactions such as fever/chills.   Docetaxel (Taxotere)  About This Drug  Docetaxel is used to treat cancer. It is given in the vein (IV) through your port a cath.  It will take 1 hour to infuse. Your first infusion will take longer than 1 hour due to the fact that we start it at a very slow rate and  gradually increase the rate until the maximum infusion rate is reached.  This is done in order to monitor you closely for allergic/infusion reactions.  Your nurse will remain in the room with you for the first 15 minutes of this infusion on your first time getting it.  If you tolerate the first infusion without adverse reactions, going forward we will give you this drug at the normal infusion rate over 1 hour.   Possible Side Effects . Bone marrow suppression. This is a decrease in the number of white blood cells, red blood cells, and platelets. This may raise your risk of infection, make you tired and weak (fatigue), and raise your risk of bleeding.  . Fever in the setting of decreased white blood cells, which is a serious condition that can be lifethreatening  . Soreness of the mouth and throat. You may have red areas, white patches, or sores that hurt.  . Nausea and vomiting (throwing up)  . Constipation (not able to move bowels)  . Diarrhea (loose bowel movements)  . Infections  . Swelling of your legs, ankles and/or feet, or fluid build-up around your lungs, heart or elsewhere   . Changes in the way food and drinks taste  . Effects on the nerves are called peripheral neuropathy. You may feel numbness, tingling, or pain in your hands and feet. It may be hard for you to button your clothes, open jars, or walk as usual. The effect on the nerves may get worse with more doses of the drug. These  effects get better in some people after the drug is stopped but it does not get better in all people.  . Decreased appetite (decreased hunger)  . Weakness  . Pain  . Muscle pain/aching  . Trouble breathing  . Changes in your nail color, you may have nail loss and/or brittle nail   . Hair loss. Hair loss is often temporary, although there have been cases of permanent hair loss reported. Hair loss may happen suddenly or gradually. If you lose hair, you may lose it from your head, face,  armpits, pubic area, chest, and/or legs. You may also notice your hair getting thin.  . Allergic skin reaction. You may develop blisters on your skin that are filled with fluid or a severe red rash all over your body that may be painful.  . Allergic reactions, including anaphylaxis are rare but may happen in some patients. Signs of allergic reaction to this drug may be swelling of the face, feeling like your tongue or throat are swelling, trouble breathing, rash, itching, fever, chills, feeling dizzy, and/or feeling that your heart is beating in a fast or not normal way. If this happens, do not take another dose of this drug. You should get urgent medical treatment.  Note: Not all possible side effects are included above.  Warnings and Precautions . Severe bone marrow suppression, including febrile neutropenia - fever in the setting of decreased white blood cells, which may be life threatening.  . Severe allergic reactions, including anaphylaxis which can be life-threatening  . Swelling (inflammation) in the colon in the setting of severely low white blood cells, which raises your risk of infection and can be life-threatening  . Severe skin reactions, including redness, swelling or peeling of skin  . Severe swelling in the eye or other changes in eyesight  . Severe swelling of your legs, ankles and/or feet. Sometimes, fluid can build up in your lungs and/or around your heart causing you trouble breathing.  . If you have a history of abnormal liver function, receive high doses of docetaxel, or have a history of lung cancer and have received treatment with a platinum (type of chemotherapy medication), you have an increased risk of death.  . Severe weakness  . This drug may raise your risk of getting a second cancer such as leukemia and myelodysplastic syndrome.  . Severe peripheral neuropathy - numbness, tingling, or pain in your hands and feet . This drug contains alcohol and may affect  your central nervous system. The central nervous system is made up of your brain and spinal cord. You may feel drunk during and after your treatment and it can impair your ability to drive or use machinery for one to two hours after infusion.   . Tumor lysis syndrome: This drug may act on the cancer cells very quickly. This may affect how your kidneys work.  Note: Some of the side effects above are very rare. If you have concerns and/or questions, please discuss them with your medical team.  Important Information . This drug may be present in the saliva, tears, sweat, urine, stool, vomit, semen, and vaginal secretions. Talk to your doctor and/or your nurse about the necessary precautions to take during this time.  Treating Side Effects . Manage tiredness by pacing your activities for the day.  . Be sure to include periods of rest between energy-draining activities.  . Get regular exercise. If you feel too tired to exercise vigorously, try taking a short walk.  . To  decrease the risk of infection, wash your hands regularly.  . Avoid close contact with people who have a cold, the flu, or other infections.  . Take your temperature as your doctor or nurse tells you, and whenever you feel like you may have a fever.  . To help decrease the risk of bleeding, use a soft toothbrush. Check with your nurse before using dental floss.  . Be very careful when using knives or tools.  . Use an electric shaver instead of a razor.  . Mouth care is very important and will help food taste better and improve your appetite. Your mouth care should consist of routine, gentle cleaning of your teeth or dentures and rinsing your mouth with a mixture of 1/2 teaspoon of salt in 8 ounces of water or 1/2 teaspoon of baking soda in 8 ounces of water. This should be done at least after each meal and at bedtime.  . If you have mouth sores, avoid mouthwash that has alcohol. Also avoid alcohol and smoking because they can  bother your mouth and throat.  . Ask your doctor or nurse about medicines that are available to help stop or lessen constipation and/or diarrhea.  . If you are not able to move your bowels, check with your doctor or nurse before you use enemas, laxatives, or suppositories.  . Drink plenty of fluids (a minimum of eight glasses per day - 64 oz -  is recommended).  . If you throw up or have loose bowel movements, you should drink more fluids so that you do not become dehydrated (lack of water in the body from losing too much fluid).  . If you have diarrhea, eat low-fiber foods that are high in protein and calories and avoid foods that can irritate your digestive tracts or lead to cramping.  . To help with nausea and vomiting, eat small, frequent meals instead of three large meals a day. Choose foods and drinks that are at room temperature. Ask your nurse or doctor about other helpful tips and medicine that is available to help stop or lessen these symptoms.  . To help with decreased appetite, eat foods high in calories and protein, such as meat, poultry, fish, dry beans, tofu, eggs, nuts, milk, yogurt, cheese, ice cream, pudding, and nutritional supplements.  . Consider using sauces and spices to increase taste. Daily exercise, with your doctor's approval, may increase your appetite.  Marland Kitchen Keeping your pain under control is important to your well-being. Please tell your doctor or nurse if you are experiencing pain.  . If you get a rash do not put anything on it unless your doctor or nurse says you may. Keep the area around the rash clean and dry. Ask your doctor for medicine if your rash bothers you.  Marland Kitchen Keeping your nails moisturized may help with brittleness.  . To help with hair loss, wash with a mild shampoo and avoid washing your hair every day.  . Avoid rubbing your scalp, pat your hair or scalp dry.  . Avoid coloring your hair.  . Limit your use of hair spray, electric curlers, blow  dryers, and curling irons.  . If you are interested in getting a wig, talk to your nurse. You can also call the [facility] at 800-ACS-2345 to find out information about the "Look Good, Feel Better" program close to where you live. It is a free program where women getting chemotherapy can learn about wigs, turbans and scarves as well as makeup  techniques and skin and nail care.  . If you have numbness and tingling in your hands and feet, be careful when cooking, walking, and handling sharp objects and hot liquids.  Food and Drug Interactions . There are no known interactions of docetaxel with food.  . This drug may interact with other medicines. Tell your doctor and pharmacist about all the prescription and over-the-counter medicines and dietary supplements (vitamins, minerals, herbs and others) that you are taking at this time. Also, check with your doctor or pharmacist before starting any new prescription or over-the-counter medicines, or dietary supplements to make sure that there are no interactions.  When to Call the Doctor Call your doctor or nurse if you have any of these symptoms and/or any new or unusual symptoms:  . Fever of 100.4 F (38 C) or higher  . Chills  . Blurred vision or other changes in eyesight  . Easy bruising or bleeding  . Wheezing or trouble breathing  . Chest pain  . Feeling dizzy or lightheaded  . Tiredness that interferes with your daily activities  . Pain in your mouth or throat that makes it hard to eat or drink  . Nausea that stops you from eating or drinking and/or is not relieved by prescribed medicines  . Throwing up  . Lasting loss of appetite or rapid weight loss of five pounds in a week  . Diarrhea, 4 times in one day or diarrhea with lack of strength or a feeling of being dizzy  . No bowel movement in 3 days or when you feel uncomfortable  . Severe abdominal pain that does not go away  . Blood in your stool  . Numbness,  tingling, or pain in your hands and feet  . Swelling of legs, ankles, or feet  . Weight gain of 5 pounds in one week (fluid retention)  . Extreme weakness that interferes with normal activities  . New rash and/or itching  . Rash that is not relieved by prescribed medicines  . Signs of inflammation/infection (redness, swelling, pain) of the tissue around your nails.  . Signs of allergic reaction: swelling of the face, feeling like your tongue or throat are swelling, trouble breathing, rash, itching, fever, chills, feeling dizzy, and/or feeling that your heart is beating in a fast or not normal way. If this happens, call 911 for emergency care.  . Flu-like symptoms: fever, headache, muscle and joint aches, and fatigue (low energy, feeling weak)  . Signs of possible liver problems: dark urine, pale bowel movements, bad stomach pain, feeling very tired and weak, unusual itching, or yellowing of the eyes or skin  . Symptoms of being drunk, confusion, or being very sleepy  . Confusion or agitation, decreased urine, nausea/vomiting, diarrhea, muscle cramping, numbness and/or tingling, seizures  . General pain that does not go away or is not relieved by prescribed medicine  . If you think you may be pregnant or have impregnated your partner  Reproduction Warnings . Pregnancy warning: This drug can have harmful effects on the unborn baby. Women of childbearing potential should use effective methods of birth control during your cancer treatment and for 6 months after treatment. Men with female partners of childbearing potential should use effective methods of birth control during your cancer treatment and for 3 months after your cancer treatment. Let your doctor know right away if you think you may be pregnant or may have impregnated your partner.  . Breastfeeding warning: Women should not breastfeed during treatment and  for 1 week after treatment because this drug could enter the breast milk  and cause harm to a breastfeeding baby.   . Fertility warning: In men, this drug may affect your ability to have children in the future. Talk with your doctor or nurse if you plan to have children. Ask for information on sperm banking.  Pertuzumab (Perjeta)  About This Drug Pertuzumab is used to treat cancer. It is given in the vein (IV) through your port a cath.  The first infusion will be given over 1 hour.  Subsequent infusions will be given over 30 minutes.   Possible Side Effects . Bone marrow suppression. This is a decrease in the number of white blood cells, red blood cells, and platelets. This may raise your risk of infection, make you tired and weak (fatigue), and raise your risk of bleeding.  . Nausea and vomiting (throwing up)  . Diarrhea (loose bowel movements)  . Not able to move bowels (constipation)  . Tiredness  . Headache  . Muscle pain/aching  . Effects on the nerves are called peripheral neuropathy. You may feel numbness, tingling, or pain in your hands and feet. It may be hard for you to button your clothes, open jars, or walk as usual. The effect on the nerves may get worse with more doses of the drug. These effects get better in some people after the drug is stopped but it does not get better in all people.  . Rash  . Hair loss. Hair loss is often temporary, although with certain medicine, hair loss can sometimes be permanent. Hair loss may happen suddenly or gradually. If you lose hair, you may lose it from your head, face, armpits, pubic area, chest, and/or legs. You may also notice your hair getting thin.  Note: Each of the side effects above was reported in 30% or greater of patients treated with pertuzumab. Not all possible side effects are included above.  Warnings and Precautions . Congestive heart failure - your heart has less ability to pump blood properly. You may be short of breath. Your arms, hands, legs and feet may swell.   . Allergic reactions,  including anaphylaxis are rare but may happen in some patients. Signs of allergic reaction to this drug may be swelling of the face, feeling like your tongue or throat are swelling, trouble breathing, rash, itching, fever, chills, feeling dizzy, and/or feeling that your heart is beating in a fast or not normal way. If this happens, do not take another dose of this drug. You should get urgent medical treatment.  . While you are getting this drug in your vein (IV), you may have a reaction to the drug. Sometimes you may be given medication to stop or lessen these side effects. Your nurse will check you closely for these signs: fever or shaking chills, flushing, facial swelling, feeling dizzy, headache, trouble breathing, rash, itching, chest tightness, or chest pain. These reactions may happen after your infusion. If this happens, call 911 for emergency care.  Note: Some of the side effects above are very rare. If you have concerns and/or questions, please discuss them with your medical team.  Important Information . This drug may be present in the saliva, tears, sweat, urine, stool, vomit, semen, and vaginal secretions. Talk to your doctor and/or your nurse about the necessary precautions to take during this time.  Treating Side Effects . Manage tiredness by pacing your activities for the day.  . Be sure to include periods of rest  between energy-draining activities.  . To decrease the risk of infection, wash your hands regularly.  . Avoid close contact with people who have a cold, the flu, or other infections.  . Take your temperature as your doctor or nurse tells you, and whenever you feel like you may have a fever.  . To help decrease the risk of bleeding, use a soft toothbrush. Check with your nurse before using dental floss.  . Be very careful when using knives or tools.  . Use an electric shaver instead of a razor.  . Drink plenty of fluids (a minimum of eight glasses per day is  recommended).  . To help with nausea and vomiting, eat small, frequent meals instead of three large meals a day. Choose foods and drinks that are at room temperature. Ask your nurse or doctor about other helpful tips and medicine that is available to help stop or lessen these symptoms.  . If you throw up or have loose bowel movements, you should drink more fluids so that you do not become dehydrated (lack of water in the body from losing too much fluid).  . If you have diarrhea, eat low-fiber foods that are high in protein and calories and avoid foods that can irritate your digestive tracts or lead to cramping.  . Ask your nurse or doctor about medicine that can lessen or stop your diarrhea or constipation.  . If you are not able to move your bowels, check with your doctor or nurse before you use enemas, laxatives, or suppositories.  . If you get a rash do not put anything on it unless your doctor or nurse says you may. Keep the area around the rash clean and dry. Ask your doctor for medicine if your rash bothers you.  . If you have numbness and tingling in your hands and feet, be careful when cooking, walking, and handling sharp objects and hot liquids.  . Infusion reactions may occur after your infusion. If this happens, call 911 for emergency care.  . To help with hair loss, wash with a mild shampoo and avoid washing your hair every day.  . Avoid rubbing your scalp, pat your hair or scalp dry.  . Avoid coloring your hair.  . Limit your use of hair spray, electric curlers, blow dryers, and curling irons.  . If you are interested in getting a wig, talk to your nurse. You can also call the [facility] at 800-ACS-2345 to find out information about the "Look Good, Feel Better" program close to where you live. It is a free program where women getting chemotherapy can learn about wigs, turbans and scarves as well as makeup techniques and skin and nail care.  Marland Kitchen Keeping your pain  under control is important to your well-being. Please tell your doctor or nurse if you are experiencing pain.  . Get regular exercise. If you feel too tired to exercise vigorously, try taking a short walk.  Food and Drug Interactions . There are no known interactions of pertuzumab with food.  . This drug may interact with other medicines. Tell your doctor and pharmacist about all the prescription and over-the-counter medicines and dietary supplements (vitamins, minerals, herbs and others) that you are taking at this time. Also, check with your doctor or pharmacist before starting any new prescription or over-the-counter medicines, or dietary supplements to make sure that there are no interactions.  When to Call the Doctor Call your doctor or nurse if you have any of these  symptoms and/or any new or unusual symptoms:  . Fever of 100.4 F (38 C) or higher  . Chills  . Trouble breathing  . Tiredness or weakness that interferes with your daily activities  . Feeling dizzy or lightheaded  . Easy bleeding or bruising  . Swelling of arms, legs, ankles, or feet  . Weight gain of 5 pounds in one week (fluid retention)  . Headache that does not go away  . Nausea that stops you from eating or drinking and/or is not relieved by prescribed medicines  . Throwing up  . Diarrhea, 4 times in one day or diarrhea with lack of strength or a feeling of being dizzy  . No bowel movement in 3 days or when you feel uncomfortable  . A new rash or a rash that is not relieved by prescribed medicines  . Numbness, tingling, or pain in your hands and feet  . Signs of allergic reaction: swelling of the face, feeling like your tongue or throat are swelling, trouble breathing, rash, itching, fever, chills, feeling dizzy, and/or feeling that your heart is beating in a fast or not normal way. If this happens, call 911 for emergency care.  . Signs of infusion reaction: fever or shaking chills, flushing,  facial swelling, feeling dizzy, headache, trouble breathing, rash, itching, chest tightness, or chest pain. If this happens, call 911 for emergency care.  . If you think you may be pregnant  Reproduction Warnings . Pregnancy warning: This drug can have harmful effects on the unborn baby. Women of childbearing potential should use effective methods of birth control during your cancer treatment and for 7 months after treatment. Let your doctor know right away if you think you may be pregnant.  . Breastfeeding warning: It is not known if this drug passes into breast milk. For this reason, women should not breastfeed during treatment and for 7 months after treatment because this drug could enter the  breast milk and cause harm to a breastfeeding baby.  . Fertility warning: Human fertility studies have not been done with this drug. Talk with your doctor or nurse if you plan to have children. Ask for information on sperm or egg banking.   Trastuzumab-xxxx (Herceptin, [place], [place], [place], [place], [place])  About This Drug  Trastuzumab-xxxx is used to treat cancer. It is given in the vein (IV) through your port a cath.  The first infusion will be given over 90 minutes.  The second infusion will be given over 60 minutes.  The third infusion and all those after will be given over 30 minutes.   Possible Side Effects . Bone marrow suppression. This is a decrease in the number of white blood cells, red blood cells, and platelets. This may raise your risk of infection, make you tired and weak (fatigue), and raise your risk of bleeding.  . Congestive heart failure - your heart has less ability to pump blood properly  . Soreness of the mouth and throat. You may have red areas, white patches, or sores that hurt.  . Nausea  . Diarrhea (loose bowel movements)  . Fever  . Chills  . Tiredness  . Infection  . Inflammation of nasal passages and throat  . Changes in the way food and  drinks taste  . Weight loss  . Headache  . Trouble sleeping  . Cough  . Upper respiratory infection  . Rash  Note: Each of the side effects above was reported in 10% or greater of patients treated  with trastuzumab-xxxx. Not all possible side effects are included above.  Warnings and Precautions . Changes in the tissue of the heart and heart function. Some changes may happen that can cause your heart to have less ability to pump blood. This drug may also increase your risk of heart attack.   . Serious and life-threatening lung problems such as inflammation (swelling) and scarring of the lungs which makes breathing difficult.  . While you are getting this drug in your vein (IV), you may have a reaction to the drug. Sometimes you may be given medication to stop or lessen these side effects. Your nurse will check you closely for these signs: fever or shaking chills, flushing, facial swelling, feeling dizzy, headache, trouble breathing, rash, itching, chest tightness, or chest pain. These reactions may happen after your infusion. If this happens, call 911 for emergency care.  . Severe decrease in the number of white blood cells, especially when receiving this drug in combination with other chemotherapy. This may raise your risk of infection which may be lifethreatening.  Note: Some of the side effects above are very rare. If you have concerns and/or questions, please discuss them with your medical team.  Important Information . This drug may be present in the saliva, tears, sweat, urine, stool, vomit, semen, and vaginal secretions. Talk to your doctor and/or your nurse about the necessary precautions to take during this time.  Treating Side Effects . Manage tiredness by pacing your activities for the day.  . Be sure to include periods of rest between energy-draining activities.  . To decrease the risk of infection, wash your hands regularly.  . Avoid close contact with people who have  a cold, the flu, or other infections.  . Take your temperature as your doctor or nurse tells you, and whenever you feel like you may have a fever.  . To help decrease the risk of bleeding, use a soft toothbrush. Check with your nurse before using dental floss.  . Be very careful when using knives or tools.  . Use an electric shaver instead of a razor.  . Drink plenty of fluids (a minimum of eight glasses per day is recommended).  . To help with nausea, eat small, frequent meals instead of three large meals a day. Choose foods and drinks that are at room temperature. Ask your nurse or doctor about other helpful tips and medicine that is available to help stop or lessen these symptoms.  . Mouth care is very important. Your mouth care should consist of routine, gentle cleaning of your teeth or dentures and rinsing your mouth with a mixture of 1/2 teaspoon of salt in 8 ounces of water or 1/2 teaspoon of baking soda in 8 ounces of water. This should be done at least after each meal and at bedtime.  . Taking good care of your mouth may help food taste better and improve your appetite.  . If you have mouth sores, avoid mouthwash that has alcohol. Also avoid alcohol and smoking because they can bother your mouth and throat.  . If you throw up or have loose bowel movements, you should drink more fluids so that you do not become dehydrated (lack of water in the body from losing too much fluid).  . If you have diarrhea, eat low-fiber foods that are high in protein and calories and avoid foods that can irritate your digestive tracts or lead to cramping.  . Ask your nurse or doctor about medicine that can lessen or  stop your diarrhea.  . To help with weight loss, drink fluids that contribute calories (whole milk, juice, soft drinks, sweetened beverages, milkshakes, and nutritional supplements) instead of water.  . Include a source of protein at every meal and snack, such as meat, poultry, fish, dry  beans, tofu, eggs, nuts, milk, yogurt, cheese, ice cream, pudding, and nutritional supplements.  . If you get a rash do not put anything on it unless your doctor or nurse says you may. Keep the area around the rash clean and dry. Ask your doctor for medicine if your rash bothers you.  Marland Kitchen Keeping your pain under control is important to your well-being. Please tell your doctor or nurse if you are experiencing pain.  . If you are having trouble sleeping, talk to your nurse or doctor on tips to help you sleep better.  . Infusion reactions may occur after your infusion. If this happens, call 911 for emergency care.  Food and Drug Interactions . There are no known interactions of trastuzumab-xxxx with food.  . This drug may interact with other medicines. Tell your doctor and pharmacist about all the prescription and over-the-counter medicines and dietary supplements (vitamins, minerals, herbs and others) that you are taking at this time. Also, check with your doctor or pharmacist before starting any new prescription or over-the-counter medicines, or dietary supplements to make sure that there are no interactions.  When to Call the Doctor Call your doctor or nurse if you have any of these symptoms and/or any new or unusual symptoms:  . Fever of 100.4 F (38 C) or higher  . Chills  . Tiredness that interferes with your daily activities  . Trouble falling or staying asleep  . Feeling dizzy or lightheaded  . A headache that does not go away  . Easy bleeding or bruising  . Wheezing or trouble breathing or dry cough  . Coughing up yellow, green, or bloody mucus  . Feeling that your heart is beating in a fast or not normal way (palpitations)  . Chest pain or symptoms of a heart attack. Most heart attacks involve pain in the center of the chest that lasts more than a few minutes. The pain may go away and come back, or it can be constant. It can feel like pressure, squeezing, fullness, or  pain. Sometimes pain is felt in one or both arms, the back, neck, jaw, or stomach. If any of these symptoms last 2 minutes, call 911.  . Pain in your mouth or throat that makes it hard to eat or drink  . Nausea that stops you from eating or drinking and/or is not relieved by prescribed medicines  . Diarrhea, 4 times in one day or diarrhea with lack of strength or a feeling of being dizzy  . Lasting loss of appetite or rapid weight loss of five pounds in a week  . Swelling of arms, hand, legs, and/or feet  . Weight gain of 5 pounds in one week (fluid retention)  . A new rash and/or itching that is not relieved by prescribed medicines  . Signs of infusion reaction: fever or shaking chills, flushing, facial swelling, feeling dizzy, headache, trouble breathing, rash, itching, chest tightness, or chest pain. If this happens call 911 for emergency care.  . If you think you may be pregnant  Reproduction Warnings . Pregnancy warning: This drug can have harmful effects on the unborn baby. Women of childbearing potential should use effective methods of birth control during your cancer treatment  and for 7 months after treatment. Let your doctor know right away if you think you may be pregnant during treatment or within 7 months of receiving treatment.  . Breastfeeding warning: It is not known if this drug passes into breast milk. For this reason, women should talk to their doctor about the risks and benefits of breastfeeding during treatment with this drug and for 7 months after treatment because this drug may enter the breast milk and cause harm to a breastfeeding baby.  . Fertility warning: Human fertility studies have not been done with this drug. Talk with your doctor or nurse if you plan to have children. Ask for information on sperm or egg banking.   SELF CARE ACTIVITIES WHILE RECEIVING CHEMOTHERAPY:  Hydration Increase your fluid intake 48 hours prior to treatment and drink at least 8 to  12 cups (64 ounces) of water/decaffeinated beverages per day after treatment. You can still have your cup of coffee or soda but these beverages do not count as part of your 8 to 12 cups that you need to drink daily. No alcohol intake.  Medications Continue taking your normal prescription medication as prescribed.  If you start any new herbal or new supplements please let us know first to make sure it is safe.  Mouth Care Have teeth cleaned professionally before starting treatment. Keep dentures and partial plates clean. Use soft toothbrush and do not use mouthwashes that contain alcohol. Biotene is a good mouthwash that is available at most pharmacies or may be ordered by calling [phone]. Use warm salt water gargles (1 teaspoon salt per 1 quart warm water) before and after meals and at bedtime. If you need dental work, please let the doctor know before you go for your appointment so that we can coordinate the best possible time for you in regards to your chemo regimen. You need to also let your dentist know that you are actively taking chemo. We may need to do labs prior to your dental appointment.  Skin Care Always use sunscreen that has not expired and with SPF (Sun Protection Factor) of 50 or higher. Wear hats to protect your head from the sun. Remember to use sunscreen on your hands, ears, face, & feet.  Use good moisturizing lotions such as udder cream, eucerin, or even Vaseline. Some chemotherapies can cause dry skin, color changes in your skin and nails.    . Avoid long, hot showers or baths. . Use gentle, fragrance-free soaps and laundry detergent. . Use moisturizers, preferably creams or ointments rather than lotions because the thicker consistency is better at preventing skin dehydration. Apply the cream or ointment within 15 minutes of showering. Reapply moisturizer at night, and moisturize your hands every time after you wash them.  Hair Loss (if your doctor says your hair will fall  out)  . If your doctor says that your hair is likely to fall out, decide before you begin chemo whether you want to wear a wig. You may want to shop before treatment to match your hair color. . Hats, turbans, and scarves can also camouflage hair loss, although some people prefer to leave their heads uncovered. If you go bare-headed outdoors, be sure to use sunscreen on your scalp. . Cut your hair short. It eases the inconvenience of shedding lots of hair, but it also can reduce the emotional impact of watching your hair fall out. . Don't perm or color your hair during chemotherapy. Those chemical treatments are already damaging to hair  and can enhance hair loss. Once your chemo treatments are done and your hair has grown back, it's OK to resume dyeing or perming hair.  With chemotherapy, hair loss is almost always temporary. But when it grows back, it may be a different color or texture. In older adults who still had hair color before chemotherapy, the new growth may be completely gray.  Often, new hair is very fine and soft.  Infection Prevention Please wash your hands for at least 30 seconds using warm soapy water. Handwashing is the #1 way to prevent the spread of germs. Stay away from sick people or people who are getting over a cold. If you develop respiratory systems such as green/yellow mucus production or productive cough or persistent cough let us know and we will see if you need an antibiotic. It is a good idea to keep a pair of gloves on when going into grocery stores/[employer] to decrease your risk of coming into contact with germs on the carts, etc. Carry alcohol hand gel with you at all times and use it frequently if out in public. If your temperature reaches 100.5 or higher please call the clinic and let us know.  If it is after hours or on the weekend please go to the ER if your temperature is over 100.5.  Please have your own personal thermometer at home to use.    Sex and bodily  fluids If you are going to have sex, a condom must be used to protect the person that isn't taking chemotherapy. Chemo can decrease your libido (sex drive). For a few days after chemotherapy, chemotherapy can be excreted through your bodily fluids.  When using the toilet please close the lid and flush the toilet twice.  Do this for a few day after you have had chemotherapy.   Effects of chemotherapy on your sex life Some changes are simple and won't last long. They won't affect your sex life permanently.  Sometimes you may feel: . too tired . not strong enough to be very active . sick or sore  . not in the mood . anxious or low Your anxiety might not seem related to sex. For example, you may be worried about the cancer and how your treatment is going. Or you may be worried about money, or about how you family are coping with your illness.  These things can cause stress, which can affect your interest in sex. It's important to talk to your partner about how you feel.  Remember - the changes to your sex life don't usually last long. There's usually no medical reason to stop having sex during chemo. The drugs won't have any long term physical effects on your performance or enjoyment of sex. Cancer can't be passed on to your partner during sex  Contraception It's important to use reliable contraception during treatment. Avoid getting pregnant while you or your partner are having chemotherapy. This is because the drugs may harm the baby. Sometimes chemotherapy drugs can leave a man or woman infertile.  This means you would not be able to have children in the future. You might want to talk to someone about permanent infertility. It can be very difficult to learn that you may no longer be able to have children. Some people find counselling helpful. There might be ways to preserve your fertility, although this is easier for men than for women. You may want to speak to a fertility expert. You can talk about  sperm banking or harvesting  your eggs. You can also ask about other fertility options, such as donor eggs. If you have or have had breast cancer, your doctor might advise you not to take the contraceptive pill. This is because the hormones in it might affect the cancer. It is not known for sure whether or not chemotherapy drugs can be passed on through semen or secretions from the vagina. Because of this some doctors advise people to use a barrier method if you have sex during treatment. This applies to vaginal, anal or oral sex. Generally, doctors advise a barrier method only for the time you are actually having the treatment and for about a week after your treatment. Advice like this can be worrying, but this does not mean that you have to avoid being intimate with your partner. You can still have close contact with your partner and continue to enjoy sex.  Animals If you have cats or birds we just ask that you not change the litter or change the cage.  Please have someone else do this for you while you are on chemotherapy.   Food Safety During and After Cancer Treatment Food safety is important for people both during and after cancer treatment. Cancer and cancer treatments, such as chemotherapy, radiation therapy, and stem cell/bone marrow transplantation, often weaken the immune system. This makes it harder for your body to protect itself from foodborne illness, also called food poisoning. Foodborne illness is caused by eating food that contains harmful bacteria, parasites, or viruses.  Foods to avoid Some foods have a higher risk of becoming tainted with bacteria. These include: Marland Kitchen Unwashed fresh fruit and vegetables, especially leafy vegetables that can hide dirt and other contaminants . Raw sprouts, such as alfalfa sprouts . Raw or undercooked beef, especially ground beef, or other raw or undercooked meat and poultry . Fatty, fried, or spicy foods immediately before or after treatment.  These can  sit heavy on your stomach and make you feel nauseous. . Raw or undercooked shellfish, such as oysters. . Sushi and sashimi, which often contain raw fish.  . Unpasteurized beverages, such as unpasteurized fruit juices, raw milk, raw yogurt, or cider . Undercooked eggs, such as soft boiled, over easy, and poached; raw, unpasteurized eggs; or foods made with raw egg, such as homemade raw cookie dough and homemade mayonnaise  Simple steps for food safety  Shop smart. . Do not buy food stored or displayed in an unclean area. . Do not buy bruised or damaged fruits or vegetables. . Do not buy cans that have cracks, dents, or bulges. . Pick up foods that can spoil at the end of your shopping trip and store them in a cooler on the way home.  Prepare and clean up foods carefully. . Rinse all fresh fruits and vegetables under running water, and dry them with a clean towel or paper towel. . Clean the top of cans before opening them. . After preparing food, wash your hands for 20 seconds with hot water and soap. Pay special attention to areas between fingers and under nails. . Clean your utensils and dishes with hot water and soap. Marland Kitchen Disinfect your kitchen and cutting boards using 1 teaspoon of liquid, unscented bleach mixed into 1 quart of water.    Dispose of old food. . Eat canned and packaged food before its expiration date (the "use by" or "best before" date). . Consume refrigerated leftovers within 3 to 4 days. After that time, throw out the food. Even if the  food does not smell or look spoiled, it still may be unsafe. Some bacteria, such as Listeria, can grow even on foods stored in the refrigerator if they are kept for too long.  Take precautions when eating out. . At restaurants, avoid buffets and salad bars where food sits out for a long time and comes in contact with many people. Food can become contaminated when someone with a virus, often a norovirus, or another "bug" handles it. . Put any  leftover food in a "to-go" container yourself, rather than having the server do it. And, refrigerate leftovers as soon as you get home. . Choose restaurants that are clean and that are willing to prepare your food as you order it cooked.   AT HOME MEDICATIONS:                                                                                                                                                                Compazine/Prochlorperazine 10mg  tablet. Take 1 tablet every 6 hours as needed for nausea/vomiting. (This can make you sleepy)   EMLA cream. Apply a quarter size amount to port site 1 hour prior to chemo. Do not rub in. Cover with plastic wrap.    Diarrhea Sheet   If you are having loose stools/diarrhea, please purchase Imodium and begin taking as outlined:  At the first sign of poorly formed or loose stools you should begin taking Imodium (loperamide) 2 mg capsules. Take two tablets (4mg ) followed by one tablet (2mg ) every 2 hours - DO NOT EXCEED 8 tablets in 24 hours.  If it is bedtime and you are having loose stools, take 2 tablets at bedtime, then 2 tablets every 4 hours until morning.   Always call the [facility] if you are having loose stools/diarrhea that you can't get under control.  Loose stools/diarrhea leads to dehydration (loss of water) in your body.  We have other options of trying to get the loose stools/diarrhea to stop but you must let us know!   Constipation Sheet  Colace - 100 mg capsules - take 2 capsules daily.  If this doesn't help then you can increase to 2 capsules twice daily.  Please call if the above does not work for you. Do not go more than 2 days without a bowel movement.  It is very important that you do not become constipated.  It will make you feel sick to your stomach (nausea) and can cause abdominal pain and vomiting.  Nausea Sheet   Compazine/Prochlorperazine 10mg  tablet. Take 1 tablet every 6 hours as needed for nausea/vomiting (This can  make you drowsy).  If you are having persistent nausea (nausea that does not stop) please call the [facility] and let us know the amount of nausea that you  are experiencing.  If you begin to vomit, you need to call the [facility] and if it is the weekend and you have vomited more than one time and can't get it to stop-go to the Emergency Room.  Persistent nausea/vomiting can lead to dehydration (loss of fluid in your body) and will make you feel very weak and unwell. Ice chips, sips of clear liquids, foods that are at room temperature, crackers, and toast tend to be better tolerated.   SYMPTOMS TO REPORT AS SOON AS POSSIBLE AFTER TREATMENT:  FEVER GREATER THAN 100.4 F  CHILLS WITH OR WITHOUT FEVER  NAUSEA AND VOMITING THAT IS NOT CONTROLLED WITH YOUR NAUSEA MEDICATION  UNUSUAL SHORTNESS OF BREATH  UNUSUAL BRUISING OR BLEEDING  TENDERNESS IN MOUTH AND THROAT WITH OR WITHOUT   PRESENCE OF ULCERS  URINARY PROBLEMS  BOWEL PROBLEMS  UNUSUAL RASH     Wear comfortable clothing and clothing appropriate for easy access to any Portacath or PICC line. Let us know if there is anything that we can do to make your therapy better!    What to do if you need assistance after hours or on the weekends: CALL [phone].  HOLD on the line, do not hang up.  You will hear multiple messages but at the end you will be connected with a nurse triage line.  They will contact the doctor if necessary.  Most of the time they will be able to assist you.  Do not call the hospital operator.      I have been informed and understand all of the instructions given to me and have received a copy. I have been instructed to call the clinic [phone] or my family physician as soon as possible for continued medical care, if indicated. I do not have any more questions at this time but understand that I may call the [facility] or the Patient Navigator at [phone] during office hours should I have  questions or need assistance in obtaining follow-up care.      4

## 2019-12-02 ENCOUNTER — Other Ambulatory Visit (HOSPITAL_COMMUNITY): Payer: Self-pay | Admitting: *Deleted

## 2019-12-02 ENCOUNTER — Inpatient Hospital Stay (HOSPITAL_COMMUNITY): Payer: BC Managed Care – PPO

## 2019-12-02 DIAGNOSIS — R9431 Abnormal electrocardiogram [ECG] [EKG]: Secondary | ICD-10-CM

## 2019-12-02 LAB — BASIC METABOLIC PANEL
Anion gap: 10 (ref 5–15)
BUN: 12 mg/dL (ref 8–23)
CO2: 21 mmol/L — ABNORMAL LOW (ref 22–32)
Calcium: 8.3 mg/dL — ABNORMAL LOW (ref 8.9–10.3)
Chloride: 106 mmol/L (ref 98–111)
Creatinine, Ser: 0.65 mg/dL (ref 0.44–1.00)
GFR, Estimated: >60 mL/min (ref >60)
Glucose, Bld: 79 mg/dL (ref 70–99)
Potassium: 3.8 mmol/L (ref 3.5–5.1)
Sodium: 137 mmol/L (ref 135–145)

## 2019-12-02 LAB — GLUCOSE, CAPILLARY
Glucose-Capillary: 67 mg/dL — ABNORMAL LOW (ref 70–99)
Glucose-Capillary: 115 mg/dL — ABNORMAL HIGH (ref 70–99)
Glucose-Capillary: 119 mg/dL — ABNORMAL HIGH (ref 70–99)
Glucose-Capillary: 93 mg/dL (ref 70–99)
Glucose-Capillary: 123 mg/dL — ABNORMAL HIGH (ref 70–99)

## 2019-12-02 LAB — ECHOCARDIOGRAM COMPLETE
AR max vel: 1.81 cm2
AV Area VTI: 1.83 cm2
AV Area mean vel: 1.59 cm2
AV Mean grad: 3.1 mmHg
AV Peak grad: 6.4 mmHg
Ao pk vel: 1.26 m/s
Area-P 1/2: 4.21 cm2
Calc EF: 66.6 %
S' Lateral: 2.16 cm
Single Plane A2C EF: 66.5 %
Single Plane A4C EF: 65.2 %
Weight: 1827.17 oz

## 2019-12-02 LAB — MAGNESIUM: Magnesium: 1.4 mg/dL — ABNORMAL LOW (ref 1.7–2.4)

## 2019-12-02 MED ORDER — FENTANYL 50 MCG/HR TD PT72
1.0000 | MEDICATED_PATCH | TRANSDERMAL | Status: DC
  Administered 2019-12-02: 1 via TRANSDERMAL
  Filled 2019-12-02: qty 1

## 2019-12-02 MED ORDER — MAGNESIUM SULFATE 4 GM/100ML IV SOLN
4.0000 g | Freq: Once | INTRAVENOUS | Status: AC
  Administered 2019-12-02: 4 g via INTRAVENOUS
  Filled 2019-12-02: qty 100

## 2019-12-02 MED ORDER — GADOBUTROL 1 MMOL/ML IV SOLN
5.0000 mL | Freq: Once | INTRAVENOUS | Status: AC | PRN
  Administered 2019-12-02: 5 mL via INTRAVENOUS

## 2019-12-02 MED ORDER — INSULIN GLARGINE 100 UNIT/ML ~~LOC~~ SOLN
10.0000 [IU] | Freq: Every day | SUBCUTANEOUS | Status: DC
  Administered 2019-12-02 – 2019-12-05 (×3): 10 [IU] via SUBCUTANEOUS
  Filled 2019-12-02 (×7): qty 0.1

## 2019-12-02 NOTE — Progress Notes (Signed)
  Echocardiogram 2D Echocardiogram has been performed.  Krista Doyle 12/02/2019, 1:56 PM

## 2019-12-02 NOTE — Progress Notes (Addendum)
Patient's 0700 blood sugar is 67. Patient given apple juice. Will recheck again after breakfast trays come and patient has had time to eat.   Addendum: Blood sugar recheck was 115. Will check again before lunch.

## 2019-12-02 NOTE — Progress Notes (Signed)
.   Pharmacist Chemotherapy Monitoring - Initial Assessment    Anticipated start date: 12/05/19   Regimen:  . Are orders appropriate based on the patient's diagnosis, regimen, and cycle? Yes . Does the plan date match the patient's scheduled date? Yes . Is the sequencing of drugs appropriate? Yes . Are the premedications appropriate for the patient's regimen? Yes . Prior Authorization for treatment is: Approved If applicable, is the correct biosimilar selected based on the patient's insurance? Yes - confirmed with Lendell Caprice that [place] were approved as patient has [facility] Medicare and she said they approved Ogivri and Fulphila. Organ Function and Labs: Marland Kitchen Are dose adjustments needed based on the patient's renal function, hepatic function, or hematologic function? No . Are appropriate labs ordered prior to the start of patient's treatment? Yes . Other organ system assessment, if indicated: trastuzumab: Echo/ MUGA . The following baseline labs, if indicated, have been ordered: N/A  Dose Assessment: . Are the drug doses appropriate? Yes . Are the following correct: o Drug concentrations Yes o IV fluid compatible with drug Yes o Administration routes Yes o Timing of therapy Yes . If applicable, does the patient have documented access for treatment and/or plans for port-a-cath placement? not applicable . If applicable, have lifetime cumulative doses been properly documented and assessed? yes Lifetime Dose Tracking  No doses have been documented on this patient for the following tracked chemicals: Doxorubicin, Epirubicin, Idarubicin, Daunorubicin, Mitoxantrone, Bleomycin, Oxaliplatin, Carboplatin, Liposomal Doxorubicin  o   Toxicity Monitoring/Prevention: . The patient has the following take home antiemetics prescribed: Ondansetron . The patient has the following take home medications prescribed: N/A . Medication allergies and previous infusion related reactions, if applicable,  have been reviewed and addressed. Yes . The patient's current medication list has been assessed for drug-drug interactions with their chemotherapy regimen. no significant drug-drug interactions were identified on review.  Order Review: . Are the treatment plan orders signed? No . Is the patient scheduled to see a provider prior to their treatment? Yes  I verify that I have reviewed each item in the above checklist and answered each question accordingly.  Wynona Neat 12/02/2019 3:28 PM

## 2019-12-02 NOTE — Progress Notes (Signed)
PROGRESS NOTE    HETTIE ROSELLI  TMH:[id number] DOB: [birth year] DOA: 11/29/2019 PCP: [name], MD    Brief Narrative:  Keary Hanak  is a 59 y.o. female, with past medical history of hypertension, hyperlipidemia, chemotherapy-induced neuropathy, diabetes mellitus, history of breast cancer visually diagnosed in 2008, status post radiation therapy and chemotherapy,  with recent diagnosis of metastatic disease to liver and osseous metastasis, patient was sent from oncology office for hypercalcemia, patient was recently diagnosed with lumbar/thoracic spine osseous metastasis, liver metastasis as well, patient work-up was significant for hypercalcemia at oncology office yesterday, where she received IV fluids and IV Zometa, today as well she received IV fluids and calcitonin in oncology office and sent to ED for further management, husband reports patient has more altered and confused over last 24 hours, as well has been weaker than her baseline, she has been reporting headache for 3 weeks, and has poor oral appetite, and oral intake, she has chronic nausea, but no vomiting. - in ED her calcium came<15, elevated at 1.3, baseline 0.8, potassium low at 3.3, MRI brain significant for 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   Assessment & Plan:   Active Problems:   Breast cancer of upper-outer quadrant of left female breast ([place])   Lumbar radiculopathy   Chemotherapy-induced peripheral neuropathy (HCC)   Metastatic breast cancer (HCC)   Hypercalcemia   Hypercalcemia -Patient admitted with severe hypercalcemia greater than 15 in the setting of metastatic breast cancer -She received Zometa prior to admission -She was also started on calcitonin -She was aggressively hydrated with IV fluids -Overall serum calcium has normalized -PTH was in normal range, PTH related peptide in process  Metastatic  breast cancer -Evidence of metastases to liver, bone, lung -Continue follow-up with oncology -MRI brain showed evidence of skull metastases.  Discussed with oncology and felt to be unlikely related to leptomeningeal spread or intracranial spread.  No midline shift or mass-effect on imaging  Acute metabolic encephalopathy -Related to hypercalcemia -Mental status improving and appears to be back to baseline  Hypokalemia -Replace  Acute kidney injury -Creatinine of 0.8 at baseline -Peak creatinine up to 1.6 -This has since trended down with IV fluids  Insulin-dependent diabetes.   -On Lantus and sliding scale insulin -Continue to follow blood sugars -She is having episodes of hypoglycemia, will reduce Lantus dose  Chronic pain syndrome secondary to underlying metastatic breast cancer -Currently on fentanyl patch and Dilaudid as needed -Reminded her that Dilaudid was ordered every 4 hours as needed -Continue with Lyrica and Robaxin -We will increase fentanyl patch since pain remains uncontrolled  Opioid-induced constipation -Continue with Movantik  Sinus tachycardia -Heart rate persistently in the 110s to 120s -D-dimer is elevated -CT angiogram of the chest performed was negative for pulmonary embolus -It did demonstrate widespread metastatic disease -Echocardiogram shows normal ejection fraction -Suspect that her tachycardia is related to pain -Continue pain management  Cervical spine fracture of C2 -Pathologic fracture in the setting of metastatic disease -Reviewed images with neurosurgery Dr. Zada Finders who did not recommend any surgical management at this time. -Recommendations were to place the patient in a cervical collar with plans for outpatient neurosurgical follow-up -Activity as tolerated  DVT prophylaxis: heparin injection 5,000 Units Start: 11/30/19 0600 SCDs Start: 11/30/19 0005  Code Status: Full code Family Communication: Discussed with husband at the  bedside and son over the phone Disposition Plan: Status is: Inpatient  Remains inpatient appropriate  because:Hemodynamically unstable   Dispo: The patient is from: Home              Anticipated d/c is to: TBD              Anticipated d/c date is: 2 days              Patient currently is not medically stable to d/c.    Consultants:   Neurosurgery, Dr. Zada Finders (phone)  Procedures:     Antimicrobials:       Subjective: Continues to have 7/10 pain. Has significant pain in her neck and is unable to look to the left and right.  No numbness or tingling in her upper or lower extremities  Objective: Vitals:   12/02/19 1100 12/02/19 1200 12/02/19 1400 12/02/19 1642  BP:  131/68    Pulse:  (!) 58 (!) 106   Resp:  17 17   Temp: 97.6 F (36.4 C)   98.5 F (36.9 C)  TempSrc:      SpO2:  91% 99%   Weight:        Intake/Output Summary (Last 24 hours) at 12/02/2019 1937 Last data filed at 12/02/2019 1237 Gross per 24 hour  Intake 87.36 ml  Output 800 ml  Net -712.64 ml   Filed Weights   11/30/19 0036 11/30/19 0444 12/01/19 0430  Weight: 51.2 kg 53.2 kg 51.8 kg    Examination:  General exam: Alert, awake, oriented x 3 Respiratory system: Clear to auscultation. Respiratory effort normal. Cardiovascular system:RRR. No murmurs, rubs, gallops. Gastrointestinal system: Abdomen is nondistended, soft and nontender. No organomegaly or masses felt. Normal bowel sounds heard. Central nervous system: Alert and oriented. No focal neurological deficits. Extremities: No C/C/E, +pedal pulses Skin: No rashes, lesions or ulcers Psychiatry: Judgement and insight appear normal. Mood & affect appropriate.    Data Reviewed: I have personally reviewed following labs and imaging studies  CBC: Recent Labs  Lab 11/28/19 1302 11/29/19 1437 11/30/19 0457 12/01/19 0845  WBC 9.5 6.3 5.8 9.6  NEUTROABS 5.5 5.5  --   --   HGB 10.7* 10.0* 8.6* 8.7*  HCT 34.3* 33.1* 28.7* 27.9*  MCV 83.3  85.3 85.9 82.5  PLT 449* 367 317 086   Basic Metabolic Panel: Recent Labs  Lab 11/30/19 0457 11/30/19 0835 11/30/19 1227 12/01/19 0845 12/02/19 0518  NA 138 135 136 133* 137  K 2.9* 3.3* 3.2* 3.1* 3.8  CL 106 106 106 102 106  CO2 23 19* 21* 22 21*  GLUCOSE 90 84 120* 82 79  BUN 13 13 12 10 12   CREATININE 0.77 0.78 0.72 0.62 0.65  CALCIUM 9.8 9.7 9.5 8.8* 8.3*  MG  --   --   --   --  1.4*   GFR: Estimated Creatinine Clearance: 60.4 mL/min (by C-G formula based on SCr of 0.65 mg/dL). Liver Function Tests: Recent Labs  Lab 11/28/19 1302 11/29/19 1054 11/30/19 0457 12/01/19 0845  AST 53* 49* 39 49*  ALT 15 15 14 16   ALKPHOS 187* 172* 140* 162*  BILITOT 0.4 0.6 0.5 0.3  PROT 9.2* 8.6* 7.3 8.0  ALBUMIN 3.2* 3.1* 2.5* 2.7*   No results for input(s): LIPASE, AMYLASE in the last 168 hours. No results for input(s): AMMONIA in the last 168 hours. Coagulation Profile: Recent Labs  Lab 11/29/19 1437 11/30/19 0457  INR 1.1 1.3*   Cardiac Enzymes: No results for input(s): CKTOTAL, CKMB, CKMBINDEX, TROPONINI in the last 168 hours. BNP (last 3 results) No  results for input(s): PROBNP in the last 8760 hours. HbA1C: No results for input(s): HGBA1C in the last 72 hours. CBG: Recent Labs  Lab 12/01/19 2202 12/02/19 0727 12/02/19 0920 12/02/19 1106 12/02/19 1615  GLUCAP 73 67* 115* 119* 93   Lipid Profile: No results for input(s): CHOL, HDL, LDLCALC, TRIG, CHOLHDL, LDLDIRECT in the last 72 hours. Thyroid Function Tests: Recent Labs    12/01/19 0845  FREET4 0.62   Anemia Panel: No results for input(s): VITAMINB12, FOLATE, FERRITIN, TIBC, IRON, RETICCTPCT in the last 72 hours. Sepsis Labs: Recent Labs  Lab 11/29/19 1437 11/29/19 1629  LATICACIDVEN 1.6 1.6    Recent Results (from the past 240 hour(s))  Respiratory Panel by RT PCR (Flu A&B, Covid) - Nasopharyngeal Swab     Status: None   Collection Time: 11/29/19  2:05 PM   Specimen: Nasopharyngeal Swab   Result Value Ref Range Status   SARS Coronavirus 2 by RT PCR NEGATIVE NEGATIVE Final    Comment: (NOTE) SARS-CoV-2 target nucleic acids are NOT DETECTED.  The SARS-CoV-2 RNA is generally detectable in upper respiratoy specimens during the acute phase of infection. The lowest concentration of SARS-CoV-2 viral copies this assay can detect is 131 copies/mL. A negative result does not preclude SARS-Cov-2 infection and should not be used as the sole basis for treatment or other patient management decisions. A negative result may occur with  improper specimen collection/handling, submission of specimen other than nasopharyngeal swab, presence of viral mutation(s) within the areas targeted by this assay, and inadequate number of viral copies (<131 copies/mL). A negative result must be combined with clinical observations, patient history, and epidemiological information. The expected result is Negative.  Fact Sheet for Patients:  PinkCheek.be  Fact Sheet for Healthcare Providers:  GravelBags.it  This test is no t yet approved or cleared by the [place] FDA and  has been authorized for detection and/or diagnosis of SARS-CoV-2 by FDA under an Emergency Use Authorization (EUA). This EUA will remain  in effect (meaning this test can be used) for the duration of the COVID-19 declaration under Section 564(b)(1) of the Act, 21 U.S.C. section 360bbb-3(b)(1), unless the authorization is terminated or revoked sooner.     Influenza A by PCR NEGATIVE NEGATIVE Final   Influenza B by PCR NEGATIVE NEGATIVE Final    Comment: (NOTE) The Xpert Xpress SARS-CoV-2/FLU/RSV assay is intended as an aid in  the diagnosis of influenza from Nasopharyngeal swab specimens and  should not be used as a sole basis for treatment. Nasal washings and  aspirates are unacceptable for Xpert Xpress SARS-CoV-2/FLU/RSV  testing.  Fact Sheet for  Patients: PinkCheek.be  Fact Sheet for Healthcare Providers: GravelBags.it  This test is not yet approved or cleared by the [place] FDA and  has been authorized for detection and/or diagnosis of SARS-CoV-2 by  FDA under an Emergency Use Authorization (EUA). This EUA will remain  in effect (meaning this test can be used) for the duration of the  Covid-19 declaration under Section 564(b)(1) of the Act, 21  U.S.C. section 360bbb-3(b)(1), unless the authorization is  terminated or revoked. Performed at [facility], [address], [place], [place] [address]   Blood culture (routine single)     Status: None (Preliminary result)   Collection Time: 11/29/19  2:45 PM   Specimen: BLOOD  Result Value Ref Range Status   Specimen Description BLOOD  Final   Special Requests NONE  Final   Culture   Final    NO  GROWTH 3 DAYS Performed at [facility], [address], [place], [place] [address]    Report Status PENDING  Incomplete  Urine culture     Status: None   Collection Time: 11/29/19  3:50 PM   Specimen: In/Out Cath Urine  Result Value Ref Range Status   Specimen Description   Final    IN/OUT CATH URINE Performed at [facility], [address], [place], [place] [address]    Special Requests   Final    NONE Performed at [facility], [address], [place], [place] [address]    Culture   Final    NO GROWTH Performed at [facility], [place] [address] [place], [place] [address]    Report Status 12/01/2019 FINAL  Final  MRSA PCR Screening     Status: Abnormal   Collection Time: 11/30/19 12:04 AM   Specimen: Nasopharyngeal  Result Value Ref Range Status   MRSA by PCR POSITIVE (A) NEGATIVE Final    Comment:        The GeneXpert MRSA Assay (FDA approved for NASAL specimens only), is one component of a comprehensive MRSA colonization surveillance program. It is not intended to diagnose MRSA infection nor to guide  or monitor treatment for MRSA infections. RESULT CALLED TO, READ BACK BY AND VERIFIED WITH: ALSTON @ [id number] ON [id number] BY HENDERSON L. Performed at [facility], [address], [place], [place] [address]          Radiology Studies: CT SOFT TISSUE NECK W CONTRAST  Result Date: 12/01/2019 CLINICAL DATA:  Epiglottitis or tonsillitis suspected.  Neck pain. EXAM: CT NECK WITH CONTRAST TECHNIQUE: Multidetector CT imaging of the neck was performed using the standard protocol following the bolus administration of intravenous contrast. CONTRAST:  62mL OMNIPAQUE IOHEXOL 350 MG/ML SOLN COMPARISON:  11/29/2019 CT and MRI head.  Concurrent CTA chest. FINDINGS: Pharynx and larynx: Clear nasopharynx. Normal appearance of the epiglottis. Partial effacement of the left vallecula and piriform sinus, likely secondary to neck positioning. The vallecula and piriform sinuses are otherwise unremarkable. No vocal cord paralysis. Small retropharyngeal effusion spanning the C2-C5 levels. Salivary glands: No inflammation, mass, or stone. Thyroid: Normal. Lymph nodes: Diffusely prominent subcentimeter cervical nodes. Vascular: Normal intravascular enhancement. Limited intracranial: Please see recent CT and MRI head. Visualized orbits: Normal orbits. Mastoids and visualized paranasal sinuses: Pneumatized. Skeleton: Diffuse heterogeneity of the bone marrow predominantly involving the cervical spine at the C1-2 and C6-T1 levels. There is multilevel involvement of the posterior elements. Vertebral body heights are preserved. Perivertebral inflammatory changes most prominent at the C2 level. Please see recent CT and MRI head for better evaluation of dominant clival and multifocal calvarial osseous metastases. Redemonstration of anterior left TMJ metastasis. Upper chest: Partially imaged pulmonary metastases. Please see concurrent CTA chest for additional findings below the thoracic inlet. Other: None. IMPRESSION: No evidence of  epiglottitis or tonsillitis. Diffuse osseous metastases with prominent involvement of the cervical spine. Vertebral body heights are preserved however cannot exclude pending pathologic fracture. Perivertebral inflammatory changes most prominent at the atlantoaxial junction with small retropharyngeal effusion. Cannot exclude metastatic soft tissue involvement. MRI cervical spine is recommended for better evaluation. Partially imaged pulmonary metastases. Please see concurrent CTA chest for additional findings below the thoracic inlet. These results will be called to the ordering clinician or representative by the Radiologist Assistant, and communication documented in the PACS or [employer]. Electronically Signed   By: Primitivo Gauze M.D.   On: 12/01/2019 19:30   CT  ANGIO CHEST PE W OR WO CONTRAST  Result Date: 12/01/2019 CLINICAL DATA:  PE suspected, low/intermediate prob, positive D-dimer History of metastatic breast cancer. EXAM: CT ANGIOGRAPHY CHEST WITH CONTRAST TECHNIQUE: Multidetector CT imaging of the chest was performed using the standard protocol during bolus administration of intravenous contrast. Multiplanar CT image reconstructions and MIPs were obtained to evaluate the vascular anatomy. CONTRAST:  23mL OMNIPAQUE IOHEXOL 350 MG/ML SOLN COMPARISON:  Chest CT 11/12/2019, thoracic spine MRI 11/18/2019 FINDINGS: Cardiovascular: There are no filling defects within the pulmonary arteries to suggest pulmonary embolus. Left hilar lymph node is enlarging in causing increasing mass effect on lingular pulmonary artery but no evidence of invasion. Common origin of brachiocephalic and left common carotid artery, variant arch anatomy. Extensive mural thickening with severe luminal narrowing involving the proximal left subclavian artery is unchanged from recent exam. There is no aortic dissection or acute aortic findings. The heart is normal in size. No pericardial effusion. Mediastinum/Nodes: Left hilar  lymph node has slightly increased in size currently measuring 18 x 15 mm, previously 16 x 14 mm. Soft tissue mass in the epicardial fat along the cardiac apex measures 3.3 x 2.1 cm (series 5, image 68), previously 2.7 x 2.0 cm. No esophageal wall thickening. Previous left thyroid nodule is not well-defined on the current exam due to streak artifact in the right subclavian vein from dense IV contrast. Surgical clips in the left axilla. Previous small right axillary nodes are partially obscured by dense IV contrast. Lungs/Pleura: Extensive bilateral pulmonary nodules are again seen, both random and pleural/subpleural. Majority of these are stable from recent prior, however largest nodule currently measures 2.2 x 1.2 cm, series 7, image 51, previously 1.9 x 1.1 cm. A small right pleural effusion is new, with adjacent compressive atelectasis. No septal thickening or findings of pulmonary edema. Upper Abdomen: Known liver lesions on prior exam are less well-defined given phase of IV contrast. Musculoskeletal: Diffuse osseous metastatic disease throughout the thoracic osseous structures lateral left breast soft tissue nodularity measures 15 x 12 mm, series 5, image 49. With lytic and blastic osseous lesions. Callus formation about right anterior ribs fractures, also seen on previous. Review of the MIP images confirms the above findings. IMPRESSION: 1. No pulmonary embolus. 2. New small right pleural effusion with adjacent compressive atelectasis. 3. Extensive bilateral pulmonary nodules consistent with metastatic disease. Majority of these are stable from recent prior, however largest pulmonary nodule has increased in size from prior. 4. Left hilar lymph node has slightly increased in size from recent prior. Soft tissue mass in the epicardial fat along the left cardiac apex has increased in size. 5. Diffuse osseous metastatic disease. 6. Left breast soft tissue nodularity laterally again seen. 7. Known liver lesions on  prior exam are less well-defined on the current exam given phase of IV contrast. 8. Extensive mural thickening with severe luminal narrowing involving the proximal left subclavian artery is unchanged from recent prior. Electronically Signed   By: Keith Rake M.D.   On: 12/01/2019 19:20   MR CERVICAL SPINE W WO CONTRAST  Result Date: 12/02/2019 CLINICAL DATA:  Metastatic breast cancer with possible pathologic fracture in the cervical spine. EXAM: MRI CERVICAL SPINE WITHOUT AND WITH CONTRAST TECHNIQUE: Multiplanar and multiecho pulse sequences of the cervical spine, to include the craniocervical junction and cervicothoracic junction, were obtained without and with intravenous contrast. CONTRAST:  74mL GADAVIST GADOBUTROL 1 MMOL/ML IV SOLN COMPARISON:  Neck CT 12/01/2019. Brain MRI 11/29/2019. Thoracic spine MRI 11/18/2019. FINDINGS: Alignment:  Minimal retrolisthesis of C5 on C6 and C6 on C7. Vertebrae: Diffusely abnormal bone marrow signal throughout the cervical spine, included upper thoracic spine, and skull base consistent with known widespread osseous metastatic disease. Thin curvilinear region of low signal through the base of the dens suggestive of a nondisplaced fracture with possible extension more laterally in the C2 vertebral body. Extra osseous tumor extension about the C2 anterior and posterior elements. Cord: Normal signal. Posterior Fossa, vertebral arteries, paraspinal tissues: Preserved vertebral artery flow voids. Small prevertebral effusion. Previously described clival metastasis with overlying dural thickening extending into the upper cervical spine. No masslike cervical epidural tumor. Disc levels: Cervical disc degeneration greatest at C5-6 where a broad-based posterior disc osteophyte complex and infolding of the ligamentum flavum result in mild-to-moderate spinal stenosis and mild-to-moderate right and severe left neural foraminal stenosis. Disc bulging and mild spurring at C6-7  resulting in moderate right and mild left neural foraminal stenosis without significant spinal stenosis. IMPRESSION: 1. Widespread osseous metastatic disease. 2. Suspected nondisplaced pathologic fracture involving the C2 vertebral body and base of dens. 3. 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 No masslike cervical dural thickening/epidural tumor. 4. Cervical disc degeneration, worst at C5-6 where there is mild-to-moderate spinal stenosis and severe left neural foraminal stenosis. These results will be called to the ordering clinician or representative by the Radiologist Assistant, and communication documented in the PACS or [employer]. Electronically Signed   By: Logan Bores M.D.   On: 12/02/2019 17:01   ECHOCARDIOGRAM COMPLETE  Result Date: 12/02/2019    ECHOCARDIOGRAM REPORT   Patient Name:   CENA BRUHN Date of Exam: 12/02/2019 Medical Rec #:  [id number]        Height:       63.0 in Accession #:    [id number]       Weight:       114.2 lb Date of Birth:  August 05, 1960       BSA:          1.524 m Patient Age:    4 years         BP:           131/68 mmHg Patient Gender: F                HR:           99 bpm. Exam Location:  [facility] Procedure: 2D Echo, Cardiac Doppler and Color Doppler Indications:    R94.31 Abnormal EKG; R00.0 Tachycardia  History:        Patient has no prior history of Echocardiogram examinations.                 Abnormal ECG. Breast cancer. Chemotherapy.  Sonographer:    Roseanna Rainbow RDCS (AE) Referring Phys: [phone] [facility] Ary Rudnick  Sonographer Comments: Technically difficult study due to poor echo windows. Rib artifact. Attempted to turn. Images off-axis. IMPRESSIONS  1. Left ventricular ejection fraction, by estimation, is 60 to 65%. The left ventricle has normal function. The left ventricle has no regional wall motion abnormalities. Left ventricular diastolic parameters are consistent with Grade I  diastolic dysfunction (impaired relaxation).  2. Right ventricular systolic function is normal. The right ventricular size is normal.  3. The mitral valve is normal in structure. No evidence of mitral valve regurgitation. No evidence of mitral stenosis.  4. The aortic valve is tricuspid. Aortic valve regurgitation is not visualized. No aortic stenosis is present.  5. The inferior vena cava is normal in size with greater than 50% respiratory variability, suggesting right atrial pressure of 3 mmHg. FINDINGS  Left Ventricle: Left ventricular ejection fraction, by estimation, is 60 to 65%. The left ventricle has normal function. The left ventricle has no regional wall motion abnormalities. The left ventricular internal cavity size was normal in size. There is  no left ventricular hypertrophy. Left ventricular diastolic parameters are consistent with Grade I diastolic dysfunction (impaired relaxation). Normal left ventricular filling pressure. Right Ventricle: The right ventricular size is normal. No increase in right ventricular wall thickness. Right ventricular systolic function is normal. Left Atrium: Left atrial size was normal in size. Right Atrium: Right atrial size was normal in size. Pericardium: There is no evidence of pericardial effusion. Mitral Valve: The mitral valve is normal in structure. No evidence of mitral valve regurgitation. No evidence of mitral valve stenosis. Tricuspid Valve: The tricuspid valve is normal in structure. Tricuspid valve regurgitation is not demonstrated. No evidence of tricuspid stenosis. Aortic Valve: The aortic valve is tricuspid. Aortic valve regurgitation is not visualized. No aortic stenosis is present. Aortic valve mean gradient measures 3.1 mmHg. Aortic valve peak gradient measures 6.4 mmHg. Aortic valve area, by VTI measures 1.83 cm. Pulmonic Valve: The pulmonic valve was not well visualized. Pulmonic valve regurgitation is not visualized. No evidence of pulmonic stenosis.  Aorta: The aortic root is normal in size and structure. Pulmonary Artery: Indeterminant PASP, inadequate TR jet. Venous: The inferior vena cava is normal in size with greater than 50% respiratory variability, suggesting right atrial pressure of 3 mmHg. IAS/Shunts: No atrial level shunt detected by color flow Doppler.  LEFT VENTRICLE PLAX 2D LVIDd:         3.36 cm     Diastology LVIDs:         2.16 cm     LV e' medial:   6.96 cm/s LV PW:         1.03 cm     LV E/e' medial: 12.4 LV IVS:        0.94 cm LVOT diam:     1.70 cm LV SV:         38 LV SV Index:   25 LVOT Area:     2.27 cm  LV Volumes (MOD) LV vol d, MOD A2C: 55.0 ml LV vol d, MOD A4C: 32.5 ml LV vol s, MOD A2C: 18.4 ml LV vol s, MOD A4C: 11.3 ml LV SV MOD A2C:     36.6 ml LV SV MOD A4C:     32.5 ml LV SV MOD BP:      32.0 ml RIGHT VENTRICLE RV S prime:     11.30 cm/s TAPSE (M-mode): 1.5 cm LEFT ATRIUM           Index      RIGHT [facility]          Index LA diam:      2.20 cm 1.44 cm/m RA Area:     7.17 cm LA Vol (A4C): 14.0 ml 9.19 ml/m RA Volume:   12.20 ml 8.01 ml/m  AORTIC VALVE AV Area (Vmax):    1.81 cm AV Area (Vmean):   1.59 cm AV Area (VTI):     1.83 cm AV Vmax:           126.43 cm/s AV Vmean:          81.029 cm/s AV VTI:  0.208 m AV Peak Grad:      6.4 mmHg AV Mean Grad:      3.1 mmHg LVOT Vmax:         101.00 cm/s LVOT Vmean:        56.600 cm/s LVOT VTI:          0.168 m LVOT/AV VTI ratio: 0.81  AORTA Ao Root diam: 2.80 cm Ao Asc diam:  3.20 cm MITRAL VALVE MV Area (PHT): 4.21 cm    SHUNTS MV Decel Time: 180 msec    Systemic VTI:  0.17 m MV E velocity: 86.50 cm/s  Systemic Diam: 1.70 cm MV A velocity: 90.80 cm/s MV E/A ratio:  0.95 Carlyle Dolly MD Electronically signed by Carlyle Dolly MD Signature Date/Time: 12/02/2019/3:28:22 PM    Final         Scheduled Meds: . amitriptyline  20 mg Oral QHS  . Chlorhexidine Gluconate Cloth  6 each Topical Daily  . feeding supplement  1 Container Oral TID BM  . fentaNYL  1 patch  Transdermal Q72H  . heparin  5,000 Units Subcutaneous Q8H  . insulin aspart  0-5 Units Subcutaneous QHS  . insulin aspart  0-9 Units Subcutaneous TID WC  . insulin glargine  10 Units Subcutaneous QHS  . letrozole  2.5 mg Oral Daily  . magic mouthwash w/lidocaine  15 mL Oral TID  . megestrol  20 mg Oral BID  . metoprolol tartrate  25 mg Oral BID  . mometasone-formoterol  2 puff Inhalation BID  . mupirocin ointment   Nasal BID  . naloxegol oxalate  25 mg Oral Daily  . pregabalin  100 mg Oral BID  . senna  8.6 mg Oral Daily   Continuous Infusions:    LOS: 3 days    Time spent: 60mins    Kathie Dike, MD [facility]   If 7PM-7AM, please contact night-coverage [email or web address]  12/02/2019, 7:37 PM

## 2019-12-02 NOTE — Progress Notes (Signed)
Pt daughter in law called asking for information. This RN spoke with the pt and the pt stated that she gave permission for her spouse, son, and daughter in law to receive updates and information. Daughter in law and son added to chart.

## 2019-12-02 NOTE — TOC Initial Note (Signed)
Transition of Care [facility]) - Initial/Assessment Note    Patient Details  Name: Krista [place] MRN: [id number] Date of Birth: 04/08/1960  [facility]) CM/SW Contact:    Iona Beard, [place] Phone Number: 12/02/2019, 7:17 PM  Clinical Narrative:                 Pt admitted for . CSW spoke with pt due to high risk for readmission. Pt stated that she lives with her husband. Pt states that she has been able to complete ADLs independently but due to headaches recently she has difficulty. Pt states that she provides her transformation. Pt states that she has not had HH. Pt states taht she has a cane and walker at home. CSW asked if pt had any DME needs. Pt asked about rollator walker, CSW stated that [facility] can make referral to DME company for rollator. Pt states that she would be interested in [facility] at d/c if recommended by MD. TOC to follow.   Expected Discharge Plan: Home/Self Care Barriers to Discharge: Continued Medical Work up   Patient Goals and CMS Choice Patient states their goals for this hospitalization and ongoing recovery are:: Return home   Choice offered to / list presented to : NA  Expected Discharge Plan and Services Expected Discharge Plan: Home/Self Care In-house Referral: NA Discharge Planning Services: NA Post Acute Care Choice: Durable Medical Equipment Living arrangements for the past 2 months: Single Family Home                                      Prior Living Arrangements/Services Living arrangements for the past 2 months: Single Family Home Lives with:: Spouse Patient language and need for interpreter reviewed:: Yes Do you feel safe going back to the place where you live?: Yes        Care giver support system in place?: Yes (comment) (Eshbach,Eugene (Spouse) [phone]) Current home services: DME (Cane and walker) Criminal Activity/Legal Involvement Pertinent to Current Situation/Hospitalization: No - Comment as needed  Activities of  Daily Living Home Assistive Devices/Equipment: Walker (specify type), Insulin Pump, Eyeglasses, CBG Meter ADL Screening (condition at time of admission) Patient's cognitive ability adequate to safely complete daily activities?: Yes Is the patient deaf or have difficulty hearing?: No Does the patient have difficulty seeing, even when wearing glasses/contacts?: No Does the patient have difficulty concentrating, remembering, or making decisions?: No Patient able to express need for assistance with ADLs?: Yes Does the patient have difficulty dressing or bathing?: No Independently performs ADLs?: No Communication: Independent Dressing (OT): Needs assistance Is this a change from baseline?: Pre-admission baseline Grooming: Needs assistance Is this a change from baseline?: Pre-admission baseline Feeding: Independent Bathing: Needs assistance Is this a change from baseline?: Pre-admission baseline In/Out Bed: Needs assistance Is this a change from baseline?: Pre-admission baseline Walks in Home: Needs assistance Is this a change from baseline?: Pre-admission baseline Does the patient have difficulty walking or climbing stairs?: Yes Weakness of Legs: Both Weakness of Arms/Hands: Both  Permission Sought/Granted                  Emotional Assessment Appearance:: Appears stated age Attitude/Demeanor/Rapport: Engaged Affect (typically observed): Accepting Orientation: : Oriented to Self, Oriented to Place, Oriented to  Time, Oriented to Situation Alcohol / Substance Use: Not Applicable Psych Involvement: No (comment)  Admission diagnosis:  Hypercalcemia [E83.52] Patient Active Problem List  Diagnosis Date Noted  . Port-A-Cath in place 12/01/2019  . Hypercalcemia 11/29/2019  . Goals of care, counseling/discussion 11/28/2019  . Metastatic breast cancer ([place]) 11/28/2019  . Lumbar facet arthropathy 10/07/2015  . 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 Constipation due to opioid therapy  05/08/2015  . Genetic testing 03/17/2015  . Osteopenia determined by x-ray 03/13/2015  . Lumbar spondylosis 02/11/2015  . Lumbar radiculopathy 02/11/2015  . Chemotherapy-induced peripheral neuropathy ([place]) 02/11/2015  . Vitamin D deficiency 02/02/2015  . Breast cancer of upper-outer quadrant of left female breast ([place]) 12/22/2014  . High risk medication use 12/22/2014   PCP:  [name], MD Pharmacy:   [id number] - [place], [address] [place] [address] Phone: [phone] Fax: [phone]     Social Determinants of Health (SDOH) Interventions    Readmission Risk Interventions Readmission Risk Prevention Plan 12/02/2019  Transportation Screening Complete  HRI or [place] Complete  Social Work Consult for [place] Planning/Counseling Complete  Palliative Care Screening Not Applicable  Medication Review Press photographer) Complete  Some recent data might be hidden

## 2019-12-03 ENCOUNTER — Other Ambulatory Visit: Payer: Self-pay

## 2019-12-03 ENCOUNTER — Ambulatory Visit (HOSPITAL_COMMUNITY): Payer: Medicare Other

## 2019-12-03 ENCOUNTER — Ambulatory Visit (HOSPITAL_COMMUNITY): Payer: BC Managed Care – PPO

## 2019-12-03 ENCOUNTER — Other Ambulatory Visit (HOSPITAL_COMMUNITY): Payer: Medicare Other | Admitting: General Practice

## 2019-12-03 DIAGNOSIS — Z95828 Presence of other vascular implants and grafts: Secondary | ICD-10-CM

## 2019-12-03 DIAGNOSIS — Z7189 Other specified counseling: Secondary | ICD-10-CM

## 2019-12-03 DIAGNOSIS — R531 Weakness: Secondary | ICD-10-CM

## 2019-12-03 DIAGNOSIS — S12101D Unspecified nondisplaced fracture of second cervical vertebra, subsequent encounter for fracture with routine healing: Secondary | ICD-10-CM

## 2019-12-03 DIAGNOSIS — Z515 Encounter for palliative care: Secondary | ICD-10-CM

## 2019-12-03 DIAGNOSIS — C50919 Malignant neoplasm of unspecified site of unspecified female breast: Secondary | ICD-10-CM

## 2019-12-03 DIAGNOSIS — G893 Neoplasm related pain (acute) (chronic): Secondary | ICD-10-CM

## 2019-12-03 LAB — CBC
HCT: 29.8 % — ABNORMAL LOW (ref 36.0–46.0)
Hemoglobin: 9.1 g/dL — ABNORMAL LOW (ref 12.0–15.0)
MCH: 25.6 pg — ABNORMAL LOW (ref 26.0–34.0)
MCHC: 30.5 g/dL (ref 30.0–36.0)
MCV: 83.9 fL (ref 80.0–100.0)
Platelets: 329 10*3/uL (ref 150–400)
RBC: 3.55 MIL/uL — ABNORMAL LOW (ref 3.87–5.11)
RDW: 19.7 % — ABNORMAL HIGH (ref 11.5–15.5)
WBC: 8.7 10*3/uL (ref 4.0–10.5)
nRBC: 0.7 % — ABNORMAL HIGH (ref 0.0–0.2)

## 2019-12-03 LAB — BASIC METABOLIC PANEL
Anion gap: 9 (ref 5–15)
BUN: 17 mg/dL (ref 8–23)
CO2: 20 mmol/L — ABNORMAL LOW (ref 22–32)
Calcium: 8.1 mg/dL — ABNORMAL LOW (ref 8.9–10.3)
Chloride: 105 mmol/L (ref 98–111)
Creatinine, Ser: 0.78 mg/dL (ref 0.44–1.00)
GFR, Estimated: >60 mL/min (ref >60)
Glucose, Bld: 117 mg/dL — ABNORMAL HIGH (ref 70–99)
Potassium: 3.6 mmol/L (ref 3.5–5.1)
Sodium: 134 mmol/L — ABNORMAL LOW (ref 135–145)

## 2019-12-03 LAB — GLUCOSE, CAPILLARY
Glucose-Capillary: 102 mg/dL — ABNORMAL HIGH (ref 70–99)
Glucose-Capillary: 211 mg/dL — ABNORMAL HIGH (ref 70–99)
Glucose-Capillary: 127 mg/dL — ABNORMAL HIGH (ref 70–99)
Glucose-Capillary: 149 mg/dL — ABNORMAL HIGH (ref 70–99)

## 2019-12-03 NOTE — Progress Notes (Signed)
PROGRESS NOTE    Krista Doyle  XVQ:[id number] DOB: 11/01/1960 DOA: 11/29/2019 PCP: [name], MD    Brief Narrative:  Krista Doyle  is a 59 y.o. female, with past medical history of hypertension, hyperlipidemia, chemotherapy-induced neuropathy, diabetes mellitus, history of breast cancer visually diagnosed in 2008, status post radiation therapy and chemotherapy,  with recent diagnosis of metastatic disease to liver and osseous metastasis, patient was sent from oncology office for hypercalcemia, patient was recently diagnosed with lumbar/thoracic spine osseous metastasis, liver metastasis as well, patient work-up was significant for hypercalcemia at oncology office yesterday, where she received IV fluids and IV Zometa, today as well she received IV fluids and calcitonin in oncology office and sent to ED for further management, husband reports patient has more altered and confused over last 24 hours, as well has been weaker than her baseline, she has been reporting headache for 3 weeks, and has poor oral appetite, and oral intake, she has chronic nausea, but no vomiting. - in ED her calcium came<15, elevated at 1.3, baseline 0.8, potassium low at 3.3, MRI brain significant for 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   Assessment & Plan:   Active Problems:   Breast cancer of upper-outer quadrant of left female breast ([facility])   Lumbar radiculopathy   Chemotherapy-induced peripheral neuropathy (HCC)   Metastatic breast cancer (HCC)   Hypercalcemia   Weakness   Cancer related pain   Palliative care by specialist   Hypercalcemia -Patient admitted with severe hypercalcemia greater than 15 in the setting of metastatic breast cancer -She received Zometa prior to admission -She was also started on calcitonin -She was aggressively hydrated with IV fluids -Overall serum calcium has  normalized -PTH was in normal range, PTH related peptide in process  Metastatic breast cancer -Evidence of metastases to liver, bone, lung -MRI brain showed evidence of skull metastases.  Discussed with oncology and felt to be unlikely related to leptomeningeal spread or intracranial spread.  No midline shift or mass-effect on imaging -Plans are to follow-up with Dr. Delton Coombes for continued treatments -Palliative care consulted to help address goals of care/pain management -Currently, patient and family are processing her diagnosis and she was recently diagnosed with metastatic cancer within the past few weeks  Acute metabolic encephalopathy -Related to hypercalcemia -Mental status improving and appears to be back to baseline  Hypokalemia -Replace  Acute kidney injury -Creatinine of 0.8 at baseline -Peak creatinine up to 1.6 -This has since trended down with IV fluids  Insulin-dependent diabetes.   -On Lantus and sliding scale insulin -Continue to follow blood sugars -Blood sugars stable  Chronic pain syndrome secondary to underlying metastatic breast cancer -Currently on fentanyl patch and Dilaudid as needed -Reminded her that Dilaudid was ordered every 4 hours as needed -Continue with Lyrica and Robaxin -Palliative care following and assisting with pain management  Opioid-induced constipation -Continue with Movantik  Sinus tachycardia -Heart rate persistently in the 110s to 120s -D-dimer is elevated -CT angiogram of the chest performed was negative for pulmonary embolus -It did demonstrate widespread metastatic disease -Echocardiogram shows normal ejection fraction -Suspect that her tachycardia is related to pain -Continue pain management  Cervical spine fracture of C2 -Pathologic fracture in the setting of metastatic disease -Reviewed images with neurosurgery Dr. Zada Finders who did not recommend any surgical management at this time. -Recommendations were to place  the patient in a cervical collar with plans for outpatient neurosurgical follow-up -Activity  as tolerated -Seen by physical therapy with recommendations for skilled nursing facility placement  DVT prophylaxis: heparin injection 5,000 Units Start: 11/30/19 0600 SCDs Start: 11/30/19 0005  Code Status: Full code Family Communication: Discussed with husband at the bedside 11/2 Disposition Plan: Status is: Inpatient  Remains inpatient appropriate because:Hemodynamically unstable   Dispo: The patient is from: Home              Anticipated d/c is to: SNF              Anticipated d/c date is: 2 days              Patient currently is not medically stable to d/c.    Consultants:   Neurosurgery, Dr. Zada Finders (phone)  Palliative care  Procedures:     Antimicrobials:       Subjective: Patient is sitting up in chair today.  Reports that pain remains uncontrolled.  Denies any shortness of breath.  Objective: Vitals:   12/03/19 0436 12/03/19 0600 12/03/19 0700 12/03/19 0812  BP: 125/67     Pulse: (!) 101 (!) 103 (!) 106   Resp: 11 11 10    Temp: 98.6 F (37 C)     TempSrc:      SpO2: 97% 94% 97% 96%  Weight:        Intake/Output Summary (Last 24 hours) at 12/03/2019 1822 Last data filed at 12/03/2019 0500 Gross per 24 hour  Intake --  Output 400 ml  Net -400 ml   Filed Weights   11/30/19 0036 11/30/19 0444 12/01/19 0430  Weight: 51.2 kg 53.2 kg 51.8 kg    Examination:  General exam: Alert, awake, oriented x 3, c-collar in place Respiratory system: Clear to auscultation. Respiratory effort normal. Cardiovascular system:RRR. No murmurs, rubs, gallops. Gastrointestinal system: Abdomen is nondistended, soft and nontender. No organomegaly or masses felt. Normal bowel sounds heard. Central nervous system: Alert and oriented. No focal neurological deficits. Extremities: No C/C/E, +pedal pulses Skin: No rashes, lesions or ulcers Psychiatry: Judgement and insight appear  normal. Mood & affect appropriate.     Data Reviewed: I have personally reviewed following labs and imaging studies  CBC: Recent Labs  Lab 11/28/19 1302 11/29/19 1437 11/30/19 0457 12/01/19 0845 12/03/19 0420  WBC 9.5 6.3 5.8 9.6 8.7  NEUTROABS 5.5 5.5  --   --   --   HGB 10.7* 10.0* 8.6* 8.7* 9.1*  HCT 34.3* 33.1* 28.7* 27.9* 29.8*  MCV 83.3 85.3 85.9 82.5 83.9  PLT 449* 367 317 311 716   Basic Metabolic Panel: Recent Labs  Lab 11/30/19 0835 11/30/19 1227 12/01/19 0845 12/02/19 0518 12/03/19 0420  NA 135 136 133* 137 134*  K 3.3* 3.2* 3.1* 3.8 3.6  CL 106 106 102 106 105  CO2 19* 21* 22 21* 20*  GLUCOSE 84 120* 82 79 117*  BUN 13 12 10 12 17   CREATININE 0.78 0.72 0.62 0.65 0.78  CALCIUM 9.7 9.5 8.8* 8.3* 8.1*  MG  --   --   --  1.4*  --    GFR: Estimated Creatinine Clearance: 60.4 mL/min (by C-G formula based on SCr of 0.78 mg/dL). Liver Function Tests: Recent Labs  Lab 11/28/19 1302 11/29/19 1054 11/30/19 0457 12/01/19 0845  AST 53* 49* 39 49*  ALT 15 15 14 16   ALKPHOS 187* 172* 140* 162*  BILITOT 0.4 0.6 0.5 0.3  PROT 9.2* 8.6* 7.3 8.0  ALBUMIN 3.2* 3.1* 2.5* 2.7*   No results for input(s): LIPASE, AMYLASE  in the last 168 hours. No results for input(s): AMMONIA in the last 168 hours. Coagulation Profile: Recent Labs  Lab 11/29/19 1437 11/30/19 0457  INR 1.1 1.3*   Cardiac Enzymes: No results for input(s): CKTOTAL, CKMB, CKMBINDEX, TROPONINI in the last 168 hours. BNP (last 3 results) No results for input(s): PROBNP in the last 8760 hours. HbA1C: No results for input(s): HGBA1C in the last 72 hours. CBG: Recent Labs  Lab 12/02/19 1615 12/02/19 2208 12/03/19 0740 12/03/19 1107 12/03/19 1629  GLUCAP 93 123* 102* 211* 127*   Lipid Profile: No results for input(s): CHOL, HDL, LDLCALC, TRIG, CHOLHDL, LDLDIRECT in the last 72 hours. Thyroid Function Tests: Recent Labs    12/01/19 0845  FREET4 0.62   Anemia Panel: No results for  input(s): VITAMINB12, FOLATE, FERRITIN, TIBC, IRON, RETICCTPCT in the last 72 hours. Sepsis Labs: Recent Labs  Lab 11/29/19 1437 11/29/19 1629  LATICACIDVEN 1.6 1.6    Recent Results (from the past 240 hour(s))  Respiratory Panel by RT PCR (Flu A&B, Covid) - Nasopharyngeal Swab     Status: None   Collection Time: 11/29/19  2:05 PM   Specimen: Nasopharyngeal Swab  Result Value Ref Range Status   SARS Coronavirus 2 by RT PCR NEGATIVE NEGATIVE Final    Comment: (NOTE) SARS-CoV-2 target nucleic acids are NOT DETECTED.  The SARS-CoV-2 RNA is generally detectable in upper respiratoy specimens during the acute phase of infection. The lowest concentration of SARS-CoV-2 viral copies this assay can detect is 131 copies/mL. A negative result does not preclude SARS-Cov-2 infection and should not be used as the sole basis for treatment or other patient management decisions. A negative result may occur with  improper specimen collection/handling, submission of specimen other than nasopharyngeal swab, presence of viral mutation(s) within the areas targeted by this assay, and inadequate number of viral copies (<131 copies/mL). A negative result must be combined with clinical observations, patient history, and epidemiological information. The expected result is Negative.  Fact Sheet for Patients:  PinkCheek.be  Fact Sheet for Healthcare Providers:  GravelBags.it  This test is no t yet approved or cleared by the [place] FDA and  has been authorized for detection and/or diagnosis of SARS-CoV-2 by FDA under an Emergency Use Authorization (EUA). This EUA will remain  in effect (meaning this test can be used) for the duration of the COVID-19 declaration under Section 564(b)(1) of the Act, 21 U.S.C. section 360bbb-3(b)(1), unless the authorization is terminated or revoked sooner.     Influenza A by PCR NEGATIVE NEGATIVE Final    Influenza B by PCR NEGATIVE NEGATIVE Final    Comment: (NOTE) The Xpert Xpress SARS-CoV-2/FLU/RSV assay is intended as an aid in  the diagnosis of influenza from Nasopharyngeal swab specimens and  should not be used as a sole basis for treatment. Nasal washings and  aspirates are unacceptable for Xpert Xpress SARS-CoV-2/FLU/RSV  testing.  Fact Sheet for Patients: PinkCheek.be  Fact Sheet for Healthcare Providers: GravelBags.it  This test is not yet approved or cleared by the [place] FDA and  has been authorized for detection and/or diagnosis of SARS-CoV-2 by  FDA under an Emergency Use Authorization (EUA). This EUA will remain  in effect (meaning this test can be used) for the duration of the  Covid-19 declaration under Section 564(b)(1) of the Act, 21  U.S.C. section 360bbb-3(b)(1), unless the authorization is  terminated or revoked. Performed at [facility], [address] [place], [place] [address]   Blood culture (routine  single)     Status: None (Preliminary result)   Collection Time: 11/29/19  2:45 PM   Specimen: BLOOD  Result Value Ref Range Status   Specimen Description BLOOD  Final   Special Requests NONE  Final   Culture   Final    NO GROWTH 4 DAYS Performed at [facility], [address], [place], [place] [address]    Report Status PENDING  Incomplete  Urine culture     Status: None   Collection Time: 11/29/19  3:50 PM   Specimen: In/Out Cath Urine  Result Value Ref Range Status   Specimen Description   Final    IN/OUT CATH URINE Performed at [facility], [address] [place], [place] [address]    Special Requests   Final    NONE Performed at [facility], [address] [place], [place] [address]    Culture   Final    NO GROWTH Performed at [facility], [place] [address] [place], [place] [address]    Report Status 12/01/2019 FINAL  Final  MRSA PCR Screening     Status: Abnormal    Collection Time: 11/30/19 12:04 AM   Specimen: Nasopharyngeal  Result Value Ref Range Status   MRSA by PCR POSITIVE (A) NEGATIVE Final    Comment:        The GeneXpert MRSA Assay (FDA approved for NASAL specimens only), is one component of a comprehensive MRSA colonization surveillance program. It is not intended to diagnose MRSA infection nor to guide or monitor treatment for MRSA infections. RESULT CALLED TO, READ BACK BY AND VERIFIED WITH: ALSTON @ [id number] ON [id number] BY HENDERSON L. Performed at [facility], [address], [place], [place] [address]          Radiology Studies: CT SOFT TISSUE NECK W CONTRAST  Result Date: 12/01/2019 CLINICAL DATA:  Epiglottitis or tonsillitis suspected.  Neck pain. EXAM: CT NECK WITH CONTRAST TECHNIQUE: Multidetector CT imaging of the neck was performed using the standard protocol following the bolus administration of intravenous contrast. CONTRAST:  49mL OMNIPAQUE IOHEXOL 350 MG/ML SOLN COMPARISON:  11/29/2019 CT and MRI head.  Concurrent CTA chest. FINDINGS: Pharynx and larynx: Clear nasopharynx. Normal appearance of the epiglottis. Partial effacement of the left vallecula and piriform sinus, likely secondary to neck positioning. The vallecula and piriform sinuses are otherwise unremarkable. No vocal cord paralysis. Small retropharyngeal effusion spanning the C2-C5 levels. Salivary glands: No inflammation, mass, or stone. Thyroid: Normal. Lymph nodes: Diffusely prominent subcentimeter cervical nodes. Vascular: Normal intravascular enhancement. Limited intracranial: Please see recent CT and MRI head. Visualized orbits: Normal orbits. Mastoids and visualized paranasal sinuses: Pneumatized. Skeleton: Diffuse heterogeneity of the bone marrow predominantly involving the cervical spine at the C1-2 and C6-T1 levels. There is multilevel involvement of the posterior elements. Vertebral body heights are preserved. Perivertebral inflammatory changes most prominent  at the C2 level. Please see recent CT and MRI head for better evaluation of dominant clival and multifocal calvarial osseous metastases. Redemonstration of anterior left TMJ metastasis. Upper chest: Partially imaged pulmonary metastases. Please see concurrent CTA chest for additional findings below the thoracic inlet. Other: None. IMPRESSION: No evidence of epiglottitis or tonsillitis. Diffuse osseous metastases with prominent involvement of the cervical spine. Vertebral body heights are preserved however cannot exclude pending pathologic fracture. Perivertebral inflammatory changes most prominent at the atlantoaxial junction with small retropharyngeal effusion. Cannot exclude metastatic soft tissue involvement. MRI cervical spine is recommended for better evaluation. Partially imaged pulmonary metastases. Please see  concurrent CTA chest for additional findings below the thoracic inlet. These results will be called to the ordering clinician or representative by the Radiologist Assistant, and communication documented in the PACS or [employer]. Electronically Signed   By: Primitivo Gauze M.D.   On: 12/01/2019 19:30   CT ANGIO CHEST PE W OR WO CONTRAST  Result Date: 12/01/2019 CLINICAL DATA:  PE suspected, low/intermediate prob, positive D-dimer History of metastatic breast cancer. EXAM: CT ANGIOGRAPHY CHEST WITH CONTRAST TECHNIQUE: Multidetector CT imaging of the chest was performed using the standard protocol during bolus administration of intravenous contrast. Multiplanar CT image reconstructions and MIPs were obtained to evaluate the vascular anatomy. CONTRAST:  51mL OMNIPAQUE IOHEXOL 350 MG/ML SOLN COMPARISON:  Chest CT 11/12/2019, thoracic spine MRI 11/18/2019 FINDINGS: Cardiovascular: There are no filling defects within the pulmonary arteries to suggest pulmonary embolus. Left hilar lymph node is enlarging in causing increasing mass effect on lingular pulmonary artery but no evidence of invasion.  Common origin of brachiocephalic and left common carotid artery, variant arch anatomy. Extensive mural thickening with severe luminal narrowing involving the proximal left subclavian artery is unchanged from recent exam. There is no aortic dissection or acute aortic findings. The heart is normal in size. No pericardial effusion. Mediastinum/Nodes: Left hilar lymph node has slightly increased in size currently measuring 18 x 15 mm, previously 16 x 14 mm. Soft tissue mass in the epicardial fat along the cardiac apex measures 3.3 x 2.1 cm (series 5, image 68), previously 2.7 x 2.0 cm. No esophageal wall thickening. Previous left thyroid nodule is not well-defined on the current exam due to streak artifact in the right subclavian vein from dense IV contrast. Surgical clips in the left axilla. Previous small right axillary nodes are partially obscured by dense IV contrast. Lungs/Pleura: Extensive bilateral pulmonary nodules are again seen, both random and pleural/subpleural. Majority of these are stable from recent prior, however largest nodule currently measures 2.2 x 1.2 cm, series 7, image 51, previously 1.9 x 1.1 cm. A small right pleural effusion is new, with adjacent compressive atelectasis. No septal thickening or findings of pulmonary edema. Upper Abdomen: Known liver lesions on prior exam are less well-defined given phase of IV contrast. Musculoskeletal: Diffuse osseous metastatic disease throughout the thoracic osseous structures lateral left breast soft tissue nodularity measures 15 x 12 mm, series 5, image 49. With lytic and blastic osseous lesions. Callus formation about right anterior ribs fractures, also seen on previous. Review of the MIP images confirms the above findings. IMPRESSION: 1. No pulmonary embolus. 2. New small right pleural effusion with adjacent compressive atelectasis. 3. Extensive bilateral pulmonary nodules consistent with metastatic disease. Majority of these are stable from recent  prior, however largest pulmonary nodule has increased in size from prior. 4. Left hilar lymph node has slightly increased in size from recent prior. Soft tissue mass in the epicardial fat along the left cardiac apex has increased in size. 5. Diffuse osseous metastatic disease. 6. Left breast soft tissue nodularity laterally again seen. 7. Known liver lesions on prior exam are less well-defined on the current exam given phase of IV contrast. 8. Extensive mural thickening with severe luminal narrowing involving the proximal left subclavian artery is unchanged from recent prior. Electronically Signed   By: Keith Rake M.D.   On: 12/01/2019 19:20   MR CERVICAL SPINE W WO CONTRAST  Result Date: 12/02/2019 CLINICAL DATA:  Metastatic breast cancer with possible pathologic fracture in the cervical spine. EXAM: MRI CERVICAL SPINE WITHOUT  AND WITH CONTRAST TECHNIQUE: Multiplanar and multiecho pulse sequences of the cervical spine, to include the craniocervical junction and cervicothoracic junction, were obtained without and with intravenous contrast. CONTRAST:  47mL GADAVIST GADOBUTROL 1 MMOL/ML IV SOLN COMPARISON:  Neck CT 12/01/2019. Brain MRI 11/29/2019. Thoracic spine MRI 11/18/2019. FINDINGS: Alignment: Minimal retrolisthesis of C5 on C6 and C6 on C7. Vertebrae: Diffusely abnormal bone marrow signal throughout the cervical spine, included upper thoracic spine, and skull base consistent with known widespread osseous metastatic disease. Thin curvilinear region of low signal through the base of the dens suggestive of a nondisplaced fracture with possible extension more laterally in the C2 vertebral body. Extra osseous tumor extension about the C2 anterior and posterior elements. Cord: Normal signal. Posterior Fossa, vertebral arteries, paraspinal tissues: Preserved vertebral artery flow voids. Small prevertebral effusion. Previously described clival metastasis with overlying dural thickening extending into the upper  cervical spine. No masslike cervical epidural tumor. Disc levels: Cervical disc degeneration greatest at C5-6 where a broad-based posterior disc osteophyte complex and infolding of the ligamentum flavum result in mild-to-moderate spinal stenosis and mild-to-moderate right and severe left neural foraminal stenosis. Disc bulging and mild spurring at C6-7 resulting in moderate right and mild left neural foraminal stenosis without significant spinal stenosis. IMPRESSION: 1. Widespread osseous metastatic disease. 2. Suspected nondisplaced pathologic fracture involving the C2 vertebral body and base of dens. 3. 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 No masslike cervical dural thickening/epidural tumor. 4. Cervical disc degeneration, worst at C5-6 where there is mild-to-moderate spinal stenosis and severe left neural foraminal stenosis. These results will be called to the ordering clinician or representative by the Radiologist Assistant, and communication documented in the PACS or [employer]. Electronically Signed   By: Logan Bores M.D.   On: 12/02/2019 17:01   ECHOCARDIOGRAM COMPLETE  Result Date: 12/02/2019    ECHOCARDIOGRAM REPORT   Patient Name:   MIYA LUVIANO Date of Exam: 12/02/2019 Medical Rec #:  [id number]        Height:       63.0 in Accession #:    [id number]       Weight:       114.2 lb Date of Birth:  October 19, 1960       BSA:          1.524 m Patient Age:    1 years         BP:           131/68 mmHg Patient Gender: F                HR:           99 bpm. Exam Location:  [facility] Procedure: 2D Echo, Cardiac Doppler and Color Doppler Indications:    R94.31 Abnormal EKG; R00.0 Tachycardia  History:        Patient has no prior history of Echocardiogram examinations.                 Abnormal ECG. Breast cancer. Chemotherapy.  Sonographer:    Roseanna Rainbow RDCS (AE) Referring Phys: [phone] [facility] Willi Borowiak  Sonographer Comments:  Technically difficult study due to poor echo windows. Rib artifact. Attempted to turn. Images off-axis. IMPRESSIONS  1. Left ventricular ejection fraction, by estimation, is 60 to 65%. The left ventricle has normal function. The left ventricle has no regional wall motion abnormalities. Left ventricular diastolic parameters are consistent with Grade I diastolic dysfunction (impaired relaxation).  2. Right ventricular systolic function is normal. The right ventricular size is normal.  3. The mitral valve is normal in structure. No evidence of mitral valve regurgitation. No evidence of mitral stenosis.  4. The aortic valve is tricuspid. Aortic valve regurgitation is not visualized. No aortic stenosis is present.  5. The inferior vena cava is normal in size with greater than 50% respiratory variability, suggesting right atrial pressure of 3 mmHg. FINDINGS  Left Ventricle: Left ventricular ejection fraction, by estimation, is 60 to 65%. The left ventricle has normal function. The left ventricle has no regional wall motion abnormalities. The left ventricular internal cavity size was normal in size. There is  no left ventricular hypertrophy. Left ventricular diastolic parameters are consistent with Grade I diastolic dysfunction (impaired relaxation). Normal left ventricular filling pressure. Right Ventricle: The right ventricular size is normal. No increase in right ventricular wall thickness. Right ventricular systolic function is normal. Left Atrium: Left atrial size was normal in size. Right Atrium: Right atrial size was normal in size. Pericardium: There is no evidence of pericardial effusion. Mitral Valve: The mitral valve is normal in structure. No evidence of mitral valve regurgitation. No evidence of mitral valve stenosis. Tricuspid Valve: The tricuspid valve is normal in structure. Tricuspid valve regurgitation is not demonstrated. No evidence of tricuspid stenosis. Aortic Valve: The aortic valve is tricuspid.  Aortic valve regurgitation is not visualized. No aortic stenosis is present. Aortic valve mean gradient measures 3.1 mmHg. Aortic valve peak gradient measures 6.4 mmHg. Aortic valve area, by VTI measures 1.83 cm. Pulmonic Valve: The pulmonic valve was not well visualized. Pulmonic valve regurgitation is not visualized. No evidence of pulmonic stenosis. Aorta: The aortic root is normal in size and structure. Pulmonary Artery: Indeterminant PASP, inadequate TR jet. Venous: The inferior vena cava is normal in size with greater than 50% respiratory variability, suggesting right atrial pressure of 3 mmHg. IAS/Shunts: No atrial level shunt detected by color flow Doppler.  LEFT VENTRICLE PLAX 2D LVIDd:         3.36 cm     Diastology LVIDs:         2.16 cm     LV e' medial:   6.96 cm/s LV PW:         1.03 cm     LV E/e' medial: 12.4 LV IVS:        0.94 cm LVOT diam:     1.70 cm LV SV:         38 LV SV Index:   25 LVOT Area:     2.27 cm  LV Volumes (MOD) LV vol d, MOD A2C: 55.0 ml LV vol d, MOD A4C: 32.5 ml LV vol s, MOD A2C: 18.4 ml LV vol s, MOD A4C: 11.3 ml LV SV MOD A2C:     36.6 ml LV SV MOD A4C:     32.5 ml LV SV MOD BP:      32.0 ml RIGHT VENTRICLE RV S prime:     11.30 cm/s TAPSE (M-mode): 1.5 cm LEFT ATRIUM           Index      RIGHT ATRIUM          Index LA diam:      2.20 cm 1.44 cm/m RA Area:     7.17 cm LA Vol (A4C): 14.0 ml 9.19 ml/m RA Volume:   12.20 ml 8.01 ml/m  AORTIC VALVE AV Area (Vmax):    1.81 cm AV Area (Vmean):  1.59 cm AV Area (VTI):     1.83 cm AV Vmax:           126.43 cm/s AV Vmean:          81.029 cm/s AV VTI:            0.208 m AV Peak Grad:      6.4 mmHg AV Mean Grad:      3.1 mmHg LVOT Vmax:         101.00 cm/s LVOT Vmean:        56.600 cm/s LVOT VTI:          0.168 m LVOT/AV VTI ratio: 0.81  AORTA Ao Root diam: 2.80 cm Ao Asc diam:  3.20 cm MITRAL VALVE MV Area (PHT): 4.21 cm    SHUNTS MV Decel Time: 180 msec    Systemic VTI:  0.17 m MV E velocity: 86.50 cm/s  Systemic Diam: 1.70  cm MV A velocity: 90.80 cm/s MV E/A ratio:  0.95 Carlyle Dolly MD Electronically signed by Carlyle Dolly MD Signature Date/Time: 12/02/2019/3:28:22 PM    Final         Scheduled Meds: . amitriptyline  20 mg Oral QHS  . Chlorhexidine Gluconate Cloth  6 each Topical Daily  . feeding supplement  1 Container Oral TID BM  . fentaNYL  1 patch Transdermal Q72H  . heparin  5,000 Units Subcutaneous Q8H  . insulin aspart  0-5 Units Subcutaneous QHS  . insulin aspart  0-9 Units Subcutaneous TID WC  . insulin glargine  10 Units Subcutaneous QHS  . letrozole  2.5 mg Oral Daily  . magic mouthwash w/lidocaine  15 mL Oral TID  . megestrol  20 mg Oral BID  . metoprolol tartrate  25 mg Oral BID  . mometasone-formoterol  2 puff Inhalation BID  . mupirocin ointment   Nasal BID  . naloxegol oxalate  25 mg Oral Daily  . pregabalin  100 mg Oral BID  . senna  8.6 mg Oral Daily   Continuous Infusions:    LOS: 4 days    Time spent: 80mins    Kathie Dike, MD [facility]   If 7PM-7AM, please contact night-coverage [email or web address]  12/03/2019, 6:22 PM

## 2019-12-03 NOTE — [place] FL2 (Signed)
[place] MEDICAID FL2 LEVEL OF CARE SCREENING TOOL     IDENTIFICATION  Patient Name: Krista Doyle Birthdate: 10-26-1960 Sex: female Admission Date (Current Location): 11/29/2019  [facility] and [place] Number:  [employer] and Address:  [facility] [address], [place]      Provider Number: [id number]  Attending Physician Name and Address:  Kathie Dike, MD  Relative Name and Phone Number:       Current Level of Care: Hospital Recommended Level of Care: [place] Prior Approval Number:    Date Approved/Denied:   PASRR Number: [id number]  Discharge Plan: SNF    Current Diagnoses: Patient Active Problem List   Diagnosis Date Noted  . Weakness   . Cancer related pain   . Palliative care by specialist   . Port-A-Cath in place 12/01/2019  . Hypercalcemia 11/29/2019  . Goals of care, counseling/discussion 11/28/2019  . Metastatic breast cancer ([place]) 11/28/2019  . Lumbar facet arthropathy 10/07/2015  . 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 Constipation due to opioid therapy 05/08/2015  . Genetic testing 03/17/2015  . Osteopenia determined by x-ray 03/13/2015  . Lumbar spondylosis 02/11/2015  . Lumbar radiculopathy 02/11/2015  . Chemotherapy-induced peripheral neuropathy ([place]) 02/11/2015  . Vitamin D deficiency 02/02/2015  . Breast cancer of upper-outer quadrant of left female breast ([place]) 12/22/2014  . High risk medication use 12/22/2014    Orientation RESPIRATION BLADDER Height & Weight     Self, Time, Situation, Place  Normal Continent Weight: 114 lb 3.2 oz (51.8 kg) Height:     BEHAVIORAL SYMPTOMS/MOOD NEUROLOGICAL BOWEL NUTRITION STATUS      Continent Diet (see dc summary)  AMBULATORY STATUS COMMUNICATION OF NEEDS Skin   Extensive Assist Verbally Normal                       Personal Care Assistance Level of Assistance  Bathing, Feeding, Dressing Bathing Assistance: Limited  assistance Feeding assistance: Independent Dressing Assistance: Limited assistance     Functional Limitations Info  Sight, Hearing, Speech Sight Info: Adequate Hearing Info: Adequate Speech Info: Adequate    SPECIAL CARE FACTORS FREQUENCY  PT (By licensed PT), OT (By licensed OT)     PT Frequency: 5x week OT Frequency: 3x week            Contractures Contractures Info: Not present    Additional Factors Info  Code Status, Allergies Code Status Info: Full Allergies Info: Pollen extract, cat/dog dander           Current Medications (12/03/2019):  This is the current hospital active medication list Current Facility-Administered Medications  Medication Dose Route Frequency Provider Last Rate Last Admin  . albuterol (VENTOLIN HFA) 108 (90 Base) MCG/ACT inhaler 1 puff  1 puff Inhalation Q6H PRN Elgergawy, Silver Huguenin, MD      . amitriptyline (ELAVIL) tablet 20 mg  20 mg Oral QHS Elgergawy, Silver Huguenin, MD   20 mg at 12/02/19 2210  . bisacodyl (DULCOLAX) EC tablet 5 mg  5 mg Oral Daily PRN Elgergawy, Silver Huguenin, MD      . Chlorhexidine Gluconate Cloth 2 % PADS 6 each  6 each Topical Daily Elgergawy, Silver Huguenin, MD   6 each at 12/03/19 [phone]  . feeding supplement (BOOST / RESOURCE BREEZE) liquid 1 Container  1 Container Oral TID BM Kathie Dike, MD   1 Container at 12/03/19 1407  . fentaNYL (DURAGESIC) 50 MCG/HR 1 patch  1 patch  Transdermal Q72H Kathie Dike, MD   1 patch at 12/02/19 1402  . heparin injection 5,000 Units  5,000 Units Subcutaneous Q8H Elgergawy, Silver Huguenin, MD   5,000 Units at 12/03/19 0522  . hydrALAZINE (APRESOLINE) injection 5 mg  5 mg Intravenous Q6H PRN Elgergawy, Silver Huguenin, MD      . HYDROmorphone (DILAUDID) tablet 2-4 mg  2-4 mg Oral Q4H PRN Kathie Dike, MD   4 mg at 12/03/19 1303  . insulin aspart (novoLOG) injection 0-5 Units  0-5 Units Subcutaneous QHS Elgergawy, Dawood S, MD      . insulin aspart (novoLOG) injection 0-9 Units  0-9 Units Subcutaneous TID WC  Elgergawy, Silver Huguenin, MD   3 Units at 12/03/19 1220  . insulin glargine (LANTUS) injection 10 Units  10 Units Subcutaneous QHS Kathie Dike, MD   10 Units at 12/02/19 2211  . letrozole [facility]) tablet 2.5 mg  2.5 mg Oral Daily Elgergawy, Silver Huguenin, MD   2.5 mg at 12/03/19 0848  . magic mouthwash w/lidocaine  15 mL Oral TID Kathie Dike, MD   15 mL at 12/03/19 0835  . magnesium citrate solution 1 Bottle  1 Bottle Oral Once PRN Elgergawy, Silver Huguenin, MD      . megestrol (MEGACE) tablet 20 mg  20 mg Oral BID Elgergawy, Silver Huguenin, MD   20 mg at 12/03/19 0849  . methocarbamol (ROBAXIN) tablet 500 mg  500 mg Oral Q6H PRN Elgergawy, Silver Huguenin, MD      . metoprolol tartrate (LOPRESSOR) tablet 25 mg  25 mg Oral BID Kathie Dike, MD   25 mg at 12/03/19 0849  . mometasone-formoterol (DULERA) 200-5 MCG/ACT inhaler 2 puff  2 puff Inhalation BID Elgergawy, Silver Huguenin, MD   2 puff at 12/03/19 [phone]  . mupirocin ointment (BACTROBAN) 2 %   Nasal BID Kathie Dike, MD   Given at 12/03/19 0850  . naloxegol oxalate (MOVANTIK) tablet 25 mg  25 mg Oral Daily Elgergawy, Silver Huguenin, MD   25 mg at 12/02/19 0747  . ondansetron (ZOFRAN) tablet 4 mg  4 mg Oral Q6H PRN Elgergawy, Silver Huguenin, MD       Or  . ondansetron (ZOFRAN) injection 4 mg  4 mg Intravenous Q6H PRN Elgergawy, Silver Huguenin, MD   4 mg at 11/30/19 1019  . polyethylene glycol (MIRALAX / GLYCOLAX) packet 17 g  17 g Oral Daily PRN Elgergawy, Silver Huguenin, MD      . pregabalin (LYRICA) capsule 100 mg  100 mg Oral BID Elgergawy, Silver Huguenin, MD   100 mg at 12/03/19 0849  . senna (SENOKOT) tablet 8.6 mg  8.6 mg Oral Daily Elgergawy, Silver Huguenin, MD   8.6 mg at 12/02/19 0803     Discharge Medications: Please see discharge summary for a list of discharge medications.  Relevant Imaging Results:  Relevant Lab Results:   Additional Information SSN: [id number],

## 2019-12-03 NOTE — Plan of Care (Signed)
  Problem: Acute Rehab PT Goals(only PT should resolve) Goal: Pt Will Go Supine/Side To Sit Outcome: Progressing Flowsheets (Taken 12/03/2019 0921) Pt will go Supine/Side to Sit: with minimal assist Goal: Patient Will Transfer Sit To/From Stand Outcome: Progressing Flowsheets (Taken 12/03/2019 0921) Patient will transfer sit to/from stand:  with min guard assist  with minimal assist Goal: Pt Will Transfer Bed To Chair/Chair To Bed Outcome: Progressing Flowsheets (Taken 12/03/2019 0921) Pt will Transfer Bed to Chair/Chair to Bed: with min assist Goal: Pt Will Ambulate Outcome: Progressing Flowsheets (Taken 12/03/2019 0921) Pt will Ambulate:  with minimal assist  with moderate assist  25 feet  with rolling walker   9:22 AM, 12/03/19 Lonell Grandchild, MPT Physical Therapist with [facility] [phone] office [phone] mobile phone

## 2019-12-03 NOTE — Evaluation (Signed)
Physical Therapy Evaluation Patient Details Name: Krista Doyle MRN: [id number] DOB: March 22, 1960 Today's Date: 12/03/2019   History of Present Illness  Krista Doyle  is a 59 y.o. female, with past medical history of hypertension, hyperlipidemia, chemotherapy-induced neuropathy, diabetes mellitus, history of breast cancer visually diagnosed in 2008, status post radiation therapy and chemotherapy,  with recent diagnosis of metastatic disease to liver and osseous metastasis, patient was sent from oncology office for hypercalcemia, patient was recently diagnosed with lumbar/thoracic spine osseous metastasis, liver metastasis as well, patient work-up was significant for hypercalcemia at oncology office yesterday, where she received IV fluids and IV Zometa, today as well she received IV fluids and calcitonin in oncology office and sent to ED for further management, husband reports patient has more altered and confused over last 24 hours, as well has been weaker than her baseline, she has been reporting headache for 3 weeks, and has poor oral appetite, and oral intake, she has chronic nausea, but no vomiting.- in ED her calcium came<15, elevated at 1.3, baseline 0.8, potassium low at 3.3, MRI brain significant for 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    Clinical Impression  Patient demonstrates slow labored movement for bed mobility with poor return for attempting to supine to sit, had to log roll and sit up from side lying position due to increasing mid back pain.  Patient at high risk for falls and limited to a few slow labored side steps at bedside due to poor standing balance, fatigue and generalized weakness.  Patient tolerated sitting up in chair after therapy with her spouse present at bedside - RN aware.  Patient will benefit from continued physical therapy in hospital and recommended venue below to  increase strength, balance, endurance for safe ADLs and gait.     Follow Up Recommendations SNF    Equipment Recommendations       Recommendations for Other Services       Precautions / Restrictions Precautions Precautions: Fall Precaution Comments: Pathologic C2 fracture Restrictions Weight Bearing Restrictions: No      Mobility  Bed Mobility Overal bed mobility: Needs Assistance Bed Mobility: Rolling;Sidelying to Sit Rolling: Mod assist Sidelying to sit: Mod assist;Max assist       General bed mobility comments: slow labored movement, unable to safely attempt supine to sitting, had log roll on side and sit up from sidelying position to avoid increasing back pain    Transfers Overall transfer level: Needs assistance Equipment used: Rolling walker (2 wheeled) Transfers: Sit to/from [facility] Sit to Stand: Min assist;Mod assist Stand pivot transfers: Mod assist       General transfer comment: slow labored movement  Ambulation/Gait Ambulation/Gait assistance: Mod assist Gait Distance (Feet): 5 Feet Assistive device: Rolling walker (2 wheeled) Gait Pattern/deviations: Decreased step length - right;Decreased step length - left;Decreased stride length Gait velocity: decreased   General Gait Details: limited to 5-6 slow labored short side steps at bedside due to poor standing balance, generalized weakness  Stairs            Wheelchair Mobility    Modified Rankin (Stroke Patients Only)       Balance Overall balance assessment: Needs assistance Sitting-balance support: Feet supported;No upper extremity supported Sitting balance-Leahy Scale: Fair Sitting balance - Comments: seated at EOB   Standing balance support: During functional activity;Bilateral upper extremity supported Standing balance-Leahy Scale: Poor Standing balance comment: fair/poor using RW  Pertinent Vitals/Pain Pain  Assessment: 0-10 Pain Score: 8  Pain Location: mid back Pain Descriptors / Indicators: Sore;Discomfort;Aching Pain Intervention(s): Limited activity within patient's tolerance;Monitored during session;Repositioned    Home Living Family/patient expects to be discharged to:: Private residence Living Arrangements: Spouse/significant other Available Help at Discharge: Family;Available PRN/intermittently Type of Home: House Home Access: Ramped entrance     Home Layout: One level Home Equipment: Walker - 2 wheels;Cane - single point      Prior Function Level of Independence: Independent with assistive device(s)         Comments: hosehold ambulator using SPC     Hand Dominance        Extremity/Trunk Assessment   Upper Extremity Assessment Upper Extremity Assessment: Generalized weakness    Lower Extremity Assessment Lower Extremity Assessment: Generalized weakness    Cervical / Trunk Assessment Cervical / Trunk Assessment: Normal (wearing [place] collar)  Communication   Communication: No difficulties  Cognition Arousal/Alertness: Awake/alert Behavior During Therapy: WFL for tasks assessed/performed Overall Cognitive Status: Within Functional Limits for tasks assessed                                        General Comments      Exercises     Assessment/Plan    PT Assessment Patient needs continued PT services  PT Problem List Decreased strength;Decreased activity tolerance;Decreased balance;Decreased mobility       PT Treatment Interventions Balance training;Gait training;Stair training;Functional mobility training;Therapeutic activities;Therapeutic exercise;Patient/family education;DME instruction    PT Goals (Current goals can be found in the Care Plan section)  Acute Rehab PT Goals Patient Stated Goal: return home with family to assist PT Goal Formulation: With patient/family Time For Goal Achievement: 12/17/19 Potential to  Achieve Goals: Good    Frequency Min 3X/week   Barriers to discharge        Co-evaluation               AM-PAC PT "6 Clicks" Mobility  Outcome Measure Help needed turning from your back to your side while in a flat bed without using bedrails?: A Lot Help needed moving from lying on your back to sitting on the side of a flat bed without using bedrails?: A Lot Help needed moving to and from a bed to a chair (including a wheelchair)?: A Lot Help needed standing up from a chair using your arms (e.g., wheelchair or bedside chair)?: A Lot Help needed to walk in hospital room?: A Lot Help needed climbing 3-5 steps with a railing? : Total 6 Click Score: 11    End of Session   Activity Tolerance: Patient tolerated treatment well;Patient limited by fatigue Patient left: in chair;with call bell/phone within reach;with family/visitor present Nurse Communication: Mobility status PT Visit Diagnosis: Unsteadiness on feet (R26.81);Other abnormalities of gait and mobility (R26.89);Muscle weakness (generalized) (M62.81)    Time: 4401-0272 PT Time Calculation (min) (ACUTE ONLY): 32 min   Charges:   PT Evaluation $PT Eval High Complexity: 1 High PT Treatments $Therapeutic Activity: 23-37 mins        9:19 AM, 12/03/19 Lonell Grandchild, MPT Physical Therapist with [facility] 336 [phone] office [phone] mobile phone

## 2019-12-03 NOTE — Progress Notes (Signed)
Patient received soft cervical collar. Patient was educated that the collar was not supportive and did not provide stability like the [place] cervical collar did. Patient taught back what I educated them off. Patient and husband were present for education. The pt and the husband both said that they understood the teaching and had no questions about the soft cervical. Patient was given the paperwork for billing of the [place] cervical collar, [place] J collar, and the soft cervical collar. Pt requested husband to sign for the billing. Patient was given copy of ortho order form.

## 2019-12-03 NOTE — TOC Progression Note (Signed)
Transition of Care [facility]) - Progression Note    Patient Details  Name: MINSA WEDDINGTON MRN: [id number] Date of Birth: [birth year]  Transition of [facility]) CM/SW Contact  Shade Flood, LCSW Phone Number: 12/03/2019, 3:21 PM  Clinical Narrative:     TOC following. PT recommending SNF rehab at dc. Spoke with pt and her husband earlier today and again this afternoon to discuss dc planning. Pt now agreeable to referrals out for SNF. CMS provider options given to pt and her husband. Will refer to [facility]. Pt will need insurance authorization initiated by SNF once one is chosen.  TOC will follow.  Expected Discharge Plan: Skilled Nursing Facility Barriers to Discharge: Ship broker, Continued Medical Work up, SNF Pending bed offer  Expected Discharge Plan and Services Expected Discharge Plan: [place] In-house Referral: NA Discharge Planning Services: NA Post Acute Care Choice: Durable Medical Equipment Living arrangements for the past 2 months: Single Family Home                                       Social Determinants of Health (SDOH) Interventions    Readmission Risk Interventions Readmission Risk Prevention Plan 12/02/2019  Transportation Screening Complete  HRI or [place] Complete  Social Work Consult for [place] Planning/Counseling Complete  Palliative Care Screening Not Applicable  Medication Review [employer]) Complete  Some recent data might be hidden

## 2019-12-03 NOTE — Consult Note (Signed)
Consultation Note Date: 12/03/2019   Patient Name: Krista Doyle  DOB: 1960-05-30  MRN: [id number]  Age / Sex: 59 y.o., female  PCP: [name], MD Referring Physician: Kathie Dike, MD  Reason for Consultation: Establishing goals of care  HPI/Patient Profile: 59 y.o. 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 Plan is to follow up with Dr. Delton Coombes outpatient for initiation of chemotherapy. Patient also found to have cervical spine fracture of C2, neurosurgery recommending conservative management with c-collar. Palliative medicine consultation for goals of care.   Clinical Assessment and Goals of Care:  I have reviewed medical records, discussed with Dr. Roderic Palau and met with patient and husband at bedside to discuss goals of care. Patient is awake, alert and sitting up in recliner. She is able to participate in [facility] discussion but reports she has some forgetfulness and difficulty putting her words together therefore defers majority of questions to husband. Patient complains that c-collar is too big and uncomfortable. Discussed with RN to evaluate if there is a smaller c-collar we could try.   I introduced Palliative Medicine as specialized medical care for people living with serious illness. It focuses on providing relief from the symptoms and stress of a serious illness. The goal is to improve quality of life for both the patient and the family.  Discussed her journey with diagnosis of cancer in 2008 and  now recurrent cancer discovered just a few weeks ago. Patient and husband are still trying to process this information. Plan was to start chemotherapy soon but unfortunately she ended up admitted.   Discussed course of hospitalization including diagnoses, interventions, plan of care.   Attempted to elicit values and goals of care important to patient and husband. They acknowledge that her cancer is not curable but hopeful for more time with treatment. We discussed oncology interventions being palliative in nature. Encouraged ongoing discussions with her oncologist regarding expectations and prognosis.   Advanced directives, concepts specific to code status, artifical feeding and hydration were discussed. Patient does not have a documented living will. Introduced AD packet and MOST form. Encouraged early discussions regarding her goals/EOL wishes now while she is cognizant to make decisions. Discussed the terminal nature of her cancer and these decisions she is faced with in the future. Discussed the concept of limitations to care (DNR/DNI, allowing nature to take course when she reaches EOL) but also at this point, treating the treatable with IVF and ABX if indicated. Encouraged patient and husband to call PMT provider if questions about paperwork this admission and/or if they are interested in completing.   Patient and husband plan to follow up with Dr. Delton Coombes outpatient and hopeful for initiation of chemotherapy. They acknowledge  she is weak and may need SNF rehab. Patient/husband have not made decision on this yet.   Discussed symptom management. Fentanyl TD increased yesterday to 61mg. Also, patient was previously taking PO Percocet but inpatient, prescribed Dilaudid 2-441mPO q4h prn. Patient states better relief from PO Dilaudid. Movantik for constipation. [place] Ral[place] Ipill continue to manage her symptoms at cancer center but also  encouraged her to continue pain specialist follow-ups if able to make it to appointments ( ~1x month). Also discussed outpatient palliative referral.  Answered questions. [facility] and PMT contact information given.     SUMMARY OF RECOMMENDATIONS    Initial palliative discussion with patient and husband. No decisions made today.   Continue current plan of care and medical management.   Patient/husband plan to f/u with oncology outpatient and hopeful to begin chemotherapy. Patient/husband acknowledge her cancer is incurable. We discussed oncology interventions being palliative in nature.   Introduced AD packet, MOST form, and consideration of limitations to care with terminal condition. Encouraged early discussions of her goals and wishes with family.   May need SNF rehab. Patient/husband considering.   May benefit from outpatient palliative referral for ongoing GOL[place]iscussions.   Code Status/Advance Care Planning:  Full code  Symptom Management:   Fentanyl 50100mTD q72h  Continue Dilaudid 2-4mg40m q4h prn breakthrough pain  Movantik PO  Palliative Prophylaxis:   Bowel Regimen, Delirium Protocol, Frequent Pain Assessment, Oral Care and Turn Reposition   Psycho-social/Spiritual:   Desire for further Chaplaincy support:yes  Additional Recommendations: Caregiving  Support/Resources and Compassionate Wean Education  Prognosis:   Unable to determine  Discharge Planning: To Be Determined      Primary Diagnoses: Present on Admission: . Breast cancer of upper-outer quadrant of left female breast (HCC[place] . Metastatic breast cancer (HCC[place]   I have reviewed the medical record, interviewed the patient and family, and examined the patient. The following aspects are pertinent.  Past Medical History:  Diagnosis Date  . Asthma   . Back pain   . Breast cancer (HCC)[id number]   left  .  Diabetes (HCC)[place]. Hypercholesterolemia   . Hypertension   . 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 Personal history of chemotherapy   . Personal history of radiation therapy 2008  . Port-A-Cath in place 12/01/2019   Social History   Socioeconomic History  . Marital status: Married    Spouse name: Not on file  . Number of children: Not on file  . Years of education: Not on file  . Highest education level: Not on file  Occupational History  . Occupation: Disabled  Tobacco Use  . Smoking status: Never Smoker  . Smokeless tobacco: Never Used  Substance and Sexual Activity  . Alcohol use: No    Alcohol/week: 0.0 standard drinks  . Drug use: No  . Sexual activity: Yes  Other Topics Concern  . Not on file  Social History Narrative  . Not on file   Social Determinants of Health   Financial Resource Strain:   . Difficulty of Paying Living Expenses: Not on file  Food Insecurity:   . Worried About RunnCharity fundraiserthe Last Year: Not on file  . Ran Out of Food in the Last Year: Not on file  Transportation Needs:   . Lack of Transportation (Medical): Not on file  .  Lack of Transportation (Non-Medical): Not on file  Physical Activity:   . Days of Exercise per Week: Not on file  . Minutes of Exercise per Session: Not on file  Stress:   . Feeling of Stress : Not on file  Social Connections:   . Frequency of Communication with Friends and Family: Not on file  . Frequency of Social Gatherings with Friends and Family: Not on file  . Attends Religious Services: Not on file  . Active Member of Clubs or Organizations: Not on file  . Attends Archivist Meetings: Not on file  . Marital Status: Not on file   Family History  Problem Relation Age of Onset  . Colon cancer Neg Hx    Scheduled Meds: . amitriptyline  20 mg Oral QHS  . Chlorhexidine Gluconate Cloth  6 each Topical Daily  . feeding supplement  1 Container Oral TID BM  . fentaNYL  1 patch Transdermal  Q72H  . heparin  5,000 Units Subcutaneous Q8H  . insulin aspart  0-5 Units Subcutaneous QHS  . insulin aspart  0-9 Units Subcutaneous TID WC  . insulin glargine  10 Units Subcutaneous QHS  . letrozole  2.5 mg Oral Daily  . magic mouthwash w/lidocaine  15 mL Oral TID  . megestrol  20 mg Oral BID  . metoprolol tartrate  25 mg Oral BID  . mometasone-formoterol  2 puff Inhalation BID  . mupirocin ointment   Nasal BID  . naloxegol oxalate  25 mg Oral Daily  . pregabalin  100 mg Oral BID  . senna  8.6 mg Oral Daily   Continuous Infusions: PRN Meds:.albuterol, bisacodyl, hydrALAZINE, HYDROmorphone, magnesium citrate, methocarbamol, ondansetron **OR** ondansetron (ZOFRAN) IV, polyethylene glycol Medications Prior to Admission:  Prior to Admission medications   Medication Sig Start Date End Date Taking? Authorizing Provider  albuterol (PROVENTIL HFA;VENTOLIN HFA) 108 (90 Base) MCG/ACT inhaler Inhale 1 puff into the lungs every 6 (six) hours as needed for wheezing or shortness of breath.   Yes [provider]  amitriptyline (ELAVIL) 10 MG tablet 1-2 tablets at bedtime every day Patient taking differently: Take 10-20 mg by mouth at bedtime.  04/08/19  Yes Bayard Hugger, NP  atorvastatin (LIPITOR) 20 MG tablet Take 20 mg by mouth daily.   Yes [provider]  budesonide-formoterol (SYMBICORT) 160-4.5 MCG/ACT inhaler Inhale 2 puffs into the lungs 2 (two) times daily as needed (shortness of breath).    Yes [provider]  ergocalciferol (VITAMIN D2) 1.25 MG (50000 UT) capsule Take 1 capsule (50,000 Units total) by mouth once a week. Patient taking differently: Take 50,000 Units by mouth every Sunday.  03/28/19  Yes Lockamy, Randi L, NP-C  fentaNYL (DURAGESIC) 25 MCG/HR Place 1 patch onto the skin every 3 (three) days. 11/28/19  Yes Derek Jack, MD  Lactulose 20 GM/30ML SOLN Please take 30 ml every 3 hours until you produce a bowel movement. Then take 30 ml at  bedtime daily for constipation. 11/22/19  Yes Derek Jack, MD  LANTUS SOLOSTAR 100 UNIT/ML Solostar Pen Inject 24 Units into the skin at bedtime.  08/25/19  Yes [provider]  letrozole (FEMARA) 2.5 MG tablet Take 1 tablet (2.5 mg total) by mouth daily. 09/25/18  Yes Lockamy, Randi L, NP-C  loratadine (CLARITIN) 10 MG tablet Take 10 mg by mouth daily as needed for allergies.   Yes [provider]  losartan (COZAAR) 100 MG tablet Take 100 mg daily by mouth.   Yes  [provider]  meclizine (ANTIVERT) 25 MG tablet Take 25 mg by mouth 2 (two) times daily as needed for dizziness.  03/14/19  Yes [provider]  megestrol (MEGACE) 20 MG tablet Take 20 mg by mouth 2 (two) times daily.   Yes [provider]  Multiple Vitamins-Minerals (PRESERVISION AREDS 2) CAPS Take 1 capsule by mouth daily.   Yes [provider]  naloxegol oxalate (MOVANTIK) 25 MG TABS tablet Take 1 tablet (25 mg total) by mouth daily. 01/13/17  Yes Bayard Hugger, NP  ondansetron (ZOFRAN) 4 MG tablet Take 4 mg by mouth 2 (two) times daily.  10/29/19  Yes [provider]  oxyCODONE-acetaminophen (PERCOCET) 10-325 MG tablet Take 1 tablet by mouth every 6 (six) hours as needed for pain. 10/31/19  Yes Bayard Hugger, NP  BD PEN NEEDLE NANO 2ND GEN 32G X 4 MM MISC SMARTSIG:Injection Daily 09/19/19   [provider]  Blood Glucose Monitoring Suppl (ONETOUCH VERIO REFLECT) w/Device KIT USE TO TEST BLOOD SUGAR 2 TIMES A DAY 01/19/19   [provider]  calcium carbonate (OS-CAL - DOSED IN MG OF ELEMENTAL CALCIUM) 1250 (500 Ca) MG tablet Take 1,250 mg by mouth daily with breakfast.  Patient not taking: Reported on 11/29/2019    Patrici Ranks, MD  DOCEtaxel (TAXOTERE IV) Inject into the vein every 21 ( twenty-one) days. 12/03/19   [provider]  HYDROmorphone (DILAUDID) 2 MG tablet Take 1 tablet (2 mg total) by mouth every 4 (four) hours as  needed for severe pain. 11/28/19   Derek Jack, MD  Lancets (ONETOUCH DELICA PLUS [id number]) [place] USE TO TEST BLOOD SUGAR 2 TIMES A DAY 01/19/19   [provider]  lidocaine-prilocaine (EMLA) cream Apply a small amount to port a cath site and cover with plastic wrap 1 hour prior to chemotherapy appointments 12/01/19   Derek Jack, MD  methocarbamol (ROBAXIN) 500 MG tablet TAKE 1 TABLET BY MOUTH EVERY 6 HOURS AS NEEDED FOR MUSCLE SPASMS. Patient taking differently: Take 500 mg by mouth every 6 (six) hours as needed for muscle spasms.  06/04/19   Bayard Hugger, NP  [facility] VERIO test strip 1 each by Other route 2 (two) times daily.  01/19/19   [provider]  PERTUZUMAB IV Inject into the vein every 21 ( twenty-one) days. 12/03/19   [provider]  pregabalin (LYRICA) 100 MG capsule Take 1 capsule (100 mg total) by mouth 2 (two) times daily. Patient not taking: Reported on 11/29/2019 10/31/19   Bayard Hugger, NP  prochlorperazine (COMPAZINE) 10 MG tablet Take 1 tablet (10 mg total) by mouth every 6 (six) hours as needed (Nausea or vomiting). 12/01/19   Derek Jack, MD  senna (SENOKOT) 8.6 MG TABS tablet Take 8.6 mg by mouth daily.  Patient not taking: Reported on 11/29/2019    [provider]  TRASTUZUMAB IV Inject into the vein every 21 ( twenty-one) days. 12/03/19   [provider]   Allergies  Allergen Reactions  . Other     Cat/dog dander  . Pollen Extract    Review of Systems  Constitutional: Positive for activity change and unexpected weight change.       Cancer related pain  Neurological: Positive for weakness.    Physical Exam Vitals and nursing note reviewed.  Constitutional:      General: She is awake.     Appearance: She is cachectic. She is ill-appearing.  HENT:     Head:  Normocephalic and atraumatic.  Neck:     Comments: c-collar Cardiovascular:     Rate and Rhythm: Tachycardia present.   Pulmonary:     Effort: No tachypnea, accessory muscle usage or respiratory distress.  Skin:    General: Skin is warm and dry.  Neurological:     Mental Status: She is alert and oriented to person, place, and time.  Psychiatric:        Mood and Affect: Affect is flat.        Speech: Speech normal.        Cognition and Memory: Cognition normal.    Vital Signs: BP 125/67   Pulse (!) 106   Temp 98.6 F (37 C)   Resp 10   Wt 51.8 kg   SpO2 96%   BMI 20.23 kg/m  Pain Scale: 0-10 POSS *See Group Information*: 1-Acceptable,Awake and alert Pain Score: 8    SpO2: SpO2: 96 % O2 Device:SpO2: 96 % O2 Flow Rate: .   IO: Intake/output summary:   Intake/Output Summary (Last 24 hours) at 12/03/2019 1409 Last data filed at 12/03/2019 0500 Gross per 24 hour  Intake -  Output 400 ml  Net -400 ml    LBM: Last BM Date: 12/02/19 Baseline Weight: Weight: 49.4 kg Most recent weight: Weight: 51.8 kg     Palliative Assessment/Data: PPS 50%   Flowsheet Rows     Most Recent Value  Intake Tab  Referral Department Hospitalist  Unit at Time of Referral Intermediate Care Unit  Palliative Care Primary Diagnosis Cancer  Palliative Care Type New Palliative care  Reason for referral Clarify Goals of Care  Date first seen by Palliative Care 12/03/19  Clinical Assessment  Palliative Performance Scale Score 50%  Psychosocial & Spiritual Assessment  Palliative Care Outcomes  Patient/Family meeting held? Yes  Who was at the meeting? patient and husband  Palliative Care Outcomes Provided psychosocial or spiritual support, ACP counseling assistance, Linked to palliative care logitudinal support, Improved pain interventions, Improved non-pain symptom therapy, Clarified goals of care      Time Total: 60 Greater than 50%  of this time was spent counseling and coordinating care related to the above assessment and plan.  Signed by:  Ihor Dow, DNP, FNP-C Palliative Medicine Team  Phone:  [phone] Fax: [phone]   Please contact Palliative Medicine Team phone at [phone] for questions and concerns.  For individual provider: See Shea Evans

## 2019-12-03 NOTE — Progress Notes (Signed)
Patient was uncomfortable in Cervical Collar and the C-collar was removed by patient even after RNs attempted to make it more comfortable for patient.  Dr. Roderic Palau made aware and he placed order for soft C-Collar. Waiting for RN from ED to bring one to [place] to place on patient.

## 2019-12-04 ENCOUNTER — Ambulatory Visit: Payer: Medicare Other | Admitting: Registered Nurse

## 2019-12-04 LAB — CBC
HCT: 28 % — ABNORMAL LOW (ref 36.0–46.0)
Hemoglobin: 8.5 g/dL — ABNORMAL LOW (ref 12.0–15.0)
MCH: 25.7 pg — ABNORMAL LOW (ref 26.0–34.0)
MCHC: 30.4 g/dL (ref 30.0–36.0)
MCV: 84.6 fL (ref 80.0–100.0)
Platelets: 312 10*3/uL (ref 150–400)
RBC: 3.31 MIL/uL — ABNORMAL LOW (ref 3.87–5.11)
RDW: 19.9 % — ABNORMAL HIGH (ref 11.5–15.5)
WBC: 8.3 10*3/uL (ref 4.0–10.5)
nRBC: 1.1 % — ABNORMAL HIGH (ref 0.0–0.2)

## 2019-12-04 LAB — COMPREHENSIVE METABOLIC PANEL
ALT: 31 U/L (ref 0–44)
AST: 90 U/L — ABNORMAL HIGH (ref 15–41)
Albumin: 2.6 g/dL — ABNORMAL LOW (ref 3.5–5.0)
Alkaline Phosphatase: 175 U/L — ABNORMAL HIGH (ref 38–126)
Anion gap: 10 (ref 5–15)
BUN: 23 mg/dL (ref 8–23)
CO2: 20 mmol/L — ABNORMAL LOW (ref 22–32)
Calcium: 8.1 mg/dL — ABNORMAL LOW (ref 8.9–10.3)
Chloride: 103 mmol/L (ref 98–111)
Creatinine, Ser: 0.89 mg/dL (ref 0.44–1.00)
GFR, Estimated: >60 mL/min (ref >60)
Glucose, Bld: 135 mg/dL — ABNORMAL HIGH (ref 70–99)
Potassium: 3.4 mmol/L — ABNORMAL LOW (ref 3.5–5.1)
Sodium: 133 mmol/L — ABNORMAL LOW (ref 135–145)
Total Bilirubin: 0.3 mg/dL (ref 0.3–1.2)
Total Protein: 7.7 g/dL (ref 6.5–8.1)

## 2019-12-04 LAB — CULTURE, BLOOD (SINGLE): Culture: NO GROWTH

## 2019-12-04 LAB — GLUCOSE, CAPILLARY
Glucose-Capillary: 92 mg/dL (ref 70–99)
Glucose-Capillary: 162 mg/dL — ABNORMAL HIGH (ref 70–99)
Glucose-Capillary: 85 mg/dL (ref 70–99)
Glucose-Capillary: 94 mg/dL (ref 70–99)

## 2019-12-04 MED ORDER — POTASSIUM CHLORIDE IN NACL 20-0.9 MEQ/L-% IV SOLN: INTRAVENOUS | Status: DC

## 2019-12-04 MED ORDER — METOPROLOL TARTRATE 25 MG PO TABS
37.5000 mg | ORAL_TABLET | Freq: Two times a day (BID) | ORAL | Status: DC
  Administered 2019-12-04 – 2019-12-05 (×2): 37.5 mg via ORAL
  Filled 2019-12-04 (×2): qty 2

## 2019-12-04 MED ORDER — METOPROLOL TARTRATE 25 MG PO TABS
12.5000 mg | ORAL_TABLET | Freq: Once | ORAL | Status: AC
  Administered 2019-12-04: 12.5 mg via ORAL
  Filled 2019-12-04: qty 1

## 2019-12-04 NOTE — Progress Notes (Signed)
PROGRESS NOTE  Krista Doyle AYT:[id number] DOB: 1960-08-24 DOA: 11/29/2019 PCP: [name], MD  Brief History:  Krista Doyle a59 y.o.female,with past medical history of hypertension, hyperlipidemia, chemotherapy-induced neuropathy, diabetes mellitus, history of breast cancer visually diagnosed in 2008, status post radiation therapy and chemotherapy, with recent diagnosis of metastatic disease to liver and osseous metastasis, patient was sent from oncology office for hypercalcemia, patient was recently diagnosed with lumbar/thoracic spine osseous metastasis, liver metastasis as well, patient work-up was significant for hypercalcemia at oncology office yesterday, where she received IV fluids and IV Zometa, today as well she received IV fluids and calcitonin in oncology office and sent to ED for further management, husband reports patient has more altered and confused over last 24 hours, as well has been weaker than her baseline, she has been reporting headache for 3 weeks, and has poor oral appetite, and oral intake, she has chronic nausea, but no vomiting. - in EDher calcium came>15,elevated at 1.3, baseline 0.8, potassium low at 3.3, MRI brain significant for 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  Assessment/Plan: Hypercalcemia of malignancy -Patient admitted with severe hypercalcemia greater than 15 in the setting of metastatic breast cancer -She received Zometa prior to admission -She was also started on calcitonin -She was aggressively hydrated with IV fluids -Overall serum calcium has normalized -PTH was in normal range, PTH related peptide in process -continue IVF for now  Metastatic breast cancer -Evidence of metastases to liver, bone, lung -MRI brain showed evidence of skull metastases.  Discussed with oncology and felt to be unlikely related to leptomeningeal  spread or intracranial spread.  No midline shift or mass-effect on imaging -Plans are to follow-up with Dr. Delton Coombes for continued treatments -Palliative care consulted to help address goals of care/pain management--continue full scope of care -Currently, patient and family are processing her diagnosis and she was recently diagnosed with metastatic cancer within the past few weeks  Acute metabolic encephalopathy -Related to hypercalcemia -Mental status improving and appears to be back to baseline  Hypokalemia -Replace -check mag  Acute kidney injury -Creatinine of 0.8 at baseline -Peak creatinine up to 1.6 -This has since trended down with IV fluids  Insulin-dependent diabetes.   -On Lantus and sliding scale insulin -Continue to follow blood sugars -Blood sugars stable -11/29/19 A1C--6.7  Chronic pain syndrome secondary to underlying metastatic breast cancer  -Currently on fentanyl patch and Dilaudid as needed -Reminded her that Dilaudid was ordered every 4 hours as needed -Continue with Lyrica and Robaxin -Palliative care following and assisting with pain management  Opioid-induced constipation -Continue with Movantik  Sinus tachycardia -Heart rate persistently in the 110s to 120s -D-dimer is elevated -CT angiogram of the chest performed was negative for pulmonary embolus -It did demonstrate widespread metastatic disease -Echocardiogram shows normal ejection fraction -Suspect that her tachycardia is related to pain and volume depletion -Continue pain management -continue metoprolol--increase to 37.5 mg bid  Cervical spine fracture of C2 -Pathologic fracture in the setting of metastatic disease -Reviewed images with neurosurgery Dr. Zada Finders who did not recommend any surgical management at this time. -Recommendations were to place the patient in a cervical collar with plans for outpatient neurosurgical follow-up -Activity as tolerated -Seen by physical  therapy with recommendations for skilled nursing facility placement      Status is: Inpatient  Remains inpatient appropriate because:IV treatments appropriate due to intensity of illness or inability  to take PO   Dispo: The patient is from: Home              Anticipated d/c is to: SNF              Anticipated d/c date is: 1 day              Patient currently is not medically stable to d/c.        Family Communication:  Spouse updated at bedside 11/3  Consultants:  none  Code Status:  FULL   DVT Prophylaxis:  [place] Heparin    Procedures: As Listed in Progress Note Above  Antibiotics: None       Subjective: Patient complains of right shoulder and right leg pain which have been chronic.  Denies cp, sob, n/v/d, abd pain   Objective: Vitals:   12/03/19 2000 12/04/19 0000 12/04/19 0400 12/04/19 0828  BP: 120/70 (!) 156/88 140/77   Pulse:      Resp: (!) 8 17 20    Temp: 98.2 F (36.8 C) 98.1 F (36.7 C) 98.1 F (36.7 C) 98.8 F (37.1 C)  TempSrc: Oral Oral Oral Oral  SpO2:    95%  Weight:        Intake/Output Summary (Last 24 hours) at 12/04/2019 1153 Last data filed at 12/04/2019 0400 Gross per 24 hour  Intake 240 ml  Output --  Net 240 ml   Weight change:  Exam:   General:  Pt is alert, follows commands appropriately, not in acute distress  HEENT: No icterus, No thrush, No neck mass, [place]/AT  Cardiovascular: RRR, S1/S2, no rubs, no gallops  Respiratory: bibasilar crackles. No wheeze  Abdomen: Soft/+BS, non tender, non distended, no guarding  Extremities: No edema, No lymphangitis, No petechiae, No rashes, no synovitis   Data Reviewed: I have personally reviewed following labs and imaging studies Basic Metabolic Panel: Recent Labs  Lab 11/30/19 1227 12/01/19 0845 12/02/19 0518 12/03/19 0420 12/04/19 0920  NA 136 133* 137 134* 133*  K 3.2* 3.1* 3.8 3.6 3.4*  CL 106 102 106 105 103  CO2 21* 22 21* 20* 20*  GLUCOSE 120* 82 79 117* 135*   BUN 12 10 12 17 23   CREATININE 0.72 0.62 0.65 0.78 0.89  CALCIUM 9.5 8.8* 8.3* 8.1* 8.1*  MG  --   --  1.4*  --   --    Liver Function Tests: Recent Labs  Lab 11/28/19 1302 11/29/19 1054 11/30/19 0457 12/01/19 0845 12/04/19 0920  AST 53* 49* 39 49* 90*  ALT 15 15 14 16 31   ALKPHOS 187* 172* 140* 162* 175*  BILITOT 0.4 0.6 0.5 0.3 0.3  PROT 9.2* 8.6* 7.3 8.0 7.7  ALBUMIN 3.2* 3.1* 2.5* 2.7* 2.6*   No results for input(s): LIPASE, AMYLASE in the last 168 hours. No results for input(s): AMMONIA in the last 168 hours. Coagulation Profile: Recent Labs  Lab 11/29/19 1437 11/30/19 0457  INR 1.1 1.3*   CBC: Recent Labs  Lab 11/28/19 1302 11/28/19 1302 11/29/19 1437 11/30/19 0457 12/01/19 0845 12/03/19 0420 12/04/19 0920  WBC 9.5   < > 6.3 5.8 9.6 8.7 8.3  NEUTROABS 5.5  --  5.5  --   --   --   --   HGB 10.7*   < > 10.0* 8.6* 8.7* 9.1* 8.5*  HCT 34.3*   < > 33.1* 28.7* 27.9* 29.8* 28.0*  MCV 83.3   < > 85.3 85.9 82.5 83.9 84.6  PLT 449*   < >  367 317 311 329 312   < > = values in this interval not displayed.   Cardiac Enzymes: No results for input(s): CKTOTAL, CKMB, CKMBINDEX, TROPONINI in the last 168 hours. BNP: Invalid input(s): POCBNP CBG: Recent Labs  Lab 12/03/19 1107 12/03/19 1629 12/03/19 2217 12/04/19 0818 12/04/19 1134  GLUCAP 211* 127* 149* 92 162*   HbA1C: No results for input(s): HGBA1C in the last 72 hours. Urine analysis:    Component Value Date/Time   COLORURINE STRAW (A) 11/29/2019 [place] 11/29/2019 1550   LABSPEC 1.008 11/29/2019 1550   PHURINE 7.0 11/29/2019 1550   GLUCOSEU NEGATIVE 11/29/2019 1550   HGBUR NEGATIVE 11/29/2019 1550   BILIRUBINUR NEGATIVE 11/29/2019 1550   KETONESUR NEGATIVE 11/29/2019 1550   PROTEINUR NEGATIVE 11/29/2019 1550   NITRITE NEGATIVE 11/29/2019 1550   LEUKOCYTESUR NEGATIVE 11/29/2019 1550   Sepsis Labs: @LABRCNTIP (procalcitonin:4,lacticidven:4) ) Recent Results (from the past 240  hour(s))  Respiratory Panel by RT PCR (Flu A&B, Covid) - Nasopharyngeal Swab     Status: None   Collection Time: 11/29/19  2:05 PM   Specimen: Nasopharyngeal Swab  Result Value Ref Range Status   SARS Coronavirus 2 by RT PCR NEGATIVE NEGATIVE Final    Comment: (NOTE) SARS-CoV-2 target nucleic acids are NOT DETECTED.  The SARS-CoV-2 RNA is generally detectable in upper respiratoy specimens during the acute phase of infection. The lowest concentration of SARS-CoV-2 viral copies this assay can detect is 131 copies/mL. A negative result does not preclude SARS-Cov-2 infection and should not be used as the sole basis for treatment or other patient management decisions. A negative result may occur with  improper specimen collection/handling, submission of specimen other than nasopharyngeal swab, presence of viral mutation(s) within the areas targeted by this assay, and inadequate number of viral copies (<131 copies/mL). A negative result must be combined with clinical observations, patient history, and epidemiological information. The expected result is Negative.  Fact Sheet for Patients:  PinkCheek.be  Fact Sheet for Healthcare Providers:  GravelBags.it  This test is no t yet approved or cleared by the [place] FDA and  has been authorized for detection and/or diagnosis of SARS-CoV-2 by FDA under an Emergency Use Authorization (EUA). This EUA will remain  in effect (meaning this test can be used) for the duration of the COVID-19 declaration under Section 564(b)(1) of the Act, 21 U.S.C. section 360bbb-3(b)(1), unless the authorization is terminated or revoked sooner.     Influenza A by PCR NEGATIVE NEGATIVE Final   Influenza B by PCR NEGATIVE NEGATIVE Final    Comment: (NOTE) The Xpert Xpress SARS-CoV-2/FLU/RSV assay is intended as an aid in  the diagnosis of influenza from Nasopharyngeal swab specimens and  should not  be used as a sole basis for treatment. Nasal washings and  aspirates are unacceptable for Xpert Xpress SARS-CoV-2/FLU/RSV  testing.  Fact Sheet for Patients: PinkCheek.be  Fact Sheet for Healthcare Providers: GravelBags.it  This test is not yet approved or cleared by the [place] FDA and  has been authorized for detection and/or diagnosis of SARS-CoV-2 by  FDA under an Emergency Use Authorization (EUA). This EUA will remain  in effect (meaning this test can be used) for the duration of the  Covid-19 declaration under Section 564(b)(1) of the Act, 21  U.S.C. section 360bbb-3(b)(1), unless the authorization is  terminated or revoked. Performed at [facility], [address], [place], [place] [address]   Blood culture (routine single)     Status: None  Collection Time: 11/29/19  2:45 PM   Specimen: BLOOD  Result Value Ref Range Status   Specimen Description BLOOD  Final   Special Requests NONE  Final   Culture   Final    NO GROWTH 5 DAYS Performed at [facility], [address], [place], [place] [address]    Report Status 12/04/2019 FINAL  Final  Urine culture     Status: None   Collection Time: 11/29/19  3:50 PM   Specimen: In/Out Cath Urine  Result Value Ref Range Status   Specimen Description   Final    IN/OUT CATH URINE Performed at [facility], [address] [place], [place] [address]    Special Requests   Final    NONE Performed at [facility], [address], [place], [place] [address]    Culture   Final    NO GROWTH Performed at [facility], [place] [address], [place], [place] [address]    Report Status 12/01/2019 FINAL  Final  MRSA PCR Screening     Status: Abnormal   Collection Time: 11/30/19 12:04 AM   Specimen: Nasopharyngeal  Result Value Ref Range Status   MRSA by PCR POSITIVE (A) NEGATIVE Final    Comment:        The GeneXpert MRSA Assay (FDA approved for NASAL specimens only), is  one component of a comprehensive MRSA colonization surveillance program. It is not intended to diagnose MRSA infection nor to guide or monitor treatment for MRSA infections. RESULT CALLED TO, READ BACK BY AND VERIFIED WITH: ALSTON @ [id number] ON [id number] BY HENDERSON L. Performed at [facility], [address], [place],  [address]      Scheduled Meds: . amitriptyline  20 mg Oral QHS  . Chlorhexidine Gluconate Cloth  6 each Topical Daily  . feeding supplement  1 Container Oral TID BM  . fentaNYL  1 patch Transdermal Q72H  . heparin  5,000 Units Subcutaneous Q8H  . insulin aspart  0-5 Units Subcutaneous QHS  . insulin aspart  0-9 Units Subcutaneous TID WC  . insulin glargine  10 Units Subcutaneous QHS  . letrozole  2.5 mg Oral Daily  . magic mouthwash w/lidocaine  15 mL Oral TID  . megestrol  20 mg Oral BID  . metoprolol tartrate  25 mg Oral BID  . mometasone-formoterol  2 puff Inhalation BID  . mupirocin ointment   Nasal BID  . naloxegol oxalate  25 mg Oral Daily  . pregabalin  100 mg Oral BID  . senna  8.6 mg Oral Daily   Continuous Infusions:  Procedures/Studies: CT Head Wo Contrast  Result Date: 11/29/2019 CLINICAL DATA:  [place] EXAM: CT HEAD WITHOUT CONTRAST TECHNIQUE: Contiguous axial images were obtained from the base of the skull through the vertex without intravenous contrast. COMPARISON:  None. FINDINGS: Brain: No acute infarct or intracranial hemorrhage. No mass lesion. No midline shift, ventriculomegaly or extra-axial fluid collection. Vascular: No hyperdense vessel or unexpected calcification. Carotid siphon atherosclerotic calcifications. Skull: Mottled, heterogenous appearance of the bifrontal, right parietal calvarium and clivus. There is a 1.6 cm right calvarial soft tissue focus with dehiscence of the bone (2:18). Sinuses/Orbits: Normal orbits. Clear paranasal sinuses. No mastoid effusion. Other: Asymmetric prominence of the right temporalis muscle (2:12).  IMPRESSION: No acute infarct or intracranial hemorrhage. Right frontal calvarial soft tissue, asymmetric prominence of the right temporalis muscle and calvarial/clivus heterogeneity is concerning for metastases. MRI head with and without contrast is recommended for further evaluation. Electronically Signed  By: Primitivo Gauze M.D.   On: 11/29/2019 16:37   CT SOFT TISSUE NECK W CONTRAST  Result Date: 12/01/2019 CLINICAL DATA:  Epiglottitis or tonsillitis suspected.  Neck pain. EXAM: CT NECK WITH CONTRAST TECHNIQUE: Multidetector CT imaging of the neck was performed using the standard protocol following the bolus administration of intravenous contrast. CONTRAST:  57mL OMNIPAQUE IOHEXOL 350 MG/ML SOLN COMPARISON:  11/29/2019 CT and MRI head.  Concurrent CTA chest. FINDINGS: Pharynx and larynx: Clear nasopharynx. Normal appearance of the epiglottis. Partial effacement of the left vallecula and piriform sinus, likely secondary to neck positioning. The vallecula and piriform sinuses are otherwise unremarkable. No vocal cord paralysis. Small retropharyngeal effusion spanning the C2-C5 levels. Salivary glands: No inflammation, mass, or stone. Thyroid: Normal. Lymph nodes: Diffusely prominent subcentimeter cervical nodes. Vascular: Normal intravascular enhancement. Limited intracranial: Please see recent CT and MRI head. Visualized orbits: Normal orbits. Mastoids and visualized paranasal sinuses: Pneumatized. Skeleton: Diffuse heterogeneity of the bone marrow predominantly involving the cervical spine at the C1-2 and C6-T1 levels. There is multilevel involvement of the posterior elements. Vertebral body heights are preserved. Perivertebral inflammatory changes most prominent at the C2 level. Please see recent CT and MRI head for better evaluation of dominant clival and multifocal calvarial osseous metastases. Redemonstration of anterior left TMJ metastasis. Upper chest: Partially imaged pulmonary metastases.  Please see concurrent CTA chest for additional findings below the thoracic inlet. Other: None. IMPRESSION: No evidence of epiglottitis or tonsillitis. Diffuse osseous metastases with prominent involvement of the cervical spine. Vertebral body heights are preserved however cannot exclude pending pathologic fracture. Perivertebral inflammatory changes most prominent at the atlantoaxial junction with small retropharyngeal effusion. Cannot exclude metastatic soft tissue involvement. MRI cervical spine is recommended for better evaluation. Partially imaged pulmonary metastases. Please see concurrent CTA chest for additional findings below the thoracic inlet. These results will be called to the ordering clinician or representative by the Radiologist Assistant, and communication documented in the PACS or [employer]. Electronically Signed   By: Primitivo Gauze M.D.   On: 12/01/2019 19:30   CT Chest W Contrast  Result Date: 11/13/2019 CLINICAL DATA:  Breast cancer, recent weight loss and decreased appetite. Liver lesions on recent CT abdomen pelvis. EXAM: CT CHEST WITH CONTRAST TECHNIQUE: Multidetector CT imaging of the chest was performed during intravenous contrast administration. CONTRAST:  68mL OMNIPAQUE IOHEXOL 300 MG/ML  SOLN COMPARISON:  CT abdomen pelvis 11/02/2019, MR lumbar spine 10/24/2019. FINDINGS: Cardiovascular: Extensive mural thickening and severe luminal narrowing involving the left subclavian artery. Heart size normal. No pericardial effusion. Mediastinum/Nodes: Mildly hypodense left thyroid nodule measures 1.3 cm. No follow-up recommended (ref: J Am Coll Radiol. 2015 Feb;12(2): 143-50).No pathologically enlarged mediastinal lymph nodes. Left hilar lymph node measures 1.4 x 1.6 cm. Right axillary lymph nodes measure up to 8 mm. Surgical clips in the left axilla. Soft tissue mass in the epicardial fat along the cardiac apex measures 2.0 x 2.7 cm (2/102). Esophagus is grossly unremarkable.  Lungs/Pleura: Diffuse perilymphatic nodularity bilaterally. Extensive pleural/subpleural nodularity in the left hemithorax. Index subpleural nodule in the anterior left upper lobe measures 1.1 x 1.9 cm (4/50). No pleural fluid. Airway is unremarkable. Upper Abdomen: Multiple heterogeneous thick-walled lesions throughout the liver, seen in their entirety on 11/02/2019. Index lesion in the dome of the liver measures 1.9 x 2.1 cm (2/107). Visualized portion of the gallbladder is unremarkable. Slight nodular thickening of both adrenal glands. Subcentimeter low-attenuation lesion in the left kidney is likely a cyst. Visualized portions of the  kidneys, spleen, pancreas, stomach and bowel are grossly unremarkable. Periportal lymph nodes measure up to 11 mm. Gastrohepatic ligament lymph nodes, 5 mm. Musculoskeletal: Mottled sclerotic metastases are seen throughout the visualized osseous structures. Healing or healed pathologic right rib fractures. Pathologic superior endplate compression fracture involving T12. Nodular soft tissue in the lateral left breast measures 1.1 x 1.7 cm (2/60). IMPRESSION: 1. Widespread pleuroparenchymal, left hilar, hepatic and osseous metastatic disease. Lateral left breast mass may be postoperative in etiology. Disease recurrence cannot be excluded. 2. Extensive mural thickening and severe luminal narrowing involving the left subclavian artery. Electronically Signed   By: Lorin Picket M.D.   On: 11/13/2019 09:54   CT ANGIO CHEST PE W OR WO CONTRAST  Result Date: 12/01/2019 CLINICAL DATA:  PE suspected, low/intermediate prob, positive D-dimer History of metastatic breast cancer. EXAM: CT ANGIOGRAPHY CHEST WITH CONTRAST TECHNIQUE: Multidetector CT imaging of the chest was performed using the standard protocol during bolus administration of intravenous contrast. Multiplanar CT image reconstructions and MIPs were obtained to evaluate the vascular anatomy. CONTRAST:  81mL OMNIPAQUE IOHEXOL  350 MG/ML SOLN COMPARISON:  Chest CT 11/12/2019, thoracic spine MRI 11/18/2019 FINDINGS: Cardiovascular: There are no filling defects within the pulmonary arteries to suggest pulmonary embolus. Left hilar lymph node is enlarging in causing increasing mass effect on lingular pulmonary artery but no evidence of invasion. Common origin of brachiocephalic and left common carotid artery, variant arch anatomy. Extensive mural thickening with severe luminal narrowing involving the proximal left subclavian artery is unchanged from recent exam. There is no aortic dissection or acute aortic findings. The heart is normal in size. No pericardial effusion. Mediastinum/Nodes: Left hilar lymph node has slightly increased in size currently measuring 18 x 15 mm, previously 16 x 14 mm. Soft tissue mass in the epicardial fat along the cardiac apex measures 3.3 x 2.1 cm (series 5, image 68), previously 2.7 x 2.0 cm. No esophageal wall thickening. Previous left thyroid nodule is not well-defined on the current exam due to streak artifact in the right subclavian vein from dense IV contrast. Surgical clips in the left axilla. Previous small right axillary nodes are partially obscured by dense IV contrast. Lungs/Pleura: Extensive bilateral pulmonary nodules are again seen, both random and pleural/subpleural. Majority of these are stable from recent prior, however largest nodule currently measures 2.2 x 1.2 cm, series 7, image 51, previously 1.9 x 1.1 cm. A small right pleural effusion is new, with adjacent compressive atelectasis. No septal thickening or findings of pulmonary edema. Upper Abdomen: Known liver lesions on prior exam are less well-defined given phase of IV contrast. Musculoskeletal: Diffuse osseous metastatic disease throughout the thoracic osseous structures lateral left breast soft tissue nodularity measures 15 x 12 mm, series 5, image 49. With lytic and blastic osseous lesions. Callus formation about right anterior ribs  fractures, also seen on previous. Review of the MIP images confirms the above findings. IMPRESSION: 1. No pulmonary embolus. 2. New small right pleural effusion with adjacent compressive atelectasis. 3. Extensive bilateral pulmonary nodules consistent with metastatic disease. Majority of these are stable from recent prior, however largest pulmonary nodule has increased in size from prior. 4. Left hilar lymph node has slightly increased in size from recent prior. Soft tissue mass in the epicardial fat along the left cardiac apex has increased in size. 5. Diffuse osseous metastatic disease. 6. Left breast soft tissue nodularity laterally again seen. 7. Known liver lesions on prior exam are less well-defined on the current exam given phase of  IV contrast. 8. Extensive mural thickening with severe luminal narrowing involving the proximal left subclavian artery is unchanged from recent prior. Electronically Signed   By: Keith Rake M.D.   On: 12/01/2019 19:20   MR Brain W and Wo Contrast  Result Date: 11/29/2019 CLINICAL DATA:  Dizziness, nonspecific.  Brain mass or lesion. EXAM: MRI HEAD WITHOUT AND WITH CONTRAST TECHNIQUE: Multiplanar, multiecho pulse sequences of the brain and surrounding structures were obtained without and with intravenous contrast. CONTRAST:  24mL GADAVIST GADOBUTROL 1 MMOL/ML IV SOLN COMPARISON:  11/29/2019 head CT. FINDINGS: Brain: No acute infarct. No acute intracranial hemorrhage. No midline shift, ventriculomegaly or extra-axial fluid collection. Diffuse dural thickening overlying the right cerebral convexity. Focal dural thickening overlying the left parietal lobe (18:117). 1.5 x 0.5 cm right dural-based lesion overlying the temporal convexity (18:67). There is also dural thickening along the clivus. Vascular: Preserved major intracranial flow voids. Skull and upper cervical spine: Multifocal T2 hyperintense enhancing calvarial lesions reflect osseous metastases. Enhancing 1.8 cm  right frontal calvarial soft tissue (15:35) with dehiscence of the overlying cortex. 2.6 x 1.8 cm clival metastasis (15:10). Smaller enhancing osseous lesions are also seen involving the bilateral sphenoid wings and occipital condyles. Sinuses/Orbits: Normal orbits clear paranasal sinuses. Trace right mastoid effusion. Other: 1.4 x 0.8 cm enhancing soft tissue involving the left TMJ (18:40). Enhancing soft tissue infiltration involving the right temporalis muscle which is asymmetrically thickened (18:94). IMPRESSION: Dural-based metastatic disease demonstrating right predominance. Focal plaque-like dural lesions measuring up to 1.5 x 0.5 cm overlie the right temporal and left parietal convexities. Multifocal osseous metastases with dominant lesions involving the clivus, right frontal calvarium and left TMJ. Metastatic involvement of the right temporalis muscle. Electronically Signed   By: Primitivo Gauze M.D.   On: 11/29/2019 19:25   MR CERVICAL SPINE W WO CONTRAST  Result Date: 12/02/2019 CLINICAL DATA:  Metastatic breast cancer with possible pathologic fracture in the cervical spine. EXAM: MRI CERVICAL SPINE WITHOUT AND WITH CONTRAST TECHNIQUE: Multiplanar and multiecho pulse sequences of the cervical spine, to include the craniocervical junction and cervicothoracic junction, were obtained without and with intravenous contrast. CONTRAST:  22mL GADAVIST GADOBUTROL 1 MMOL/ML IV SOLN COMPARISON:  Neck CT 12/01/2019. Brain MRI 11/29/2019. Thoracic spine MRI 11/18/2019. FINDINGS: Alignment: Minimal retrolisthesis of C5 on C6 and C6 on C7. Vertebrae: Diffusely abnormal bone marrow signal throughout the cervical spine, included upper thoracic spine, and skull base consistent with known widespread osseous metastatic disease. Thin curvilinear region of low signal through the base of the dens suggestive of a nondisplaced fracture with possible extension more laterally in the C2 vertebral body. Extra osseous tumor  extension about the C2 anterior and posterior elements. Cord: Normal signal. Posterior Fossa, vertebral arteries, paraspinal tissues: Preserved vertebral artery flow voids. Small prevertebral effusion. Previously described clival metastasis with overlying dural thickening extending into the upper cervical spine. No masslike cervical epidural tumor. Disc levels: Cervical disc degeneration greatest at C5-6 where a broad-based posterior disc osteophyte complex and infolding of the ligamentum flavum result in mild-to-moderate spinal stenosis and mild-to-moderate right and severe left neural foraminal stenosis. Disc bulging and mild spurring at C6-7 resulting in moderate right and mild left neural foraminal stenosis without significant spinal stenosis. IMPRESSION: 1. Widespread osseous metastatic disease. 2. Suspected nondisplaced pathologic fracture involving the C2 vertebral body and base of dens. 3. 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 No masslike cervical dural thickening/epidural tumor. 4. Cervical disc degeneration,  worst at C5-6 where there is mild-to-moderate spinal stenosis and severe left neural foraminal stenosis. These results will be called to the ordering clinician or representative by the Radiologist Assistant, and communication documented in the PACS or [employer]. Electronically Signed   By: Logan Bores M.D.   On: 12/02/2019 17:01   MR Thoracic Spine W Wo Contrast  Result Date: 11/18/2019 CLINICAL DATA:  Breast cancer EXAM: MRI THORACIC WITHOUT AND WITH CONTRAST TECHNIQUE: Multiplanar and multiecho pulse sequences of the thoracic spine were obtained without and with intravenous contrast. CONTRAST:  70mL GADAVIST GADOBUTROL 1 MMOL/ML IV SOLN COMPARISON:  10/24/2019 lumbar spine MRI FINDINGS: Cervical Localizer sequence is motion degraded. Therefore counting may not be accurate. Alignment: Trace retrolisthesis at  T10-T11. Vertebrae: Heterogeneous STIR hyperintensity and enhancement throughout the thoracic spine. Mild compression deformity of T12 is unchanged since prior lumbar MRI. There is additional endplate irregularity and mild loss of height at T9 and T10. There is no significant epidural extension. Cord:  No abnormal signal.  No abnormal intrathecal enhancement. Paraspinal and other soft tissues: Extra-spinal metastatic disease is partially imaged and present on prior cross-sectional imaging. Disc levels: Mild multilevel degenerative disc disease and facet arthropathy. There is no high-grade degenerative stenosis. IMPRESSION: Diffuse osseous metastatic disease. No significant epidural extension. No definite acute compression deformity. Electronically Signed   By: Macy Mis M.D.   On: 11/18/2019 14:55   DG Chest Port 1 View  Result Date: 11/29/2019 CLINICAL DATA:  Possible sepsis, metastatic breast cancer EXAM: PORTABLE CHEST 1 VIEW COMPARISON:  05/07/2019 FINDINGS: Increased interstitial prominence with patchy density. No pleural effusion or pneumothorax. Cardiomediastinal contours are within normal limits with normal heart size. IMPRESSION: Interstitial prominence and patchy density, at least a component of which likely reflects metastatic disease seen on recent chest CT. There may be superimposed edema or atypical infection. Electronically Signed   By: Macy Mis M.D.   On: 11/29/2019 14:45   ECHOCARDIOGRAM COMPLETE  Result Date: 12/02/2019    ECHOCARDIOGRAM REPORT   Patient Name:   Krista Doyle Date of Exam: 12/02/2019 Medical Rec #:  [id number]        Height:       63.0 in Accession #:    [id number]       Weight:       114.2 lb Date of Birth:  10-Jan-1961       BSA:          1.524 m Patient Age:    18 years         BP:           131/68 mmHg Patient Gender: F                HR:           99 bpm. Exam Location:  [facility] Procedure: 2D Echo, Cardiac Doppler and Color Doppler Indications:     R94.31 Abnormal EKG; R00.0 Tachycardia  History:        Patient has no prior history of Echocardiogram examinations.                 Abnormal ECG. Breast cancer. Chemotherapy.  Sonographer:    Roseanna Rainbow RDCS (AE) Referring Phys: [phone] [facility]  Sonographer Comments: Technically difficult study due to poor echo windows. Rib artifact. Attempted to turn. Images off-axis. IMPRESSIONS  1. Left ventricular ejection fraction, by estimation, is 60 to 65%. The left ventricle has normal function. The left ventricle has no regional  wall motion abnormalities. Left ventricular diastolic parameters are consistent with Grade I diastolic dysfunction (impaired relaxation).  2. Right ventricular systolic function is normal. The right ventricular size is normal.  3. The mitral valve is normal in structure. No evidence of mitral valve regurgitation. No evidence of mitral stenosis.  4. The aortic valve is tricuspid. Aortic valve regurgitation is not visualized. No aortic stenosis is present.  5. The inferior vena cava is normal in size with greater than 50% respiratory variability, suggesting right atrial pressure of 3 mmHg. FINDINGS  Left Ventricle: Left ventricular ejection fraction, by estimation, is 60 to 65%. The left ventricle has normal function. The left ventricle has no regional wall motion abnormalities. The left ventricular internal cavity size was normal in size. There is  no left ventricular hypertrophy. Left ventricular diastolic parameters are consistent with Grade I diastolic dysfunction (impaired relaxation). Normal left ventricular filling pressure. Right Ventricle: The right ventricular size is normal. No increase in right ventricular wall thickness. Right ventricular systolic function is normal. Left Atrium: Left atrial size was normal in size. Right Atrium: Right atrial size was normal in size. Pericardium: There is no evidence of pericardial effusion. Mitral Valve: The mitral valve is normal in structure. No  evidence of mitral valve regurgitation. No evidence of mitral valve stenosis. Tricuspid Valve: The tricuspid valve is normal in structure. Tricuspid valve regurgitation is not demonstrated. No evidence of tricuspid stenosis. Aortic Valve: The aortic valve is tricuspid. Aortic valve regurgitation is not visualized. No aortic stenosis is present. Aortic valve mean gradient measures 3.1 mmHg. Aortic valve peak gradient measures 6.4 mmHg. Aortic valve area, by VTI measures 1.83 cm. Pulmonic Valve: The pulmonic valve was not well visualized. Pulmonic valve regurgitation is not visualized. No evidence of pulmonic stenosis. Aorta: The aortic root is normal in size and structure. Pulmonary Artery: Indeterminant PASP, inadequate TR jet. Venous: The inferior vena cava is normal in size with greater than 50% respiratory variability, suggesting right atrial pressure of 3 mmHg. IAS/Shunts: No atrial level shunt detected by color flow Doppler.  LEFT VENTRICLE PLAX 2D LVIDd:         3.36 cm     Diastology LVIDs:         2.16 cm     LV e' medial:   6.96 cm/s LV PW:         1.03 cm     LV E/e' medial: 12.4 LV IVS:        0.94 cm LVOT diam:     1.70 cm LV SV:         38 LV SV Index:   25 LVOT Area:     2.27 cm  LV Volumes (MOD) LV vol d, MOD A2C: 55.0 ml LV vol d, MOD A4C: 32.5 ml LV vol s, MOD A2C: 18.4 ml LV vol s, MOD A4C: 11.3 ml LV SV MOD A2C:     36.6 ml LV SV MOD A4C:     32.5 ml LV SV MOD BP:      32.0 ml RIGHT VENTRICLE RV S prime:     11.30 cm/s TAPSE (M-mode): 1.5 cm LEFT ATRIUM           Index      RIGHT ATRIUM          Index LA diam:      2.20 cm 1.44 cm/m RA Area:     7.17 cm LA Vol (A4C): 14.0 ml 9.19 ml/m RA Volume:   12.20 ml  8.01 ml/m  AORTIC VALVE AV Area (Vmax):    1.81 cm AV Area (Vmean):   1.59 cm AV Area (VTI):     1.83 cm AV Vmax:           126.43 cm/s AV Vmean:          81.029 cm/s AV VTI:            0.208 m AV Peak Grad:      6.4 mmHg AV Mean Grad:      3.1 mmHg LVOT Vmax:         101.00 cm/s LVOT  Vmean:        56.600 cm/s LVOT VTI:          0.168 m LVOT/AV VTI ratio: 0.81  AORTA Ao Root diam: 2.80 cm Ao Asc diam:  3.20 cm MITRAL VALVE MV Area (PHT): 4.21 cm    SHUNTS MV Decel Time: 180 msec    Systemic VTI:  0.17 m MV E velocity: 86.50 cm/s  Systemic Diam: 1.70 cm MV A velocity: 90.80 cm/s MV E/A ratio:  0.95 Carlyle Dolly MD Electronically signed by Carlyle Dolly MD Signature Date/Time: 12/02/2019/3:28:22 PM    Final    [place] CORE BIOPSY (LIVER)  Result Date: 11/19/2019 INDICATION: History of breast carcinoma with new multiple liver lesions, lung nodules and bone lesions consistent with recurrent metastatic disease. Biopsy of a liver lesion has been requested to confirm metastatic disease and perform molecular prognostic studies. EXAM: ULTRASOUND GUIDED CORE BIOPSY OF LIVER MEDICATIONS: None. ANESTHESIA/SEDATION: Fentanyl 100 mcg IV; Versed 2.0 mg IV Moderate Sedation Time:  16 minutes. The patient was continuously monitored during the procedure by the interventional radiology nurse under my direct supervision. PROCEDURE: The procedure, risks, benefits, and alternatives were explained to the patient. Questions regarding the procedure were encouraged and answered. The patient understands and consents to the procedure. A time-out was performed prior to initiating the procedure. The abdominal wall was prepped with chlorhexidine in a sterile fashion, and a sterile drape was applied covering the operative field. A sterile gown and sterile gloves were used for the procedure. Local anesthesia was provided with 1% Lidocaine. Ultrasound was used to localize liver lesions. After choosing a lesion in the right lobe of the liver, a 17 gauge needle was advanced into the liver to the margin of the lesion. Three separate coaxial 18 gauge core biopsy samples were obtained through the lesion. Core biopsy samples were submitted in formalin. Gel-Foam pledgets were advanced through the outer needle as the needle was  retracted and removed. Additional ultrasound was performed. COMPLICATIONS: None immediate. FINDINGS: There are multiple hypoechoic solid round and oval masses scattered throughout the liver parenchyma. The largest localized by ultrasound measures approximately 2 cm in diameter and lies within the anterior aspect of the right lobe near the gallbladder fossa. This lesion was sampled yielding solid tissue. IMPRESSION: Ultrasound-guided core biopsy performed of a 2 cm lesion within the right lobe of the liver. Electronically Signed   By: Aletta Edouard M.D.   On: 11/19/2019 15:42    Orson Eva, DO  [facility]  If 7PM-7AM, please contact night-coverage [email or web address] Password [email or web address] 12/04/2019, 11:53 AM   LOS: 5 days

## 2019-12-04 NOTE — TOC Progression Note (Signed)
Transition of Care [facility]) - Progression Note    Patient Details  Name: KRISI AZUA MRN: [id number] Date of Birth: 17-Apr-1960  Transition of Care [facility] CM/SW Contact  Salome Arnt, [place] Phone Number: 12/04/2019, 9:28 AM  Clinical Narrative:  LCSW discussed bed offer at [facility] with pt and husband. They accept. Husband reports pt has received COVID vaccinations. Debbie at [facility] notified and will start [facility] authorization this morning. TOC will continue to follow.       Expected Discharge Plan: [place] Barriers to Discharge: Continued Medical Work up, Ship broker  Expected Discharge Plan and Services Expected Discharge Plan: [place] In-house Referral: NA Discharge Planning Services: NA Post Acute Care Choice: Durable Medical Equipment Living arrangements for the past 2 months: Single Family Home                                       Social Determinants of Health (SDOH) Interventions    Readmission Risk Interventions Readmission Risk Prevention Plan 12/02/2019  Transportation Screening Complete  HRI or [place] Complete  Social Work Consult for [place] Planning/Counseling Complete  Palliative Care Screening Not Applicable  Medication Review Press photographer) Complete  Some recent data might be hidden

## 2019-12-05 ENCOUNTER — Ambulatory Visit (HOSPITAL_COMMUNITY): Payer: Medicare Other

## 2019-12-05 ENCOUNTER — Ambulatory Visit (HOSPITAL_COMMUNITY): Payer: Medicare Other | Admitting: Hematology

## 2019-12-05 ENCOUNTER — Inpatient Hospital Stay (HOSPITAL_COMMUNITY): Payer: BC Managed Care – PPO

## 2019-12-05 LAB — GLUCOSE, CAPILLARY
Glucose-Capillary: 81 mg/dL (ref 70–99)
Glucose-Capillary: 119 mg/dL — ABNORMAL HIGH (ref 70–99)
Glucose-Capillary: 105 mg/dL — ABNORMAL HIGH (ref 70–99)
Glucose-Capillary: 99 mg/dL (ref 70–99)

## 2019-12-05 LAB — COMPREHENSIVE METABOLIC PANEL
ALT: 47 U/L — ABNORMAL HIGH (ref 0–44)
AST: 117 U/L — ABNORMAL HIGH (ref 15–41)
Albumin: 2.6 g/dL — ABNORMAL LOW (ref 3.5–5.0)
Alkaline Phosphatase: 184 U/L — ABNORMAL HIGH (ref 38–126)
Anion gap: 10 (ref 5–15)
BUN: 15 mg/dL (ref 8–23)
CO2: 19 mmol/L — ABNORMAL LOW (ref 22–32)
Calcium: 8.1 mg/dL — ABNORMAL LOW (ref 8.9–10.3)
Chloride: 107 mmol/L (ref 98–111)
Creatinine, Ser: 0.62 mg/dL (ref 0.44–1.00)
GFR, Estimated: >60 mL/min (ref >60)
Glucose, Bld: 85 mg/dL (ref 70–99)
Potassium: 4 mmol/L (ref 3.5–5.1)
Sodium: 136 mmol/L (ref 135–145)
Total Bilirubin: 0.6 mg/dL (ref 0.3–1.2)
Total Protein: 7.3 g/dL (ref 6.5–8.1)

## 2019-12-05 LAB — CBC
HCT: 28.3 % — ABNORMAL LOW (ref 36.0–46.0)
Hemoglobin: 8.5 g/dL — ABNORMAL LOW (ref 12.0–15.0)
MCH: 25.3 pg — ABNORMAL LOW (ref 26.0–34.0)
MCHC: 30 g/dL (ref 30.0–36.0)
MCV: 84.2 fL (ref 80.0–100.0)
Platelets: 296 10*3/uL (ref 150–400)
RBC: 3.36 MIL/uL — ABNORMAL LOW (ref 3.87–5.11)
RDW: 19.9 % — ABNORMAL HIGH (ref 11.5–15.5)
WBC: 8.8 10*3/uL (ref 4.0–10.5)
nRBC: 0.7 % — ABNORMAL HIGH (ref 0.0–0.2)

## 2019-12-05 LAB — RESPIRATORY PANEL BY RT PCR (FLU A&B, COVID)
Influenza A by PCR: NEGATIVE
Influenza B by PCR: NEGATIVE
SARS Coronavirus 2 by RT PCR: NEGATIVE

## 2019-12-05 LAB — MAGNESIUM: Magnesium: 1.8 mg/dL (ref 1.7–2.4)

## 2019-12-05 MED ORDER — LANTUS SOLOSTAR 100 UNIT/ML ~~LOC~~ SOPN: 10.0000 [IU] | PEN_INJECTOR | Freq: Every day | SUBCUTANEOUS | 11 refills | Status: DC

## 2019-12-05 MED ORDER — PREGABALIN 100 MG PO CAPS: 100.0000 mg | ORAL_CAPSULE | Freq: Two times a day (BID) | ORAL | 0 refills | Status: DC

## 2019-12-05 MED ORDER — FENTANYL 100 MCG/HR TD PT72: 1.0000 | MEDICATED_PATCH | TRANSDERMAL | 0 refills | Status: DC

## 2019-12-05 MED ORDER — POLYETHYLENE GLYCOL 3350 17 G PO PACK: 17.0000 g | PACK | Freq: Every day | ORAL | 0 refills | Status: AC

## 2019-12-05 MED ORDER — METOPROLOL TARTRATE 50 MG PO TABS
50.0000 mg | ORAL_TABLET | Freq: Two times a day (BID) | ORAL | 0 refills | Status: DC
Start: 2019-12-05 — End: 2019-12-06

## 2019-12-05 MED ORDER — FENTANYL 100 MCG/HR TD PT72
1.0000 | MEDICATED_PATCH | TRANSDERMAL | Status: DC
  Administered 2019-12-05: 1 via TRANSDERMAL
  Filled 2019-12-05: qty 1

## 2019-12-05 MED ORDER — AMITRIPTYLINE HCL 10 MG PO TABS
20.0000 mg | ORAL_TABLET | Freq: Every day | ORAL | Status: DC
Start: 2019-12-05 — End: 2019-12-12

## 2019-12-05 MED ORDER — HYDROMORPHONE HCL 4 MG PO TABS: 4.0000 mg | ORAL_TABLET | Freq: Four times a day (QID) | ORAL | 0 refills | Status: DC

## 2019-12-05 MED ORDER — METOPROLOL TARTRATE 50 MG PO TABS
50.0000 mg | ORAL_TABLET | Freq: Two times a day (BID) | ORAL | Status: DC
  Administered 2019-12-05 – 2019-12-06 (×2): 50 mg via ORAL
  Filled 2019-12-05 (×2): qty 1

## 2019-12-05 MED ORDER — HYDROMORPHONE HCL 2 MG PO TABS
4.0000 mg | ORAL_TABLET | Freq: Four times a day (QID) | ORAL | Status: DC
  Administered 2019-12-05 – 2019-12-06 (×4): 4 mg via ORAL
  Filled 2019-12-05 (×5): qty 2

## 2019-12-05 NOTE — Discharge Summary (Signed)
Physician Discharge Summary  Krista Doyle DXI:[id number] DOB: Dec 13, 1960 DOA: 11/29/2019  PCP: [name], MD  Admit date: 11/29/2019 Discharge date: 12/05/2019  Admitted From: Home Disposition:  SNF  Recommendations for Outpatient Follow-up:  1. Follow up with PCP in 1-2 weeks 2. Please obtain BMP/CBC in one week 3. Follow up with med/onc, Dr. Delton Coombes,  12/11/19 at 10AM     Discharge Condition: Stable CODE STATUS: FULL Diet recommendation: Regular   Brief/Interim Summary: [name] a59 y.o.female,with past medical history of hypertension, hyperlipidemia, chemotherapy-induced neuropathy, diabetes mellitus, history of breast cancer visually diagnosed in 2008, status post radiation therapy and chemotherapy, with recent diagnosis of metastatic disease to liver and osseous metastasis, patient was sent from oncology office for hypercalcemia, patient was recently diagnosed with lumbar/thoracic spine osseous metastasis, liver metastasis as well, patient work-up was significant for hypercalcemia at oncology office yesterday, where she received IV fluids and IV Zometa, today as well she received IV fluids and calcitonin in oncology office and sent to ED for further management, husband reports patient has more altered and confused over last 24 hours, as well has been weaker than her baseline, she has been reporting headache for 3 weeks, and has poor oral appetite, and oral intake, she has chronic nausea, but no vomiting. - in EDher calcium came>15,elevated at 1.3, baseline 0.8, potassium low at 3.3, MRI brain significant for 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   Discharge Diagnoses:  Hypercalcemia of malignancy -Patient admitted with severe hypercalcemia greater than 15 in the setting of metastatic breast cancer -She received Zometa prior to admission -She was also  started on calcitonin -She was aggressively hydrated with IV fluids -Overall serum calcium has normalized -PTH was in normal range, PTH related peptide in process -continued IVF for now -corrected Ca--9.22 on day of d/c  Metastatic breast cancer -Evidence of metastases to liver, bone, lung -MRI brain showed evidence of skull metastases. Discussed with oncology and felt to be unlikely related to leptomeningeal spread or intracranial spread. No midline shift or mass-effect on imaging -Plans are to follow-up with Dr. Delton Coombes for continued treatments -Palliative care consulted to help address goals of care/pain management--continue full scope of care -Currently, patient and family are processing her diagnosis and she was recently diagnosed with metastatic cancer within the past few weeks  Uncontrolled pain -increase fentanyl TD to 177mcg -change dilaudid to q 6 hours scheduled  Acute metabolic encephalopathy -Related to hypercalcemia -Mental status improving and appears to be back to baseline  Hypokalemia -Replaced -check mag--1.8  Acute kidney injury -Creatinine of 0.8 at baseline -Peak creatinine up to 1.6 -This has since trended down with IV fluids  Insulin-dependent diabetes.  -On Lantus and sliding scale insulin -decreased lantus to 10 units at hs -Continue to follow blood sugars -Blood sugars stable -11/29/19 A1C--6.7  Chronic pain syndrome secondary to underlying metastatic breast cancer  -Reminded her that Dilaudid was ordered every 4 hours as needed -Continue with Lyrica and Robaxin -Palliative care following and assisting with pain management -increase fentanyl TD to 124mcg -change dilaudid to q 6 hours  Opioid-induced constipation -Continue with Movantik  Sinus tachycardia -Heart rate persistently in the 110s to 120s -D-dimer is elevated -CT angiogram of the chest performed was negative for pulmonary embolus -It did demonstrate widespread  metastatic disease -Echocardiogram shows normal ejection fraction -Suspect that her tachycardia is related to pain and volume depletion -Continue pain management -continue metoprolol--increase to 50 mg  bid  Cervical spine fracture of C2 -Pathologic fracture in the setting of metastatic disease -Reviewed images with neurosurgery Dr. Zada Finders who did not recommend any surgical management at this time. -Recommendations were to place the patient in a cervical collar with plans for outpatient neurosurgical follow-up -Activity as tolerated -Seen by physical therapy with recommendations for skilled nursing facility placement    Discharge Instructions   Allergies as of 12/05/2019      Reactions   Other    Cat/dog dander   Pollen Extract       Medication List    STOP taking these medications   calcium carbonate 1250 (500 Ca) MG tablet Commonly known as: OS-CAL - dosed in mg of elemental calcium   ergocalciferol 1.25 MG (50000 UT) capsule Commonly known as: VITAMIN D2   fentaNYL 25 MCG/HR Commonly known as: DURAGESIC Replaced by: fentaNYL 100 MCG/HR   losartan 100 MG tablet Commonly known as: COZAAR   oxyCODONE-acetaminophen 10-325 MG tablet Commonly known as: Percocet   senna 8.6 MG Tabs tablet Commonly known as: SENOKOT     TAKE these medications   albuterol 108 (90 Base) MCG/ACT inhaler Commonly known as: VENTOLIN HFA Inhale 1 puff into the lungs every 6 (six) hours as needed for wheezing or shortness of breath.   amitriptyline 10 MG tablet Commonly known as: ELAVIL Take 2 tablets (20 mg total) by mouth at bedtime. What changed:   how much to take  how to take this  when to take this  additional instructions   atorvastatin 20 MG tablet Commonly known as: LIPITOR Take 20 mg by mouth daily.   BD Pen Needle Nano 2nd Gen 32G X 4 MM Misc Generic drug: Insulin Pen Needle SMARTSIG:Injection Daily   budesonide-formoterol 160-4.5 MCG/ACT inhaler Commonly  known as: SYMBICORT Inhale 2 puffs into the lungs 2 (two) times daily as needed (shortness of breath).   fentaNYL 100 MCG/HR Commonly known as: [place] 1 patch onto the skin every 3 (three) days. Replaces: fentaNYL 25 MCG/HR   HYDROmorphone 4 MG tablet Commonly known as: DILAUDID Take 1 tablet (4 mg total) by mouth every 6 (six) hours. What changed:   medication strength  how much to take  when to take this  reasons to take this   Lactulose 20 GM/30ML Soln Please take 30 ml every 3 hours until you produce a bowel movement. Then take 30 ml at bedtime daily for constipation.   Lantus SoloStar 100 UNIT/ML Solostar Pen Generic drug: insulin glargine Inject 10 Units into the skin at bedtime. What changed: how much to take   letrozole 2.5 MG tablet Commonly known as: FEMARA Take 1 tablet (2.5 mg total) by mouth daily.   lidocaine-prilocaine cream Commonly known as: EMLA Apply a small amount to port a cath site and cover with plastic wrap 1 hour prior to chemotherapy appointments   loratadine 10 MG tablet Commonly known as: CLARITIN Take 10 mg by mouth daily as needed for allergies.   meclizine 25 MG tablet Commonly known as: ANTIVERT Take 25 mg by mouth 2 (two) times daily as needed for dizziness.   megestrol 20 MG tablet Commonly known as: MEGACE Take 20 mg by mouth 2 (two) times daily.   methocarbamol 500 MG tablet Commonly known as: ROBAXIN TAKE 1 TABLET BY MOUTH EVERY 6 HOURS AS NEEDED FOR MUSCLE SPASMS. What changed:   how much to take  how to take this  when to take this  reasons to take this  additional instructions   metoprolol tartrate 50 MG tablet Commonly known as: LOPRESSOR Take 1 tablet (50 mg total) by mouth 2 (two) times daily.   naloxegol oxalate 25 MG Tabs tablet Commonly known as: Movantik Take 1 tablet (25 mg total) by mouth daily.   ondansetron 4 MG tablet Commonly known as: ZOFRAN Take 4 mg by mouth 2 (two) times daily.    OneTouch Delica Plus [id number] Misc USE TO TEST BLOOD SUGAR 2 TIMES A DAY   OneTouch Verio Reflect w/Device Kit USE TO TEST BLOOD SUGAR 2 TIMES A DAY   OneTouch Verio test strip Generic drug: glucose blood 1 each by Other route 2 (two) times daily.   PERTUZUMAB IV Inject into the vein every 21 ( twenty-one) days.   polyethylene glycol 17 g packet Commonly known as: MIRALAX / GLYCOLAX Take 17 g by mouth daily.   pregabalin 100 MG capsule Commonly known as: LYRICA Take 1 capsule (100 mg total) by mouth 2 (two) times daily.   PreserVision AREDS 2 Caps Take 1 capsule by mouth daily.   prochlorperazine 10 MG tablet Commonly known as: COMPAZINE Take 1 tablet (10 mg total) by mouth every 6 (six) hours as needed (Nausea or vomiting).   TAXOTERE IV Inject into the vein every 21 ( twenty-one) days.   TRASTUZUMAB IV Inject into the vein every 21 ( twenty-one) days.       Contact information for follow-up providers    Derek Jack, MD On 12/11/2019.   Specialty: Hematology Why: Come for labs (10:00 am) at the main entrance. Appointment with Dr. Delton Coombes at 11:00 am. Contact information: [place] [id number] [phone]            Contact information for after-discharge care    [facility] .   Service: Skilled Nursing Contact information: [place] [phone]                 Allergies  Allergen Reactions  . Other     Cat/dog dander  . Pollen Extract     Consultations:  Palliative medicine   Procedures/Studies: CT Head Wo Contrast  Result Date: 11/29/2019 CLINICAL DATA:  Delirium EXAM: CT HEAD WITHOUT CONTRAST TECHNIQUE: Contiguous axial images were obtained from the base of the skull through the vertex without intravenous contrast. COMPARISON:  None. FINDINGS: Brain: No acute infarct or intracranial hemorrhage. No mass lesion. No  midline shift, ventriculomegaly or extra-axial fluid collection. Vascular: No hyperdense vessel or unexpected calcification. Carotid siphon atherosclerotic calcifications. Skull: Mottled, heterogenous appearance of the bifrontal, right parietal calvarium and clivus. There is a 1.6 cm right calvarial soft tissue focus with dehiscence of the bone (2:18). Sinuses/Orbits: Normal orbits. Clear paranasal sinuses. No mastoid effusion. Other: Asymmetric prominence of the right temporalis muscle (2:12). IMPRESSION: No acute infarct or intracranial hemorrhage. Right frontal calvarial soft tissue, asymmetric prominence of the right temporalis muscle and calvarial/clivus heterogeneity is concerning for metastases. MRI head with and without contrast is recommended for further evaluation. Electronically Signed   By: Primitivo Gauze M.D.   On: 11/29/2019 16:37   CT SOFT TISSUE NECK W CONTRAST  Result Date: 12/01/2019 CLINICAL DATA:  Epiglottitis or tonsillitis suspected.  Neck pain. EXAM: CT NECK WITH CONTRAST TECHNIQUE: Multidetector CT imaging of the neck was performed using the standard protocol following the bolus administration of intravenous contrast. CONTRAST:  64mL OMNIPAQUE IOHEXOL 350 MG/ML SOLN COMPARISON:  11/29/2019 CT and  MRI head.  Concurrent CTA chest. FINDINGS: Pharynx and larynx: Clear nasopharynx. Normal appearance of the epiglottis. Partial effacement of the left vallecula and piriform sinus, likely secondary to neck positioning. The vallecula and piriform sinuses are otherwise unremarkable. No vocal cord paralysis. Small retropharyngeal effusion spanning the C2-C5 levels. Salivary glands: No inflammation, mass, or stone. Thyroid: Normal. Lymph nodes: Diffusely prominent subcentimeter cervical nodes. Vascular: Normal intravascular enhancement. Limited intracranial: Please see recent CT and MRI head. Visualized orbits: Normal orbits. Mastoids and visualized paranasal sinuses: Pneumatized. Skeleton:  Diffuse heterogeneity of the bone marrow predominantly involving the cervical spine at the C1-2 and C6-T1 levels. There is multilevel involvement of the posterior elements. Vertebral body heights are preserved. Perivertebral inflammatory changes most prominent at the C2 level. Please see recent CT and MRI head for better evaluation of dominant clival and multifocal calvarial osseous metastases. Redemonstration of anterior left TMJ metastasis. Upper chest: Partially imaged pulmonary metastases. Please see concurrent CTA chest for additional findings below the thoracic inlet. Other: None. IMPRESSION: No evidence of epiglottitis or tonsillitis. Diffuse osseous metastases with prominent involvement of the cervical spine. Vertebral body heights are preserved however cannot exclude pending pathologic fracture. Perivertebral inflammatory changes most prominent at the atlantoaxial junction with small retropharyngeal effusion. Cannot exclude metastatic soft tissue involvement. MRI cervical spine is recommended for better evaluation. Partially imaged pulmonary metastases. Please see concurrent CTA chest for additional findings below the thoracic inlet. These results will be called to the ordering clinician or representative by the Radiologist Assistant, and communication documented in the PACS or [employer]. Electronically Signed   By: Primitivo Gauze M.D.   On: 12/01/2019 19:30   CT Chest W Contrast  Result Date: 11/13/2019 CLINICAL DATA:  Breast cancer, recent weight loss and decreased appetite. Liver lesions on recent CT abdomen pelvis. EXAM: CT CHEST WITH CONTRAST TECHNIQUE: Multidetector CT imaging of the chest was performed during intravenous contrast administration. CONTRAST:  66mL OMNIPAQUE IOHEXOL 300 MG/ML  SOLN COMPARISON:  CT abdomen pelvis 11/02/2019, MR lumbar spine 10/24/2019. FINDINGS: Cardiovascular: Extensive mural thickening and severe luminal narrowing involving the left subclavian artery.  Heart size normal. No pericardial effusion. Mediastinum/Nodes: Mildly hypodense left thyroid nodule measures 1.3 cm. No follow-up recommended (ref: J Am Coll Radiol. 2015 Feb;12(2): 143-50).No pathologically enlarged mediastinal lymph nodes. Left hilar lymph node measures 1.4 x 1.6 cm. Right axillary lymph nodes measure up to 8 mm. Surgical clips in the left axilla. Soft tissue mass in the epicardial fat along the cardiac apex measures 2.0 x 2.7 cm (2/102). Esophagus is grossly unremarkable. Lungs/Pleura: Diffuse perilymphatic nodularity bilaterally. Extensive pleural/subpleural nodularity in the left hemithorax. Index subpleural nodule in the anterior left upper lobe measures 1.1 x 1.9 cm (4/50). No pleural fluid. Airway is unremarkable. Upper Abdomen: Multiple heterogeneous thick-walled lesions throughout the liver, seen in their entirety on 11/02/2019. Index lesion in the dome of the liver measures 1.9 x 2.1 cm (2/107). Visualized portion of the gallbladder is unremarkable. Slight nodular thickening of both adrenal glands. Subcentimeter low-attenuation lesion in the left kidney is likely a cyst. Visualized portions of the kidneys, spleen, pancreas, stomach and bowel are grossly unremarkable. Periportal lymph nodes measure up to 11 mm. Gastrohepatic ligament lymph nodes, 5 mm. Musculoskeletal: Mottled sclerotic metastases are seen throughout the visualized osseous structures. Healing or healed pathologic right rib fractures. Pathologic superior endplate compression fracture involving T12. Nodular soft tissue in the lateral left breast measures 1.1 x 1.7 cm (2/60). IMPRESSION: 1. Widespread pleuroparenchymal, left hilar, hepatic and  osseous metastatic disease. Lateral left breast mass may be postoperative in etiology. Disease recurrence cannot be excluded. 2. Extensive mural thickening and severe luminal narrowing involving the left subclavian artery. Electronically Signed   By: Lorin Picket M.D.   On:  11/13/2019 09:54   CT ANGIO CHEST PE W OR WO CONTRAST  Result Date: 12/01/2019 CLINICAL DATA:  PE suspected, low/intermediate prob, positive D-dimer History of metastatic breast cancer. EXAM: CT ANGIOGRAPHY CHEST WITH CONTRAST TECHNIQUE: Multidetector CT imaging of the chest was performed using the standard protocol during bolus administration of intravenous contrast. Multiplanar CT image reconstructions and MIPs were obtained to evaluate the vascular anatomy. CONTRAST:  56mL OMNIPAQUE IOHEXOL 350 MG/ML SOLN COMPARISON:  Chest CT 11/12/2019, thoracic spine MRI 11/18/2019 FINDINGS: Cardiovascular: There are no filling defects within the pulmonary arteries to suggest pulmonary embolus. Left hilar lymph node is enlarging in causing increasing mass effect on lingular pulmonary artery but no evidence of invasion. Common origin of brachiocephalic and left common carotid artery, variant arch anatomy. Extensive mural thickening with severe luminal narrowing involving the proximal left subclavian artery is unchanged from recent exam. There is no aortic dissection or acute aortic findings. The heart is normal in size. No pericardial effusion. Mediastinum/Nodes: Left hilar lymph node has slightly increased in size currently measuring 18 x 15 mm, previously 16 x 14 mm. Soft tissue mass in the epicardial fat along the cardiac apex measures 3.3 x 2.1 cm (series 5, image 68), previously 2.7 x 2.0 cm. No esophageal wall thickening. Previous left thyroid nodule is not well-defined on the current exam due to streak artifact in the right subclavian vein from dense IV contrast. Surgical clips in the left axilla. Previous small right axillary nodes are partially obscured by dense IV contrast. Lungs/Pleura: Extensive bilateral pulmonary nodules are again seen, both random and pleural/subpleural. Majority of these are stable from recent prior, however largest nodule currently measures 2.2 x 1.2 cm, series 7, image 51, previously 1.9  x 1.1 cm. A small right pleural effusion is new, with adjacent compressive atelectasis. No septal thickening or findings of pulmonary edema. Upper Abdomen: Known liver lesions on prior exam are less well-defined given phase of IV contrast. Musculoskeletal: Diffuse osseous metastatic disease throughout the thoracic osseous structures lateral left breast soft tissue nodularity measures 15 x 12 mm, series 5, image 49. With lytic and blastic osseous lesions. Callus formation about right anterior ribs fractures, also seen on previous. Review of the MIP images confirms the above findings. IMPRESSION: 1. No pulmonary embolus. 2. New small right pleural effusion with adjacent compressive atelectasis. 3. Extensive bilateral pulmonary nodules consistent with metastatic disease. Majority of these are stable from recent prior, however largest pulmonary nodule has increased in size from prior. 4. Left hilar lymph node has slightly increased in size from recent prior. Soft tissue mass in the epicardial fat along the left cardiac apex has increased in size. 5. Diffuse osseous metastatic disease. 6. Left breast soft tissue nodularity laterally again seen. 7. Known liver lesions on prior exam are less well-defined on the current exam given phase of IV contrast. 8. Extensive mural thickening with severe luminal narrowing involving the proximal left subclavian artery is unchanged from recent prior. Electronically Signed   By: Keith Rake M.D.   On: 12/01/2019 19:20   MR Brain W and Wo Contrast  Result Date: 11/29/2019 CLINICAL DATA:  Dizziness, nonspecific.  Brain mass or lesion. EXAM: MRI HEAD WITHOUT AND WITH CONTRAST TECHNIQUE: Multiplanar, multiecho pulse sequences of  the brain and surrounding structures were obtained without and with intravenous contrast. CONTRAST:  69mL GADAVIST GADOBUTROL 1 MMOL/ML IV SOLN COMPARISON:  11/29/2019 head CT. FINDINGS: Brain: No acute infarct. No acute intracranial hemorrhage. No midline  shift, ventriculomegaly or extra-axial fluid collection. Diffuse dural thickening overlying the right cerebral convexity. Focal dural thickening overlying the left parietal lobe (18:117). 1.5 x 0.5 cm right dural-based lesion overlying the temporal convexity (18:67). There is also dural thickening along the clivus. Vascular: Preserved major intracranial flow voids. Skull and upper cervical spine: Multifocal T2 hyperintense enhancing calvarial lesions reflect osseous metastases. Enhancing 1.8 cm right frontal calvarial soft tissue (15:35) with dehiscence of the overlying cortex. 2.6 x 1.8 cm clival metastasis (15:10). Smaller enhancing osseous lesions are also seen involving the bilateral sphenoid wings and occipital condyles. Sinuses/Orbits: Normal orbits clear paranasal sinuses. Trace right mastoid effusion. Other: 1.4 x 0.8 cm enhancing soft tissue involving the left TMJ (18:40). Enhancing soft tissue infiltration involving the right temporalis muscle which is asymmetrically thickened (18:94). IMPRESSION: Dural-based metastatic disease demonstrating right predominance. Focal plaque-like dural lesions measuring up to 1.5 x 0.5 cm overlie the right temporal and left parietal convexities. Multifocal osseous metastases with dominant lesions involving the clivus, right frontal calvarium and left TMJ. Metastatic involvement of the right temporalis muscle. Electronically Signed   By: Primitivo Gauze M.D.   On: 11/29/2019 19:25   MR CERVICAL SPINE W WO CONTRAST  Result Date: 12/02/2019 CLINICAL DATA:  Metastatic breast cancer with possible pathologic fracture in the cervical spine. EXAM: MRI CERVICAL SPINE WITHOUT AND WITH CONTRAST TECHNIQUE: Multiplanar and multiecho pulse sequences of the cervical spine, to include the craniocervical junction and cervicothoracic junction, were obtained without and with intravenous contrast. CONTRAST:  83mL GADAVIST GADOBUTROL 1 MMOL/ML IV SOLN COMPARISON:  Neck CT 12/01/2019.  Brain MRI 11/29/2019. Thoracic spine MRI 11/18/2019. FINDINGS: Alignment: Minimal retrolisthesis of C5 on C6 and C6 on C7. Vertebrae: Diffusely abnormal bone marrow signal throughout the cervical spine, included upper thoracic spine, and skull base consistent with known widespread osseous metastatic disease. Thin curvilinear region of low signal through the base of the dens suggestive of a nondisplaced fracture with possible extension more laterally in the C2 vertebral body. Extra osseous tumor extension about the C2 anterior and posterior elements. Cord: Normal signal. Posterior Fossa, vertebral arteries, paraspinal tissues: Preserved vertebral artery flow voids. Small prevertebral effusion. Previously described clival metastasis with overlying dural thickening extending into the upper cervical spine. No masslike cervical epidural tumor. Disc levels: Cervical disc degeneration greatest at C5-6 where a broad-based posterior disc osteophyte complex and infolding of the ligamentum flavum result in mild-to-moderate spinal stenosis and mild-to-moderate right and severe left neural foraminal stenosis. Disc bulging and mild spurring at C6-7 resulting in moderate right and mild left neural foraminal stenosis without significant spinal stenosis. IMPRESSION: 1. Widespread osseous metastatic disease. 2. Suspected nondisplaced pathologic fracture involving the C2 vertebral body and base of dens. 3. 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 No masslike cervical dural thickening/epidural tumor. 4. Cervical disc degeneration, worst at C5-6 where there is mild-to-moderate spinal stenosis and severe left neural foraminal stenosis. These results will be called to the ordering clinician or representative by the Radiologist Assistant, and communication documented in the PACS or [employer]. Electronically Signed   By: Logan Bores M.D.   On: 12/02/2019  17:01   MR Thoracic Spine W Wo Contrast  Result Date: 11/18/2019 CLINICAL DATA:  Breast  cancer EXAM: MRI THORACIC WITHOUT AND WITH CONTRAST TECHNIQUE: Multiplanar and multiecho pulse sequences of the thoracic spine were obtained without and with intravenous contrast. CONTRAST:  59mL GADAVIST GADOBUTROL 1 MMOL/ML IV SOLN COMPARISON:  10/24/2019 lumbar spine MRI FINDINGS: Cervical Localizer sequence is motion degraded. Therefore counting may not be accurate. Alignment: Trace retrolisthesis at T10-T11. Vertebrae: Heterogeneous STIR hyperintensity and enhancement throughout the thoracic spine. Mild compression deformity of T12 is unchanged since prior lumbar MRI. There is additional endplate irregularity and mild loss of height at T9 and T10. There is no significant epidural extension. Cord:  No abnormal signal.  No abnormal intrathecal enhancement. Paraspinal and other soft tissues: Extra-spinal metastatic disease is partially imaged and present on prior cross-sectional imaging. Disc levels: Mild multilevel degenerative disc disease and facet arthropathy. There is no high-grade degenerative stenosis. IMPRESSION: Diffuse osseous metastatic disease. No significant epidural extension. No definite acute compression deformity. Electronically Signed   By: Macy Mis M.D.   On: 11/18/2019 14:55   DG Chest Port 1 View  Result Date: 11/29/2019 CLINICAL DATA:  Possible sepsis, metastatic breast cancer EXAM: PORTABLE CHEST 1 VIEW COMPARISON:  05/07/2019 FINDINGS: Increased interstitial prominence with patchy density. No pleural effusion or pneumothorax. Cardiomediastinal contours are within normal limits with normal heart size. IMPRESSION: Interstitial prominence and patchy density, at least a component of which likely reflects metastatic disease seen on recent chest CT. There may be superimposed edema or atypical infection. Electronically Signed   By: Macy Mis M.D.   On: 11/29/2019 14:45   ECHOCARDIOGRAM  COMPLETE  Result Date: 12/02/2019    ECHOCARDIOGRAM REPORT   Patient Name:   Krista Doyle Date of Exam: 12/02/2019 Medical Rec #:  [id number]        Height:       63.0 in Accession #:    [id number]       Weight:       114.2 lb Date of Birth:  08-30-1960       BSA:          1.524 m Patient Age:    63 years         BP:           131/68 mmHg Patient Gender: F                HR:           99 bpm. Exam Location:  [facility] Procedure: 2D Echo, Cardiac Doppler and Color Doppler Indications:    R94.31 Abnormal EKG; R00.0 Tachycardia  History:        Patient has no prior history of Echocardiogram examinations.                 Abnormal ECG. Breast cancer. Chemotherapy.  Sonographer:    Roseanna Rainbow RDCS (AE) Referring Phys: [id number] [facility]  Sonographer Comments: Technically difficult study due to poor echo windows. Rib artifact. Attempted to turn. Images off-axis. IMPRESSIONS  1. Left ventricular ejection fraction, by estimation, is 60 to 65%. The left ventricle has normal function. The left ventricle has no regional wall motion abnormalities. Left ventricular diastolic parameters are consistent with Grade I diastolic dysfunction (impaired relaxation).  2. Right ventricular systolic function is normal. The right ventricular size is normal.  3. The mitral valve is normal in structure. No evidence of mitral valve regurgitation. No evidence of mitral stenosis.  4. The aortic valve is tricuspid. Aortic valve regurgitation is not visualized. No aortic stenosis is present.  5.  The inferior vena cava is normal in size with greater than 50% respiratory variability, suggesting right atrial pressure of 3 mmHg. FINDINGS  Left Ventricle: Left ventricular ejection fraction, by estimation, is 60 to 65%. The left ventricle has normal function. The left ventricle has no regional wall motion abnormalities. The left ventricular internal cavity size was normal in size. There is  no left ventricular hypertrophy. Left ventricular diastolic  parameters are consistent with Grade I diastolic dysfunction (impaired relaxation). Normal left ventricular filling pressure. Right Ventricle: The right ventricular size is normal. No increase in right ventricular wall thickness. Right ventricular systolic function is normal. Left Atrium: Left atrial size was normal in size. Right Atrium: Right atrial size was normal in size. Pericardium: There is no evidence of pericardial effusion. Mitral Valve: The mitral valve is normal in structure. No evidence of mitral valve regurgitation. No evidence of mitral valve stenosis. Tricuspid Valve: The tricuspid valve is normal in structure. Tricuspid valve regurgitation is not demonstrated. No evidence of tricuspid stenosis. Aortic Valve: The aortic valve is tricuspid. Aortic valve regurgitation is not visualized. No aortic stenosis is present. Aortic valve mean gradient measures 3.1 mmHg. Aortic valve peak gradient measures 6.4 mmHg. Aortic valve area, by VTI measures 1.83 cm. Pulmonic Valve: The pulmonic valve was not well visualized. Pulmonic valve regurgitation is not visualized. No evidence of pulmonic stenosis. Aorta: The aortic root is normal in size and structure. Pulmonary Artery: Indeterminant PASP, inadequate TR jet. Venous: The inferior vena cava is normal in size with greater than 50% respiratory variability, suggesting right atrial pressure of 3 mmHg. IAS/Shunts: No atrial level shunt detected by color flow Doppler.  LEFT VENTRICLE PLAX 2D LVIDd:         3.36 cm     Diastology LVIDs:         2.16 cm     LV e' medial:   6.96 cm/s LV PW:         1.03 cm     LV E/e' medial: 12.4 LV IVS:        0.94 cm LVOT diam:     1.70 cm LV SV:         38 LV SV Index:   25 LVOT Area:     2.27 cm  LV Volumes (MOD) LV vol d, MOD A2C: 55.0 ml LV vol d, MOD A4C: 32.5 ml LV vol s, MOD A2C: 18.4 ml LV vol s, MOD A4C: 11.3 ml LV SV MOD A2C:     36.6 ml LV SV MOD A4C:     32.5 ml LV SV MOD BP:      32.0 ml RIGHT VENTRICLE RV S prime:      11.30 cm/s TAPSE (M-mode): 1.5 cm LEFT ATRIUM           Index      RIGHT ATRIUM          Index LA diam:      2.20 cm 1.44 cm/m RA Area:     7.17 cm LA Vol (A4C): 14.0 ml 9.19 ml/m RA Volume:   12.20 ml 8.01 ml/m  AORTIC VALVE AV Area (Vmax):    1.81 cm AV Area (Vmean):   1.59 cm AV Area (VTI):     1.83 cm AV Vmax:           126.43 cm/s AV Vmean:          81.029 cm/s AV VTI:  0.208 m AV Peak Grad:      6.4 mmHg AV Mean Grad:      3.1 mmHg LVOT Vmax:         101.00 cm/s LVOT Vmean:        56.600 cm/s LVOT VTI:          0.168 m LVOT/AV VTI ratio: 0.81  AORTA Ao Root diam: 2.80 cm Ao Asc diam:  3.20 cm MITRAL VALVE MV Area (PHT): 4.21 cm    SHUNTS MV Decel Time: 180 msec    Systemic VTI:  0.17 m MV E velocity: 86.50 cm/s  Systemic Diam: 1.70 cm MV A velocity: 90.80 cm/s MV E/A ratio:  0.95 Dina Rich MD Electronically signed by Dina Rich MD Signature Date/Time: 12/02/2019/3:28:22 PM    Final    [place] CORE BIOPSY (LIVER)  Result Date: 11/19/2019 INDICATION: History of breast carcinoma with new multiple liver lesions, lung nodules and bone lesions consistent with recurrent metastatic disease. Biopsy of a liver lesion has been requested to confirm metastatic disease and perform molecular prognostic studies. EXAM: ULTRASOUND GUIDED CORE BIOPSY OF LIVER MEDICATIONS: None. ANESTHESIA/SEDATION: Fentanyl 100 mcg IV; Versed 2.0 mg IV Moderate Sedation Time:  16 minutes. The patient was continuously monitored during the procedure by the interventional radiology nurse under my direct supervision. PROCEDURE: The procedure, risks, benefits, and alternatives were explained to the patient. Questions regarding the procedure were encouraged and answered. The patient understands and consents to the procedure. A time-out was performed prior to initiating the procedure. The abdominal wall was prepped with chlorhexidine in a sterile fashion, and a sterile drape was applied covering the operative field. A  sterile gown and sterile gloves were used for the procedure. Local anesthesia was provided with 1% Lidocaine. Ultrasound was used to localize liver lesions. After choosing a lesion in the right lobe of the liver, a 17 gauge needle was advanced into the liver to the margin of the lesion. Three separate coaxial 18 gauge core biopsy samples were obtained through the lesion. Core biopsy samples were submitted in formalin. Gel-Foam pledgets were advanced through the outer needle as the needle was retracted and removed. Additional ultrasound was performed. COMPLICATIONS: None immediate. FINDINGS: There are multiple hypoechoic solid round and oval masses scattered throughout the liver parenchyma. The largest localized by ultrasound measures approximately 2 cm in diameter and lies within the anterior aspect of the right lobe near the gallbladder fossa. This lesion was sampled yielding solid tissue. IMPRESSION: Ultrasound-guided core biopsy performed of a 2 cm lesion within the right lobe of the liver. Electronically Signed   By: Irish Lack M.D.   On: 11/19/2019 15:42        Discharge Exam: Vitals:   12/05/19 0432 12/05/19 0834  BP: (!) 150/69   Pulse: 96   Resp: 18   Temp: 98.6 F (37 C)   SpO2: 98% 96%   Vitals:   12/04/19 1952 12/04/19 2040 12/05/19 0432 12/05/19 0834  BP:  127/69 (!) 150/69   Pulse:  99 96   Resp:  16 18   Temp:  99.1 F (37.3 C) 98.6 F (37 C)   TempSrc:  Oral Oral   SpO2: 96% 99% 98% 96%  Weight:        General: Pt is alert, awake, not in acute distress Cardiovascular: RRR, S1/S2 +, no rubs, no gallops Respiratory: bibasilar crackles. No wheeze Abdominal: Soft, NT, ND, bowel sounds + Extremities: no edema, no cyanosis   The results of significant  diagnostics from this hospitalization (including imaging, microbiology, ancillary and laboratory) are listed below for reference.    Significant Diagnostic Studies: CT Head Wo Contrast  Result Date: 11/29/2019  CLINICAL DATA:  Delirium EXAM: CT HEAD WITHOUT CONTRAST TECHNIQUE: Contiguous axial images were obtained from the base of the skull through the vertex without intravenous contrast. COMPARISON:  None. FINDINGS: Brain: No acute infarct or intracranial hemorrhage. No mass lesion. No midline shift, ventriculomegaly or extra-axial fluid collection. Vascular: No hyperdense vessel or unexpected calcification. Carotid siphon atherosclerotic calcifications. Skull: Mottled, heterogenous appearance of the bifrontal, right parietal calvarium and clivus. There is a 1.6 cm right calvarial soft tissue focus with dehiscence of the bone (2:18). Sinuses/Orbits: Normal orbits. Clear paranasal sinuses. No mastoid effusion. Other: Asymmetric prominence of the right temporalis muscle (2:12). IMPRESSION: No acute infarct or intracranial hemorrhage. Right frontal calvarial soft tissue, asymmetric prominence of the right temporalis muscle and calvarial/clivus heterogeneity is concerning for metastases. MRI head with and without contrast is recommended for further evaluation. Electronically Signed   By: Primitivo Gauze M.D.   On: 11/29/2019 16:37   CT SOFT TISSUE NECK W CONTRAST  Result Date: 12/01/2019 CLINICAL DATA:  Epiglottitis or tonsillitis suspected.  Neck pain. EXAM: CT NECK WITH CONTRAST TECHNIQUE: Multidetector CT imaging of the neck was performed using the standard protocol following the bolus administration of intravenous contrast. CONTRAST:  39mL OMNIPAQUE IOHEXOL 350 MG/ML SOLN COMPARISON:  11/29/2019 CT and MRI head.  Concurrent CTA chest. FINDINGS: Pharynx and larynx: Clear nasopharynx. Normal appearance of the epiglottis. Partial effacement of the left vallecula and piriform sinus, likely secondary to neck positioning. The vallecula and piriform sinuses are otherwise unremarkable. No vocal cord paralysis. Small retropharyngeal effusion spanning the C2-C5 levels. Salivary glands: No inflammation, mass, or stone.  Thyroid: Normal. Lymph nodes: Diffusely prominent subcentimeter cervical nodes. Vascular: Normal intravascular enhancement. Limited intracranial: Please see recent CT and MRI head. Visualized orbits: Normal orbits. Mastoids and visualized paranasal sinuses: Pneumatized. Skeleton: Diffuse heterogeneity of the bone marrow predominantly involving the cervical spine at the C1-2 and C6-T1 levels. There is multilevel involvement of the posterior elements. Vertebral body heights are preserved. Perivertebral inflammatory changes most prominent at the C2 level. Please see recent CT and MRI head for better evaluation of dominant clival and multifocal calvarial osseous metastases. Redemonstration of anterior left TMJ metastasis. Upper chest: Partially imaged pulmonary metastases. Please see concurrent CTA chest for additional findings below the thoracic inlet. Other: None. IMPRESSION: No evidence of epiglottitis or tonsillitis. Diffuse osseous metastases with prominent involvement of the cervical spine. Vertebral body heights are preserved however cannot exclude pending pathologic fracture. Perivertebral inflammatory changes most prominent at the atlantoaxial junction with small retropharyngeal effusion. Cannot exclude metastatic soft tissue involvement. MRI cervical spine is recommended for better evaluation. Partially imaged pulmonary metastases. Please see concurrent CTA chest for additional findings below the thoracic inlet. These results will be called to the ordering clinician or representative by the Radiologist Assistant, and communication documented in the PACS or [employer]. Electronically Signed   By: Primitivo Gauze M.D.   On: 12/01/2019 19:30   CT Chest W Contrast  Result Date: 11/13/2019 CLINICAL DATA:  Breast cancer, recent weight loss and decreased appetite. Liver lesions on recent CT abdomen pelvis. EXAM: CT CHEST WITH CONTRAST TECHNIQUE: Multidetector CT imaging of the chest was performed  during intravenous contrast administration. CONTRAST:  54mL OMNIPAQUE IOHEXOL 300 MG/ML  SOLN COMPARISON:  CT abdomen pelvis 11/02/2019, MR lumbar spine 10/24/2019. FINDINGS: Cardiovascular: Extensive mural  thickening and severe luminal narrowing involving the left subclavian artery. Heart size normal. No pericardial effusion. Mediastinum/Nodes: Mildly hypodense left thyroid nodule measures 1.3 cm. No follow-up recommended (ref: J Am Coll Radiol. 2015 Feb;12(2): 143-50).No pathologically enlarged mediastinal lymph nodes. Left hilar lymph node measures 1.4 x 1.6 cm. Right axillary lymph nodes measure up to 8 mm. Surgical clips in the left axilla. Soft tissue mass in the epicardial fat along the cardiac apex measures 2.0 x 2.7 cm (2/102). Esophagus is grossly unremarkable. Lungs/Pleura: Diffuse perilymphatic nodularity bilaterally. Extensive pleural/subpleural nodularity in the left hemithorax. Index subpleural nodule in the anterior left upper lobe measures 1.1 x 1.9 cm (4/50). No pleural fluid. Airway is unremarkable. Upper Abdomen: Multiple heterogeneous thick-walled lesions throughout the liver, seen in their entirety on 11/02/2019. Index lesion in the dome of the liver measures 1.9 x 2.1 cm (2/107). Visualized portion of the gallbladder is unremarkable. Slight nodular thickening of both adrenal glands. Subcentimeter low-attenuation lesion in the left kidney is likely a cyst. Visualized portions of the kidneys, spleen, pancreas, stomach and bowel are grossly unremarkable. Periportal lymph nodes measure up to 11 mm. Gastrohepatic ligament lymph nodes, 5 mm. Musculoskeletal: Mottled sclerotic metastases are seen throughout the visualized osseous structures. Healing or healed pathologic right rib fractures. Pathologic superior endplate compression fracture involving T12. Nodular soft tissue in the lateral left breast measures 1.1 x 1.7 cm (2/60). IMPRESSION: 1. Widespread pleuroparenchymal, left hilar, hepatic and  osseous metastatic disease. Lateral left breast mass may be postoperative in etiology. Disease recurrence cannot be excluded. 2. Extensive mural thickening and severe luminal narrowing involving the left subclavian artery. Electronically Signed   By: Lorin Picket M.D.   On: 11/13/2019 09:54   CT ANGIO CHEST PE W OR WO CONTRAST  Result Date: 12/01/2019 CLINICAL DATA:  PE suspected, low/intermediate prob, positive D-dimer History of metastatic breast cancer. EXAM: CT ANGIOGRAPHY CHEST WITH CONTRAST TECHNIQUE: Multidetector CT imaging of the chest was performed using the standard protocol during bolus administration of intravenous contrast. Multiplanar CT image reconstructions and MIPs were obtained to evaluate the vascular anatomy. CONTRAST:  57mL OMNIPAQUE IOHEXOL 350 MG/ML SOLN COMPARISON:  Chest CT 11/12/2019, thoracic spine MRI 11/18/2019 FINDINGS: Cardiovascular: There are no filling defects within the pulmonary arteries to suggest pulmonary embolus. Left hilar lymph node is enlarging in causing increasing mass effect on lingular pulmonary artery but no evidence of invasion. Common origin of brachiocephalic and left common carotid artery, variant arch anatomy. Extensive mural thickening with severe luminal narrowing involving the proximal left subclavian artery is unchanged from recent exam. There is no aortic dissection or acute aortic findings. The heart is normal in size. No pericardial effusion. Mediastinum/Nodes: Left hilar lymph node has slightly increased in size currently measuring 18 x 15 mm, previously 16 x 14 mm. Soft tissue mass in the epicardial fat along the cardiac apex measures 3.3 x 2.1 cm (series 5, image 68), previously 2.7 x 2.0 cm. No esophageal wall thickening. Previous left thyroid nodule is not well-defined on the current exam due to streak artifact in the right subclavian vein from dense IV contrast. Surgical clips in the left axilla. Previous small right axillary nodes are  partially obscured by dense IV contrast. Lungs/Pleura: Extensive bilateral pulmonary nodules are again seen, both random and pleural/subpleural. Majority of these are stable from recent prior, however largest nodule currently measures 2.2 x 1.2 cm, series 7, image 51, previously 1.9 x 1.1 cm. A small right pleural effusion is new, with adjacent compressive atelectasis.  No septal thickening or findings of pulmonary edema. Upper Abdomen: Known liver lesions on prior exam are less well-defined given phase of IV contrast. Musculoskeletal: Diffuse osseous metastatic disease throughout the thoracic osseous structures lateral left breast soft tissue nodularity measures 15 x 12 mm, series 5, image 49. With lytic and blastic osseous lesions. Callus formation about right anterior ribs fractures, also seen on previous. Review of the MIP images confirms the above findings. IMPRESSION: 1. No pulmonary embolus. 2. New small right pleural effusion with adjacent compressive atelectasis. 3. Extensive bilateral pulmonary nodules consistent with metastatic disease. Majority of these are stable from recent prior, however largest pulmonary nodule has increased in size from prior. 4. Left hilar lymph node has slightly increased in size from recent prior. Soft tissue mass in the epicardial fat along the left cardiac apex has increased in size. 5. Diffuse osseous metastatic disease. 6. Left breast soft tissue nodularity laterally again seen. 7. Known liver lesions on prior exam are less well-defined on the current exam given phase of IV contrast. 8. Extensive mural thickening with severe luminal narrowing involving the proximal left subclavian artery is unchanged from recent prior. Electronically Signed   By: Keith Rake M.D.   On: 12/01/2019 19:20   MR Brain W and Wo Contrast  Result Date: 11/29/2019 CLINICAL DATA:  Dizziness, nonspecific.  Brain mass or lesion. EXAM: MRI HEAD WITHOUT AND WITH CONTRAST TECHNIQUE: Multiplanar,  multiecho pulse sequences of the brain and surrounding structures were obtained without and with intravenous contrast. CONTRAST:  74mL GADAVIST GADOBUTROL 1 MMOL/ML IV SOLN COMPARISON:  11/29/2019 head CT. FINDINGS: Brain: No acute infarct. No acute intracranial hemorrhage. No midline shift, ventriculomegaly or extra-axial fluid collection. Diffuse dural thickening overlying the right cerebral convexity. Focal dural thickening overlying the left parietal lobe (18:117). 1.5 x 0.5 cm right dural-based lesion overlying the temporal convexity (18:67). There is also dural thickening along the clivus. Vascular: Preserved major intracranial flow voids. Skull and upper cervical spine: Multifocal T2 hyperintense enhancing calvarial lesions reflect osseous metastases. Enhancing 1.8 cm right frontal calvarial soft tissue (15:35) with dehiscence of the overlying cortex. 2.6 x 1.8 cm clival metastasis (15:10). Smaller enhancing osseous lesions are also seen involving the bilateral sphenoid wings and occipital condyles. Sinuses/Orbits: Normal orbits clear paranasal sinuses. Trace right mastoid effusion. Other: 1.4 x 0.8 cm enhancing soft tissue involving the left TMJ (18:40). Enhancing soft tissue infiltration involving the right temporalis muscle which is asymmetrically thickened (18:94). IMPRESSION: Dural-based metastatic disease demonstrating right predominance. Focal plaque-like dural lesions measuring up to 1.5 x 0.5 cm overlie the right temporal and left parietal convexities. Multifocal osseous metastases with dominant lesions involving the clivus, right frontal calvarium and left TMJ. Metastatic involvement of the right temporalis muscle. Electronically Signed   By: Primitivo Gauze M.D.   On: 11/29/2019 19:25   MR CERVICAL SPINE W WO CONTRAST  Result Date: 12/02/2019 CLINICAL DATA:  Metastatic breast cancer with possible pathologic fracture in the cervical spine. EXAM: MRI CERVICAL SPINE WITHOUT AND WITH CONTRAST  TECHNIQUE: Multiplanar and multiecho pulse sequences of the cervical spine, to include the craniocervical junction and cervicothoracic junction, were obtained without and with intravenous contrast. CONTRAST:  90mL GADAVIST GADOBUTROL 1 MMOL/ML IV SOLN COMPARISON:  Neck CT 12/01/2019. Brain MRI 11/29/2019. Thoracic spine MRI 11/18/2019. FINDINGS: Alignment: Minimal retrolisthesis of C5 on C6 and C6 on C7. Vertebrae: Diffusely abnormal bone marrow signal throughout the cervical spine, included upper thoracic spine, and skull base consistent with known widespread osseous metastatic  disease. Thin curvilinear region of low signal through the base of the dens suggestive of a nondisplaced fracture with possible extension more laterally in the C2 vertebral body. Extra osseous tumor extension about the C2 anterior and posterior elements. Cord: Normal signal. Posterior Fossa, vertebral arteries, paraspinal tissues: Preserved vertebral artery flow voids. Small prevertebral effusion. Previously described clival metastasis with overlying dural thickening extending into the upper cervical spine. No masslike cervical epidural tumor. Disc levels: Cervical disc degeneration greatest at C5-6 where a broad-based posterior disc osteophyte complex and infolding of the ligamentum flavum result in mild-to-moderate spinal stenosis and mild-to-moderate right and severe left neural foraminal stenosis. Disc bulging and mild spurring at C6-7 resulting in moderate right and mild left neural foraminal stenosis without significant spinal stenosis. IMPRESSION: 1. Widespread osseous metastatic disease. 2. Suspected nondisplaced pathologic fracture involving the C2 vertebral body and base of dens. 3. 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 No masslike cervical dural thickening/epidural tumor. 4. Cervical disc degeneration, worst at C5-6 where there is mild-to-moderate  spinal stenosis and severe left neural foraminal stenosis. These results will be called to the ordering clinician or representative by the Radiologist Assistant, and communication documented in the PACS or [employer]. Electronically Signed   By: Logan Bores M.D.   On: 12/02/2019 17:01   MR Thoracic Spine W Wo Contrast  Result Date: 11/18/2019 CLINICAL DATA:  Breast cancer EXAM: MRI THORACIC WITHOUT AND WITH CONTRAST TECHNIQUE: Multiplanar and multiecho pulse sequences of the thoracic spine were obtained without and with intravenous contrast. CONTRAST:  54mL GADAVIST GADOBUTROL 1 MMOL/ML IV SOLN COMPARISON:  10/24/2019 lumbar spine MRI FINDINGS: Cervical Localizer sequence is motion degraded. Therefore counting may not be accurate. Alignment: Trace retrolisthesis at T10-T11. Vertebrae: Heterogeneous STIR hyperintensity and enhancement throughout the thoracic spine. Mild compression deformity of T12 is unchanged since prior lumbar MRI. There is additional endplate irregularity and mild loss of height at T9 and T10. There is no significant epidural extension. Cord:  No abnormal signal.  No abnormal intrathecal enhancement. Paraspinal and other soft tissues: Extra-spinal metastatic disease is partially imaged and present on prior cross-sectional imaging. Disc levels: Mild multilevel degenerative disc disease and facet arthropathy. There is no high-grade degenerative stenosis. IMPRESSION: Diffuse osseous metastatic disease. No significant epidural extension. No definite acute compression deformity. Electronically Signed   By: Macy Mis M.D.   On: 11/18/2019 14:55   DG Chest Port 1 View  Result Date: 11/29/2019 CLINICAL DATA:  Possible sepsis, metastatic breast cancer EXAM: PORTABLE CHEST 1 VIEW COMPARISON:  05/07/2019 FINDINGS: Increased interstitial prominence with patchy density. No pleural effusion or pneumothorax. Cardiomediastinal contours are within normal limits with normal heart size.  IMPRESSION: Interstitial prominence and patchy density, at least a component of which likely reflects metastatic disease seen on recent chest CT. There may be superimposed edema or atypical infection. Electronically Signed   By: Macy Mis M.D.   On: 11/29/2019 14:45   ECHOCARDIOGRAM COMPLETE  Result Date: 12/02/2019    ECHOCARDIOGRAM REPORT   Patient Name:   Krista Doyle Date of Exam: 12/02/2019 Medical Rec #:  [id number]        Height:       63.0 in Accession #:    [id number]       Weight:       114.2 lb Date of Birth:  Jun 10, 1960       BSA:          1.524  m Patient Age:    70 years         BP:           131/68 mmHg Patient Gender: F                HR:           99 bpm. Exam Location:  [facility] Procedure: 2D Echo, Cardiac Doppler and Color Doppler Indications:    R94.31 Abnormal EKG; R00.0 Tachycardia  History:        Patient has no prior history of Echocardiogram examinations.                 Abnormal ECG. Breast cancer. Chemotherapy.  Sonographer:    Roseanna Rainbow RDCS (AE) Referring Phys: [phone] [facility]  Sonographer Comments: Technically difficult study due to poor echo windows. Rib artifact. Attempted to turn. Images off-axis. IMPRESSIONS  1. Left ventricular ejection fraction, by estimation, is 60 to 65%. The left ventricle has normal function. The left ventricle has no regional wall motion abnormalities. Left ventricular diastolic parameters are consistent with Grade I diastolic dysfunction (impaired relaxation).  2. Right ventricular systolic function is normal. The right ventricular size is normal.  3. The mitral valve is normal in structure. No evidence of mitral valve regurgitation. No evidence of mitral stenosis.  4. The aortic valve is tricuspid. Aortic valve regurgitation is not visualized. No aortic stenosis is present.  5. The inferior vena cava is normal in size with greater than 50% respiratory variability, suggesting right atrial pressure of 3 mmHg. FINDINGS  Left Ventricle: Left  ventricular ejection fraction, by estimation, is 60 to 65%. The left ventricle has normal function. The left ventricle has no regional wall motion abnormalities. The left ventricular internal cavity size was normal in size. There is  no left ventricular hypertrophy. Left ventricular diastolic parameters are consistent with Grade I diastolic dysfunction (impaired relaxation). Normal left ventricular filling pressure. Right Ventricle: The right ventricular size is normal. No increase in right ventricular wall thickness. Right ventricular systolic function is normal. Left Atrium: Left atrial size was normal in size. Right Atrium: Right atrial size was normal in size. Pericardium: There is no evidence of pericardial effusion. Mitral Valve: The mitral valve is normal in structure. No evidence of mitral valve regurgitation. No evidence of mitral valve stenosis. Tricuspid Valve: The tricuspid valve is normal in structure. Tricuspid valve regurgitation is not demonstrated. No evidence of tricuspid stenosis. Aortic Valve: The aortic valve is tricuspid. Aortic valve regurgitation is not visualized. No aortic stenosis is present. Aortic valve mean gradient measures 3.1 mmHg. Aortic valve peak gradient measures 6.4 mmHg. Aortic valve area, by VTI measures 1.83 cm. Pulmonic Valve: The pulmonic valve was not well visualized. Pulmonic valve regurgitation is not visualized. No evidence of pulmonic stenosis. Aorta: The aortic root is normal in size and structure. Pulmonary Artery: Indeterminant PASP, inadequate TR jet. Venous: The inferior vena cava is normal in size with greater than 50% respiratory variability, suggesting right atrial pressure of 3 mmHg. IAS/Shunts: No atrial level shunt detected by color flow Doppler.  LEFT VENTRICLE PLAX 2D LVIDd:         3.36 cm     Diastology LVIDs:         2.16 cm     LV e' medial:   6.96 cm/s LV PW:         1.03 cm     LV E/e' medial: 12.4 LV IVS:  0.94 cm LVOT diam:     1.70 cm LV  SV:         38 LV SV Index:   25 LVOT Area:     2.27 cm  LV Volumes (MOD) LV vol d, MOD A2C: 55.0 ml LV vol d, MOD A4C: 32.5 ml LV vol s, MOD A2C: 18.4 ml LV vol s, MOD A4C: 11.3 ml LV SV MOD A2C:     36.6 ml LV SV MOD A4C:     32.5 ml LV SV MOD BP:      32.0 ml RIGHT VENTRICLE RV S prime:     11.30 cm/s TAPSE (M-mode): 1.5 cm LEFT ATRIUM           Index      RIGHT ATRIUM          Index LA diam:      2.20 cm 1.44 cm/m RA Area:     7.17 cm LA Vol (A4C): 14.0 ml 9.19 ml/m RA Volume:   12.20 ml 8.01 ml/m  AORTIC VALVE AV Area (Vmax):    1.81 cm AV Area (Vmean):   1.59 cm AV Area (VTI):     1.83 cm AV Vmax:           126.43 cm/s AV Vmean:          81.029 cm/s AV VTI:            0.208 m AV Peak Grad:      6.4 mmHg AV Mean Grad:      3.1 mmHg LVOT Vmax:         101.00 cm/s LVOT Vmean:        56.600 cm/s LVOT VTI:          0.168 m LVOT/AV VTI ratio: 0.81  AORTA Ao Root diam: 2.80 cm Ao Asc diam:  3.20 cm MITRAL VALVE MV Area (PHT): 4.21 cm    SHUNTS MV Decel Time: 180 msec    Systemic VTI:  0.17 m MV E velocity: 86.50 cm/s  Systemic Diam: 1.70 cm MV A velocity: 90.80 cm/s MV E/A ratio:  0.95 Carlyle Dolly MD Electronically signed by Carlyle Dolly MD Signature Date/Time: 12/02/2019/3:28:22 PM    Final    [place] CORE BIOPSY (LIVER)  Result Date: 11/19/2019 INDICATION: History of breast carcinoma with new multiple liver lesions, lung nodules and bone lesions consistent with recurrent metastatic disease. Biopsy of a liver lesion has been requested to confirm metastatic disease and perform molecular prognostic studies. EXAM: ULTRASOUND GUIDED CORE BIOPSY OF LIVER MEDICATIONS: None. ANESTHESIA/SEDATION: Fentanyl 100 mcg IV; Versed 2.0 mg IV Moderate Sedation Time:  16 minutes. The patient was continuously monitored during the procedure by the interventional radiology nurse under my direct supervision. PROCEDURE: The procedure, risks, benefits, and alternatives were explained to the patient. Questions regarding the  procedure were encouraged and answered. The patient understands and consents to the procedure. A time-out was performed prior to initiating the procedure. The abdominal wall was prepped with chlorhexidine in a sterile fashion, and a sterile drape was applied covering the operative field. A sterile gown and sterile gloves were used for the procedure. Local anesthesia was provided with 1% Lidocaine. Ultrasound was used to localize liver lesions. After choosing a lesion in the right lobe of the liver, a 17 gauge needle was advanced into the liver to the margin of the lesion. Three separate coaxial 18 gauge core biopsy samples were obtained through the lesion. Core biopsy samples were submitted in formalin.  Gel-Foam pledgets were advanced through the outer needle as the needle was retracted and removed. Additional ultrasound was performed. COMPLICATIONS: None immediate. FINDINGS: There are multiple hypoechoic solid round and oval masses scattered throughout the liver parenchyma. The largest localized by ultrasound measures approximately 2 cm in diameter and lies within the anterior aspect of the right lobe near the gallbladder fossa. This lesion was sampled yielding solid tissue. IMPRESSION: Ultrasound-guided core biopsy performed of a 2 cm lesion within the right lobe of the liver. Electronically Signed   By: Aletta Edouard M.D.   On: 11/19/2019 15:42     Microbiology: Recent Results (from the past 240 hour(s))  Respiratory Panel by RT PCR (Flu A&B, Covid) - Nasopharyngeal Swab     Status: None   Collection Time: 11/29/19  2:05 PM   Specimen: Nasopharyngeal Swab  Result Value Ref Range Status   SARS Coronavirus 2 by RT PCR NEGATIVE NEGATIVE Final    Comment: (NOTE) SARS-CoV-2 target nucleic acids are NOT DETECTED.  The SARS-CoV-2 RNA is generally detectable in upper respiratoy specimens during the acute phase of infection. The lowest concentration of SARS-CoV-2 viral copies this assay can detect is  131 copies/mL. A negative result does not preclude SARS-Cov-2 infection and should not be used as the sole basis for treatment or other patient management decisions. A negative result may occur with  improper specimen collection/handling, submission of specimen other than nasopharyngeal swab, presence of viral mutation(s) within the areas targeted by this assay, and inadequate number of viral copies (<131 copies/mL). A negative result must be combined with clinical observations, patient history, and epidemiological information. The expected result is Negative.  Fact Sheet for Patients:  PinkCheek.be  Fact Sheet for Healthcare Providers:  GravelBags.it  This test is no t yet approved or cleared by the [place] FDA and  has been authorized for detection and/or diagnosis of SARS-CoV-2 by FDA under an Emergency Use Authorization (EUA). This EUA will remain  in effect (meaning this test can be used) for the duration of the COVID-19 declaration under Section 564(b)(1) of the Act, 21 U.S.C. section 360bbb-3(b)(1), unless the authorization is terminated or revoked sooner.     Influenza A by PCR NEGATIVE NEGATIVE Final   Influenza B by PCR NEGATIVE NEGATIVE Final    Comment: (NOTE) The Xpert Xpress SARS-CoV-2/FLU/RSV assay is intended as an aid in  the diagnosis of influenza from Nasopharyngeal swab specimens and  should not be used as a sole basis for treatment. Nasal washings and  aspirates are unacceptable for Xpert Xpress SARS-CoV-2/FLU/RSV  testing.  Fact Sheet for Patients: PinkCheek.be  Fact Sheet for Healthcare Providers: GravelBags.it  This test is not yet approved or cleared by the [place] FDA and  has been authorized for detection and/or diagnosis of SARS-CoV-2 by  FDA under an Emergency Use Authorization (EUA). This EUA will remain  in effect  (meaning this test can be used) for the duration of the  Covid-19 declaration under Section 564(b)(1) of the Act, 21  U.S.C. section 360bbb-3(b)(1), unless the authorization is  terminated or revoked. Performed at [facility], [address], [place], [place] [address]   Blood culture (routine single)     Status: None   Collection Time: 11/29/19  2:45 PM   Specimen: BLOOD  Result Value Ref Range Status   Specimen Description BLOOD  Final   Special Requests NONE  Final   Culture   Final    NO GROWTH 5 DAYS Performed at [facility], [address], [place], [place] [address]    Report Status 12/04/2019 FINAL  Final  Urine culture     Status: None   Collection Time: 11/29/19  3:50 PM   Specimen: In/Out Cath Urine  Result Value Ref Range Status   Specimen Description   Final    IN/OUT CATH URINE Performed at [facility], [address], [place], [place] [address]    Special Requests   Final    NONE Performed at [facility], [address], [place], [place] [address]    Culture   Final    NO GROWTH Performed at [facility], [place] [address], [place], [place] [address]    Report Status 12/01/2019 FINAL  Final  MRSA PCR Screening     Status: Abnormal   Collection Time: 11/30/19 12:04 AM   Specimen: Nasopharyngeal  Result Value Ref Range Status   MRSA by PCR POSITIVE (A) NEGATIVE Final    Comment:        The GeneXpert MRSA Assay (FDA approved for NASAL specimens only), is one component of a comprehensive MRSA colonization surveillance program. It is not intended to diagnose MRSA infection nor to guide or monitor treatment for MRSA infections. RESULT CALLED TO, READ BACK BY AND VERIFIED WITH: ALSTON @ [id number] ON [id number] BY HENDERSON L. Performed at [facility], [address], [place], [place] [address]      Labs: Basic Metabolic Panel: Recent Labs  Lab 12/01/19 0845 12/01/19 0845 12/02/19 0518 12/02/19 0518 12/03/19 0420 12/03/19 0420 12/04/19 0920 12/05/19  0502  NA 133*  --  137  --  134*  --  133* 136  K 3.1*   < > 3.8   < > 3.6   < > 3.4* 4.0  CL 102  --  106  --  105  --  103 107  CO2 22  --  21*  --  20*  --  20* 19*  GLUCOSE 82  --  79  --  117*  --  135* 85  BUN 10  --  12  --  17  --  23 15  CREATININE 0.62  --  0.65  --  0.78  --  0.89 0.62  CALCIUM 8.8*  --  8.3*  --  8.1*  --  8.1* 8.1*  MG  --   --  1.4*  --   --   --   --  1.8   < > = values in this interval not displayed.   Liver Function Tests: Recent Labs  Lab 11/29/19 1054 11/30/19 0457 12/01/19 0845 12/04/19 0920 12/05/19 0502  AST 49* 39 49* 90* 117*  ALT $Re'15 14 16 31 'lJM$ 47*  ALKPHOS 172* 140* 162* 175* 184*  BILITOT 0.6 0.5 0.3 0.3 0.6  PROT 8.6* 7.3 8.0 7.7 7.3  ALBUMIN 3.1* 2.5* 2.7* 2.6* 2.6*   No results for input(s): LIPASE, AMYLASE in the last 168 hours. No results for input(s): AMMONIA in the last 168 hours. CBC: Recent Labs  Lab 11/28/19 1302 11/28/19 1302 11/29/19 1437 11/29/19 1437 11/30/19 0457 12/01/19 0845 12/03/19 0420 12/04/19 0920 12/05/19 0502  WBC 9.5   < > 6.3   < > 5.8 9.6 8.7 8.3 8.8  NEUTROABS 5.5  --  5.5  --   --   --   --   --   --   HGB 10.7*   < > 10.0*   < > 8.6* 8.7* 9.1* 8.5* 8.5*  HCT  34.3*   < > 33.1*   < > 28.7* 27.9* 29.8* 28.0* 28.3*  MCV 83.3   < > 85.3   < > 85.9 82.5 83.9 84.6 84.2  PLT 449*   < > 367   < > 317 311 329 312 296   < > = values in this interval not displayed.   Cardiac Enzymes: No results for input(s): CKTOTAL, CKMB, CKMBINDEX, TROPONINI in the last 168 hours. BNP: Invalid input(s): POCBNP CBG: Recent Labs  Lab 12/04/19 1134 12/04/19 1652 12/04/19 2115 12/05/19 0752 12/05/19 1136  GLUCAP 162* 85 94 81 119*    Time coordinating discharge:  36 minutes  Signed:  Orson Eva, DO [facility] Pager: [phone] 12/05/2019, 12:16 PM

## 2019-12-05 NOTE — Plan of Care (Signed)

## 2019-12-05 NOTE — TOC Progression Note (Addendum)
Transition of Care [facility]) - Progression Note    Patient Details  Name: Krista Doyle MRN: [id number] Date of Birth: 1960/07/27  Transition of Care [facility]) CM/SW Contact  Natasha Bence, LCSW Phone Number: 12/05/2019, 4:00 PM  Clinical Narrative:    [name] notified CSW that insurance auth had not been started due to a delay with [employer]. CSW discussed with [name] about starting auth under patients's [employer]. CSW started auth for [facility] and received approval. Ref number [id number]. Per rep, auth number to be generated in the nexed 24-48 hours. CSW contacted Debbie with [employer] to notify her of patient's auth approval. Debbie agreeable to take patient on 12/05/2019. CSW contacted patient's family to notify them of discharge. Debbie contacted CSW following intial phone call and reported that she did not have staff to clean patient's room and would not be able to take patient until 12/06/2019. CSW notified patient's family of delay in d/c. TOC to follow.    Expected Discharge Plan: [place] Barriers to Discharge: Continued Medical Work up, Ship broker  Expected Discharge Plan and Services Expected Discharge Plan: [place] In-house Referral: NA Discharge Planning Services: NA Post Acute Care Choice: Durable Medical Equipment Living arrangements for the past 2 months: Single Family Home                                       Social Determinants of Health (SDOH) Interventions    Readmission Risk Interventions Readmission Risk Prevention Plan 12/02/2019  Transportation Screening Complete  HRI or [place] Complete  Social Work Consult for [place] Planning/Counseling Complete  Palliative Care Screening Not Applicable  Medication Review Press photographer) Complete  Some recent data might be hidden

## 2019-12-06 LAB — GLUCOSE, CAPILLARY: Glucose-Capillary: 100 mg/dL — ABNORMAL HIGH (ref 70–99)

## 2019-12-06 MED ORDER — METOPROLOL TARTRATE 50 MG PO TABS: 100.0000 mg | ORAL_TABLET | Freq: Two times a day (BID) | ORAL | Status: DC

## 2019-12-06 MED ORDER — METOPROLOL TARTRATE 50 MG PO TABS
50.0000 mg | ORAL_TABLET | Freq: Once | ORAL | Status: AC
  Administered 2019-12-06: 50 mg via ORAL
  Filled 2019-12-06: qty 1

## 2019-12-06 MED ORDER — METOPROLOL TARTRATE 100 MG PO TABS
100.0000 mg | ORAL_TABLET | Freq: Two times a day (BID) | ORAL | Status: DC
Start: 2019-12-06 — End: 2019-12-12

## 2019-12-06 NOTE — TOC Transition Note (Signed)
Transition of Care [facility]) - CM/SW Discharge Note   Patient Details  Name: Krista Doyle MRN: [id number] Date of Birth: 1960/07/22  Transition [facility] Care [facility]) CM/SW Contact:  Natasha Bence, LCSW Phone Number: 12/06/2019, 10:40 AM   Clinical Narrative:    CSW comfirmed Debbie able to take patient on 11/5. CSW called EMS and completed med necessity form. Nurse to call report. TOC signing off.  Final next level of care: Home/Self Care Barriers to Discharge: Continued Medical Work up, Ship broker   Patient Goals and CMS Choice Patient states their goals for this hospitalization and ongoing recovery are:: Return home   Choice offered to / list presented to : NA  Discharge Placement                       Discharge Plan and Services In-house Referral: NA Discharge Planning Services: NA Post Acute Care Choice: Durable Medical Equipment                               Social Determinants of Health (SDOH) Interventions     Readmission Risk Interventions Readmission Risk Prevention Plan 12/06/2019 12/02/2019  Transportation Screening Complete Complete  PCP or Specialist Appt within 3-5 Days Complete -  HRI or [place] Complete Complete  Social Work Consult for [place] Planning/Counseling Complete Complete  Palliative Care Screening Complete Not Applicable  Medication Review Press photographer) Complete Complete  Some recent data might be hidden

## 2019-12-06 NOTE — Plan of Care (Signed)

## 2019-12-06 NOTE — Progress Notes (Signed)
Physical Therapy Treatment Patient Details Name: Krista Doyle MRN: [id number] DOB: 1960/07/31 Today's Date: 12/06/2019    History of Present Illness Krista Doyle  is a 59 y.o. female, with past medical history of hypertension, hyperlipidemia, chemotherapy-induced neuropathy, diabetes mellitus, history of breast cancer visually diagnosed in 2008, status post radiation therapy and chemotherapy,  with recent diagnosis of metastatic disease to liver and osseous metastasis, patient was sent from oncology office for hypercalcemia, patient was recently diagnosed with lumbar/thoracic spine osseous metastasis, liver metastasis as well, patient work-up was significant for hypercalcemia at oncology office yesterday, where she received IV fluids and IV Zometa, today as well she received IV fluids and calcitonin in oncology office and sent to ED for further management, husband reports patient has more altered and confused over last 24 hours, as well has been weaker than her baseline, she has been reporting headache for 3 weeks, and has poor oral appetite, and oral intake, she has chronic nausea, but no vomiting.- in ED her calcium came<15, elevated at 1.3, baseline 0.8, potassium low at 3.3, MRI brain significant for 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    PT Comments    Patient present standing without c/s brace on with spouse assisting.  Patient agreeable for therapy and demonstrates increased endurance/distance for gait training with slow labored cadence, frequent standing rest breaks and requires much time to make turns.  Patient tolerated sitting up in chair after therapy with her spouse present in room.  Patient and her spouse informed in the importance of wearing her [place] neck brace especially when getting out of bed to avoid another pathologic fracture in neck with understanding acknowledged.  Patient  will benefit from continued physical therapy in hospital and recommended venue below to increase strength, balance, endurance for safe ADLs and gait.    Follow Up Recommendations  SNF     Equipment Recommendations  None recommended by PT    Recommendations for Other Services       Precautions / Restrictions Precautions Precautions: Fall Precaution Comments: Pathologic C2 fracture Required Braces or Orthoses: Cervical Brace Cervical Brace: Hard collar;At all times Restrictions Weight Bearing Restrictions: No    Mobility  Bed Mobility               General bed mobility comments: presents standing with spouse assisting  Transfers Overall transfer level: Needs assistance Equipment used: Rolling walker (2 wheeled) Transfers: Sit to/from [facility] Sit to Stand: Min assist Stand pivot transfers: Min assist       General transfer comment: slow labored movement  Ambulation/Gait Ambulation/Gait assistance: Min assist;Mod assist Gait Distance (Feet): 35 Feet Assistive device: Rolling walker (2 wheeled) Gait Pattern/deviations: Decreased step length - right;Decreased step length - left;Decreased stride length Gait velocity: decreased   General Gait Details: very slow labored cadence with short step/stride length, requires much time to make turns and requires frequent standing rest breaks due to fatigue   Stairs             Wheelchair Mobility    Modified Rankin (Stroke Patients Only)       Balance Overall balance assessment: Needs assistance Sitting-balance support: Feet supported;No upper extremity supported Sitting balance-Leahy Scale: Fair Sitting balance - Comments: fair/good seated EOB   Standing balance support: During functional activity;Bilateral upper extremity supported Standing balance-Leahy Scale: Fair Standing balance comment: using RW  Cognition Arousal/Alertness:  Awake/alert Behavior During Therapy: WFL for tasks assessed/performed Overall Cognitive Status: Within Functional Limits for tasks assessed                                        Exercises      General Comments        Pertinent Vitals/Pain Pain Assessment: No/denies pain    Home Living                      Prior Function            PT Goals (current goals can now be found in the care plan section) Acute Rehab PT Goals Patient Stated Goal: return home with family to assist PT Goal Formulation: With patient/family Time For Goal Achievement: 12/17/19 Potential to Achieve Goals: Good Progress towards PT goals: Progressing toward goals    Frequency    Min 3X/week      PT Plan Current plan remains appropriate    Co-evaluation              AM-PAC PT "6 Clicks" Mobility   Outcome Measure  Help needed turning from your back to your side while in a flat bed without using bedrails?: A Little Help needed moving from lying on your back to sitting on the side of a flat bed without using bedrails?: A Lot Help needed moving to and from a bed to a chair (including a wheelchair)?: A Lot Help needed standing up from a chair using your arms (e.g., wheelchair or bedside chair)?: A Little Help needed to walk in hospital room?: A Lot Help needed climbing 3-5 steps with a railing? : A Lot 6 Click Score: 14    End of Session Equipment Utilized During Treatment: Gait belt Activity Tolerance: Patient tolerated treatment well;Patient limited by fatigue Patient left: in chair;with call bell/phone within reach;with family/visitor present Nurse Communication: Mobility status PT Visit Diagnosis: Unsteadiness on feet (R26.81);Other abnormalities of gait and mobility (R26.89);Muscle weakness (generalized) (M62.81)     Time: 2426-8341 PT Time Calculation (min) (ACUTE ONLY): 23 min  Charges:  $Gait Training: 8-22 mins $Therapeutic Activity: 8-22 mins                      11:11 AM, 12/06/19 Lonell Grandchild, MPT Physical Therapist with [facility] [phone] office [phone] mobile phone

## 2019-12-06 NOTE — Progress Notes (Signed)
PROGRESS NOTE  ASAL TEAS QZR:[id number] DOB: 1960-08-27 DOA: 11/29/2019 PCP: [name], MD  Brief History:  59 y.o.female,with past medical history of hypertension, hyperlipidemia, chemotherapy-induced neuropathy, diabetes mellitus, history of breast cancer visually diagnosed in 2008, status post radiation therapy and chemotherapy, with recent diagnosis of metastatic disease to liver and osseous metastasis, patient was sent from oncology office for hypercalcemia, patient was recently diagnosed with lumbar/thoracic spine osseous metastasis, liver metastasis as well, patient work-up was significant for hypercalcemia at oncology office yesterday, where she received IV fluids and IV Zometa, today as well she received IV fluids and calcitonin in oncology office and sent to ED for further management, husband reports patient has more altered and confused over last 24 hours, as well has been weaker than her baseline, she has been reporting headache for 3 weeks, and has poor oral appetite, and oral intake, she has chronic nausea, but no vomiting. - in EDher calcium came>15,elevated at 1.3, baseline 0.8, potassium low at 3.3, MRI brain significant for 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  Assessment/Plan: Hypercalcemiaof malignancy -Patient admitted with severe hypercalcemia greater than 15 in the setting of metastatic breast cancer -She received Zometa prior to admission -She was also started on calcitonin -She was aggressively hydrated with IV fluids -Overall serum calcium has normalized -PTH was in normal range, PTH related peptide in process -continued IVF for now -corrected Ca--9.22 at time of d/c  Metastatic breast cancer -Evidence of metastases to liver, bone, lung -MRI brain showed evidence of skull metastases. Discussed with oncology and felt to be unlikely related to  leptomeningeal spread or intracranial spread. No midline shift or mass-effect on imaging -Plans are to follow-up with Dr. Delton Coombes for continued treatments -Palliative care consulted to help address goals of care/pain management--continue full scope of care -Currently, patient and family are processing her diagnosis and she was recently diagnosed with metastatic cancer within the past few weeks -follow up appointment with med/onc, Dr. Delton Coombes 11/10 at 10AM  Uncontrolled pain -increase fentanyl TD to 186mcg -change dilaudid to q 6 hours scheduled  Acute metabolic encephalopathy -Related to hypercalcemia -Mental status improving and appears to be back to baseline  Hypokalemia -Replaced -check mag--1.8  Acute kidney injury -Creatinine of 0.8 at baseline -Peak creatinine up to 1.6 -This has since trended down with IV fluids -serum creatinine 0.62 at time of d/c  Insulin-dependent diabetes.  -On Lantus and sliding scale insulin -decreased lantus to 10 units at hs -Continue to follow blood sugars -Blood sugars stable -11/29/19 A1C--6.7  Chronic pain syndrome secondary to underlying metastatic breast cancer  -Reminded her that Dilaudid was ordered every 4 hours as needed -Continue with Lyrica and Robaxin -Palliative care following and assisting with pain management -increase fentanyl TD to 154mcg -change dilaudid to q 6 hours  Opioid-induced constipation -Continue with Movantik  Sinus tachycardia -Heart rate persistently mildly elevated -D-dimer is elevated -CT angiogram of the chest performed was negative for pulmonary embolus -It did demonstrate widespread metastatic disease -Echocardiogram shows normal ejection fraction -Suspect that her tachycardia is related to painand volume depletion -Continue pain management -continue metoprolol--increase to 100 mg bid -11/5--personally reviewed EKG and tele with cardiology--sinus tachycardia, no concerning STT  changes -patient asymptomatic--increase metoprolol to 100 mg bid  Cervical spine fracture of C2 -Pathologic fracture in the setting of metastatic disease -Reviewed images with neurosurgery Dr. Zada Finders who did not recommend  any surgical management at this time. -Recommendations were to place the patient in a cervical collar with plans for outpatient neurosurgical follow-up -Activity as tolerated -Seen by physical therapy with recommendations for skilled nursing facility placement           Family Communication:  Spouse updated 11/5  Consultants:  Palliative medicine Code Status:  FULL   DVT Prophylaxis:  [place] Heparin    Procedures: As Listed in Progress Note Above  Antibiotics: None      Subjective: Patient continues to complain of pain all over.  Denies cp, sob, n/v/d, f/c  Objective: Vitals:   12/05/19 2118 12/06/19 0419 12/06/19 0432 12/06/19 0751  BP: (!) 144/83 (!) 192/101 (!) 150/74   Pulse: 100 (!) 162 (!) 130   Resp: 16 20 20    Temp: 98.6 F (37 C) 98.8 F (37.1 C)    TempSrc: Oral Oral    SpO2: 97% 99% 99% 98%  Weight:        Intake/Output Summary (Last 24 hours) at 12/06/2019 0951 Last data filed at 12/05/2019 1800 Gross per 24 hour  Intake 240 ml  Output --  Net 240 ml   Weight change:  Exam:   General:  Pt is alert, follows commands appropriately, not in acute distress  HEENT: No icterus, No thrush, No neck mass, [place]/AT  Cardiovascular: RRR, S1/S2, no rubs, no gallops  Respiratory: CTA bilaterally, no wheezing, no crackles, no rhonchi  Abdomen: Soft/+BS, non tender, non distended, no guarding  Extremities: No edema, No lymphangitis, No petechiae, No rashes, no synovitis   Data Reviewed: I have personally reviewed following labs and imaging studies Basic Metabolic Panel: Recent Labs  Lab 12/01/19 0845 12/02/19 0518 12/03/19 0420 12/04/19 0920 12/05/19 0502  NA 133* 137 134* 133* 136  K 3.1* 3.8 3.6 3.4* 4.0  CL 102 106  105 103 107  CO2 22 21* 20* 20* 19*  GLUCOSE 82 79 117* 135* 85  BUN 10 12 17 23 15   CREATININE 0.62 0.65 0.78 0.89 0.62  CALCIUM 8.8* 8.3* 8.1* 8.1* 8.1*  MG  --  1.4*  --   --  1.8   Liver Function Tests: Recent Labs  Lab 11/29/19 1054 11/30/19 0457 12/01/19 0845 12/04/19 0920 12/05/19 0502  AST 49* 39 49* 90* 117*  ALT 15 14 16 31  47*  ALKPHOS 172* 140* 162* 175* 184*  BILITOT 0.6 0.5 0.3 0.3 0.6  PROT 8.6* 7.3 8.0 7.7 7.3  ALBUMIN 3.1* 2.5* 2.7* 2.6* 2.6*   No results for input(s): LIPASE, AMYLASE in the last 168 hours. No results for input(s): AMMONIA in the last 168 hours. Coagulation Profile: Recent Labs  Lab 11/29/19 1437 11/30/19 0457  INR 1.1 1.3*   CBC: Recent Labs  Lab 11/29/19 1437 11/29/19 1437 11/30/19 0457 12/01/19 0845 12/03/19 0420 12/04/19 0920 12/05/19 0502  WBC 6.3   < > 5.8 9.6 8.7 8.3 8.8  NEUTROABS 5.5  --   --   --   --   --   --   HGB 10.0*   < > 8.6* 8.7* 9.1* 8.5* 8.5*  HCT 33.1*   < > 28.7* 27.9* 29.8* 28.0* 28.3*  MCV 85.3   < > 85.9 82.5 83.9 84.6 84.2  PLT 367   < > 317 311 329 312 296   < > = values in this interval not displayed.   Cardiac Enzymes: No results for input(s): CKTOTAL, CKMB, CKMBINDEX, TROPONINI in the last 168 hours. BNP: Invalid input(s): POCBNP  CBG: Recent Labs  Lab 12/05/19 0752 12/05/19 1136 12/05/19 1713 12/05/19 2130 12/06/19 0737  GLUCAP 81 119* 105* 99 100*   HbA1C: No results for input(s): HGBA1C in the last 72 hours. Urine analysis:    Component Value Date/Time   COLORURINE STRAW (A) 11/29/2019 1550   APPEARANCEUR CLEAR 11/29/2019 1550   LABSPEC 1.008 11/29/2019 1550   PHURINE 7.0 11/29/2019 1550   GLUCOSEU NEGATIVE 11/29/2019 1550   HGBUR NEGATIVE 11/29/2019 1550   BILIRUBINUR NEGATIVE 11/29/2019 1550   KETONESUR NEGATIVE 11/29/2019 1550   PROTEINUR NEGATIVE 11/29/2019 1550   NITRITE NEGATIVE 11/29/2019 1550   LEUKOCYTESUR NEGATIVE 11/29/2019 1550   Sepsis  Labs: @LABRCNTIP (procalcitonin:4,lacticidven:4) ) Recent Results (from the past 240 hour(s))  Respiratory Panel by RT PCR (Flu A&B, Covid) - Nasopharyngeal Swab     Status: None   Collection Time: 11/29/19  2:05 PM   Specimen: Nasopharyngeal Swab  Result Value Ref Range Status   SARS Coronavirus 2 by RT PCR NEGATIVE NEGATIVE Final    Comment: (NOTE) SARS-CoV-2 target nucleic acids are NOT DETECTED.  The SARS-CoV-2 RNA is generally detectable in upper respiratoy specimens during the acute phase of infection. The lowest concentration of SARS-CoV-2 viral copies this assay can detect is 131 copies/mL. A negative result does not preclude SARS-Cov-2 infection and should not be used as the sole basis for treatment or other patient management decisions. A negative result may occur with  improper specimen collection/handling, submission of specimen other than nasopharyngeal swab, presence of viral mutation(s) within the areas targeted by this assay, and inadequate number of viral copies (<131 copies/mL). A negative result must be combined with clinical observations, patient history, and epidemiological information. The expected result is Negative.  Fact Sheet for Patients:  PinkCheek.be  Fact Sheet for Healthcare Providers:  GravelBags.it  This test is no t yet approved or cleared by the [place] FDA and  has been authorized for detection and/or diagnosis of SARS-CoV-2 by FDA under an Emergency Use Authorization (EUA). This EUA will remain  in effect (meaning this test can be used) for the duration of the COVID-19 declaration under Section 564(b)(1) of the Act, 21 U.S.C. section 360bbb-3(b)(1), unless the authorization is terminated or revoked sooner.     Influenza A by PCR NEGATIVE NEGATIVE Final   Influenza B by PCR NEGATIVE NEGATIVE Final    Comment: (NOTE) The Xpert Xpress SARS-CoV-2/FLU/RSV assay is intended as an  aid in  the diagnosis of influenza from Nasopharyngeal swab specimens and  should not be used as a sole basis for treatment. Nasal washings and  aspirates are unacceptable for Xpert Xpress SARS-CoV-2/FLU/RSV  testing.  Fact Sheet for Patients: PinkCheek.be  Fact Sheet for Healthcare Providers: GravelBags.it  This test is not yet approved or cleared by the [place] FDA and  has been authorized for detection and/or diagnosis of SARS-CoV-2 by  FDA under an Emergency Use Authorization (EUA). This EUA will remain  in effect (meaning this test can be used) for the duration of the  Covid-19 declaration under Section 564(b)(1) of the Act, 21  U.S.C. section 360bbb-3(b)(1), unless the authorization is  terminated or revoked. Performed at [facility], [address], [place], [place] [address]   Blood culture (routine single)     Status: None   Collection Time: 11/29/19  2:45 PM   Specimen: BLOOD  Result Value Ref Range Status   Specimen Description BLOOD  Final   Special Requests NONE  Final   Culture   Final  NO GROWTH 5 DAYS Performed at [facility], [address] [place], [place] [address]    Report Status 12/04/2019 FINAL  Final  Urine culture     Status: None   Collection Time: 11/29/19  3:50 PM   Specimen: In/Out Cath Urine  Result Value Ref Range Status   Specimen Description   Final    IN/OUT CATH URINE Performed at [facility], [address] [place], [place] [address]    Special Requests   Final    NONE Performed at [facility], [address], [place], [place] [address]    Culture   Final    NO GROWTH Performed at [facility], [place] [address] [place], [place] [address]    Report Status 12/01/2019 FINAL  Final  MRSA PCR Screening     Status: Abnormal   Collection Time: 11/30/19 12:04 AM   Specimen: Nasopharyngeal  Result Value Ref Range Status   MRSA by PCR POSITIVE (A) NEGATIVE Final     Comment:        The GeneXpert MRSA Assay (FDA approved for NASAL specimens only), is one component of a comprehensive MRSA colonization surveillance program. It is not intended to diagnose MRSA infection nor to guide or monitor treatment for MRSA infections. RESULT CALLED TO, READ BACK BY AND VERIFIED WITH: ALSTON @ [id number] ON [id number] BY HENDERSON L. Performed at [facility], [address] [place],  [address]   Respiratory Panel by RT PCR (Flu A&B, Covid) - Nasopharyngeal Swab     Status: None   Collection Time: 12/05/19  3:28 PM   Specimen: Nasopharyngeal Swab  Result Value Ref Range Status   SARS Coronavirus 2 by RT PCR NEGATIVE NEGATIVE Final    Comment: (NOTE) SARS-CoV-2 target nucleic acids are NOT DETECTED.  The SARS-CoV-2 RNA is generally detectable in upper respiratoy specimens during the acute phase of infection. The lowest concentration of SARS-CoV-2 viral copies this assay can detect is 131 copies/mL. A negative result does not preclude SARS-Cov-2 infection and should not be used as the sole basis for treatment or other patient management decisions. A negative result may occur with  improper specimen collection/handling, submission of specimen other than nasopharyngeal swab, presence of viral mutation(s) within the areas targeted by this assay, and inadequate number of viral copies (<131 copies/mL). A negative result must be combined with clinical observations, patient history, and epidemiological information. The expected result is Negative.  Fact Sheet for Patients:  PinkCheek.be  Fact Sheet for Healthcare Providers:  GravelBags.it  This test is no t yet approved or cleared by the [place] FDA and  has been authorized for detection and/or diagnosis of SARS-CoV-2 by FDA under an Emergency Use Authorization (EUA). This EUA will remain  in effect (meaning this test can be used) for the duration of  the COVID-19 declaration under Section 564(b)(1) of the Act, 21 U.S.C. section 360bbb-3(b)(1), unless the authorization is terminated or revoked sooner.     Influenza A by PCR NEGATIVE NEGATIVE Final   Influenza B by PCR NEGATIVE NEGATIVE Final    Comment: (NOTE) The Xpert Xpress SARS-CoV-2/FLU/RSV assay is intended as an aid in  the diagnosis of influenza from Nasopharyngeal swab specimens and  should not be used as a sole basis for treatment. Nasal washings and  aspirates are unacceptable for Xpert Xpress SARS-CoV-2/FLU/RSV  testing.  Fact Sheet for Patients: PinkCheek.be  Fact Sheet for Healthcare Providers: GravelBags.it  This test is not yet approved or cleared by the  [place] Architectural technologist and  has been authorized for detection and/or diagnosis of SARS-CoV-2 by  FDA under an Print production planner (EUA). This EUA will remain  in effect (meaning this test can be used) for the duration of the  Covid-19 declaration under Section 564(b)(1) of the Act, 21  U.S.C. section 360bbb-3(b)(1), unless the authorization is  terminated or revoked. Performed at [facility], [address], [place], [place] [address]      Scheduled Meds: . amitriptyline  20 mg Oral QHS  . Chlorhexidine Gluconate Cloth  6 each Topical Daily  . feeding supplement  1 Container Oral TID BM  . fentaNYL  1 patch Transdermal Q72H  . heparin  5,000 Units Subcutaneous Q8H  . HYDROmorphone  4 mg Oral Q6H  . insulin aspart  0-5 Units Subcutaneous QHS  . insulin aspart  0-9 Units Subcutaneous TID WC  . insulin glargine  10 Units Subcutaneous QHS  . letrozole  2.5 mg Oral Daily  . magic mouthwash w/lidocaine  15 mL Oral TID  . megestrol  20 mg Oral BID  . metoprolol tartrate  100 mg Oral BID  . metoprolol tartrate  50 mg Oral Once  . mometasone-formoterol  2 puff Inhalation BID  . mupirocin ointment   Nasal BID  . naloxegol oxalate  25 mg Oral Daily   . pregabalin  100 mg Oral BID  . senna  8.6 mg Oral Daily   Continuous Infusions: . 0.9 % NaCl with KCl 20 mEq / L 125 mL/hr at 12/05/19 1021    Procedures/Studies: CT Head Wo Contrast  Result Date: 11/29/2019 CLINICAL DATA:  Delirium EXAM: CT HEAD WITHOUT CONTRAST TECHNIQUE: Contiguous axial images were obtained from the base of the skull through the vertex without intravenous contrast. COMPARISON:  None. FINDINGS: Brain: No acute infarct or intracranial hemorrhage. No mass lesion. No midline shift, ventriculomegaly or extra-axial fluid collection. Vascular: No hyperdense vessel or unexpected calcification. Carotid siphon atherosclerotic calcifications. Skull: Mottled, heterogenous appearance of the bifrontal, right parietal calvarium and clivus. There is a 1.6 cm right calvarial soft tissue focus with dehiscence of the bone (2:18). Sinuses/Orbits: Normal orbits. Clear paranasal sinuses. No mastoid effusion. Other: Asymmetric prominence of the right temporalis muscle (2:12). IMPRESSION: No acute infarct or intracranial hemorrhage. Right frontal calvarial soft tissue, asymmetric prominence of the right temporalis muscle and calvarial/clivus heterogeneity is concerning for metastases. MRI head with and without contrast is recommended for further evaluation. Electronically Signed   By: Primitivo Gauze M.D.   On: 11/29/2019 16:37   CT SOFT TISSUE NECK W CONTRAST  Result Date: 12/01/2019 CLINICAL DATA:  Epiglottitis or tonsillitis suspected.  Neck pain. EXAM: CT NECK WITH CONTRAST TECHNIQUE: Multidetector CT imaging of the neck was performed using the standard protocol following the bolus administration of intravenous contrast. CONTRAST:  58mL OMNIPAQUE IOHEXOL 350 MG/ML SOLN COMPARISON:  11/29/2019 CT and MRI head.  Concurrent CTA chest. FINDINGS: Pharynx and larynx: Clear nasopharynx. Normal appearance of the epiglottis. Partial effacement of the left vallecula and piriform sinus, likely  secondary to neck positioning. The vallecula and piriform sinuses are otherwise unremarkable. No vocal cord paralysis. Small retropharyngeal effusion spanning the C2-C5 levels. Salivary glands: No inflammation, mass, or stone. Thyroid: Normal. Lymph nodes: Diffusely prominent subcentimeter cervical nodes. Vascular: Normal intravascular enhancement. Limited intracranial: Please see recent CT and MRI head. Visualized orbits: Normal orbits. Mastoids and visualized paranasal sinuses: Pneumatized. Skeleton: Diffuse heterogeneity of the bone marrow predominantly involving the cervical spine at the C1-2 and  C6-T1 levels. There is multilevel involvement of the posterior elements. Vertebral body heights are preserved. Perivertebral inflammatory changes most prominent at the C2 level. Please see recent CT and MRI head for better evaluation of dominant clival and multifocal calvarial osseous metastases. Redemonstration of anterior left TMJ metastasis. Upper chest: Partially imaged pulmonary metastases. Please see concurrent CTA chest for additional findings below the thoracic inlet. Other: None. IMPRESSION: No evidence of epiglottitis or tonsillitis. Diffuse osseous metastases with prominent involvement of the cervical spine. Vertebral body heights are preserved however cannot exclude pending pathologic fracture. Perivertebral inflammatory changes most prominent at the atlantoaxial junction with small retropharyngeal effusion. Cannot exclude metastatic soft tissue involvement. MRI cervical spine is recommended for better evaluation. Partially imaged pulmonary metastases. Please see concurrent CTA chest for additional findings below the thoracic inlet. These results will be called to the ordering clinician or representative by the Radiologist Assistant, and communication documented in the PACS or [employer]. Electronically Signed   By: Primitivo Gauze M.D.   On: 12/01/2019 19:30   CT Chest W Contrast  Result Date:  11/13/2019 CLINICAL DATA:  Breast cancer, recent weight loss and decreased appetite. Liver lesions on recent CT abdomen pelvis. EXAM: CT CHEST WITH CONTRAST TECHNIQUE: Multidetector CT imaging of the chest was performed during intravenous contrast administration. CONTRAST:  28mL OMNIPAQUE IOHEXOL 300 MG/ML  SOLN COMPARISON:  CT abdomen pelvis 11/02/2019, MR lumbar spine 10/24/2019. FINDINGS: Cardiovascular: Extensive mural thickening and severe luminal narrowing involving the left subclavian artery. Heart size normal. No pericardial effusion. Mediastinum/Nodes: Mildly hypodense left thyroid nodule measures 1.3 cm. No follow-up recommended (ref: J Am Coll Radiol. 2015 Feb;12(2): 143-50).No pathologically enlarged mediastinal lymph nodes. Left hilar lymph node measures 1.4 x 1.6 cm. Right axillary lymph nodes measure up to 8 mm. Surgical clips in the left axilla. Soft tissue mass in the epicardial fat along the cardiac apex measures 2.0 x 2.7 cm (2/102). Esophagus is grossly unremarkable. Lungs/Pleura: Diffuse perilymphatic nodularity bilaterally. Extensive pleural/subpleural nodularity in the left hemithorax. Index subpleural nodule in the anterior left upper lobe measures 1.1 x 1.9 cm (4/50). No pleural fluid. Airway is unremarkable. Upper Abdomen: Multiple heterogeneous thick-walled lesions throughout the liver, seen in their entirety on 11/02/2019. Index lesion in the dome of the liver measures 1.9 x 2.1 cm (2/107). Visualized portion of the gallbladder is unremarkable. Slight nodular thickening of both adrenal glands. Subcentimeter low-attenuation lesion in the left kidney is likely a cyst. Visualized portions of the kidneys, spleen, pancreas, stomach and bowel are grossly unremarkable. Periportal lymph nodes measure up to 11 mm. Gastrohepatic ligament lymph nodes, 5 mm. Musculoskeletal: Mottled sclerotic metastases are seen throughout the visualized osseous structures. Healing or healed pathologic right rib  fractures. Pathologic superior endplate compression fracture involving T12. Nodular soft tissue in the lateral left breast measures 1.1 x 1.7 cm (2/60). IMPRESSION: 1. Widespread pleuroparenchymal, left hilar, hepatic and osseous metastatic disease. Lateral left breast mass may be postoperative in etiology. Disease recurrence cannot be excluded. 2. Extensive mural thickening and severe luminal narrowing involving the left subclavian artery. Electronically Signed   By: Lorin Picket M.D.   On: 11/13/2019 09:54   CT ANGIO CHEST PE W OR WO CONTRAST  Result Date: 12/01/2019 CLINICAL DATA:  PE suspected, low/intermediate prob, positive D-dimer History of metastatic breast cancer. EXAM: CT ANGIOGRAPHY CHEST WITH CONTRAST TECHNIQUE: Multidetector CT imaging of the chest was performed using the standard protocol during bolus administration of intravenous contrast. Multiplanar CT image reconstructions and MIPs were obtained  to evaluate the vascular anatomy. CONTRAST:  56mL OMNIPAQUE IOHEXOL 350 MG/ML SOLN COMPARISON:  Chest CT 11/12/2019, thoracic spine MRI 11/18/2019 FINDINGS: Cardiovascular: There are no filling defects within the pulmonary arteries to suggest pulmonary embolus. Left hilar lymph node is enlarging in causing increasing mass effect on lingular pulmonary artery but no evidence of invasion. Common origin of brachiocephalic and left common carotid artery, variant arch anatomy. Extensive mural thickening with severe luminal narrowing involving the proximal left subclavian artery is unchanged from recent exam. There is no aortic dissection or acute aortic findings. The heart is normal in size. No pericardial effusion. Mediastinum/Nodes: Left hilar lymph node has slightly increased in size currently measuring 18 x 15 mm, previously 16 x 14 mm. Soft tissue mass in the epicardial fat along the cardiac apex measures 3.3 x 2.1 cm (series 5, image 68), previously 2.7 x 2.0 cm. No esophageal wall thickening.  Previous left thyroid nodule is not well-defined on the current exam due to streak artifact in the right subclavian vein from dense IV contrast. Surgical clips in the left axilla. Previous small right axillary nodes are partially obscured by dense IV contrast. Lungs/Pleura: Extensive bilateral pulmonary nodules are again seen, both random and pleural/subpleural. Majority of these are stable from recent prior, however largest nodule currently measures 2.2 x 1.2 cm, series 7, image 51, previously 1.9 x 1.1 cm. A small right pleural effusion is new, with adjacent compressive atelectasis. No septal thickening or findings of pulmonary edema. Upper Abdomen: Known liver lesions on prior exam are less well-defined given phase of IV contrast. Musculoskeletal: Diffuse osseous metastatic disease throughout the thoracic osseous structures lateral left breast soft tissue nodularity measures 15 x 12 mm, series 5, image 49. With lytic and blastic osseous lesions. Callus formation about right anterior ribs fractures, also seen on previous. Review of the MIP images confirms the above findings. IMPRESSION: 1. No pulmonary embolus. 2. New small right pleural effusion with adjacent compressive atelectasis. 3. Extensive bilateral pulmonary nodules consistent with metastatic disease. Majority of these are stable from recent prior, however largest pulmonary nodule has increased in size from prior. 4. Left hilar lymph node has slightly increased in size from recent prior. Soft tissue mass in the epicardial fat along the left cardiac apex has increased in size. 5. Diffuse osseous metastatic disease. 6. Left breast soft tissue nodularity laterally again seen. 7. Known liver lesions on prior exam are less well-defined on the current exam given phase of IV contrast. 8. Extensive mural thickening with severe luminal narrowing involving the proximal left subclavian artery is unchanged from recent prior. Electronically Signed   By: Keith Rake M.D.   On: 12/01/2019 19:20   MR Brain W and Wo Contrast  Result Date: 11/29/2019 CLINICAL DATA:  Dizziness, nonspecific.  Brain mass or lesion. EXAM: MRI HEAD WITHOUT AND WITH CONTRAST TECHNIQUE: Multiplanar, multiecho pulse sequences of the brain and surrounding structures were obtained without and with intravenous contrast. CONTRAST:  26mL GADAVIST GADOBUTROL 1 MMOL/ML IV SOLN COMPARISON:  11/29/2019 head CT. FINDINGS: Brain: No acute infarct. No acute intracranial hemorrhage. No midline shift, ventriculomegaly or extra-axial fluid collection. Diffuse dural thickening overlying the right cerebral convexity. Focal dural thickening overlying the left parietal lobe (18:117). 1.5 x 0.5 cm right dural-based lesion overlying the temporal convexity (18:67). There is also dural thickening along the clivus. Vascular: Preserved major intracranial flow voids. Skull and upper cervical spine: Multifocal T2 hyperintense enhancing calvarial lesions reflect osseous metastases. Enhancing 1.8 cm right frontal  calvarial soft tissue (15:35) with dehiscence of the overlying cortex. 2.6 x 1.8 cm clival metastasis (15:10). Smaller enhancing osseous lesions are also seen involving the bilateral sphenoid wings and occipital condyles. Sinuses/Orbits: Normal orbits clear paranasal sinuses. Trace right mastoid effusion. Other: 1.4 x 0.8 cm enhancing soft tissue involving the left TMJ (18:40). Enhancing soft tissue infiltration involving the right temporalis muscle which is asymmetrically thickened (18:94). IMPRESSION: Dural-based metastatic disease demonstrating right predominance. Focal plaque-like dural lesions measuring up to 1.5 x 0.5 cm overlie the right temporal and left parietal convexities. Multifocal osseous metastases with dominant lesions involving the clivus, right frontal calvarium and left TMJ. Metastatic involvement of the right temporalis muscle. Electronically Signed   By: Primitivo Gauze M.D.   On:  11/29/2019 19:25   MR CERVICAL SPINE W WO CONTRAST  Result Date: 12/02/2019 CLINICAL DATA:  Metastatic breast cancer with possible pathologic fracture in the cervical spine. EXAM: MRI CERVICAL SPINE WITHOUT AND WITH CONTRAST TECHNIQUE: Multiplanar and multiecho pulse sequences of the cervical spine, to include the craniocervical junction and cervicothoracic junction, were obtained without and with intravenous contrast. CONTRAST:  53mL GADAVIST GADOBUTROL 1 MMOL/ML IV SOLN COMPARISON:  Neck CT 12/01/2019. Brain MRI 11/29/2019. Thoracic spine MRI 11/18/2019. FINDINGS: Alignment: Minimal retrolisthesis of C5 on C6 and C6 on C7. Vertebrae: Diffusely abnormal bone marrow signal throughout the cervical spine, included upper thoracic spine, and skull base consistent with known widespread osseous metastatic disease. Thin curvilinear region of low signal through the base of the dens suggestive of a nondisplaced fracture with possible extension more laterally in the C2 vertebral body. Extra osseous tumor extension about the C2 anterior and posterior elements. Cord: Normal signal. Posterior Fossa, vertebral arteries, paraspinal tissues: Preserved vertebral artery flow voids. Small prevertebral effusion. Previously described clival metastasis with overlying dural thickening extending into the upper cervical spine. No masslike cervical epidural tumor. Disc levels: Cervical disc degeneration greatest at C5-6 where a broad-based posterior disc osteophyte complex and infolding of the ligamentum flavum result in mild-to-moderate spinal stenosis and mild-to-moderate right and severe left neural foraminal stenosis. Disc bulging and mild spurring at C6-7 resulting in moderate right and mild left neural foraminal stenosis without significant spinal stenosis. IMPRESSION: 1. Widespread osseous metastatic disease. 2. Suspected nondisplaced pathologic fracture involving the C2 vertebral body and base of dens. 3. Ventral dural thickening  in the upper cervical spine contiguous with previously described intracranial dural thickening. No masslike cervical dural thickening/epidural tumor. 4. Cervical disc degeneration, worst at C5-6 where there is mild-to-moderate spinal stenosis and severe left neural foraminal stenosis. These results will be called to the ordering clinician or representative by the Radiologist Assistant, and communication documented in the PACS or [employer]. Electronically Signed   By: Logan Bores M.D.   On: 12/02/2019 17:01   MR Thoracic Spine W Wo Contrast  Result Date: 11/18/2019 CLINICAL DATA:  Breast cancer EXAM: MRI THORACIC WITHOUT AND WITH CONTRAST TECHNIQUE: Multiplanar and multiecho pulse sequences of the thoracic spine were obtained without and with intravenous contrast. CONTRAST:  16mL GADAVIST GADOBUTROL 1 MMOL/ML IV SOLN COMPARISON:  10/24/2019 lumbar spine MRI FINDINGS: Cervical Localizer sequence is motion degraded. Therefore counting may not be accurate. Alignment: Trace retrolisthesis at T10-T11. Vertebrae: Heterogeneous STIR hyperintensity and enhancement throughout the thoracic spine. Mild compression deformity of T12 is unchanged since prior lumbar MRI. There is additional endplate irregularity and mild loss of height at T9 and T10. There is no significant epidural extension. Cord:  No abnormal signal.  No  abnormal intrathecal enhancement. Paraspinal and other soft tissues: Extra-spinal metastatic disease is partially imaged and present on prior cross-sectional imaging. Disc levels: Mild multilevel degenerative disc disease and facet arthropathy. There is no high-grade degenerative stenosis. IMPRESSION: Diffuse osseous metastatic disease. No significant epidural extension. No definite acute compression deformity. Electronically Signed   By: Macy Mis M.D.   On: 11/18/2019 14:55   DG Chest Port 1 View  Result Date: 11/29/2019 CLINICAL DATA:  Possible sepsis, metastatic breast cancer EXAM:  PORTABLE CHEST 1 VIEW COMPARISON:  05/07/2019 FINDINGS: Increased interstitial prominence with patchy density. No pleural effusion or pneumothorax. Cardiomediastinal contours are within normal limits with normal heart size. IMPRESSION: Interstitial prominence and patchy density, at least a component of which likely reflects metastatic disease seen on recent chest CT. There may be superimposed edema or atypical infection. Electronically Signed   By: Macy Mis M.D.   On: 11/29/2019 14:45   ECHOCARDIOGRAM COMPLETE  Result Date: 12/02/2019    ECHOCARDIOGRAM REPORT   Patient Name:   YASLYN CUMBY Date of Exam: 12/02/2019 Medical Rec #:  [id number]        Height:       63.0 in Accession #:    [id number]       Weight:       114.2 lb Date of Birth:  January 08, 1961       BSA:          1.524 m Patient Age:    25 years         BP:           131/68 mmHg Patient Gender: F                HR:           99 bpm. Exam Location:  [facility] Procedure: 2D Echo, Cardiac Doppler and Color Doppler Indications:    R94.31 Abnormal EKG; R00.0 Tachycardia  History:        Patient has no prior history of Echocardiogram examinations.                 Abnormal ECG. Breast cancer. Chemotherapy.  Sonographer:    Roseanna Rainbow RDCS (AE) Referring Phys: [phone] [facility]  Sonographer Comments: Technically difficult study due to poor echo windows. Rib artifact. Attempted to turn. Images off-axis. IMPRESSIONS  1. Left ventricular ejection fraction, by estimation, is 60 to 65%. The left ventricle has normal function. The left ventricle has no regional wall motion abnormalities. Left ventricular diastolic parameters are consistent with Grade I diastolic dysfunction (impaired relaxation).  2. Right ventricular systolic function is normal. The right ventricular size is normal.  3. The mitral valve is normal in structure. No evidence of mitral valve regurgitation. No evidence of mitral stenosis.  4. The aortic valve is tricuspid. Aortic valve  regurgitation is not visualized. No aortic stenosis is present.  5. The inferior vena cava is normal in size with greater than 50% respiratory variability, suggesting right atrial pressure of 3 mmHg. FINDINGS  Left Ventricle: Left ventricular ejection fraction, by estimation, is 60 to 65%. The left ventricle has normal function. The left ventricle has no regional wall motion abnormalities. The left ventricular internal cavity size was normal in size. There is  no left ventricular hypertrophy. Left ventricular diastolic parameters are consistent with Grade I diastolic dysfunction (impaired relaxation). Normal left ventricular filling pressure. Right Ventricle: The right ventricular size is normal. No increase in right ventricular wall thickness. Right ventricular systolic function is  normal. Left Atrium: Left atrial size was normal in size. Right Atrium: Right atrial size was normal in size. Pericardium: There is no evidence of pericardial effusion. Mitral Valve: The mitral valve is normal in structure. No evidence of mitral valve regurgitation. No evidence of mitral valve stenosis. Tricuspid Valve: The tricuspid valve is normal in structure. Tricuspid valve regurgitation is not demonstrated. No evidence of tricuspid stenosis. Aortic Valve: The aortic valve is tricuspid. Aortic valve regurgitation is not visualized. No aortic stenosis is present. Aortic valve mean gradient measures 3.1 mmHg. Aortic valve peak gradient measures 6.4 mmHg. Aortic valve area, by VTI measures 1.83 cm. Pulmonic Valve: The pulmonic valve was not well visualized. Pulmonic valve regurgitation is not visualized. No evidence of pulmonic stenosis. Aorta: The aortic root is normal in size and structure. Pulmonary Artery: Indeterminant PASP, inadequate TR jet. Venous: The inferior vena cava is normal in size with greater than 50% respiratory variability, suggesting right atrial pressure of 3 mmHg. IAS/Shunts: No atrial level shunt detected by  color flow Doppler.  LEFT VENTRICLE PLAX 2D LVIDd:         3.36 cm     Diastology LVIDs:         2.16 cm     LV e' medial:   6.96 cm/s LV PW:         1.03 cm     LV E/e' medial: 12.4 LV IVS:        0.94 cm LVOT diam:     1.70 cm LV SV:         38 LV SV Index:   25 LVOT Area:     2.27 cm  LV Volumes (MOD) LV vol d, MOD A2C: 55.0 ml LV vol d, MOD A4C: 32.5 ml LV vol s, MOD A2C: 18.4 ml LV vol s, MOD A4C: 11.3 ml LV SV MOD A2C:     36.6 ml LV SV MOD A4C:     32.5 ml LV SV MOD BP:      32.0 ml RIGHT VENTRICLE RV S prime:     11.30 cm/s TAPSE (M-mode): 1.5 cm LEFT ATRIUM           Index      RIGHT ATRIUM          Index LA diam:      2.20 cm 1.44 cm/m RA Area:     7.17 cm LA Vol (A4C): 14.0 ml 9.19 ml/m RA Volume:   12.20 ml 8.01 ml/m  AORTIC VALVE AV Area (Vmax):    1.81 cm AV Area (Vmean):   1.59 cm AV Area (VTI):     1.83 cm AV Vmax:           126.43 cm/s AV Vmean:          81.029 cm/s AV VTI:            0.208 m AV Peak Grad:      6.4 mmHg AV Mean Grad:      3.1 mmHg LVOT Vmax:         101.00 cm/s LVOT Vmean:        56.600 cm/s LVOT VTI:          0.168 m LVOT/AV VTI ratio: 0.81  AORTA Ao Root diam: 2.80 cm Ao Asc diam:  3.20 cm MITRAL VALVE MV Area (PHT): 4.21 cm    SHUNTS MV Decel Time: 180 msec    Systemic VTI:  0.17 m MV E velocity: 86.50 cm/s  Systemic  Diam: 1.70 cm MV A velocity: 90.80 cm/s MV E/A ratio:  0.95 Carlyle Dolly MD Electronically signed by Carlyle Dolly MD Signature Date/Time: 12/02/2019/3:28:22 PM    Final    [place] CORE BIOPSY (LIVER)  Result Date: 11/19/2019 INDICATION: History of breast carcinoma with new multiple liver lesions, lung nodules and bone lesions consistent with recurrent metastatic disease. Biopsy of a liver lesion has been requested to confirm metastatic disease and perform molecular prognostic studies. EXAM: ULTRASOUND GUIDED CORE BIOPSY OF LIVER MEDICATIONS: None. ANESTHESIA/SEDATION: Fentanyl 100 mcg IV; Versed 2.0 mg IV Moderate Sedation Time:  16 minutes. The patient  was continuously monitored during the procedure by the interventional radiology nurse under my direct supervision. PROCEDURE: The procedure, risks, benefits, and alternatives were explained to the patient. Questions regarding the procedure were encouraged and answered. The patient understands and consents to the procedure. A time-out was performed prior to initiating the procedure. The abdominal wall was prepped with chlorhexidine in a sterile fashion, and a sterile drape was applied covering the operative field. A sterile gown and sterile gloves were used for the procedure. Local anesthesia was provided with 1% Lidocaine. Ultrasound was used to localize liver lesions. After choosing a lesion in the right lobe of the liver, a 17 gauge needle was advanced into the liver to the margin of the lesion. Three separate coaxial 18 gauge core biopsy samples were obtained through the lesion. Core biopsy samples were submitted in formalin. Gel-Foam pledgets were advanced through the outer needle as the needle was retracted and removed. Additional ultrasound was performed. COMPLICATIONS: None immediate. FINDINGS: There are multiple hypoechoic solid round and oval masses scattered throughout the liver parenchyma. The largest localized by ultrasound measures approximately 2 cm in diameter and lies within the anterior aspect of the right lobe near the gallbladder fossa. This lesion was sampled yielding solid tissue. IMPRESSION: Ultrasound-guided core biopsy performed of a 2 cm lesion within the right lobe of the liver. Electronically Signed   By: Aletta Edouard M.D.   On: 11/19/2019 15:42    Orson Eva, DO  [facility]  If 7PM-7AM, please contact night-coverage [email or web address] Password [email or web address] 12/06/2019, 9:51 AM   LOS: 7 days

## 2019-12-08 ENCOUNTER — Other Ambulatory Visit: Payer: Self-pay

## 2019-12-08 ENCOUNTER — Emergency Department (HOSPITAL_COMMUNITY): Payer: BC Managed Care – PPO

## 2019-12-08 ENCOUNTER — Inpatient Hospital Stay (HOSPITAL_COMMUNITY)
Admission: EM | Admit: 2019-12-08 | Discharge: 2019-12-12 | DRG: 917 | Disposition: A | Discharge status: Skilled Nursing Facility | Payer: BC Managed Care – PPO | Source: Skilled Nursing Facility | Attending: Internal Medicine | Admitting: Internal Medicine

## 2019-12-08 ENCOUNTER — Encounter (HOSPITAL_COMMUNITY): Payer: Self-pay

## 2019-12-08 DIAGNOSIS — R339 Retention of urine, unspecified: Secondary | ICD-10-CM | POA: Diagnosis present

## 2019-12-08 DIAGNOSIS — C7951 Secondary malignant neoplasm of bone: Secondary | ICD-10-CM | POA: Diagnosis present

## 2019-12-08 DIAGNOSIS — C7989 Secondary malignant neoplasm of other specified sites: Secondary | ICD-10-CM | POA: Diagnosis not present

## 2019-12-08 DIAGNOSIS — G9340 Encephalopathy, unspecified: Secondary | ICD-10-CM | POA: Diagnosis not present

## 2019-12-08 DIAGNOSIS — Z20822 Contact with and (suspected) exposure to covid-19: Secondary | ICD-10-CM | POA: Diagnosis not present

## 2019-12-08 DIAGNOSIS — E119 Type 2 diabetes mellitus without complications: Secondary | ICD-10-CM | POA: Diagnosis present

## 2019-12-08 DIAGNOSIS — E860 Dehydration: Secondary | ICD-10-CM | POA: Diagnosis present

## 2019-12-08 DIAGNOSIS — I10 Essential (primary) hypertension: Secondary | ICD-10-CM | POA: Diagnosis present

## 2019-12-08 DIAGNOSIS — C7801 Secondary malignant neoplasm of right lung: Secondary | ICD-10-CM | POA: Diagnosis not present

## 2019-12-08 DIAGNOSIS — G893 Neoplasm related pain (acute) (chronic): Secondary | ICD-10-CM | POA: Diagnosis not present

## 2019-12-08 DIAGNOSIS — Z7951 Long term (current) use of inhaled steroids: Secondary | ICD-10-CM

## 2019-12-08 DIAGNOSIS — C50919 Malignant neoplasm of unspecified site of unspecified female breast: Secondary | ICD-10-CM | POA: Diagnosis not present

## 2019-12-08 DIAGNOSIS — M8448XA Pathological fracture, other site, initial encounter for fracture: Secondary | ICD-10-CM | POA: Diagnosis not present

## 2019-12-08 DIAGNOSIS — K5903 Drug induced constipation: Secondary | ICD-10-CM | POA: Diagnosis present

## 2019-12-08 DIAGNOSIS — Z7189 Other specified counseling: Secondary | ICD-10-CM | POA: Diagnosis not present

## 2019-12-08 DIAGNOSIS — E7800 Pure hypercholesterolemia, unspecified: Secondary | ICD-10-CM | POA: Diagnosis present

## 2019-12-08 DIAGNOSIS — C7802 Secondary malignant neoplasm of left lung: Secondary | ICD-10-CM | POA: Diagnosis present

## 2019-12-08 DIAGNOSIS — T40601A Poisoning by unspecified narcotics, accidental (unintentional), initial encounter: Principal | ICD-10-CM | POA: Diagnosis present

## 2019-12-08 DIAGNOSIS — R918 Other nonspecific abnormal finding of lung field: Secondary | ICD-10-CM | POA: Diagnosis not present

## 2019-12-08 DIAGNOSIS — J3081 Allergic rhinitis due to animal (cat) (dog) hair and dander: Secondary | ICD-10-CM | POA: Diagnosis present

## 2019-12-08 DIAGNOSIS — G620 Drug-induced polyneuropathy: Secondary | ICD-10-CM | POA: Diagnosis present

## 2019-12-08 DIAGNOSIS — T451X5A Adverse effect of antineoplastic and immunosuppressive drugs, initial encounter: Secondary | ICD-10-CM | POA: Diagnosis not present

## 2019-12-08 DIAGNOSIS — Z794 Long term (current) use of insulin: Secondary | ICD-10-CM

## 2019-12-08 DIAGNOSIS — E43 Unspecified severe protein-calorie malnutrition: Secondary | ICD-10-CM | POA: Diagnosis not present

## 2019-12-08 DIAGNOSIS — N179 Acute kidney failure, unspecified: Secondary | ICD-10-CM | POA: Diagnosis present

## 2019-12-08 DIAGNOSIS — R55 Syncope and collapse: Secondary | ICD-10-CM | POA: Diagnosis not present

## 2019-12-08 DIAGNOSIS — R069 Unspecified abnormalities of breathing: Secondary | ICD-10-CM | POA: Diagnosis not present

## 2019-12-08 DIAGNOSIS — C787 Secondary malignant neoplasm of liver and intrahepatic bile duct: Secondary | ICD-10-CM | POA: Diagnosis present

## 2019-12-08 DIAGNOSIS — T402X5A Adverse effect of other opioids, initial encounter: Secondary | ICD-10-CM | POA: Diagnosis present

## 2019-12-08 DIAGNOSIS — R000 Tachycardia, unspecified: Secondary | ICD-10-CM | POA: Diagnosis not present

## 2019-12-08 DIAGNOSIS — Z515 Encounter for palliative care: Secondary | ICD-10-CM | POA: Diagnosis not present

## 2019-12-08 DIAGNOSIS — G9341 Metabolic encephalopathy: Secondary | ICD-10-CM | POA: Diagnosis not present

## 2019-12-08 DIAGNOSIS — R0902 Hypoxemia: Secondary | ICD-10-CM | POA: Diagnosis not present

## 2019-12-08 DIAGNOSIS — Z6820 Body mass index (BMI) 20.0-20.9, adult: Secondary | ICD-10-CM

## 2019-12-08 DIAGNOSIS — Z923 Personal history of irradiation: Secondary | ICD-10-CM

## 2019-12-08 DIAGNOSIS — R4182 Altered mental status, unspecified: Secondary | ICD-10-CM | POA: Diagnosis not present

## 2019-12-08 DIAGNOSIS — Z9221 Personal history of antineoplastic chemotherapy: Secondary | ICD-10-CM

## 2019-12-08 DIAGNOSIS — Z79899 Other long term (current) drug therapy: Secondary | ICD-10-CM

## 2019-12-08 DIAGNOSIS — E876 Hypokalemia: Secondary | ICD-10-CM | POA: Diagnosis not present

## 2019-12-08 DIAGNOSIS — R0681 Apnea, not elsewhere classified: Secondary | ICD-10-CM | POA: Diagnosis present

## 2019-12-08 DIAGNOSIS — D630 Anemia in neoplastic disease: Secondary | ICD-10-CM | POA: Diagnosis present

## 2019-12-08 LAB — CBC WITH DIFFERENTIAL/PLATELET
Abs Immature Granulocytes: 0.15 10*3/uL — ABNORMAL HIGH (ref 0.00–0.07)
Basophils Absolute: 0.1 10*3/uL (ref 0.0–0.1)
Basophils Relative: 1 %
Eosinophils Absolute: 0.1 10*3/uL (ref 0.0–0.5)
Eosinophils Relative: 1 %
HCT: 27.5 % — ABNORMAL LOW (ref 36.0–46.0)
Hemoglobin: 8.4 g/dL — ABNORMAL LOW (ref 12.0–15.0)
Immature Granulocytes: 2 %
Lymphocytes Relative: 24 %
Lymphs Abs: 2.3 10*3/uL (ref 0.7–4.0)
MCH: 25.9 pg — ABNORMAL LOW (ref 26.0–34.0)
MCHC: 30.5 g/dL (ref 30.0–36.0)
MCV: 84.9 fL (ref 80.0–100.0)
Monocytes Absolute: 1.1 10*3/uL — ABNORMAL HIGH (ref 0.1–1.0)
Monocytes Relative: 11 %
Neutro Abs: 6.2 10*3/uL (ref 1.7–7.7)
Neutrophils Relative %: 61 %
Platelets: 304 10*3/uL (ref 150–400)
RBC: 3.24 MIL/uL — ABNORMAL LOW (ref 3.87–5.11)
RDW: 20.4 % — ABNORMAL HIGH (ref 11.5–15.5)
WBC: 9.9 10*3/uL (ref 4.0–10.5)
nRBC: 3.2 % — ABNORMAL HIGH (ref 0.0–0.2)

## 2019-12-08 LAB — COMPREHENSIVE METABOLIC PANEL
ALT: 39 U/L (ref 0–44)
AST: 73 U/L — ABNORMAL HIGH (ref 15–41)
Albumin: 2.8 g/dL — ABNORMAL LOW (ref 3.5–5.0)
Alkaline Phosphatase: 206 U/L — ABNORMAL HIGH (ref 38–126)
Anion gap: 11 (ref 5–15)
BUN: 23 mg/dL (ref 8–23)
CO2: 19 mmol/L — ABNORMAL LOW (ref 22–32)
Calcium: 9.4 mg/dL (ref 8.9–10.3)
Chloride: 107 mmol/L (ref 98–111)
Creatinine, Ser: 1.25 mg/dL — ABNORMAL HIGH (ref 0.44–1.00)
GFR, Estimated: 49 mL/min — ABNORMAL LOW (ref >60)
Glucose, Bld: 120 mg/dL — ABNORMAL HIGH (ref 70–99)
Potassium: 4.1 mmol/L (ref 3.5–5.1)
Sodium: 137 mmol/L (ref 135–145)
Total Bilirubin: 0.8 mg/dL (ref 0.3–1.2)
Total Protein: 8.2 g/dL — ABNORMAL HIGH (ref 6.5–8.1)

## 2019-12-08 LAB — RESPIRATORY PANEL BY RT PCR (FLU A&B, COVID)
Influenza A by PCR: NEGATIVE
Influenza B by PCR: NEGATIVE
SARS Coronavirus 2 by RT PCR: NEGATIVE

## 2019-12-08 LAB — TROPONIN I (HIGH SENSITIVITY)
Troponin I (High Sensitivity): 60 ng/L — ABNORMAL HIGH (ref <18)
Troponin I (High Sensitivity): 67 ng/L — ABNORMAL HIGH (ref <18)

## 2019-12-08 LAB — CBG MONITORING, ED
Glucose-Capillary: 132 mg/dL — ABNORMAL HIGH (ref 70–99)
Glucose-Capillary: 117 mg/dL — ABNORMAL HIGH (ref 70–99)

## 2019-12-08 LAB — PTH-RELATED PEPTIDE: PTH-related peptide: <2 pmol/L

## 2019-12-08 LAB — GLUCOSE, CAPILLARY: Glucose-Capillary: 134 mg/dL — ABNORMAL HIGH (ref 70–99)

## 2019-12-08 MED ORDER — ACETAMINOPHEN 650 MG RE SUPP: 650.0000 mg | Freq: Four times a day (QID) | RECTAL | Status: DC | PRN

## 2019-12-08 MED ORDER — METOPROLOL TARTRATE 25 MG PO TABS
37.5000 mg | ORAL_TABLET | Freq: Two times a day (BID) | ORAL | Status: DC
  Administered 2019-12-08: 37.5 mg via ORAL
  Administered 2019-12-09: 50 mg via ORAL
  Filled 2019-12-08 (×2): qty 2

## 2019-12-08 MED ORDER — LORAZEPAM 2 MG/ML IJ SOLN: 0.5000 mg | Freq: Four times a day (QID) | INTRAMUSCULAR | Status: DC | PRN

## 2019-12-08 MED ORDER — LACTULOSE 10 GM/15ML PO SOLN
20.0000 g | Freq: Every day | ORAL | Status: DC
  Administered 2019-12-09 – 2019-12-11 (×3): 20 g via ORAL
  Filled 2019-12-08 (×3): qty 30

## 2019-12-08 MED ORDER — FENTANYL CITRATE (PF) 100 MCG/2ML IJ SOLN
50.0000 ug | Freq: Once | INTRAMUSCULAR | Status: AC
  Administered 2019-12-08: 50 ug via INTRAVENOUS

## 2019-12-08 MED ORDER — ATORVASTATIN CALCIUM 20 MG PO TABS
20.0000 mg | ORAL_TABLET | Freq: Every day | ORAL | Status: DC
  Administered 2019-12-08 – 2019-12-11 (×4): 20 mg via ORAL
  Filled 2019-12-08 (×4): qty 1

## 2019-12-08 MED ORDER — METOPROLOL TARTRATE 25 MG PO TABS: 25.0000 mg | ORAL_TABLET | Freq: Two times a day (BID) | ORAL | Status: DC

## 2019-12-08 MED ORDER — FENTANYL 50 MCG/HR TD PT72
1.0000 | MEDICATED_PATCH | TRANSDERMAL | Status: DC
  Administered 2019-12-08 – 2019-12-11 (×2): 1 via TRANSDERMAL
  Filled 2019-12-08 (×2): qty 1

## 2019-12-08 MED ORDER — ACETAMINOPHEN 325 MG PO TABS: 650.0000 mg | ORAL_TABLET | Freq: Four times a day (QID) | ORAL | Status: DC | PRN

## 2019-12-08 MED ORDER — TRAZODONE HCL 50 MG PO TABS
50.0000 mg | ORAL_TABLET | Freq: Every evening | ORAL | Status: DC | PRN
  Administered 2019-12-10: 50 mg via ORAL
  Filled 2019-12-08: qty 1

## 2019-12-08 MED ORDER — ONDANSETRON HCL 4 MG/2ML IJ SOLN: 4.0000 mg | Freq: Four times a day (QID) | INTRAMUSCULAR | Status: DC | PRN

## 2019-12-08 MED ORDER — SODIUM CHLORIDE 0.9% FLUSH
3.0000 mL | Freq: Two times a day (BID) | INTRAVENOUS | Status: DC
  Administered 2019-12-09 – 2019-12-10 (×3): 3 mL via INTRAVENOUS

## 2019-12-08 MED ORDER — MOMETASONE FURO-FORMOTEROL FUM 200-5 MCG/ACT IN AERO
2.0000 | INHALATION_SPRAY | Freq: Two times a day (BID) | RESPIRATORY_TRACT | Status: DC
  Administered 2019-12-08 – 2019-12-10 (×4): 2 via RESPIRATORY_TRACT
  Filled 2019-12-08: qty 8.8

## 2019-12-08 MED ORDER — SODIUM CHLORIDE 0.9% FLUSH: 3.0000 mL | INTRAVENOUS | Status: DC | PRN

## 2019-12-08 MED ORDER — DEXTROSE-NACL 5-0.45 % IV SOLN: INTRAVENOUS | Status: DC

## 2019-12-08 MED ORDER — HEPARIN SODIUM (PORCINE) 5000 UNIT/ML IJ SOLN
5000.0000 [IU] | Freq: Three times a day (TID) | INTRAMUSCULAR | Status: DC
  Administered 2019-12-08 – 2019-12-12 (×13): 5000 [IU] via SUBCUTANEOUS
  Filled 2019-12-08 (×13): qty 1

## 2019-12-08 MED ORDER — SENNOSIDES-DOCUSATE SODIUM 8.6-50 MG PO TABS
2.0000 | ORAL_TABLET | Freq: Two times a day (BID) | ORAL | Status: DC
  Administered 2019-12-08 – 2019-12-11 (×6): 2 via ORAL
  Filled 2019-12-08 (×7): qty 2

## 2019-12-08 MED ORDER — ONDANSETRON HCL 4 MG PO TABS: 4.0000 mg | ORAL_TABLET | Freq: Four times a day (QID) | ORAL | Status: DC | PRN

## 2019-12-08 MED ORDER — INSULIN ASPART 100 UNIT/ML ~~LOC~~ SOLN: 0.0000 [IU] | Freq: Every day | SUBCUTANEOUS | Status: DC

## 2019-12-08 MED ORDER — METOPROLOL TARTRATE 5 MG/5ML IV SOLN
5.0000 mg | Freq: Once | INTRAVENOUS | Status: AC
  Administered 2019-12-08: 5 mg via INTRAVENOUS
  Filled 2019-12-08: qty 5

## 2019-12-08 MED ORDER — BISACODYL 10 MG RE SUPP: 10.0000 mg | Freq: Every day | RECTAL | Status: DC | PRN

## 2019-12-08 MED ORDER — INSULIN ASPART 100 UNIT/ML ~~LOC~~ SOLN
0.0000 [IU] | Freq: Three times a day (TID) | SUBCUTANEOUS | Status: DC
  Administered 2019-12-09 (×2): 1 [IU] via SUBCUTANEOUS

## 2019-12-08 MED ORDER — FENTANYL CITRATE (PF) 100 MCG/2ML IJ SOLN
INTRAMUSCULAR | Status: AC
  Filled 2019-12-08: qty 2

## 2019-12-08 MED ORDER — FENTANYL 100 MCG/HR TD PT72: 1.0000 | MEDICATED_PATCH | TRANSDERMAL | Status: DC

## 2019-12-08 MED ORDER — NALOXONE HCL 0.4 MG/ML IJ SOLN: 0.4000 mg | INTRAMUSCULAR | Status: DC | PRN

## 2019-12-08 MED ORDER — POLYETHYLENE GLYCOL 3350 17 G PO PACK
17.0000 g | PACK | Freq: Every day | ORAL | Status: DC
  Administered 2019-12-09 – 2019-12-10 (×2): 17 g via ORAL
  Filled 2019-12-08 (×3): qty 1

## 2019-12-08 MED ORDER — GLUCERNA SHAKE PO LIQD
237.0000 mL | Freq: Three times a day (TID) | ORAL | Status: DC
  Administered 2019-12-08 – 2019-12-09 (×4): 237 mL via ORAL

## 2019-12-08 MED ORDER — LACTATED RINGERS IV BOLUS
1000.0000 mL | Freq: Once | INTRAVENOUS | Status: AC
  Administered 2019-12-08: 1000 mL via INTRAVENOUS

## 2019-12-08 MED ORDER — SODIUM CHLORIDE 0.9 % IV SOLN: 250.0000 mL | INTRAVENOUS | Status: DC | PRN

## 2019-12-08 MED ORDER — SODIUM CHLORIDE 0.9% FLUSH
3.0000 mL | Freq: Two times a day (BID) | INTRAVENOUS | Status: DC
  Administered 2019-12-08 – 2019-12-12 (×8): 3 mL via INTRAVENOUS

## 2019-12-08 MED ORDER — MEGESTROL ACETATE 40 MG PO TABS
20.0000 mg | ORAL_TABLET | Freq: Two times a day (BID) | ORAL | Status: DC
  Administered 2019-12-08 – 2019-12-12 (×7): 20 mg via ORAL
  Filled 2019-12-08 (×6): qty 0.5
  Filled 2019-12-08 (×2): qty 1
  Filled 2019-12-08 (×2): qty 0.5

## 2019-12-08 MED ORDER — NALOXONE HCL 0.4 MG/ML IJ SOLN
INTRAMUSCULAR | Status: AC
  Administered 2019-12-08: 0.4 mg via INTRAVENOUS
  Filled 2019-12-08: qty 1

## 2019-12-08 MED ORDER — LIDOCAINE-PRILOCAINE 2.5-2.5 % EX CREA: TOPICAL_CREAM | Freq: Once | CUTANEOUS | Status: DC

## 2019-12-08 MED ORDER — LETROZOLE 2.5 MG PO TABS
2.5000 mg | ORAL_TABLET | Freq: Every day | ORAL | Status: DC
  Administered 2019-12-10 – 2019-12-12 (×3): 2.5 mg via ORAL
  Filled 2019-12-08 (×5): qty 1

## 2019-12-08 MED ORDER — SODIUM CHLORIDE 0.9 % IV BOLUS
500.0000 mL | Freq: Once | INTRAVENOUS | Status: AC
  Administered 2019-12-08: 500 mL via INTRAVENOUS

## 2019-12-08 MED ORDER — AMITRIPTYLINE HCL 10 MG PO TABS
10.0000 mg | ORAL_TABLET | Freq: Every day | ORAL | Status: DC
  Administered 2019-12-08 – 2019-12-11 (×4): 10 mg via ORAL
  Filled 2019-12-08 (×5): qty 1

## 2019-12-08 MED ORDER — OXYCODONE HCL 5 MG PO TABS
5.0000 mg | ORAL_TABLET | ORAL | Status: DC | PRN
  Administered 2019-12-11 – 2019-12-12 (×8): 5 mg via ORAL
  Filled 2019-12-08 (×8): qty 1

## 2019-12-08 NOTE — ED Triage Notes (Signed)
[facility] staff reports pt was found unresponsive with agonal respirations  approx 59min before pt arrival to ED.  EMS reports rr 6bpm, o2 sat 77% on 2liters o2.  EMS put pt on 15liters increased to 89%, co2 27.  Pt has had periods of apnea.  HR 110 and bp 120/80.  CBG 122.  Pt responsive to sternal rub.

## 2019-12-08 NOTE — ED Notes (Signed)
Patient transported to CT 

## 2019-12-08 NOTE — ED Notes (Signed)
Notified Donna Christen, Pharmacist of need for Fentanyl patch. He reported that since pt would be going up to the floor tonight to wait and have it placed there since it was stocked in the floor pyxis.

## 2019-12-08 NOTE — ED Notes (Signed)
0.4mg  narcan administered an patient immediately woke up and started screaming.
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
## 2019-12-08 NOTE — H&P (Signed)
Patient Demographics:    Krista Doyle, is a 59 y.o. 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--   Acute metabolic encephalopathy   Chemotherapy-induced peripheral neuropathy (HCC)   Constipation due to opioid therapy   Goals of care, counseling/discussion   Cancer related pain   1)Accidental opiate overdose--- responded well to Narcan, however became more sleepy after a while, required second dose of Narcan --- Patient with significant metastatic breast cancer related pain with lots of bony metastases as well as lung and liver/abdominal metastases -We need to find a balance between adequate pain control while avoiding respiratory depression and oversedation -PTA patient was on Duragesic patch 100 mcg every 72 hours, will decrease to 50 mcg daily every 72 hours -Stop p.o. Dilaudid, stop methocarbamol and stop Lyrica to avoid oversedation -Decrease Elavil to 10 mg nightly -May use as needed oxycodone for breakthrough pain -Okay to use Narcan as needed oversedation/respiratory distress  2)Stage IV Metastatic breast cancer with mets to the bone liver and lungs--- with significant underlying pain from same- metastatic disease to liver, lung and osseous metastasis involving the skull, lumbar/thoracic spine osseous metastasis, with C2 fracture, -Patient also has chemotherapy-induced peripheral neuropathy -Pain management as above #1 -Continue Femara for now -Patient functional status does not allow for palliative chemo or palliative  radiation -Per patient's oncologist MRI brain findings are more likely related to skull/bony metastases, rather than leptomeningeal or brain mets -Patient was recently treated for hypercalcemia--continue to monitor calcium levels  3) acute metabolic encephalopathy--- secondary to #1 #2 above  4) moderate protein caloric malnutrition in the setting of underlying malignancy--- okay to continue Megace for appetite stimulation -Give nutritional supplements  5)Social/Ethics--- plan of care, poor prognosis, advanced directives and goals of care discussed with patient son [name] and patient's daughter-in-law [name] -Pt patient is currently a full code with full scope of treatment -Palliative care consult requested  6)DM2-hold scheduled diabetic medications until patient is more awake and oral intake is more reliable  --use Novolog/Humalog Sliding scale insulin with Accu-Cheks/Fingersticks as ordered   7) chronic anemia of malignancy--- hemoglobin 8.4 which is around patient's recent baseline, no evidence of ongoing bleeding  8)AKI----acute kidney injury due to dehydration in the setting of poor oral intake and unresponsive episode    creatinine on admission= 1.25 , baseline creatinine = 0.6 on 12/05/2019   ,  renally adjust medications, avoid nephrotoxic agents / dehydration  / hypotension -Hydrate IV gently until oral intake is more reliable   Disposition/Need for in-Hospital Stay- patient unable to be discharged at this time due to --accidental narcotic overdose, needs for adequate pain control/adjustment of pain  medications as well as need for IV fluids for AKI until acute metabolic encephalopathy improves enough and oral intake is more reliable   Dispo: The patient is from: SN[facility]              Anticipated d/c is to: SNF              Anticipated d/c date is: 1 day              Patient currently is not medically stable to d/c. Barriers: Not Clinically Stable-  accidental narcotic overdose, needs for adequate pain control/adjustment of pain medications as well as need for IV fluids for AKI until acute metabolic encephalopathy improves enough and oral intake is more reliable  With History of - Reviewed by me  Past Medical History:  Diagnosis Date  . Asthma   . Back pain   . Breast cancer ([place]) 2008   left  . Diabetes ([place])   . Hypercholesterolemia   . Hypertension   . 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 Personal history of chemotherapy   . Personal history of radiation therapy 2008  . Port-A-Cath in place 12/01/2019      Past Surgical History:  Procedure Laterality Date  . BACK SURGERY    . BREAST LUMPECTOMY    . COLONOSCOPY     about 3 yrs ago in [place]  . KNEE SURGERY     right  . TONSILLECTOMY        Chief Complaint  Patient presents with  . unresponsive      HPI:    Krista Doyle  is a 59 y.o. female   with past medical history of HTN, HLD,  chemotherapy-induced neuropathy, diabetes mellitus, history of Lt breast cancer visually diagnosed in 2008, status post lumpectomy with subsequent radiation therapy and chemotherapy,  with recent reoccurrence of breast malignancy with diagnosis of metastatic disease to liver, lung and osseous metastasis involving the skull, lumbar/thoracic spine osseous metastasis, with C2 fracture, recently treated for hypercalcemia with IV fluids and IV Zometa, today as well she received IV fluids and calcitonin, she was discharged to [facility] rehab on 32/10/5186 returns today after being found unresponsive presumably from accidental narcotic overdose at the [facility] staff reports pt was found unresponsive with agonal respirations  approx 75mn before pt arrival to ED.  EMS reports rr 6bpm, o2 sat 77% on 2liters o2.  EMS put pt on 15liters increased to 89%, co2 27.  Pt has had periods of apnea.  HR 110 and bp 120/80.  CBG 122.  Pt responsive to sternal rub.   -Patient had  Duragesic patch 100 mcg placed on 12/07/2019, also had as needed Dilaudid, Lyrica -She responded well to Narcan initially (0.4 mg Narcan administered an patient immediately woke up and started screaming),  but became more lethargic after a few hours another dose of Narcan had to be repeated  -Patient remains largely confused and disoriented, additional history obtained from patient[place]-Apparently patient's husband is at work and unable to be reached at this time -CT head without new findings, stable calvarial osseous metastases previously noted -Creatinine is up to 1.25 with a baseline usually around 0.7 -Hemoglobin is 8.4 which is close to patient's recent baseline -Platelets and WBC WNL   Review of systems:    In addition to the HPI above,   A full Review of  Systems was done, all other systems reviewed are  negative except as noted above in HPI , .    Social History:  Reviewed by me    Social History   Tobacco Use  . Smoking status: Never Smoker  . Smokeless tobacco: Never Used  Substance Use Topics  . Alcohol use: No    Alcohol/week: 0.0 standard drinks      Family History :  Reviewed by me    Family History  Problem Relation Age of Onset  . Colon cancer Neg Hx      Home Medications:   Prior to Admission medications   Medication Sig Start Date End Date Taking? Authorizing Provider  albuterol (PROVENTIL HFA;VENTOLIN HFA) 108 (90 Base) MCG/ACT inhaler Inhale 1 puff into the lungs every 6 (six) hours as needed for wheezing or shortness of breath.   Yes [provider]  amitriptyline (ELAVIL) 10 MG tablet Take 2 tablets (20 mg total) by mouth at bedtime. Patient taking differently: Take 10 mg by mouth at bedtime.  12/05/19  Yes Tat, Shanon Brow, MD  atorvastatin (LIPITOR) 20 MG tablet Take 20 mg by mouth at bedtime.    Yes [provider]  budesonide-formoterol (SYMBICORT) 160-4.5 MCG/ACT inhaler Inhale 2 puffs into the lungs every 12  (twelve) hours as needed (shortness of breath).    Yes [provider]  fentaNYL (DURAGESIC) 100 MCG/HR Place 1 patch onto the skin every 3 (three) days. 12/05/19  Yes Tat, Shanon Brow, MD  HYDROmorphone (DILAUDID) 4 MG tablet Take 1 tablet (4 mg total) by mouth every 6 (six) hours. Patient taking differently: Take 4 mg by mouth 4 (four) times daily.  12/05/19  Yes Tat, Shanon Brow, MD  insulin glargine (SEMGLEE) 100 UNIT/ML injection Inject 10 Units into the skin daily.   Yes [provider]  Lactulose 20 GM/30ML SOLN Please take 30 ml every 3 hours until you produce a bowel movement. Then take 30 ml at bedtime daily for constipation. Patient taking differently: Take 20 g by mouth at bedtime.  11/22/19  Yes Derek Jack, MD  Lactulose 20 GM/30ML SOLN Take 20 g by mouth every 3 (three) hours as needed for mild constipation.   Yes [provider]  letrozole (FEMARA) 2.5 MG tablet Take 1 tablet (2.5 mg total) by mouth daily. 09/25/18  Yes Lockamy, Randi L, NP-C  lidocaine-prilocaine (EMLA) cream Apply a small amount to port a cath site and cover with plastic wrap 1 hour prior to chemotherapy appointments 12/01/19  Yes Derek Jack, MD  loratadine (CLARITIN) 10 MG tablet Take 10 mg by mouth daily as needed for allergies.   Yes [provider]  meclizine (ANTIVERT) 25 MG tablet Take 25 mg by mouth every 12 (twelve) hours as needed for dizziness.  03/14/19  Yes [provider]  megestrol (MEGACE) 20 MG tablet Take 20 mg by mouth 2 (two) times daily.   Yes [provider]  methocarbamol (ROBAXIN) 500 MG tablet TAKE 1 TABLET BY MOUTH EVERY 6 HOURS AS NEEDED FOR MUSCLE SPASMS. Patient taking differently: Take 500 mg by mouth every 6 (six) hours as needed for muscle spasms.  06/04/19  Yes Bayard Hugger, NP  metoprolol tartrate (LOPRESSOR) 25 MG tablet Take 25 mg by mouth 2 (two) times daily.   Yes [provider]  Multiple Vitamins-Minerals  (PRESERVISION AREDS 2) CAPS Take 1 capsule by mouth daily.   Yes [provider]  naloxegol oxalate (MOVANTIK) 25 MG TABS tablet Take 1 tablet (25 mg total) by mouth daily. 01/13/17  Yes Danella Sensing  L, NP  ondansetron (ZOFRAN) 4 MG tablet Take 4 mg by mouth 2 (two) times daily.  10/29/19  Yes [provider]  polyethylene glycol (MIRALAX / GLYCOLAX) 17 g packet Take 17 g by mouth daily. 12/05/19  Yes Tat, Shanon Brow, MD  pregabalin (LYRICA) 100 MG capsule Take 1 capsule (100 mg total) by mouth 2 (two) times daily. 12/05/19  Yes Tat, Shanon Brow, MD  prochlorperazine (COMPAZINE) 10 MG tablet Take 1 tablet (10 mg total) by mouth every 6 (six) hours as needed (Nausea or vomiting). 12/01/19  Yes Derek Jack, MD  BD PEN NEEDLE NANO 2ND GEN 32G X 4 MM MISC SMARTSIG:Injection Daily 09/19/19   [provider]  Blood Glucose Monitoring Suppl (ONETOUCH VERIO REFLECT) w/Device KIT USE TO TEST BLOOD SUGAR 2 TIMES A DAY 01/19/19   [provider]  Lancets (ONETOUCH DELICA PLUS [id number]) MISC USE TO TEST BLOOD SUGAR 2 TIMES A DAY 01/19/19   [provider]  LANTUS SOLOSTAR 100 UNIT/ML Solostar Pen Inject 10 Units into the skin at bedtime. Patient not taking: Reported on 12/08/2019 12/05/19   Orson Eva, MD  metoprolol tartrate (LOPRESSOR) 100 MG tablet Take 1 tablet (100 mg total) by mouth 2 (two) times daily. Patient not taking: Reported on 12/08/2019 12/06/19   Orson Eva, MD  [facility] VERIO test strip 1 each by Other route 2 (two) times daily.  01/19/19   [provider]     Allergies:     Allergies  Allergen Reactions  . Other     Cat/dog dander  . Pollen Extract      Physical Exam:   Vitals  Blood pressure (!) 146/67, pulse (!) 144, temperature 98.6 F (37 C), temperature source Rectal, resp. rate 20, height _0  (1.6 m), weight 51.8 kg, SpO2 100 %.  Physical Examination: General appearance - alert, chronically ill appearing, and-quite  sleepy Mental status -disoriented and lethargic  eyes - sclera anicteric Neck -soft collar  chest - clear  to auscultation bilaterally, symmetrical air movement,  Heart - S1 and S2 normal, regular  Abdomen - soft, nontender, nondistended, no masses or organomegaly Neurological -remains confused, disoriented and sleepy on and off  extremities - no pedal edema noted, intact peripheral pulses  Skin - warm, dry     Data Review:    CBC Recent Labs  Lab 12/03/19 0420 12/04/19 0920 12/05/19 0502 12/08/19 1131  WBC 8.7 8.3 8.8 9.9  HGB 9.1* 8.5* 8.5* 8.4*  HCT 29.8* 28.0* 28.3* 27.5*  PLT 329 312 296 304  MCV 83.9 84.6 84.2 84.9  MCH 25.6* 25.7* 25.3* 25.9*  MCHC 30.5 30.4 30.0 30.5  RDW 19.7* 19.9* 19.9* 20.4*  LYMPHSABS  --   --   --  2.3  MONOABS  --   --   --  1.1*  EOSABS  --   --   --  0.1  BASOSABS  --   --   --  0.1   ------------------------------------------------------------------------------------------------------------------  Chemistries  Recent Labs  Lab 12/02/19 0518 12/03/19 0420 12/04/19 0920 12/05/19 0502 12/08/19 1131  NA 137 134* 133* 136 137  K 3.8 3.6 3.4* 4.0 4.1  CL 106 105 103 107 107  CO2 21* 20* 20* 19* 19*  GLUCOSE 79 117* 135* 85 120*  BUN _1 CREATININE 0.65 0.78 0.89 0.62 1.25*  CALCIUM 8.3* 8.1* 8.1* 8.1* 9.4  MG 1.4*  --   --  1.8  --   AST  --   --  90* 117* 73*  ALT  --   --  31 47* 39  ALKPHOS  --   --  175* 184* 206*  BILITOT  --   --  0.3 0.6 0.8   ------------------------------------------------------------------------------------------------------------------ estimated creatinine clearance is 38.6 mL/min (A) (by C-G formula based on SCr of 1.25 mg/dL (H)). ------------------------------------------------------------------------------------------------------------------ No results for input(s): TSH, T4TOTAL, T3FREE, THYROIDAB in the last 72 hours.  Invalid input(s): FREET3   Coagulation profile No results  for input(s): INR, PROTIME in the last 168 hours. ------------------------------------------------------------------------------------------------------------------- No results for input(s): DDIMER in the last 72 hours. -------------------------------------------------------------------------------------------------------------------  Cardiac Enzymes No results for input(s): CKMB, TROPONINI, MYOGLOBIN in the last 168 hours.  Invalid input(s): CK ------------------------------------------------------------------------------------------------------------------ No results found for: BNP   ---------------------------------------------------------------------------------------------------------------  Urinalysis    Component Value Date/Time   COLORURINE STRAW (A) 11/29/2019 [place] 11/29/2019 1550   LABSPEC 1.008 11/29/2019 1550   PHURINE 7.0 11/29/2019 1550   GLUCOSEU NEGATIVE 11/29/2019 1550   HGBUR NEGATIVE 11/29/2019 1550   BILIRUBINUR NEGATIVE 11/29/2019 [place] 11/29/2019 1550   PROTEINUR NEGATIVE 11/29/2019 1550   NITRITE NEGATIVE 11/29/2019 1550   LEUKOCYTESUR NEGATIVE 11/29/2019 1550    ----------------------------------------------------------------------------------------------------------------   Imaging Results:    CT Head Wo Contrast  Result Date: 12/08/2019 CLINICAL DATA:  Mental status changes metastatic breast cancer EXAM: CT HEAD WITHOUT CONTRAST TECHNIQUE: Contiguous axial images were obtained from the base of the skull through the vertex without intravenous contrast. COMPARISON:  11/29/2019 FINDINGS: Brain: Stable age related atrophy pattern without acute intracranial hemorrhage, acute infarction, new mass lesion, midline shift, herniation, hydrocephalus, or extra-axial fluid collection. No focal mass effect or edema. Cisterns are patent. No cerebellar abnormality. Vascular: No hyperdense vessel or unexpected calcification.  Skull: Similar mottled irregular lytic skull lesions most pronounced in the bifrontal areas, right parietal area, right skull base and clivus. Associated right frontal dural thickening/involvement without change. Sinuses/Orbits: No acute finding. Other: None. IMPRESSION: No acute intracranial abnormality by noncontrast CT. Stable calvarial osseous metastases. Electronically Signed   By: Jerilynn Mages.  Shick M.D.   On: 12/08/2019 11:57   DG Chest Portable 1 View  Result Date: 12/08/2019 CLINICAL DATA:  Unresponsive EXAM: PORTABLE CHEST 1 VIEW COMPARISON:  Portable exam 0949 hours compared to 11/29/2019 FINDINGS: Normal heart size, mediastinal contours, and pulmonary vascularity. Central peribronchial thickening. Questionable nodularity at lateral LEFT upper and mid thorax unchanged from CT. Interstitial prominence in LEFT greater than RIGHT lungs. No segmental consolidation, pleural effusion, or pneumothorax. Gaseous distention of stomach. No acute infiltrate, pleural effusion, or pneumothorax. Osseous demineralization. IMPRESSION: Diffuse interstitial prominence greater on LEFT with persistent nodularity at the lateral aspect of the LEFT apex corresponding to nodularity on CT. No new abnormalities identified. Electronically Signed   By: Lavonia Dana M.D.   On: 12/08/2019 10:31    Radiological Exams on Admission: CT Head Wo Contrast  Result Date: 12/08/2019 CLINICAL DATA:  Mental status changes metastatic breast cancer EXAM: CT HEAD WITHOUT CONTRAST TECHNIQUE: Contiguous axial images were obtained from the base of the skull through the vertex without intravenous contrast. COMPARISON:  11/29/2019 FINDINGS: Brain: Stable age related atrophy pattern without acute intracranial hemorrhage, acute infarction, new mass lesion, midline shift, herniation, hydrocephalus, or extra-axial fluid collection. No focal mass effect or edema. Cisterns are patent. No cerebellar abnormality. Vascular: No hyperdense vessel or unexpected  calcification. Skull: Similar mottled irregular lytic skull lesions most pronounced in the bifrontal areas, right parietal area, right skull base and clivus.  Associated right frontal dural thickening/involvement without change. Sinuses/Orbits: No acute finding. Other: None. IMPRESSION: No acute intracranial abnormality by noncontrast CT. Stable calvarial osseous metastases. Electronically Signed   By: Jerilynn Mages.  Shick M.D.   On: 12/08/2019 11:57   DG Chest Portable 1 View  Result Date: 12/08/2019 CLINICAL DATA:  Unresponsive EXAM: PORTABLE CHEST 1 VIEW COMPARISON:  Portable exam 0949 hours compared to 11/29/2019 FINDINGS: Normal heart size, mediastinal contours, and pulmonary vascularity. Central peribronchial thickening. Questionable nodularity at lateral LEFT upper and mid thorax unchanged from CT. Interstitial prominence in LEFT greater than RIGHT lungs. No segmental consolidation, pleural effusion, or pneumothorax. Gaseous distention of stomach. No acute infiltrate, pleural effusion, or pneumothorax. Osseous demineralization. IMPRESSION: Diffuse interstitial prominence greater on LEFT with persistent nodularity at the lateral aspect of the LEFT apex corresponding to nodularity on CT. No new abnormalities identified. Electronically Signed   By: Lavonia Dana M.D.   On: 12/08/2019 10:31    DVT Prophylaxis -SCD /heparin AM Labs Ordered, also please review Full Orders  Family Communication: Admission, patients condition and plan of care including tests being ordered have been discussed with the patient and son who indicate understanding and agree with the plan   Code Status - Full Code  Likely DC to   improves enough and  oral intake is more reliable   Condition   stable  Roxan Hockey M.D on 12/08/2019 at 4:35 PM Go to [email or web address] -  for contact info  [facility] - Office  [phone]
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
## 2019-12-09 ENCOUNTER — Encounter (HOSPITAL_COMMUNITY): Payer: Self-pay | Admitting: Family Medicine

## 2019-12-09 ENCOUNTER — Other Ambulatory Visit: Payer: Self-pay

## 2019-12-09 DIAGNOSIS — Z7189 Other specified counseling: Secondary | ICD-10-CM

## 2019-12-09 DIAGNOSIS — C50919 Malignant neoplasm of unspecified site of unspecified female breast: Secondary | ICD-10-CM

## 2019-12-09 DIAGNOSIS — Z515 Encounter for palliative care: Secondary | ICD-10-CM

## 2019-12-09 LAB — BASIC METABOLIC PANEL
Anion gap: 8 (ref 5–15)
BUN: 16 mg/dL (ref 8–23)
CO2: 23 mmol/L (ref 22–32)
Calcium: 8.9 mg/dL (ref 8.9–10.3)
Chloride: 106 mmol/L (ref 98–111)
Creatinine, Ser: 0.74 mg/dL (ref 0.44–1.00)
GFR, Estimated: >60 mL/min (ref >60)
Glucose, Bld: 150 mg/dL — ABNORMAL HIGH (ref 70–99)
Potassium: 3.6 mmol/L (ref 3.5–5.1)
Sodium: 137 mmol/L (ref 135–145)

## 2019-12-09 LAB — GLUCOSE, CAPILLARY
Glucose-Capillary: 167 mg/dL — ABNORMAL HIGH (ref 70–99)
Glucose-Capillary: 155 mg/dL — ABNORMAL HIGH (ref 70–99)
Glucose-Capillary: 133 mg/dL — ABNORMAL HIGH (ref 70–99)
Glucose-Capillary: 158 mg/dL — ABNORMAL HIGH (ref 70–99)
Glucose-Capillary: 175 mg/dL — ABNORMAL HIGH (ref 70–99)

## 2019-12-09 LAB — CBC
HCT: 26.5 % — ABNORMAL LOW (ref 36.0–46.0)
Hemoglobin: 7.8 g/dL — ABNORMAL LOW (ref 12.0–15.0)
MCH: 25.7 pg — ABNORMAL LOW (ref 26.0–34.0)
MCHC: 29.4 g/dL — ABNORMAL LOW (ref 30.0–36.0)
MCV: 87.2 fL (ref 80.0–100.0)
Platelets: 280 10*3/uL (ref 150–400)
RBC: 3.04 MIL/uL — ABNORMAL LOW (ref 3.87–5.11)
RDW: 20.8 % — ABNORMAL HIGH (ref 11.5–15.5)
WBC: 8.3 10*3/uL (ref 4.0–10.5)
nRBC: 2.4 % — ABNORMAL HIGH (ref 0.0–0.2)

## 2019-12-09 MED ORDER — CHLORHEXIDINE GLUCONATE CLOTH 2 % EX PADS
6.0000 | MEDICATED_PAD | Freq: Every day | CUTANEOUS | Status: DC
  Administered 2019-12-09 – 2019-12-12 (×2): 6 via TOPICAL

## 2019-12-09 MED ORDER — METOPROLOL TARTRATE 50 MG PO TABS
50.0000 mg | ORAL_TABLET | Freq: Two times a day (BID) | ORAL | Status: DC
  Administered 2019-12-09 – 2019-12-12 (×6): 50 mg via ORAL
  Filled 2019-12-09 (×6): qty 1

## 2019-12-09 NOTE — Progress Notes (Signed)
No urinary output so far this shift. Bladder scan 412 ml so far - Dr. Joesph Fillers notified

## 2019-12-09 NOTE — Progress Notes (Addendum)
Initial Nutrition Assessment  DOCUMENTATION CODES:   Severe malnutrition in context of chronic illness  INTERVENTION:  D/c Glucerna Shake po TID, each supplement provides 220 kcal and 10 grams of protein   Provide Ensure Enlive TID, each supplement provides 350 kcal and 20 grams of protein    NUTRITION DIAGNOSIS:   Severe Malnutrition related to catabolic illness (metastatic cancer) as evidenced by energy intake < 75% for > or equal to 1 month, moderate fat depletion, severe fat depletion, moderate muscle depletion, severe muscle depletion, percent weight loss.   GOAL:   (Meet needs as able based on patient healthcare wishes) and ability to take oral po's  MONITOR:  Supplement acceptance, Labs, Weight trends, Skin, PO intake  REASON FOR ASSESSMENT:   Consult Assessment of nutrition requirement/status  ASSESSMENT: patient is a 59 yo female with metastatic breast cancer (stage IV) and bony metastases, liver/abdominal metastasis. She presents with accidental opiate overdose, AKI, Chronic anemia, DM2, Moderate malnutrition.  Palliative consulted and goals of care discussions ongoing.   Patient is up in chair this morning and husband is at bedside. She was not able to eat breakfast but says she is drinking Ensure (any flavor). Talked with her about food preferences and encouraged her to let our nutrition services staff know what seems good to her for meals. Her husband is going to bring her [place] soup at lunch today.   Patient has history of unplanned weight loss  6.3 kg (11%) x 2 months. Severe loss for timeframe. Worsening nutrition status with continued poor oral intake and wt loss.   Patient provided case of Ensure [facility] 10/28. Education related to how to increase calories and protein and encouraged to eat small frequent meals.  Followed by Outpatient RD through [facility]. History of poor po intake the past month.   Medications reviewed and include: Megace, Insulin, Lopressor,  Senna.  IV-D5-0.45% NS @ 125 ml/hr    Labs: BMP Latest Ref Rng & Units 12/09/2019 12/08/2019 12/05/2019  Glucose 70 - 99 mg/dL 150(H) 120(H) 85  BUN 8 - 23 mg/dL 16 23 15   Creatinine 0.44 - 1.00 mg/dL 0.74 1.25(H) 0.62  BUN/Creat Ratio 6 - 22 (calc) - - -  Sodium 135 - 145 mmol/L 137 137 136  Potassium 3.5 - 5.1 mmol/L 3.6 4.1 4.0  Chloride 98 - 111 mmol/L 106 107 107  CO2 22 - 32 mmol/L 23 19(L) 19(L)  Calcium 8.9 - 10.3 mg/dL 8.9 9.4 8.1(L)     NUTRITION - FOCUSED PHYSICAL EXAM:    Most Recent Value  Orbital Region Moderate depletion  Upper Arm Region Severe depletion  Thoracic and Lumbar Region Severe depletion  Buccal Region Moderate depletion  Clavicle Bone Region Severe depletion  Clavicle and Acromion Bone Region Severe depletion  Scapular Bone Region Unable to assess  Dorsal Hand Moderate depletion  Anterior Thigh Region Severe depletion  Edema (RD Assessment) None  Hair Reviewed  Eyes Reviewed  Mouth Reviewed  Skin Reviewed  Nails Reviewed      Diet Order:   Diet Order            Diet Carb Modified Fluid consistency: Thin; Room service appropriate? Yes  Diet effective now                 EDUCATION NEEDS:  Education needs have been addressed    Skin:  Skin Assessment: Reviewed RN Assessment  Last BM:  11/7  Height:   Ht Readings from Last 1 Encounters:  12/08/19 5'  3" (1.6 m)    Weight:   Wt Readings from Last 1 Encounters:  12/08/19 51.8 kg    Ideal Body Weight:   52 kg  BMI:  Body mass index is 20.23 kg/m.  Estimated Nutritional Needs:   Kcal:  1600-1800  Protein:  80-90 gr  Fluid:  1.6-1.8 liters daily  Colman Cater MS,RD,CSG,LDN Pager: Shea Evans

## 2019-12-09 NOTE — Consult Note (Signed)
Consultation Note Date: 12/09/2019   Patient Name: Krista Doyle  DOB: 1960/08/21  MRN: [id number]  Age / Sex: 59 y.o., female  PCP: [name], MD Referring Physician: Roxan Hockey, MD  Reason for Consultation: Establishing goals of care and Psychosocial/spiritual support  HPI/Patient Profile: 59 y.o. 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   Clinical Assessment and Goals of Care: I have reviewed medical records including EPIC notes, labs and imaging, received report from attending, examined the patient and met at bedside with spouse of 5 years, Gene, and son Coralyn Pear from [place], to discuss diagnosis prognosis, GOC, EOL wishes, disposition and options.  I introduced Palliative Medicine as specialized medical care for people living with serious illness. It focuses on providing relief from the symptoms and stress of a serious illness.   We discussed a brief life review of the patient.  Mr. and Mrs. Colburn have been married for 5 years.  Mrs. Kluck has 5 children, 2 of which are adopted.  Her son, Coralyn Pear, from [place], is at bedside  As far as functional and nutritional status, Mr. Gage Treiber share that Mrs. Vanmeter has been sleeping more, but not over 12 hours a day.  She is noted to have weight loss.  We discussed her current illness and what it means in the larger context of her on-going co-morbidities.  Natural disease trajectory and expectations at EOL were discussed.  I share a diagram of the chronic illness pathway, what is normal and expected.  I share a diagram showing the time frames for starting palliative versus hospice care.     The difference between aggressive medical intervention and comfort  care was considered in light of the patient's goals of care.  I share that at this point Mrs. Mensing is too weak for any chemotherapy or biological therapies.  Both Daryl and Mr. Schecter shared that they would like to have a consultation with the oncologist.  This is shared with attending.  Advanced directives, concepts specific to code status, were considered and discussed.  Family states that they have not had CODE STATUS discussions with Mrs. Justin Mend.  At this point she remains full scope/full code  Hospice and Palliative Care services were discussed in detail.  Outpatient services for palliative and hospice care were discussed in detail. Prognosis discussed. If Mrs. Mcconahy does not wake up enough to eat and drink, days to weeks would be expected.  Even with aggressive chemotherapy/cancer treatment, 3 to 6 months would be expected based on severity of cancer burden, frailty, poor functional status, weight loss  Questions and concerns were addressed.  The family was encouraged to call with questions or concerns.     HCPOA   NEXT OF KIN -spouse of 5 years, Gene Hack.  Mrs. Salim has 5 children, 2 adopted.  Son Coralyn Pear from [place] is at  bedside today.    SUMMARY OF RECOMMENDATIONS   At this point full scope/full code Family requesting oncology consult Continue goals of care discussion Continue CODE STATUS discussions  Code Status/Advance Care Planning:  Full code -CODE STATUS discussed at length.  Symptom Management:   Per hospitalist, no additional needs at this time.   Palliative Prophylaxis:   Frequent Pain Assessment, Oral Care and Turn Reposition  Additional Recommendations (Limitations, Scope, Preferences):  Full Scope Treatment  Psycho-social/Spiritual:   Desire for further Chaplaincy support:no  Additional Recommendations: Caregiving  Support/Resources and Education on Hospice  Prognosis:   Unable to determine, based on outcomes.  If Mrs. Pfluger does not wake up enough to  eat and drink, days to weeks would be expected.  Even with aggressive chemotherapy/cancer treatment, 3 to 6 months would be expected based on severity of cancer burden, frailty, poor functional status, weight loss.  Discharge Planning: To be determined, based on outcomes.  Back to short-term rehab versus residential hospice      Primary Diagnoses: Present on Admission: . Acute metabolic encephalopathy . Overdose opiate, accidental or unintentional, initial encounter ([place]) . Chemotherapy-induced peripheral neuropathy ([place]) . Stage IV Metastatic Breast Cancer with Mets to Bone/Liver/Lungs-- . Cancer related pain . Constipation due to opioid therapy   I have reviewed the medical record, interviewed the patient and family, and examined the patient. The following aspects are pertinent.  Past Medical History:  Diagnosis Date  . Asthma   . Back pain   . Breast cancer ([place]) 2008   left  . Diabetes ([place])   . Hypercholesterolemia   . Hypertension   . 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 Personal history of chemotherapy   . Personal history of radiation therapy 2008  . Port-A-Cath in place 12/01/2019   Social History   Socioeconomic History  . Marital status: Married    Spouse name: Not on file  . Number of children: Not on file  . Years of education: Not on file  . Highest education level: Not on file  Occupational History  . Occupation: Disabled  Tobacco Use  . Smoking status: Never Smoker  . Smokeless tobacco: Never Used  Substance and Sexual Activity  . Alcohol use: No    Alcohol/week: 0.0 standard drinks  . Drug use: No  . Sexual activity: Yes  Other Topics Concern  . Not on file  Social History Narrative  . Not on file   Social Determinants of Health   Financial Resource Strain:   . Difficulty of Paying Living Expenses: Not on file  Food Insecurity:   . Worried About Charity fundraiser in the Last Year: Not on file  . Ran Out of Food in the Last Year: Not  on file  Transportation Needs:   . Lack of Transportation (Medical): Not on file  . Lack of Transportation (Non-Medical): Not on file  Physical Activity:   . Days of Exercise per Week: Not on file  . Minutes of Exercise per Session: Not on file  Stress:   . Feeling of Stress : Not on file  Social Connections:   . Frequency of Communication with Friends and Family: Not on file  . Frequency of Social Gatherings with Friends and Family: Not on file  . Attends Religious Services: Not on file  . Active Member of Clubs or Organizations: Not on file  . Attends Archivist Meetings: Not on file  . Marital Status: Not on file  Family History  Problem Relation Age of Onset  . Colon cancer Neg Hx    Scheduled Meds: . amitriptyline  10 mg Oral QHS  . atorvastatin  20 mg Oral QHS  . Chlorhexidine Gluconate Cloth  6 each Topical Daily  . feeding supplement (GLUCERNA SHAKE)  237 mL Oral TID BM  . fentaNYL  1 patch Transdermal Q72H  . heparin  5,000 Units Subcutaneous Q8H  . insulin aspart  0-5 Units Subcutaneous QHS  . insulin aspart  0-6 Units Subcutaneous TID WC  . lactulose  20 g Oral Daily  . letrozole  2.5 mg Oral Daily  . lidocaine-prilocaine   Topical Once  . megestrol  20 mg Oral BID  . metoprolol tartrate  50 mg Oral BID  . mometasone-formoterol  2 puff Inhalation BID  . polyethylene glycol  17 g Oral Daily  . senna-docusate  2 tablet Oral BID  . sodium chloride flush  3 mL Intravenous Q12H  . sodium chloride flush  3 mL Intravenous Q12H   Continuous Infusions: . sodium chloride    . dextrose 5 % and 0.45% NaCl 125 mL/hr at 12/08/19 1602   PRN Meds:.sodium chloride, acetaminophen **OR** acetaminophen, bisacodyl, LORazepam, naLOXone (NARCAN)  injection, ondansetron **OR** ondansetron (ZOFRAN) IV, oxyCODONE, sodium chloride flush, traZODone Medications Prior to Admission:  Prior to Admission medications   Medication Sig Start Date End Date Taking? Authorizing  Provider  albuterol (PROVENTIL HFA;VENTOLIN HFA) 108 (90 Base) MCG/ACT inhaler Inhale 1 puff into the lungs every 6 (six) hours as needed for wheezing or shortness of breath.   Yes [provider]  amitriptyline (ELAVIL) 10 MG tablet Take 2 tablets (20 mg total) by mouth at bedtime. Patient taking differently: Take 10 mg by mouth at bedtime.  12/05/19  Yes Tat, Shanon Brow, MD  atorvastatin (LIPITOR) 20 MG tablet Take 20 mg by mouth at bedtime.    Yes [provider]  budesonide-formoterol (SYMBICORT) 160-4.5 MCG/ACT inhaler Inhale 2 puffs into the lungs every 12 (twelve) hours as needed (shortness of breath).    Yes [provider]  fentaNYL (DURAGESIC) 100 MCG/HR Place 1 patch onto the skin every 3 (three) days. 12/05/19  Yes Tat, Shanon Brow, MD  HYDROmorphone (DILAUDID) 4 MG tablet Take 1 tablet (4 mg total) by mouth every 6 (six) hours. Patient taking differently: Take 4 mg by mouth 4 (four) times daily.  12/05/19  Yes Tat, Shanon Brow, MD  insulin glargine (SEMGLEE) 100 UNIT/ML injection Inject 10 Units into the skin daily.   Yes [provider]  Lactulose 20 GM/30ML SOLN Please take 30 ml every 3 hours until you produce a bowel movement. Then take 30 ml at bedtime daily for constipation. Patient taking differently: Take 20 g by mouth at bedtime.  11/22/19  Yes Derek Jack, MD  Lactulose 20 GM/30ML SOLN Take 20 g by mouth every 3 (three) hours as needed for mild constipation.   Yes [provider]  letrozole (FEMARA) 2.5 MG tablet Take 1 tablet (2.5 mg total) by mouth daily. 09/25/18  Yes Lockamy, Randi L, NP-C  lidocaine-prilocaine (EMLA) cream Apply a small amount to port a cath site and cover with plastic wrap 1 hour prior to chemotherapy appointments 12/01/19  Yes Derek Jack, MD  loratadine (CLARITIN) 10 MG tablet Take 10 mg by mouth daily as needed for allergies.   Yes [provider]  meclizine (ANTIVERT) 25 MG tablet Take 25 mg by mouth  every 12 (twelve) hours as needed for dizziness.  03/14/19  Yes [provider]  megestrol (MEGACE) 20 MG tablet Take 20 mg by mouth 2 (two) times daily.   Yes [provider]  methocarbamol (ROBAXIN) 500 MG tablet TAKE 1 TABLET BY MOUTH EVERY 6 HOURS AS NEEDED FOR MUSCLE SPASMS. Patient taking differently: Take 500 mg by mouth every 6 (six) hours as needed for muscle spasms.  06/04/19  Yes Bayard Hugger, NP  metoprolol tartrate (LOPRESSOR) 25 MG tablet Take 25 mg by mouth 2 (two) times daily.   Yes [provider]  Multiple Vitamins-Minerals (PRESERVISION AREDS 2) CAPS Take 1 capsule by mouth daily.   Yes [provider]  naloxegol oxalate (MOVANTIK) 25 MG TABS tablet Take 1 tablet (25 mg total) by mouth daily. 01/13/17  Yes Bayard Hugger, NP  ondansetron (ZOFRAN) 4 MG tablet Take 4 mg by mouth 2 (two) times daily.  10/29/19  Yes [provider]  polyethylene glycol (MIRALAX / GLYCOLAX) 17 g packet Take 17 g by mouth daily. 12/05/19  Yes Tat, Shanon Brow, MD  pregabalin (LYRICA) 100 MG capsule Take 1 capsule (100 mg total) by mouth 2 (two) times daily. 12/05/19  Yes Tat, Shanon Brow, MD  prochlorperazine (COMPAZINE) 10 MG tablet Take 1 tablet (10 mg total) by mouth every 6 (six) hours as needed (Nausea or vomiting). 12/01/19  Yes Derek Jack, MD  BD PEN NEEDLE NANO 2ND GEN 32G X 4 MM MISC SMARTSIG:Injection Daily 09/19/19   [provider]  Blood Glucose Monitoring Suppl (ONETOUCH VERIO REFLECT) w/Device KIT USE TO TEST BLOOD SUGAR 2 TIMES A DAY 01/19/19   [provider]  Lancets (ONETOUCH DELICA PLUS ATFTDD22G) MISC USE TO TEST BLOOD SUGAR 2 TIMES A DAY 01/19/19   [provider]  LANTUS SOLOSTAR 100 UNIT/ML Solostar Pen Inject 10 Units into the skin at bedtime. Patient not taking: Reported on 12/08/2019 12/05/19   Orson Eva, MD  metoprolol tartrate (LOPRESSOR) 100 MG tablet Take 1 tablet (100 mg total) by mouth 2 (two) times  daily. Patient not taking: Reported on 12/08/2019 12/06/19   Orson Eva, MD  [facility] VERIO test strip 1 each by Other route 2 (two) times daily.  01/19/19   [provider]   Allergies  Allergen Reactions  . Other     Cat/dog dander  . Pollen Extract    Review of Systems  Unable to perform ROS: Patient unresponsive    Physical Exam Vitals and nursing note reviewed.  Constitutional:      General: She is not in acute distress.    Appearance: She is ill-appearing.  HENT:     Head: Normocephalic.  Cardiovascular:     Rate and Rhythm: Normal rate.  Pulmonary:     Effort: Pulmonary effort is normal. No respiratory distress.  Musculoskeletal:        General: No swelling.     Comments: Frail and thin  Skin:    General: Skin is warm and dry.  Neurological:     Comments: Does not respond to voice or touch     Vital Signs: BP 96/60   Pulse 97   Temp (!) 97.3 F (36.3 C) (Axillary)   Resp 15   Ht $R'5\' 3"'Iy$  (1.6 m)   Wt 51.8 kg   SpO2 100%   BMI 20.23 kg/m  Pain Scale: 0-10   Pain Score: Asleep   SpO2: SpO2: 100 % O2 Device:SpO2: 100 % O2 Flow Rate: .O2 Flow Rate (L/min): 3 L/min  IO: Intake/output summary:  Intake/Output Summary (Last 24 hours) at 12/09/2019 1657 Last data filed at 12/09/2019 0531 Gross per 24 hour  Intake 426.59 ml  Output 1100 ml  Net -673.41 ml    LBM: Last BM Date: 12/08/19 Baseline Weight: Weight: 51.8 kg Most recent weight: Weight: 51.8 kg     Palliative Assessment/Data:   Flowsheet Rows     Most Recent Value  Intake Tab  Referral Department Hospitalist  Unit at Time of Referral Cardiac/Telemetry Unit  Palliative Care Primary Diagnosis Other (Comment)  Date Notified 12/08/19  Palliative Care Type Return patient Palliative Care  Reason for referral Clarify Goals of Care  Date of Admission 12/08/19  Date first seen by Palliative Care 12/09/19  # of days Palliative referral response time 1 Day(s)  # of days IP prior to  Palliative referral 0  Clinical Assessment  Palliative Performance Scale Score 10%  Pain Max last 24 hours Not able to report  Pain Min Last 24 hours Not able to report  Dyspnea Max Last 24 Hours Not able to report  Dyspnea Min Last 24 hours Not able to report  Psychosocial & Spiritual Assessment  Palliative Care Outcomes      Time In: 1420 Time Out: 1540 Time Total: 80 minutes  Greater than 50%  of this time was spent counseling and coordinating care related to the above assessment and plan.  Signed by: Drue Novel, NP   Please contact Palliative Medicine Team phone at [phone] for questions and concerns.  For individual provider: See Shea Evans

## 2019-12-09 NOTE — Progress Notes (Addendum)
Patient Demographics:    Krista Doyle, is a 59 y.o. female, DOB - 08/07/1960, QMG:[id number]  Admit date - 12/08/2019   Admitting Physician Shalva Rozycki Denton Brick, MD  Outpatient Primary MD for the patient is [name], MD  LOS - 0   Chief Complaint  Patient presents with  . 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 female  with past medical history of HTN, HLD,  chemotherapy-induced neuropathy, diabetes mellitus, history of Lt breast cancer visually diagnosed in 2008, status post lumpectomy with subsequent radiation therapy and chemotherapy, with recent reoccurrence of breast malignancy with diagnosis of metastatic disease to liver, lung and osseous metastasis involving the skull, lumbar/thoracic spine osseous metastasis, with C2 fracture, recently treated for hypercalcemia, admitted on 12/08/19 after accidental opiate overdose at [facility]   A/p 1)Accidental Opiate Overdose--- responded well to Narcan x 2 doses,  --- Patient with significant metastatic breast cancer related pain with lots of bony metastases as well as lung and liver/abdominal metastases -We need to find a balance between adequate pain control while avoiding respiratory depression and  oversedation -PTA patient was on Duragesic patch 100 mcg every 72 hours, will decrease to 50 mcg daily every 72 hours -Stopped p.o. Dilaudid, stopped methocarbamol and stopped Lyrica to avoid oversedation -Decreased Elavil to 10 mg nightly -May use as needed oxycodone for breakthrough pain -Okay to use Narcan as needed oversedation/respiratory distress --Pain control remains challenging  2)Stage IV Metastatic breast cancer with mets to the bone liver and lungs--- with significant underlying pain from same- metastatic disease to liver, lung and osseous metastasis involving the skull, lumbar/thoracic spine osseous metastasis, with C2 fracture, -Patient also has chemotherapy-induced peripheral neuropathy -Pain management as above #1 -Continue Femara for now -Patient functional status does Not allow for palliative chemo or palliative radiation -Per patient's oncologist MRI brain findings are more likely related to skull/bony metastases, rather than leptomeningeal or brain mets -Patient was recently treated for hypercalcemia--continue to monitor calcium levels -Palliative care consult appreciated -Pain control remains challenging  3)Acute metabolic encephalopathy--- secondary to #1 #2 above  4)Moderate Protein caloric malnutrition in the setting of underlying malignancy--- okay to continue Megace for appetite stimulation -Give nutritional supplements  5)Social/Ethics--- plan of care, poor prognosis, advanced directives and goals of care discussed with patient son Mr Krista Doyle and patient's daughter-in-law Ms Krista Doyle as well as pt's Husband -Pt  is currently a full code with full scope of treatment -Palliative care consult requested  6)DM2-hold scheduled diabetic medications until patient is more awake and oral intake is more reliable  --use Novolog/Humalog Sliding scale  insulin with Accu-Cheks/Fingersticks as ordered   7) chronic anemia of malignancy--- hemoglobin 8.4 which is  around patient's recent baseline, no evidence of ongoing bleeding  8)AKI----acute kidney injury due to dehydration in the setting of poor oral intake and unresponsive episode    creatinine on admission= 1.25 , baseline creatinine = 0.6 on 12/05/2019   ,  renally adjust medications, avoid nephrotoxic agents / dehydration  / hypotension AKI has resolved with Hydration, Creatine has Normalized -Hydrate IV gently until oral intake is more reliable  9)Urinary Retention-- Requiring in and out catherization---suspect opiate related  Disposition/Need for in-Hospital Stay- patient unable to be discharged at this time due to --accidental narcotic overdose, needs for adequate pain control/adjustment of pain medications as well as need for IV fluids for AKI until acute metabolic encephalopathy improves enough and oral intake is more reliable   Disposition: The patient is from: SN[facility]  Anticipated d/c is to: SNF  Anticipated d/c date is: 1 day  Patient currently is not medically stable to d/c. Barriers: Not Clinically Stable- accidental narcotic overdose, needs for adequate pain control/adjustment of pain medications as well as need for IV fluids for AKI until acute metabolic encephalopathy improves enough and oral intake is more reliable  Code Status : full  Family Communication:   discussed with patient son Mr Krista Doyle and patient's daughter-in-law Ms Krista Doyle as well as pt's Husband Consults  :  palliative  DVT Prophylaxis  : - Heparin - SCDs    Lab Results  Component Value Date   PLT 280 12/09/2019    Inpatient Medications  Scheduled Meds: . amitriptyline  10 mg Oral QHS  . atorvastatin  20 mg Oral QHS  . Chlorhexidine Gluconate Cloth  6 each Topical Daily  . feeding supplement (GLUCERNA SHAKE)  237 mL Oral TID BM  . fentaNYL  1 patch Transdermal Q72H  . heparin  5,000 Units Subcutaneous Q8H  . insulin aspart  0-5 Units Subcutaneous  QHS  . insulin aspart  0-6 Units Subcutaneous TID WC  . lactulose  20 g Oral Daily  . letrozole  2.5 mg Oral Daily  . lidocaine-prilocaine   Topical Once  . megestrol  20 mg Oral BID  . metoprolol tartrate  50 mg Oral BID  . mometasone-formoterol  2 puff Inhalation BID  . polyethylene glycol  17 g Oral Daily  . senna-docusate  2 tablet Oral BID  . sodium chloride flush  3 mL Intravenous Q12H  . sodium chloride flush  3 mL Intravenous Q12H   Continuous Infusions: . sodium chloride    . dextrose 5 % and 0.45% NaCl 125 mL/hr at 12/08/19 1602   PRN Meds:.sodium chloride, acetaminophen **OR** acetaminophen, bisacodyl, LORazepam, naLOXone (NARCAN)  injection, ondansetron **OR** ondansetron (ZOFRAN) IV, oxyCODONE, sodium chloride flush, traZODone    Anti-infectives (From admission, onward)   None        Objective:   Vitals:   12/09/19 0900 12/09/19 1100 12/09/19 1200 12/09/19 1600  BP: (!) 129/103 (!) 104/52 96/60   Pulse: (!) 128 (!) 104 97   Resp: _0 Temp:  98.6 F (37 C)  (!) 97.3 F (36.3 C)  TempSrc:    Axillary  SpO2: 100% 97% 100%   Weight:      Height:        Wt Readings from Last 3 Encounters:  12/08/19 51.8 kg  12/01/19 51.8 kg  11/19/19 49.4 kg     Intake/Output Summary (Last 24  hours) at 12/09/2019 1703 Last data filed at 12/09/2019 0531 Gross per 24 hour  Intake 426.59 ml  Output 1100 ml  Net -673.41 ml     Physical Exam  Gen:- Awake Alert, more awake, chronically ill-appearing HEENT:- [place].AT, No sclera icterus Neck-Supple Neck,No JVD,.  Lungs-  CTAB , fair symmetrical air movement CV- S1, S2 normal, regular  Abd-  +ve B.Sounds, Abd Soft, No tenderness,    Extremity/Skin:- No  edema, pedal pulses present  Psych-affect is appropriate, oriented x3 Neuro-generalized weakness, no new focal deficits, no tremors   Data Review:   Micro Results Recent Results (from the past 240 hour(s))  MRSA PCR Screening     Status: Abnormal   Collection  Time: 11/30/19 12:04 AM   Specimen: Nasopharyngeal  Result Value Ref Range Status   MRSA by PCR POSITIVE (A) NEGATIVE Final    Comment:        The GeneXpert MRSA Assay (FDA approved for NASAL specimens only), is one component of a comprehensive MRSA colonization surveillance program. It is not intended to diagnose MRSA infection nor to guide or monitor treatment for MRSA infections. RESULT CALLED TO, READ BACK BY AND VERIFIED WITH: ALSTON @ 1601 ON [id number] BY HENDERSON L. Performed at [facility], [address], [place], [place] [address]   Respiratory Panel by RT PCR (Flu A&B, Covid) - Nasopharyngeal Swab     Status: None   Collection Time: 12/05/19  3:28 PM   Specimen: Nasopharyngeal Swab  Result Value Ref Range Status   SARS Coronavirus 2 by RT PCR NEGATIVE NEGATIVE Final    Comment: (NOTE) SARS-CoV-2 target nucleic acids are NOT DETECTED.  The SARS-CoV-2 RNA is generally detectable in upper respiratoy specimens during the acute phase of infection. The lowest concentration of SARS-CoV-2 viral copies this assay can detect is 131 copies/mL. A negative result does not preclude SARS-Cov-2 infection and should not be used as the sole basis for treatment or other patient management decisions. A negative result may occur with  improper specimen collection/handling, submission of specimen other than nasopharyngeal swab, presence of viral mutation(s) within the areas targeted by this assay, and inadequate number of viral copies (<131 copies/mL). A negative result must be combined with clinical observations, patient history, and epidemiological information. The expected result is Negative.  Fact Sheet for Patients:  PinkCheek.be  Fact Sheet for Healthcare Providers:  GravelBags.it  This test is no t yet approved or cleared by the [place] FDA and  has been authorized for detection and/or diagnosis of SARS-CoV-2 by  FDA under an Emergency Use Authorization (EUA). This EUA will remain  in effect (meaning this test can be used) for the duration of the COVID-19 declaration under Section 564(b)(1) of the Act, 21 U.S.C. section 360bbb-3(b)(1), unless the authorization is terminated or revoked sooner.     Influenza A by PCR NEGATIVE NEGATIVE Final   Influenza B by PCR NEGATIVE NEGATIVE Final    Comment: (NOTE) The Xpert Xpress SARS-CoV-2/FLU/RSV assay is intended as an aid in  the diagnosis of influenza from Nasopharyngeal swab specimens and  should not be used as a sole basis for treatment. Nasal washings and  aspirates are unacceptable for Xpert Xpress SARS-CoV-2/FLU/RSV  testing.  Fact Sheet for Patients: PinkCheek.be  Fact Sheet for Healthcare Providers: GravelBags.it  This test is not yet approved or cleared by the [place] FDA and  has been authorized for detection and/or diagnosis of SARS-CoV-2 by  FDA under an Emergency Use Authorization (EUA). This  EUA will remain  in effect (meaning this test can be used) for the duration of the  Covid-19 declaration under Section 564(b)(1) of the Act, 21  U.S.C. section 360bbb-3(b)(1), unless the authorization is  terminated or revoked. Performed at [facility], [address] [place], [place] [address]   Respiratory Panel by RT PCR (Flu A&B, Covid) - Nasopharyngeal Swab     Status: None   Collection Time: 12/08/19 11:31 AM   Specimen: Nasopharyngeal Swab  Result Value Ref Range Status   SARS Coronavirus 2 by RT PCR NEGATIVE NEGATIVE Final    Comment: (NOTE) SARS-CoV-2 target nucleic acids are NOT DETECTED.  The SARS-CoV-2 RNA is generally detectable in upper respiratoy specimens during the acute phase of infection. The lowest concentration of SARS-CoV-2 viral copies this assay can detect is 131 copies/mL. A negative result does not preclude SARS-Cov-2 infection and should not be used  as the sole basis for treatment or other patient management decisions. A negative result may occur with  improper specimen collection/handling, submission of specimen other than nasopharyngeal swab, presence of viral mutation(s) within the areas targeted by this assay, and inadequate number of viral copies (<131 copies/mL). A negative result must be combined with clinical observations, patient history, and epidemiological information. The expected result is Negative.  Fact Sheet for Patients:  PinkCheek.be  Fact Sheet for Healthcare Providers:  GravelBags.it  This test is no t yet approved or cleared by the [place] FDA and  has been authorized for detection and/or diagnosis of SARS-CoV-2 by FDA under an Emergency Use Authorization (EUA). This EUA will remain  in effect (meaning this test can be used) for the duration of the COVID-19 declaration under Section 564(b)(1) of the Act, 21 U.S.C. section 360bbb-3(b)(1), unless the authorization is terminated or revoked sooner.     Influenza A by PCR NEGATIVE NEGATIVE Final   Influenza B by PCR NEGATIVE NEGATIVE Final    Comment: (NOTE) The Xpert Xpress SARS-CoV-2/FLU/RSV assay is intended as an aid in  the diagnosis of influenza from Nasopharyngeal swab specimens and  should not be used as a sole basis for treatment. Nasal washings and  aspirates are unacceptable for Xpert Xpress SARS-CoV-2/FLU/RSV  testing.  Fact Sheet for Patients: PinkCheek.be  Fact Sheet for Healthcare Providers: GravelBags.it  This test is not yet approved or cleared by the [place] FDA and  has been authorized for detection and/or diagnosis of SARS-CoV-2 by  FDA under an Emergency Use Authorization (EUA). This EUA will remain  in effect (meaning this test can be used) for the duration of the  Covid-19 declaration under Section  564(b)(1) of the Act, 21  U.S.C. section 360bbb-3(b)(1), unless the authorization is  terminated or revoked. Performed at [facility] [place] [facility], [address] [place], [place] [address]     Radiology Reports CT Head Wo Contrast  Result Date: 12/08/2019 CLINICAL DATA:  Mental status changes metastatic breast cancer EXAM: CT HEAD WITHOUT CONTRAST TECHNIQUE: Contiguous axial images were obtained from the base of the skull through the vertex without intravenous contrast. COMPARISON:  11/29/2019 FINDINGS: Brain: Stable age related atrophy pattern without acute intracranial hemorrhage, acute infarction, new mass lesion, midline shift, herniation, hydrocephalus, or extra-axial fluid collection. No focal mass effect or edema. Cisterns are patent. No cerebellar abnormality. Vascular: No hyperdense vessel or unexpected calcification. Skull: Similar mottled irregular lytic skull lesions most pronounced in the bifrontal areas, right parietal area, right skull base and clivus. Associated right frontal dural thickening/involvement without change. Sinuses/Orbits: No acute  finding. Other: None. IMPRESSION: No acute intracranial abnormality by noncontrast CT. Stable calvarial osseous metastases. Electronically Signed   By: Jerilynn Mages.  Shick M.D.   On: 12/08/2019 11:57   CT Head Wo Contrast  Result Date: 11/29/2019 CLINICAL DATA:  Delirium EXAM: CT HEAD WITHOUT CONTRAST TECHNIQUE: Contiguous axial images were obtained from the base of the skull through the vertex without intravenous contrast. COMPARISON:  None. FINDINGS: Brain: No acute infarct or intracranial hemorrhage. No mass lesion. No midline shift, ventriculomegaly or extra-axial fluid collection. Vascular: No hyperdense vessel or unexpected calcification. Carotid siphon atherosclerotic calcifications. Skull: Mottled, heterogenous appearance of the bifrontal, right parietal calvarium and clivus. There is a 1.6 cm right calvarial soft tissue focus with dehiscence of the bone  (2:18). Sinuses/Orbits: Normal orbits. Clear paranasal sinuses. No mastoid effusion. Other: Asymmetric prominence of the right temporalis muscle (2:12). IMPRESSION: No acute infarct or intracranial hemorrhage. Right frontal calvarial soft tissue, asymmetric prominence of the right temporalis muscle and calvarial/clivus heterogeneity is concerning for metastases. MRI head with and without contrast is recommended for further evaluation. Electronically Signed   By: Primitivo Gauze M.D.   On: 11/29/2019 16:37   CT SOFT TISSUE NECK W CONTRAST  Result Date: 12/01/2019 CLINICAL DATA:  Epiglottitis or tonsillitis suspected.  Neck pain. EXAM: CT NECK WITH CONTRAST TECHNIQUE: Multidetector CT imaging of the neck was performed using the standard protocol following the bolus administration of intravenous contrast. CONTRAST:  44m OMNIPAQUE IOHEXOL 350 MG/ML SOLN COMPARISON:  11/29/2019 CT and MRI head.  Concurrent CTA chest. FINDINGS: Pharynx and larynx: Clear nasopharynx. Normal appearance of the epiglottis. Partial effacement of the left vallecula and piriform sinus, likely secondary to neck positioning. The vallecula and piriform sinuses are otherwise unremarkable. No vocal cord paralysis. Small retropharyngeal effusion spanning the C2-C5 levels. Salivary glands: No inflammation, mass, or stone. Thyroid: Normal. Lymph nodes: Diffusely prominent subcentimeter cervical nodes. Vascular: Normal intravascular enhancement. Limited intracranial: Please see recent CT and MRI head. Visualized orbits: Normal orbits. Mastoids and visualized paranasal sinuses: Pneumatized. Skeleton: Diffuse heterogeneity of the bone marrow predominantly involving the cervical spine at the C1-2 and C6-T1 levels. There is multilevel involvement of the posterior elements. Vertebral body heights are preserved. Perivertebral inflammatory changes most prominent at the C2 level. Please see recent CT and MRI head for better evaluation of dominant  clival and multifocal calvarial osseous metastases. Redemonstration of anterior left TMJ metastasis. Upper chest: Partially imaged pulmonary metastases. Please see concurrent CTA chest for additional findings below the thoracic inlet. Other: None. IMPRESSION: No evidence of epiglottitis or tonsillitis. Diffuse osseous metastases with prominent involvement of the cervical spine. Vertebral body heights are preserved however cannot exclude pending pathologic fracture. Perivertebral inflammatory changes most prominent at the atlantoaxial junction with small retropharyngeal effusion. Cannot exclude metastatic soft tissue involvement. MRI cervical spine is recommended for better evaluation. Partially imaged pulmonary metastases. Please see concurrent CTA chest for additional findings below the thoracic inlet. These results will be called to the ordering clinician or representative by the Radiologist Assistant, and communication documented in the PACS or [employer] Electronically Signed   By: CPrimitivo GauzeM.D.   On: 12/01/2019 19:30   CT Chest W Contrast  Result Date: 11/13/2019 CLINICAL DATA:  Breast cancer, recent weight loss and decreased appetite. Liver lesions on recent CT abdomen pelvis. EXAM: CT CHEST WITH CONTRAST TECHNIQUE: Multidetector CT imaging of the chest was performed during intravenous contrast administration. CONTRAST:  775mOMNIPAQUE IOHEXOL 300 MG/ML  SOLN COMPARISON:  CT abdomen pelvis  11/02/2019, MR lumbar spine 10/24/2019. FINDINGS: Cardiovascular: Extensive mural thickening and severe luminal narrowing involving the left subclavian artery. Heart size normal. No pericardial effusion. Mediastinum/Nodes: Mildly hypodense left thyroid nodule measures 1.3 cm. No follow-up recommended (ref: J Am Coll Radiol. 2015 Feb;12(2): 143-50).No pathologically enlarged mediastinal lymph nodes. Left hilar lymph node measures 1.4 x 1.6 cm. Right axillary lymph nodes measure up to 8 mm. Surgical  clips in the left axilla. Soft tissue mass in the epicardial fat along the cardiac apex measures 2.0 x 2.7 cm (2/102). Esophagus is grossly unremarkable. Lungs/Pleura: Diffuse perilymphatic nodularity bilaterally. Extensive pleural/subpleural nodularity in the left hemithorax. Index subpleural nodule in the anterior left upper lobe measures 1.1 x 1.9 cm (4/50). No pleural fluid. Airway is unremarkable. Upper Abdomen: Multiple heterogeneous thick-walled lesions throughout the liver, seen in their entirety on 11/02/2019. Index lesion in the dome of the liver measures 1.9 x 2.1 cm (2/107). Visualized portion of the gallbladder is unremarkable. Slight nodular thickening of both adrenal glands. Subcentimeter low-attenuation lesion in the left kidney is likely a cyst. Visualized portions of the kidneys, spleen, pancreas, stomach and bowel are grossly unremarkable. Periportal lymph nodes measure up to 11 mm. Gastrohepatic ligament lymph nodes, 5 mm. Musculoskeletal: Mottled sclerotic metastases are seen throughout the visualized osseous structures. Healing or healed pathologic right rib fractures. Pathologic superior endplate compression fracture involving T12. Nodular soft tissue in the lateral left breast measures 1.1 x 1.7 cm (2/60). IMPRESSION: 1. Widespread pleuroparenchymal, left hilar, hepatic and osseous metastatic disease. Lateral left breast mass may be postoperative in etiology. Disease recurrence cannot be excluded. 2. Extensive mural thickening and severe luminal narrowing involving the left subclavian artery. Electronically Signed   By: Lorin Picket M.D.   On: 11/13/2019 09:54   CT ANGIO CHEST PE W OR WO CONTRAST  Result Date: 12/01/2019 CLINICAL DATA:  PE suspected, low/intermediate prob, positive D-dimer History of metastatic breast cancer. EXAM: CT ANGIOGRAPHY CHEST WITH CONTRAST TECHNIQUE: Multidetector CT imaging of the chest was performed using the standard protocol during bolus administration  of intravenous contrast. Multiplanar CT image reconstructions and MIPs were obtained to evaluate the vascular anatomy. CONTRAST:  80m OMNIPAQUE IOHEXOL 350 MG/ML SOLN COMPARISON:  Chest CT 11/12/2019, thoracic spine MRI 11/18/2019 FINDINGS: Cardiovascular: There are no filling defects within the pulmonary arteries to suggest pulmonary embolus. Left hilar lymph node is enlarging in causing increasing mass effect on lingular pulmonary artery but no evidence of invasion. Common origin of brachiocephalic and left common carotid artery, variant arch anatomy. Extensive mural thickening with severe luminal narrowing involving the proximal left subclavian artery is unchanged from recent exam. There is no aortic dissection or acute aortic findings. The heart is normal in size. No pericardial effusion. Mediastinum/Nodes: Left hilar lymph node has slightly increased in size currently measuring 18 x 15 mm, previously 16 x 14 mm. Soft tissue mass in the epicardial fat along the cardiac apex measures 3.3 x 2.1 cm (series 5, image 68), previously 2.7 x 2.0 cm. No esophageal wall thickening. Previous left thyroid nodule is not well-defined on the current exam due to streak artifact in the right subclavian vein from dense IV contrast. Surgical clips in the left axilla. Previous small right axillary nodes are partially obscured by dense IV contrast. Lungs/Pleura: Extensive bilateral pulmonary nodules are again seen, both random and pleural/subpleural. Majority of these are stable from recent prior, however largest nodule currently measures 2.2 x 1.2 cm, series 7, image 51, previously 1.9 x 1.1 cm. A small  right pleural effusion is new, with adjacent compressive atelectasis. No septal thickening or findings of pulmonary edema. Upper Abdomen: Known liver lesions on prior exam are less well-defined given phase of IV contrast. Musculoskeletal: Diffuse osseous metastatic disease throughout the thoracic osseous structures lateral left  breast soft tissue nodularity measures 15 x 12 mm, series 5, image 49. With lytic and blastic osseous lesions. Callus formation about right anterior ribs fractures, also seen on previous. Review of the MIP images confirms the above findings. IMPRESSION: 1. No pulmonary embolus. 2. New small right pleural effusion with adjacent compressive atelectasis. 3. Extensive bilateral pulmonary nodules consistent with metastatic disease. Majority of these are stable from recent prior, however largest pulmonary nodule has increased in size from prior. 4. Left hilar lymph node has slightly increased in size from recent prior. Soft tissue mass in the epicardial fat along the left cardiac apex has increased in size. 5. Diffuse osseous metastatic disease. 6. Left breast soft tissue nodularity laterally again seen. 7. Known liver lesions on prior exam are less well-defined on the current exam given phase of IV contrast. 8. Extensive mural thickening with severe luminal narrowing involving the proximal left subclavian artery is unchanged from recent prior. Electronically Signed   By: Keith Rake M.D.   On: 12/01/2019 19:20   MR Brain W and Wo Contrast  Result Date: 11/29/2019 CLINICAL DATA:  Dizziness, nonspecific.  Brain mass or lesion. EXAM: MRI HEAD WITHOUT AND WITH CONTRAST TECHNIQUE: Multiplanar, multiecho pulse sequences of the brain and surrounding structures were obtained without and with intravenous contrast. CONTRAST:  72m GADAVIST GADOBUTROL 1 MMOL/ML IV SOLN COMPARISON:  11/29/2019 head CT. FINDINGS: Brain: No acute infarct. No acute intracranial hemorrhage. No midline shift, ventriculomegaly or extra-axial fluid collection. Diffuse dural thickening overlying the right cerebral convexity. Focal dural thickening overlying the left parietal lobe (18:117). 1.5 x 0.5 cm right dural-based lesion overlying the temporal convexity (18:67). There is also dural thickening along the clivus. Vascular: Preserved major  intracranial flow voids. Skull and upper cervical spine: Multifocal T2 hyperintense enhancing calvarial lesions reflect osseous metastases. Enhancing 1.8 cm right frontal calvarial soft tissue (15:35) with dehiscence of the overlying cortex. 2.6 x 1.8 cm clival metastasis (15:10). Smaller enhancing osseous lesions are also seen involving the bilateral sphenoid wings and occipital condyles. Sinuses/Orbits: Normal orbits clear paranasal sinuses. Trace right mastoid effusion. Other: 1.4 x 0.8 cm enhancing soft tissue involving the left TMJ (18:40). Enhancing soft tissue infiltration involving the right temporalis muscle which is asymmetrically thickened (18:94). IMPRESSION: Dural-based metastatic disease demonstrating right predominance. Focal plaque-like dural lesions measuring up to 1.5 x 0.5 cm overlie the right temporal and left parietal convexities. Multifocal osseous metastases with dominant lesions involving the clivus, right frontal calvarium and left TMJ. Metastatic involvement of the right temporalis muscle. Electronically Signed   By: CPrimitivo GauzeM.D.   On: 11/29/2019 19:25   MR CERVICAL SPINE W WO CONTRAST  Result Date: 12/02/2019 CLINICAL DATA:  Metastatic breast cancer with possible pathologic fracture in the cervical spine. EXAM: MRI CERVICAL SPINE WITHOUT AND WITH CONTRAST TECHNIQUE: Multiplanar and multiecho pulse sequences of the cervical spine, to include the craniocervical junction and cervicothoracic junction, were obtained without and with intravenous contrast. CONTRAST:  565mGADAVIST GADOBUTROL 1 MMOL/ML IV SOLN COMPARISON:  Neck CT 12/01/2019. Brain MRI 11/29/2019. Thoracic spine MRI 11/18/2019. FINDINGS: Alignment: Minimal retrolisthesis of C5 on C6 and C6 on C7. Vertebrae: Diffusely abnormal bone marrow signal throughout the cervical spine, included upper thoracic spine,  and skull base consistent with known widespread osseous metastatic disease. Thin curvilinear region of low  signal through the base of the dens suggestive of a nondisplaced fracture with possible extension more laterally in the C2 vertebral body. Extra osseous tumor extension about the C2 anterior and posterior elements. Cord: Normal signal. Posterior Fossa, vertebral arteries, paraspinal tissues: Preserved vertebral artery flow voids. Small prevertebral effusion. Previously described clival metastasis with overlying dural thickening extending into the upper cervical spine. No masslike cervical epidural tumor. Disc levels: Cervical disc degeneration greatest at C5-6 where a broad-based posterior disc osteophyte complex and infolding of the ligamentum flavum result in mild-to-moderate spinal stenosis and mild-to-moderate right and severe left neural foraminal stenosis. Disc bulging and mild spurring at C6-7 resulting in moderate right and mild left neural foraminal stenosis without significant spinal stenosis. IMPRESSION: 1. Widespread osseous metastatic disease. 2. Suspected nondisplaced pathologic fracture involving the C2 vertebral body and base of dens. 3. 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 No masslike cervical dural thickening/epidural tumor. 4. Cervical disc degeneration, worst at C5-6 where there is mild-to-moderate spinal stenosis and severe left neural foraminal stenosis. These results will be called to the ordering clinician or representative by the Radiologist Assistant, and communication documented in the PACS or [employer]. Electronically Signed   By: Logan Bores M.D.   On: 12/02/2019 17:01   MR Thoracic Spine W Wo Contrast  Result Date: 11/18/2019 CLINICAL DATA:  Breast cancer EXAM: MRI THORACIC WITHOUT AND WITH CONTRAST TECHNIQUE: Multiplanar and multiecho pulse sequences of the thoracic spine were obtained without and with intravenous contrast. CONTRAST:  43m GADAVIST GADOBUTROL 1 MMOL/ML IV SOLN COMPARISON:   10/24/2019 lumbar spine MRI FINDINGS: Cervical Localizer sequence is motion degraded. Therefore counting may not be accurate. Alignment: Trace retrolisthesis at T10-T11. Vertebrae: Heterogeneous STIR hyperintensity and enhancement throughout the thoracic spine. Mild compression deformity of T12 is unchanged since prior lumbar MRI. There is additional endplate irregularity and mild loss of height at T9 and T10. There is no significant epidural extension. Cord:  No abnormal signal.  No abnormal intrathecal enhancement. Paraspinal and other soft tissues: Extra-spinal metastatic disease is partially imaged and present on prior cross-sectional imaging. Disc levels: Mild multilevel degenerative disc disease and facet arthropathy. There is no high-grade degenerative stenosis. IMPRESSION: Diffuse osseous metastatic disease. No significant epidural extension. No definite acute compression deformity. Electronically Signed   By: PMacy MisM.D.   On: 11/18/2019 14:55   DG Chest Portable 1 View  Result Date: 12/08/2019 CLINICAL DATA:  Unresponsive EXAM: PORTABLE CHEST 1 VIEW COMPARISON:  Portable exam 0949 hours compared to 11/29/2019 FINDINGS: Normal heart size, mediastinal contours, and pulmonary vascularity. Central peribronchial thickening. Questionable nodularity at lateral LEFT upper and mid thorax unchanged from CT. Interstitial prominence in LEFT greater than RIGHT lungs. No segmental consolidation, pleural effusion, or pneumothorax. Gaseous distention of stomach. No acute infiltrate, pleural effusion, or pneumothorax. Osseous demineralization. IMPRESSION: Diffuse interstitial prominence greater on LEFT with persistent nodularity at the lateral aspect of the LEFT apex corresponding to nodularity on CT. No new abnormalities identified. Electronically Signed   By: MLavonia DanaM.D.   On: 12/08/2019 10:31   DG Chest Port 1 View  Result Date: 11/29/2019 CLINICAL DATA:  Possible sepsis, metastatic breast cancer  EXAM: PORTABLE CHEST 1 VIEW COMPARISON:  05/07/2019 FINDINGS: Increased interstitial prominence with patchy density. No pleural effusion or pneumothorax. Cardiomediastinal contours are within normal limits with normal heart size. IMPRESSION:  Interstitial prominence and patchy density, at least a component of which likely reflects metastatic disease seen on recent chest CT. There may be superimposed edema or atypical infection. Electronically Signed   By: Macy Mis M.D.   On: 11/29/2019 14:45   ECHOCARDIOGRAM COMPLETE  Result Date: 12/02/2019    ECHOCARDIOGRAM REPORT   Patient Name:   KASHARA BLOCHER Date of Exam: 12/02/2019 Medical Rec #:  [id number]        Height:       63.0 in Accession #:    [id number]       Weight:       114.2 lb Date of Birth:  July 05, 1960       BSA:          1.524 m Patient Age:    89 years         BP:           131/68 mmHg Patient Gender: F                HR:           99 bpm. Exam Location:  [facility] Procedure: 2D Echo, Cardiac Doppler and Color Doppler Indications:    R94.31 Abnormal EKG; R00.0 Tachycardia  History:        Patient has no prior history of Echocardiogram examinations.                 Abnormal ECG. Breast cancer. Chemotherapy.  Sonographer:    Roseanna Rainbow RDCS (AE) Referring Phys: [phone] [facility]  Sonographer Comments: Technically difficult study due to poor echo windows. Rib artifact. Attempted to turn. Images off-axis. IMPRESSIONS  1. Left ventricular ejection fraction, by estimation, is 60 to 65%. The left ventricle has normal function. The left ventricle has no regional wall motion abnormalities. Left ventricular diastolic parameters are consistent with Grade I diastolic dysfunction (impaired relaxation).  2. Right ventricular systolic function is normal. The right ventricular size is normal.  3. The mitral valve is normal in structure. No evidence of mitral valve regurgitation. No evidence of mitral stenosis.  4. The aortic valve is tricuspid. Aortic valve  regurgitation is not visualized. No aortic stenosis is present.  5. The inferior vena cava is normal in size with greater than 50% respiratory variability, suggesting right atrial pressure of 3 mmHg. FINDINGS  Left Ventricle: Left ventricular ejection fraction, by estimation, is 60 to 65%. The left ventricle has normal function. The left ventricle has no regional wall motion abnormalities. The left ventricular internal cavity size was normal in size. There is  no left ventricular hypertrophy. Left ventricular diastolic parameters are consistent with Grade I diastolic dysfunction (impaired relaxation). Normal left ventricular filling pressure. Right Ventricle: The right ventricular size is normal. No increase in right ventricular wall thickness. Right ventricular systolic function is normal. Left Atrium: Left atrial size was normal in size. Right Atrium: Right atrial size was normal in size. Pericardium: There is no evidence of pericardial effusion. Mitral Valve: The mitral valve is normal in structure. No evidence of mitral valve regurgitation. No evidence of mitral valve stenosis. Tricuspid Valve: The tricuspid valve is normal in structure. Tricuspid valve regurgitation is not demonstrated. No evidence of tricuspid stenosis. Aortic Valve: The aortic valve is tricuspid. Aortic valve regurgitation is not visualized. No aortic stenosis is present. Aortic valve mean gradient measures 3.1 mmHg. Aortic valve peak gradient measures 6.4 mmHg. Aortic valve area, by VTI measures 1.83 cm. Pulmonic Valve: The pulmonic valve was not  well visualized. Pulmonic valve regurgitation is not visualized. No evidence of pulmonic stenosis. Aorta: The aortic root is normal in size and structure. Pulmonary Artery: Indeterminant PASP, inadequate TR jet. Venous: The inferior vena cava is normal in size with greater than 50% respiratory variability, suggesting right atrial pressure of 3 mmHg. IAS/Shunts: No atrial level shunt detected by  color flow Doppler.  LEFT VENTRICLE PLAX 2D LVIDd:         3.36 cm     Diastology LVIDs:         2.16 cm     LV e' medial:   6.96 cm/s LV PW:         1.03 cm     LV E/e' medial: 12.4 LV IVS:        0.94 cm LVOT diam:     1.70 cm LV SV:         38 LV SV Index:   25 LVOT Area:     2.27 cm  LV Volumes (MOD) LV vol d, MOD A2C: 55.0 ml LV vol d, MOD A4C: 32.5 ml LV vol s, MOD A2C: 18.4 ml LV vol s, MOD A4C: 11.3 ml LV SV MOD A2C:     36.6 ml LV SV MOD A4C:     32.5 ml LV SV MOD BP:      32.0 ml RIGHT VENTRICLE RV S prime:     11.30 cm/s TAPSE (M-mode): 1.5 cm LEFT ATRIUM           Index      RIGHT ATRIUM          Index LA diam:      2.20 cm 1.44 cm/m RA Area:     7.17 cm LA Vol (A4C): 14.0 ml 9.19 ml/m RA Volume:   12.20 ml 8.01 ml/m  AORTIC VALVE AV Area (Vmax):    1.81 cm AV Area (Vmean):   1.59 cm AV Area (VTI):     1.83 cm AV Vmax:           126.43 cm/s AV Vmean:          81.029 cm/s AV VTI:            0.208 m AV Peak Grad:      6.4 mmHg AV Mean Grad:      3.1 mmHg LVOT Vmax:         101.00 cm/s LVOT Vmean:        56.600 cm/s LVOT VTI:          0.168 m LVOT/AV VTI ratio: 0.81  AORTA Ao Root diam: 2.80 cm Ao Asc diam:  3.20 cm MITRAL VALVE MV Area (PHT): 4.21 cm    SHUNTS MV Decel Time: 180 msec    Systemic VTI:  0.17 m MV E velocity: 86.50 cm/s  Systemic Diam: 1.70 cm MV A velocity: 90.80 cm/s MV E/A ratio:  0.95 Carlyle Dolly MD Electronically signed by Carlyle Dolly MD Signature Date/Time: 12/02/2019/3:28:22 PM    Final    [place] CORE BIOPSY (LIVER)  Result Date: 11/19/2019 INDICATION: History of breast carcinoma with new multiple liver lesions, lung nodules and bone lesions consistent with recurrent metastatic disease. Biopsy of a liver lesion has been requested to confirm metastatic disease and perform molecular prognostic studies. EXAM: ULTRASOUND GUIDED CORE BIOPSY OF LIVER MEDICATIONS: None. ANESTHESIA/SEDATION: Fentanyl 100 mcg IV; Versed 2.0 mg IV Moderate Sedation Time:  16 minutes. The patient  was continuously monitored during the procedure by the interventional radiology  nurse under my direct supervision. PROCEDURE: The procedure, risks, benefits, and alternatives were explained to the patient. Questions regarding the procedure were encouraged and answered. The patient understands and consents to the procedure. A time-out was performed prior to initiating the procedure. The abdominal wall was prepped with chlorhexidine in a sterile fashion, and a sterile drape was applied covering the operative field. A sterile gown and sterile gloves were used for the procedure. Local anesthesia was provided with 1% Lidocaine. Ultrasound was used to localize liver lesions. After choosing a lesion in the right lobe of the liver, a 17 gauge needle was advanced into the liver to the margin of the lesion. Three separate coaxial 18 gauge core biopsy samples were obtained through the lesion. Core biopsy samples were submitted in formalin. Gel-Foam pledgets were advanced through the outer needle as the needle was retracted and removed. Additional ultrasound was performed. COMPLICATIONS: None immediate. FINDINGS: There are multiple hypoechoic solid round and oval masses scattered throughout the liver parenchyma. The largest localized by ultrasound measures approximately 2 cm in diameter and lies within the anterior aspect of the right lobe near the gallbladder fossa. This lesion was sampled yielding solid tissue. IMPRESSION: Ultrasound-guided core biopsy performed of a 2 cm lesion within the right lobe of the liver. Electronically Signed   By: Aletta Edouard M.D.   On: 11/19/2019 15:42     CBC Recent Labs  Lab 12/03/19 0420 12/04/19 0920 12/05/19 0502 12/08/19 1131 12/09/19 0446  WBC 8.7 8.3 8.8 9.9 8.3  HGB 9.1* 8.5* 8.5* 8.4* 7.8*  HCT 29.8* 28.0* 28.3* 27.5* 26.5*  PLT 329 312 296 304 280  MCV 83.9 84.6 84.2 84.9 87.2  MCH 25.6* 25.7* 25.3* 25.9* 25.7*  MCHC 30.5 30.4 30.0 30.5 29.4*  RDW 19.7* 19.9* 19.9*  20.4* 20.8*  LYMPHSABS  --   --   --  2.3  --   MONOABS  --   --   --  1.1*  --   EOSABS  --   --   --  0.1  --   BASOSABS  --   --   --  0.1  --     Chemistries  Recent Labs  Lab 12/03/19 0420 12/04/19 0920 12/05/19 0502 12/08/19 1131 12/09/19 0446  NA 134* 133* 136 137 137  K 3.6 3.4* 4.0 4.1 3.6  CL 105 103 107 107 106  CO2 20* 20* 19* 19* 23  GLUCOSE 117* 135* 85 120* 150*  BUN _0 CREATININE 0.78 0.89 0.62 1.25* 0.74  CALCIUM 8.1* 8.1* 8.1* 9.4 8.9  MG  --   --  1.8  --   --   AST  --  90* 117* 73*  --   ALT  --  31 47* 39  --   ALKPHOS  --  175* 184* 206*  --   BILITOT  --  0.3 0.6 0.8  --    ------------------------------------------------------------------------------------------------------------------ No results for input(s): CHOL, HDL, LDLCALC, TRIG, CHOLHDL, LDLDIRECT in the last 72 hours.  Lab Results  Component Value Date   HGBA1C 6.7 (H) 11/29/2019   ------------------------------------------------------------------------------------------------------------------ No results for input(s): TSH, T4TOTAL, T3FREE, THYROIDAB in the last 72 hours.  Invalid input(s): FREET3 ------------------------------------------------------------------------------------------------------------------ No results for input(s): VITAMINB12, FOLATE, FERRITIN, TIBC, IRON, RETICCTPCT in the last 72 hours.  Coagulation profile No results for input(s): INR, PROTIME in the last 168 hours.  No results for input(s): DDIMER in the last 72 hours.  Cardiac Enzymes No  results for input(s): CKMB, TROPONINI, MYOGLOBIN in the last 168 hours.  Invalid input(s): CK ------------------------------------------------------------------------------------------------------------------ No results found for: BNP   Roxan Hockey M.D on 12/09/2019 at 5:03 PM  Go to [email or web address] - for contact info  [facility] - Office  [phone]

## 2019-12-09 NOTE — Progress Notes (Signed)
palliative NP at bedside with son and husband

## 2019-12-09 NOTE — Progress Notes (Signed)
Bladder scan recheck 489 - Dr. Joesph Fillers notified - order to rescan at 2000 and in and out if not voided by then. Will pass in report. Charge nurse aware.

## 2019-12-10 ENCOUNTER — Ambulatory Visit: Payer: Medicare Other | Admitting: General Surgery

## 2019-12-10 DIAGNOSIS — K5903 Drug induced constipation: Secondary | ICD-10-CM | POA: Diagnosis present

## 2019-12-10 DIAGNOSIS — R339 Retention of urine, unspecified: Secondary | ICD-10-CM | POA: Diagnosis present

## 2019-12-10 DIAGNOSIS — C7951 Secondary malignant neoplasm of bone: Secondary | ICD-10-CM | POA: Diagnosis present

## 2019-12-10 DIAGNOSIS — J3081 Allergic rhinitis due to animal (cat) (dog) hair and dander: Secondary | ICD-10-CM | POA: Diagnosis present

## 2019-12-10 DIAGNOSIS — C50919 Malignant neoplasm of unspecified site of unspecified female breast: Secondary | ICD-10-CM | POA: Diagnosis present

## 2019-12-10 DIAGNOSIS — E119 Type 2 diabetes mellitus without complications: Secondary | ICD-10-CM | POA: Diagnosis present

## 2019-12-10 DIAGNOSIS — N179 Acute kidney failure, unspecified: Secondary | ICD-10-CM | POA: Diagnosis present

## 2019-12-10 DIAGNOSIS — C7989 Secondary malignant neoplasm of other specified sites: Secondary | ICD-10-CM | POA: Diagnosis present

## 2019-12-10 DIAGNOSIS — E43 Unspecified severe protein-calorie malnutrition: Secondary | ICD-10-CM | POA: Insufficient documentation

## 2019-12-10 DIAGNOSIS — T451X5A Adverse effect of antineoplastic and immunosuppressive drugs, initial encounter: Secondary | ICD-10-CM | POA: Diagnosis present

## 2019-12-10 DIAGNOSIS — M8448XA Pathological fracture, other site, initial encounter for fracture: Secondary | ICD-10-CM | POA: Diagnosis present

## 2019-12-10 DIAGNOSIS — Z20822 Contact with and (suspected) exposure to covid-19: Secondary | ICD-10-CM | POA: Diagnosis present

## 2019-12-10 DIAGNOSIS — G893 Neoplasm related pain (acute) (chronic): Secondary | ICD-10-CM | POA: Diagnosis present

## 2019-12-10 DIAGNOSIS — G9341 Metabolic encephalopathy: Secondary | ICD-10-CM | POA: Diagnosis present

## 2019-12-10 DIAGNOSIS — Z7189 Other specified counseling: Secondary | ICD-10-CM | POA: Diagnosis not present

## 2019-12-10 DIAGNOSIS — G9340 Encephalopathy, unspecified: Secondary | ICD-10-CM | POA: Diagnosis present

## 2019-12-10 DIAGNOSIS — I10 Essential (primary) hypertension: Secondary | ICD-10-CM | POA: Diagnosis present

## 2019-12-10 DIAGNOSIS — C787 Secondary malignant neoplasm of liver and intrahepatic bile duct: Secondary | ICD-10-CM | POA: Diagnosis present

## 2019-12-10 DIAGNOSIS — Z515 Encounter for palliative care: Secondary | ICD-10-CM | POA: Diagnosis not present

## 2019-12-10 DIAGNOSIS — E7800 Pure hypercholesterolemia, unspecified: Secondary | ICD-10-CM | POA: Diagnosis present

## 2019-12-10 DIAGNOSIS — C7801 Secondary malignant neoplasm of right lung: Secondary | ICD-10-CM | POA: Diagnosis present

## 2019-12-10 DIAGNOSIS — G620 Drug-induced polyneuropathy: Secondary | ICD-10-CM | POA: Diagnosis present

## 2019-12-10 DIAGNOSIS — R0902 Hypoxemia: Secondary | ICD-10-CM | POA: Diagnosis present

## 2019-12-10 DIAGNOSIS — C7802 Secondary malignant neoplasm of left lung: Secondary | ICD-10-CM | POA: Diagnosis present

## 2019-12-10 DIAGNOSIS — T40601A Poisoning by unspecified narcotics, accidental (unintentional), initial encounter: Secondary | ICD-10-CM | POA: Diagnosis present

## 2019-12-10 LAB — GLUCOSE, CAPILLARY
Glucose-Capillary: 139 mg/dL — ABNORMAL HIGH (ref 70–99)
Glucose-Capillary: 148 mg/dL — ABNORMAL HIGH (ref 70–99)
Glucose-Capillary: 175 mg/dL — ABNORMAL HIGH (ref 70–99)

## 2019-12-10 MED ORDER — PHENOL 1.4 % MT LIQD
1.0000 | OROMUCOSAL | Status: DC | PRN
  Administered 2019-12-10: 1 via OROMUCOSAL
  Filled 2019-12-10: qty 177

## 2019-12-10 MED ORDER — LABETALOL HCL 5 MG/ML IV SOLN
10.0000 mg | INTRAVENOUS | Status: DC | PRN
  Filled 2019-12-10: qty 4

## 2019-12-10 MED ORDER — SALINE SPRAY 0.65 % NA SOLN
1.0000 | NASAL | Status: DC | PRN
  Filled 2019-12-10: qty 44

## 2019-12-10 MED ORDER — ENSURE ENLIVE PO LIQD
237.0000 mL | Freq: Three times a day (TID) | ORAL | Status: DC
  Administered 2019-12-10 – 2019-12-12 (×6): 237 mL via ORAL

## 2019-12-10 NOTE — Progress Notes (Signed)
Pt to room #307 via bed from ICU. Pt and family oriented to room and safety procedures. Pt requested up OOB to [facility]. Pt assisted with assist x2, voided large amount of urine without difficulty. Pt placed on telemetry, ST 110-120. IVF continue without s/s infiltration. Pt with slow responses to questions. C-collar in place. Pt only complains of pain in neck with movement.

## 2019-12-10 NOTE — Evaluation (Signed)
Physical Therapy Evaluation Patient Details Name: NEREA BORDENAVE MRN: [id number] DOB: Apr 25, 1960 Today's Date: 12/10/2019   History of Present Illness  Reneta Niehaus  is a 59 y.o. female   with past medical history of HTN, HLD,  chemotherapy-induced neuropathy, diabetes mellitus, history of Lt breast cancer visually diagnosed in 2008, status post lumpectomy with subsequent radiation therapy and chemotherapy,  with recent reoccurrence of breast malignancy with diagnosis of metastatic disease to liver, lung and osseous metastasis involving the skull, lumbar/thoracic spine osseous metastasis, with C2 fracture, recently treated for hypercalcemia with IV fluids and IV Zometa, today as well she received IV fluids and calcitonin, she was discharged to [facility] rehab on 46/07/5991 returns today after being found unresponsive presumably from accidental narcotic overdose at the [facility] staff reports pt was found unresponsive with agonal respirations  approx 26min before pt arrival to ED.  EMS reports rr 6bpm, o2 sat 77% on 2liters o2.  EMS put pt on 15liters increased to 89%, co2 27.  Pt has had periods of apnea.  HR 110 and bp 120/80.  CBG 122.  Pt responsive to sternal rub.  -Patient had Duragesic patch 100 mcg placed on 12/07/2019, also had as needed Dilaudid, Lyrica-She responded well to Narcan initially (0.4 mg Narcan administered an patient immediately woke up and started screaming),  but became more lethargic after a few hours another dose of Narcan had to be repeated    Clinical Impression  Patient demonstrates slow labored movement for sitting up at bedside requiring extended rest breaks due to fatigue, slightly unsteady on feet during ambulation, limited to walking to bathroom only due to fatigue and c/o discomfort in back of head due to wearing [place] Collar.  Patient put back to bed after therapy due to fatigue with her son present at bedside.  Patient will benefit from  continued physical therapy in hospital and recommended venue below to increase strength, balance, endurance for safe ADLs and gait.    Follow Up Recommendations SNF    Equipment Recommendations  None recommended by PT    Recommendations for Other Services       Precautions / Restrictions Precautions Precautions: Fall Precaution Comments: Pathologic C2 fracture Required Braces or Orthoses: Cervical Brace Cervical Brace: Hard collar;At all times Restrictions Weight Bearing Restrictions: No      Mobility  Bed Mobility Overal bed mobility: Needs Assistance Bed Mobility: Supine to Sit;Sit to Supine     Supine to sit: Min assist Sit to supine: Min assist;Mod assist   General bed mobility comments: slow labored movement, frequent rest breaks    Transfers Overall transfer level: Needs assistance Equipment used: Rolling walker (2 wheeled) Transfers: Sit to/from [facility] Sit to Stand: Min assist Stand pivot transfers: Min assist       General transfer comment: slow labored movement  Ambulation/Gait Ambulation/Gait assistance: Min assist;Mod assist Gait Distance (Feet): 15 Feet Assistive device: Rolling walker (2 wheeled) Gait Pattern/deviations: Decreased step length - right;Decreased step length - left;Decreased stride length Gait velocity: decreased   General Gait Details: slow labored cadence with frequent standing rest breaks mostly due to attempting to adjust hard neck brace, limited mostly due to fatigue  Stairs            Wheelchair Mobility    Modified Rankin (Stroke Patients Only)       Balance Overall balance assessment: Needs assistance Sitting-balance support: Feet supported;No upper extremity supported Sitting balance-Leahy Scale: Fair Sitting balance - Comments: fair/good seated  EOB   Standing balance support: During functional activity;Bilateral upper extremity supported Standing balance-Leahy Scale: Fair Standing  balance comment: using RW                             Pertinent Vitals/Pain Pain Assessment: Faces Faces Pain Scale: Hurts little more Pain Location: posterior neck Pain Descriptors / Indicators: Sore;Discomfort;Aching Pain Intervention(s): Limited activity within patient's tolerance;Monitored during session;Repositioned    Home Living Family/patient expects to be discharged to:: Private residence Living Arrangements: Spouse/significant other Available Help at Discharge: Family;Available PRN/intermittently Type of Home: House Home Access: Ramped entrance     Home Layout: One level Home Equipment: Walker - 2 wheels;Cane - single point      Prior Function Level of Independence: Independent with assistive device(s)         Comments: hosehold ambulator using SPC     Hand Dominance        Extremity/Trunk Assessment   Upper Extremity Assessment Upper Extremity Assessment: Generalized weakness    Lower Extremity Assessment Lower Extremity Assessment: Generalized weakness    Cervical / Trunk Assessment Cervical / Trunk Assessment: Other exceptions Cervical / Trunk Exceptions: hard neck collar at all times  Communication   Communication: No difficulties  Cognition Arousal/Alertness: Awake/alert Behavior During Therapy: WFL for tasks assessed/performed Overall Cognitive Status: Within Functional Limits for tasks assessed                                        General Comments      Exercises     Assessment/Plan    PT Assessment Patient needs continued PT services  PT Problem List Decreased strength;Decreased activity tolerance;Decreased balance;Decreased mobility       PT Treatment Interventions Balance training;Gait training;Stair training;Functional mobility training;Therapeutic activities;Therapeutic exercise;Patient/family education;DME instruction    PT Goals (Current goals can be found in the Care Plan section)  Acute  Rehab PT Goals Patient Stated Goal: return home with family to assist PT Goal Formulation: With patient/family Time For Goal Achievement: 12/24/19 Potential to Achieve Goals: Good    Frequency Min 3X/week   Barriers to discharge        Co-evaluation               AM-PAC PT "6 Clicks" Mobility  Outcome Measure Help needed turning from your back to your side while in a flat bed without using bedrails?: A Little Help needed moving from lying on your back to sitting on the side of a flat bed without using bedrails?: A Little Help needed moving to and from a bed to a chair (including a wheelchair)?: A Lot Help needed standing up from a chair using your arms (e.g., wheelchair or bedside chair)?: A Lot Help needed to walk in hospital room?: A Lot Help needed climbing 3-5 steps with a railing? : A Lot 6 Click Score: 14    End of Session   Activity Tolerance: Patient tolerated treatment well;Patient limited by fatigue Patient left: in bed;with call bell/phone within reach;with bed alarm set;with family/visitor present Nurse Communication: Mobility status PT Visit Diagnosis: Unsteadiness on feet (R26.81);Other abnormalities of gait and mobility (R26.89);Muscle weakness (generalized) (M62.81)    Time: 1458-1530 PT Time Calculation (min) (ACUTE ONLY): 32 min   Charges:   PT Evaluation $PT Eval Moderate Complexity: 1 Mod PT Treatments $Therapeutic Activity: 23-37 mins  3:58 PM, 12/10/19 Lonell Grandchild, MPT Physical Therapist with [facility] [phone] office [phone] mobile phone

## 2019-12-10 NOTE — Plan of Care (Signed)
  Problem: Acute Rehab PT Goals(only PT should resolve) Goal: Pt Will Go Supine/Side To Sit Outcome: Progressing Flowsheets (Taken 12/10/2019 1602) Pt will go Supine/Side to Sit: with min guard assist Goal: Patient Will Transfer Sit To/From Stand Outcome: Progressing Flowsheets (Taken 12/10/2019 1602) Patient will transfer sit to/from stand: with min guard assist Goal: Pt Will Transfer Bed To Chair/Chair To Bed Outcome: Progressing Flowsheets (Taken 12/10/2019 1602) Pt will Transfer Bed to Chair/Chair to Bed:  min guard assist  with min assist Goal: Pt Will Ambulate Outcome: Progressing Flowsheets (Taken 12/10/2019 1602) Pt will Ambulate:  with min guard assist  with minimal assist   4:03 PM, 12/10/19 Lonell Grandchild, MPT Physical Therapist with [facility] [phone] office [phone] mobile phone

## 2019-12-10 NOTE — Progress Notes (Signed)
Pt back up in recliner per her request. Pt keeps asking son to check her bed covers, pt says she sees bugs crawling on the bed. Bed cover checked, no bugs noted. Reassured pt that there are no bugs in her room.

## 2019-12-10 NOTE — Progress Notes (Signed)
Palliative: Krista Doyle is lying quietly in bed.  She is more alert today, able to make her needs known.  Her son, Krista Doyle from [place], is at bedside.  Present today for for our meeting is attending, Dr. Joesph Fillers.  We talk in detail about Krista Doyle's pain which she states is controlled at this point.  We did talk in detail about the plan for PT evaluation, rehab if qualified.  Krista Doyle tells [place] that she would accept rehab placement.  We talked about outpatient follow-up with oncology.  Attending leaves the conversation, and Mr. Wilkowski arrives.  We talk about CODE STATUS.  I share with Krista Doyle that we do not expect her to make any decisions at this point, instead encouraged her to consider her choices, what she wants her time to look like.  Support given to patient and family.  Conference with attending, bedside nursing staff and [facility] team related to patient condition, needs, goals of care. PMT to continue to follow.   Plan: At this point agreeable to rehab if qualified.  Follow-up with oncology outpatient.  CODE STATUS discussions held  59 minutes Quinn Axe, NP Palliative Medicine Team Team Phone # [phone] Greater than 50% of this time was spent counseling and coordinating related to the above assessment and plan.

## 2019-12-10 NOTE — NC FL2 (Signed)
[place] MEDICAID FL2 LEVEL OF CARE SCREENING TOOL     IDENTIFICATION  Patient Name: Krista Doyle Birthdate: May 31, 1960 Sex: female Admission Date (Current Location): 12/08/2019  [facility] [place] Number:  [employer] and Address:  [facility] [address] [place]      Provider Number: [id number]  Attending Physician Name and Address:  Roxan Hockey, MD  Relative Name and Phone Number:       Current Level of Care: Hospital Recommended Level of Care: [place] Prior Approval Number:    Date Approved/Denied:   PASRR Number: [id number]  Discharge Plan: SNF    Current Diagnoses: Patient Active Problem List   Diagnosis Date Noted  . Protein-calorie malnutrition, severe 12/10/2019  . Cancer associated pain 12/10/2019  . Encounter for hospice care discussion   . Acute metabolic encephalopathy 26/94/8546  . Overdose opiate, accidental or unintentional, initial encounter ([place]) 12/08/2019  . Weakness   . Cancer related pain   . Palliative care by specialist   . Port-A-Cath in place 12/01/2019  . Hypercalcemia 11/29/2019  . Goals of care, counseling/discussion 11/28/2019  . 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 Lumbar facet arthropathy 10/07/2015  . 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 Constipation due to opioid therapy 05/08/2015  . Genetic testing 03/17/2015  . Osteopenia determined by x-ray 03/13/2015  . Lumbar spondylosis 02/11/2015  . Lumbar radiculopathy 02/11/2015  . Chemotherapy-induced peripheral neuropathy ([place]) 02/11/2015  . Vitamin D deficiency 02/02/2015  . Stage IV Breast cancer of upper-outer quadrant of left female breast ([place]) 12/22/2014  . High risk medication use 12/22/2014    Orientation RESPIRATION BLADDER Height & Weight     Self, Time, Situation, Place  Normal Continent Weight: 114 lb 3.2 oz (51.8 kg) Height:  5\' 3"  (160 cm)  BEHAVIORAL  SYMPTOMS/MOOD NEUROLOGICAL BOWEL NUTRITION STATUS      Continent Diet (see dc summary)  AMBULATORY STATUS COMMUNICATION OF NEEDS Skin   Extensive Assist Verbally Normal                       Personal Care Assistance Level of Assistance    Bathing Assistance: Limited assistance Feeding assistance: Independent Dressing Assistance: Limited assistance     Functional Limitations Info    Sight Info: Adequate Hearing Info: Adequate Speech Info: Adequate    SPECIAL CARE FACTORS FREQUENCY        PT Frequency: 5x week OT Frequency: 3x week            Contractures Contractures Info: Not present    Additional Factors Info    Code Status Info: full Allergies Info: Pollen Extract, cat/dog dander           Current Medications (12/10/2019):  This is the current hospital active medication list Current Facility-Administered Medications  Medication Dose Route Frequency Provider Last Rate Last Admin  . 0.9 %  sodium chloride infusion  250 mL Intravenous PRN Emokpae, Courage, MD      . acetaminophen (TYLENOL) tablet 650 mg  650 mg Oral Q6H PRN Emokpae, Courage, MD       Or  . acetaminophen (TYLENOL) suppository 650 mg  650 mg Rectal Q6H PRN Emokpae, Courage, MD      . amitriptyline (ELAVIL) tablet 10 mg  10 mg Oral QHS Emokpae, Courage, MD   10 mg at 12/09/19 2149  . atorvastatin (LIPITOR) tablet 20 mg  20 mg Oral QHS Emokpae,  Courage, MD   20 mg at 12/09/19 2149  . bisacodyl (DULCOLAX) suppository 10 mg  10 mg Rectal Daily PRN Emokpae, Courage, MD      . Chlorhexidine Gluconate Cloth 2 % PADS 6 each  6 each Topical Daily Roxan Hockey, MD   6 each at 12/09/19 1212  . dextrose 5 %-0.45 % sodium chloride infusion   Intravenous Continuous D[place] Brick, Courage, MD 50 mL/hr at 12/10/19 1323 Rate Change at 12/10/19 1323  . feeding supplement (ENSURE ENLIVE / ENSURE PLUS) liquid 237 mL  237 mL Oral TID BM Emokpae, Courage, MD   237 mL at 12/10/19 1226  . fentaNYL (DURAGESIC) 50 MCG/HR 1  patch  1 patch Transdermal Q72H Emokpae, Courage, MD   1 patch at 12/08/19 2205  . heparin injection 5,000 Units  5,000 Units Subcutaneous Q8H Roxan Hockey, MD   5,000 Units at 12/10/19 0644  . insulin aspart (novoLOG) injection 0-5 Units  0-5 Units Subcutaneous QHS Emokpae, Courage, MD      . insulin aspart (novoLOG) injection 0-6 Units  0-6 Units Subcutaneous TID WC Emokpae, Courage, MD   1 Units at 12/09/19 1212  . labetalol (NORMODYNE) injection 10 mg  10 mg Intravenous Q4H PRN Emokpae, Courage, MD      . lactulose (CHRONULAC) 10 GM/15ML solution 20 g  20 g Oral Daily Emokpae, Courage, MD   20 g at 12/10/19 1214  . letrozole [facility]) tablet 2.5 mg  2.5 mg Oral Daily Emokpae, Courage, MD   2.5 mg at 12/10/19 1213  . lidocaine-prilocaine (EMLA) cream   Topical Once Emokpae, Courage, MD      . LORazepam (ATIVAN) injection 0.5 mg  0.5 mg Intravenous Q6H PRN Emokpae, Courage, MD      . megestrol (MEGACE) tablet 20 mg  20 mg Oral BID Emokpae, Courage, MD   20 mg at 12/10/19 1213  . metoprolol tartrate (LOPRESSOR) tablet 50 mg  50 mg Oral BID D[place] Brick, Courage, MD   50 mg at 12/10/19 1213  . mometasone-formoterol (DULERA) 200-5 MCG/ACT inhaler 2 puff  2 puff Inhalation BID Roxan Hockey, MD   2 puff at 12/09/19 2036  . naloxone [facility]) injection 0.4 mg  0.4 mg Intravenous Q4H PRN Denton Brick, Courage, MD   0.4 mg at 12/08/19 1347  . ondansetron (ZOFRAN) tablet 4 mg  4 mg Oral Q6H PRN Emokpae, Courage, MD       Or  . ondansetron (ZOFRAN) injection 4 mg  4 mg Intravenous Q6H PRN Emokpae, Courage, MD      . oxyCODONE (Oxy IR/ROXICODONE) immediate release tablet 5 mg  5 mg Oral Q4H PRN Emokpae, Courage, MD      . polyethylene glycol (MIRALAX / GLYCOLAX) packet 17 g  17 g Oral Daily Emokpae, Courage, MD   17 g at 12/10/19 1214  . senna-docusate (Senokot-S) tablet 2 tablet  2 tablet Oral BID Roxan Hockey, MD   2 tablet at 12/10/19 1214  . sodium chloride (OCEAN) 0.65 % nasal spray 1 spray  1 spray  Each Nare PRN Emokpae, Courage, MD      . sodium chloride flush (NS) 0.9 % injection 3 mL  3 mL Intravenous Q12H Emokpae, Courage, MD   3 mL at 12/09/19 2151  . sodium chloride flush (NS) 0.9 % injection 3 mL  3 mL Intravenous Q12H Emokpae, Courage, MD   3 mL at 12/10/19 1215  . sodium chloride flush (NS) 0.9 % injection 3 mL  3 mL Intravenous PRN Roxan Hockey, MD      .  traZODone (DESYREL) tablet 50 mg  50 mg Oral QHS PRN Roxan Hockey, MD         Discharge Medications: Please see discharge summary for a list of discharge medications.  Relevant Imaging Results:  Relevant Lab Results:   Additional Information SSN: [id number] [place]

## 2019-12-10 NOTE — Progress Notes (Signed)
Pt back in bed after using BSC to void. Pt's heart rate noted to be 130 bpm while ambulating and on BSC, but HR now 120-121, which is patient baseline since admission to this unit. Pt with no SOB, no c/o CP. Only c/o is regarding c-collar rubbing on the back of her scalp. Area padded with guaze for comfort. MD Courage notified of pt up on [facility] and current VS. MD and Palliative care both into room to discuss plan of care with pt, son and husband.

## 2019-12-10 NOTE — Progress Notes (Addendum)
Patient Demographics:    Krista Doyle, is a 60 y.o. female, DOB - October 07, 1960, AYT:[id number]  Admit date - 12/08/2019   Admitting Physician Krista Archuleta Denton Brick, MD  Outpatient Primary MD for the patient is Krista Fire, MD  LOS - 0   Chief Complaint  Patient presents with  . unresponsive        Subjective:    Krista Doyle today has no fevers, no emesis,  No chest pain,   -Husband and son at bedside, patient is more awake and more coherent Voiding better -Patient is still not eating much -Pain control improving --Patient seen with palliative care provider Krista Doyle at bedside -Weakness and deconditioning persist patient needs help with ADLs patient will most likely need SNF rehab prior to returning home  Assessment  & Plan :    Principal Problem:   Overdose opiate, accidental or unintentional, initial encounter [facility]) Active Problems:   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 female  with past medical history of HTN, HLD,  chemotherapy-induced neuropathy, diabetes mellitus, history of Lt breast cancer visually diagnosed in 2008, status post lumpectomy with subsequent radiation therapy and chemotherapy, with recent reoccurrence of breast malignancy with diagnosis of metastatic disease to liver, lung and osseous metastasis involving the skull, lumbar/thoracic spine osseous metastasis, with C2 fracture, recently treated for hypercalcemia, admitted on 12/08/19 after accidental opiate overdose at [facility]   A/p 1)Accidental Opiate Overdose--- responded well to  Narcan x 2 doses,  --- Patient with significant metastatic breast cancer related pain with lots of bony metastases as well as lung and liver/abdominal metastases -We need to find a balance between adequate pain control while avoiding respiratory depression and oversedation -PTA patient was on Duragesic patch 100 mcg every 72 hours, will decrease to 50 mcg daily every 72 hours -Stopped p.o. Dilaudid, stopped methocarbamol and stopped Lyrica to avoid oversedation -Decreased Elavil to 10 mg nightly -May use as needed oxycodone for breakthrough pain -Okay to use Narcan as needed oversedation/respiratory distress --Pain control is improving  2)Stage IV Metastatic breast cancer with mets to the bone liver and lungs--- with significant underlying pain from same- metastatic disease to liver, lung and osseous metastasis involving the skull, lumbar/thoracic spine osseous metastasis, with C2 fracture, -Patient also has chemotherapy-induced peripheral neuropathy -Pain management as above #1 -Continue Femara for now -Discussed with pt's Oncologist Dr Toney Rakes current functional status does Not allow for palliative chemoRx at this time radiation -Patient to have outpatient oncology evaluation after she gets out of rehab if her functional status improves -Per patient's oncologist MRI brain findings are more likely related to skull/bony metastases, rather than leptomeningeal or brain mets -Patient was recently treated for hypercalcemia--continue to monitor calcium levels -Palliative care consult appreciated -Pain control improving  3)Acute metabolic encephalopathy--- secondary to #1 #2 above, Resolved  4)Moderate Protein caloric malnutrition in the setting of underlying malignancy---  continue Megace for appetite stimulation -c/n nutritional supplements  5)Social/Ethics---  plan of care, poor prognosis, advanced directives and goals of care discussed with patient son Mr Krista Doyle and  patient's daughter-in-law Ms Krista Doyle as well as pt's Husband -Pt  is currently a full code with full scope of treatment -Palliative care consult noted  6)DM2-hold scheduled diabetic medications until patient is more awake and oral intake is more reliable  --use Novolog/Humalog Sliding scale insulin with Accu-Cheks/Fingersticks as ordered   7) acute on chronic anemia of malignancy--- hemoglobin 7.8  - no evidence of ongoing bleeding -Suspect drop in H&H is partly due to hemodilution  8)AKI----acute kidney injury due to dehydration in the setting of poor oral intake and unresponsive episode    creatinine on admission= 1.25 , baseline creatinine = 0.6 on 12/05/2019   ,  renally adjust medications, avoid nephrotoxic agents / dehydration  / hypotension AKI has resolved with Hydration, Creatine has Normalized -Hydrate IV gently until oral intake is more reliable  9)Urinary Retention-- Requiring in and out catherization---suspect opiate related -Overall much improved  10) generalized weakness and deconditioning--- PT eval pending  --Weakness and deconditioning persist patient needs help with ADLs patient will most likely need SNF rehab prior to returning home  11) C2 fracture pathologic--continue neck collar use, pain control as above #1  Disposition/Need for in-Hospital Stay- patient unable to be discharged at this time due to --accidental narcotic overdose, needs for adequate pain control/adjustment of pain medications as well as need for IV fluids for AKI until acute metabolic encephalopathy improves enough and oral intake is more reliable   Disposition: The patient is from: SNF [facility]  Anticipated d/c is to: SNF  Anticipated d/c date is: 1 day  Patient currently is not medically stable to d/c. Barriers: Not Clinically Stable- accidental narcotic overdose, needs for adequate pain control/adjustment of pain medications as well as need for IV  fluids for AKI until acute metabolic encephalopathy improves enough and oral intake is more reliable  Code Status : full  Family Communication:   discussed with patient son Mr Krista Doyle and patient's daughter-in-law Ms Krista Doyle as well as pt's Husband Consults  :  palliative  DVT Prophylaxis  : - Heparin - SCDs    Lab Results  Component Value Date   PLT 280 12/09/2019    Inpatient Medications  Scheduled Meds: . amitriptyline  10 mg Oral QHS  . atorvastatin  20 mg Oral QHS  . Chlorhexidine Gluconate Cloth  6 each Topical Daily  . feeding supplement  237 mL Oral TID BM  . fentaNYL  1 patch Transdermal Q72H  . heparin  5,000 Units Subcutaneous Q8H  . insulin aspart  0-5 Units Subcutaneous QHS  . insulin aspart  0-6 Units Subcutaneous TID WC  . lactulose  20 g Oral Daily  . letrozole  2.5 mg Oral Daily  . lidocaine-prilocaine   Topical Once  . megestrol  20 mg Oral BID  . metoprolol tartrate  50 mg Oral BID  . mometasone-formoterol  2 puff Inhalation BID  . polyethylene glycol  17 g Oral Daily  . senna-docusate  2 tablet Oral BID  . sodium chloride flush  3 mL Intravenous Q12H  . sodium chloride flush  3 mL Intravenous Q12H   Continuous Infusions: . sodium chloride    . dextrose 5 % and 0.45% NaCl 125 mL/hr at 12/10/19 1034   PRN Meds:.sodium chloride, acetaminophen **OR** acetaminophen, bisacodyl, LORazepam, naLOXone (NARCAN)  injection, ondansetron **OR** ondansetron (ZOFRAN) IV, oxyCODONE, sodium chloride, sodium chloride flush, traZODone  Anti-infectives (From admission, onward)   None        Objective:   Vitals:   12/10/19 0600 12/10/19 0700 12/10/19 0826 12/10/19 1102  BP: (!) 157/85 (!) 168/92 (!) 163/93 (!) 163/96  Pulse: (!) 121 (!) 125 (!) 120 (!) 131  Resp: _0 Temp:   99.8 F (37.7 C) 99.4 F (37.4 C)  TempSrc:   Oral Oral  SpO2: 95% 99% 100% 100%  Weight:      Height:        Wt Readings from Last 3 Encounters:  12/08/19  51.8 kg  12/01/19 51.8 kg  11/19/19 49.4 kg    Intake/Output Summary (Last 24 hours) at 12/10/2019 1310 Last data filed at 12/10/2019 1216 Gross per 24 hour  Intake 3 ml  Output 1100 ml  Net -1097 ml     Physical Exam  Gen:- Awake Alert, more awake, chronically ill-appearing HEENT:- [place].AT, No sclera icterus Neck--neck collar in situ,.  Lungs-  CTAB , fair symmetrical air movement CV- S1, S2 normal, regular   Abd-  +ve B.Sounds, Abd Soft, No tenderness,    Extremity/Skin:- No  edema, pedal pulses present  Psych-affect is appropriate, oriented x3 Neuro-generalized weakness, no new focal deficits, no tremors   Data Review:   Micro Results Recent Results (from the past 240 hour(s))  Respiratory Panel by RT PCR (Flu A&B, Covid) - Nasopharyngeal Swab     Status: None   Collection Time: 12/05/19  3:28 PM   Specimen: Nasopharyngeal Swab  Result Value Ref Range Status   SARS Coronavirus 2 by RT PCR NEGATIVE NEGATIVE Final    Comment: (NOTE) SARS-CoV-2 target nucleic acids are NOT DETECTED.  The SARS-CoV-2 RNA is generally detectable in upper respiratoy specimens during the acute phase of infection. The lowest concentration of SARS-CoV-2 viral copies this assay can detect is 131 copies/mL. A negative result does not preclude SARS-Cov-2 infection and should not be used as the sole basis for treatment or other patient management decisions. A negative result may occur with  improper specimen collection/handling, submission of specimen other than nasopharyngeal swab, presence of viral mutation(s) within the areas targeted by this assay, and inadequate number of viral copies (<131 copies/mL). A negative result must be combined with clinical observations, patient history, and epidemiological information. The expected result is Negative.  Fact Sheet for Patients:  PinkCheek.be  Fact Sheet for Healthcare Providers:    GravelBags.it  This test is no t yet approved or cleared by the [place] FDA and  has been authorized for detection and/or diagnosis of SARS-CoV-2 by FDA under an Emergency Use Authorization (EUA). This EUA will remain  in effect (meaning this test can be used) for the duration of the COVID-19 declaration under Section 564(b)(1) of the Act, 21 U.S.C. section 360bbb-3(b)(1), unless the authorization is terminated or revoked sooner.     Influenza A by PCR NEGATIVE NEGATIVE Final   Influenza B by PCR NEGATIVE NEGATIVE Final    Comment: (NOTE) The Xpert Xpress SARS-CoV-2/FLU/RSV assay is intended as an aid in  the diagnosis of influenza from Nasopharyngeal swab specimens and  should not be used as a sole basis for treatment. Nasal washings and  aspirates are unacceptable for Xpert Xpress SARS-CoV-2/FLU/RSV  testing.  Fact Sheet for Patients: PinkCheek.be  Fact Sheet for Healthcare Providers: GravelBags.it  This test is not yet approved or cleared by the [place] FDA and  has been authorized for detection and/or diagnosis of SARS-CoV-2 by  FDA under an Emergency Use Authorization (EUA). This EUA will remain  in effect (meaning this test can be used) for the duration of the  Covid-19 declaration under Section 564(b)(1) of the Act, 21  U.S.C. section 360bbb-3(b)(1), unless the authorization is  terminated or revoked. Performed at [facility], [address], [place], [place] [address]   Respiratory Panel by RT PCR (Flu A&B, Covid) - Nasopharyngeal Swab     Status: None   Collection Time: 12/08/19 11:31 AM   Specimen: Nasopharyngeal Swab  Result Value Ref Range Status   SARS Coronavirus 2 by RT PCR NEGATIVE NEGATIVE Final    Comment: (NOTE) SARS-CoV-2 target nucleic acids are NOT DETECTED.  The SARS-CoV-2 RNA is generally detectable in upper respiratoy specimens during the acute phase  of infection. The lowest concentration of SARS-CoV-2 viral copies this assay can detect is 131 copies/mL. A negative result does not preclude SARS-Cov-2 infection and should not be used as the sole basis for treatment or other patient management decisions. A negative result may occur with  improper specimen collection/handling, submission of specimen other than nasopharyngeal swab, presence of viral mutation(s) within the areas targeted by this assay, and inadequate number of viral copies (<131 copies/mL). A negative result must be combined with clinical observations, patient history, and epidemiological information. The expected result is Negative.  Fact Sheet for Patients:  PinkCheek.be  Fact Sheet for Healthcare Providers:  GravelBags.it  This test is no t yet approved or cleared by the [place] FDA and  has been authorized for detection and/or diagnosis of SARS-CoV-2 by FDA under an Emergency Use Authorization (EUA). This EUA will remain  in effect (meaning this test can be used) for the duration of the COVID-19 declaration under Section 564(b)(1) of the Act, 21 U.S.C. section 360bbb-3(b)(1), unless the authorization is terminated or revoked sooner.     Influenza A by PCR NEGATIVE NEGATIVE Final   Influenza B by PCR NEGATIVE NEGATIVE Final    Comment: (NOTE) The Xpert Xpress SARS-CoV-2/FLU/RSV assay is intended as an aid in  the diagnosis of influenza from Nasopharyngeal swab specimens and  should not be used as a sole basis for treatment. Nasal washings and  aspirates are unacceptable for Xpert Xpress SARS-CoV-2/FLU/RSV  testing.  Fact Sheet for Patients: PinkCheek.be  Fact Sheet for Healthcare Providers: GravelBags.it  This test is not yet approved or cleared by the [place] FDA and  has been authorized for detection and/or diagnosis of  SARS-CoV-2 by  FDA under an Emergency Use Authorization (EUA). This EUA will remain  in effect (meaning this test can be used) for the duration of the  Covid-19 declaration under Section 564(b)(1) of the Act, 21  U.S.C. section 360bbb-3(b)(1), unless the authorization is  terminated or revoked. Performed at [facility], [address] [place], [place] [address]     Radiology Reports CT Head Wo Contrast  Result Date: 12/08/2019 CLINICAL DATA:  Mental status changes metastatic breast cancer EXAM: CT HEAD WITHOUT CONTRAST TECHNIQUE: Contiguous axial images were obtained from the base of the skull through the vertex without intravenous contrast. COMPARISON:  11/29/2019 FINDINGS: Brain: Stable age related atrophy pattern without acute intracranial hemorrhage, acute infarction, new mass lesion, midline shift, herniation, hydrocephalus, or extra-axial fluid collection. No focal mass effect or edema. Cisterns are patent. No cerebellar abnormality. Vascular: No hyperdense vessel or unexpected calcification. Skull: Similar mottled irregular lytic skull lesions most pronounced in the bifrontal areas, right parietal area, right skull base and clivus. Associated right  frontal dural thickening/involvement without change. Sinuses/Orbits: No acute finding. Other: None. IMPRESSION: No acute intracranial abnormality by noncontrast CT. Stable calvarial osseous metastases. Electronically Signed   By: Jerilynn Mages.  Shick M.D.   On: 12/08/2019 11:57   CT Head Wo Contrast  Result Date: 11/29/2019 CLINICAL DATA:  Delirium EXAM: CT HEAD WITHOUT CONTRAST TECHNIQUE: Contiguous axial images were obtained from the base of the skull through the vertex without intravenous contrast. COMPARISON:  None. FINDINGS: Brain: No acute infarct or intracranial hemorrhage. No mass lesion. No midline shift, ventriculomegaly or extra-axial fluid collection. Vascular: No hyperdense vessel or unexpected calcification. Carotid siphon atherosclerotic  calcifications. Skull: Mottled, heterogenous appearance of the bifrontal, right parietal calvarium and clivus. There is a 1.6 cm right calvarial soft tissue focus with dehiscence of the bone (2:18). Sinuses/Orbits: Normal orbits. Clear paranasal sinuses. No mastoid effusion. Other: Asymmetric prominence of the right temporalis muscle (2:12). IMPRESSION: No acute infarct or intracranial hemorrhage. Right frontal calvarial soft tissue, asymmetric prominence of the right temporalis muscle and calvarial/clivus heterogeneity is concerning for metastases. MRI head with and without contrast is recommended for further evaluation. Electronically Signed   By: Primitivo Gauze M.D.   On: 11/29/2019 16:37   CT SOFT TISSUE NECK W CONTRAST  Result Date: 12/01/2019 CLINICAL DATA:  Epiglottitis or tonsillitis suspected.  Neck pain. EXAM: CT NECK WITH CONTRAST TECHNIQUE: Multidetector CT imaging of the neck was performed using the standard protocol following the bolus administration of intravenous contrast. CONTRAST:  8m OMNIPAQUE IOHEXOL 350 MG/ML SOLN COMPARISON:  11/29/2019 CT and MRI head.  Concurrent CTA chest. FINDINGS: Pharynx and larynx: Clear nasopharynx. Normal appearance of the epiglottis. Partial effacement of the left vallecula and piriform sinus, likely secondary to neck positioning. The vallecula and piriform sinuses are otherwise unremarkable. No vocal cord paralysis. Small retropharyngeal effusion spanning the C2-C5 levels. Salivary glands: No inflammation, mass, or stone. Thyroid: Normal. Lymph nodes: Diffusely prominent subcentimeter cervical nodes. Vascular: Normal intravascular enhancement. Limited intracranial: Please see recent CT and MRI head. Visualized orbits: Normal orbits. Mastoids and visualized paranasal sinuses: Pneumatized. Skeleton: Diffuse heterogeneity of the bone marrow predominantly involving the cervical spine at the C1-2 and C6-T1 levels. There is multilevel involvement of the  posterior elements. Vertebral body heights are preserved. Perivertebral inflammatory changes most prominent at the C2 level. Please see recent CT and MRI head for better evaluation of dominant clival and multifocal calvarial osseous metastases. Redemonstration of anterior left TMJ metastasis. Upper chest: Partially imaged pulmonary metastases. Please see concurrent CTA chest for additional findings below the thoracic inlet. Other: None. IMPRESSION: No evidence of epiglottitis or tonsillitis. Diffuse osseous metastases with prominent involvement of the cervical spine. Vertebral body heights are preserved however cannot exclude pending pathologic fracture. Perivertebral inflammatory changes most prominent at the atlantoaxial junction with small retropharyngeal effusion. Cannot exclude metastatic soft tissue involvement. MRI cervical spine is recommended for better evaluation. Partially imaged pulmonary metastases. Please see concurrent CTA chest for additional findings below the thoracic inlet. These results will be called to the ordering clinician or representative by the Radiologist Assistant, and communication documented in the PACS or [employer] Electronically Signed   By: CPrimitivo GauzeM.D.   On: 12/01/2019 19:30   CT Chest W Contrast  Result Date: 11/13/2019 CLINICAL DATA:  Breast cancer, recent weight loss and decreased appetite. Liver lesions on recent CT abdomen pelvis. EXAM: CT CHEST WITH CONTRAST TECHNIQUE: Multidetector CT imaging of the chest was performed during intravenous contrast administration. CONTRAST:  77mOMNIPAQUE IOHEXOL 300  MG/ML  SOLN COMPARISON:  CT abdomen pelvis 11/02/2019, MR lumbar spine 10/24/2019. FINDINGS: Cardiovascular: Extensive mural thickening and severe luminal narrowing involving the left subclavian artery. Heart size normal. No pericardial effusion. Mediastinum/Nodes: Mildly hypodense left thyroid nodule measures 1.3 cm. No follow-up recommended (ref: J Am  Coll Radiol. 2015 Feb;12(2): 143-50).No pathologically enlarged mediastinal lymph nodes. Left hilar lymph node measures 1.4 x 1.6 cm. Right axillary lymph nodes measure up to 8 mm. Surgical clips in the left axilla. Soft tissue mass in the epicardial fat along the cardiac apex measures 2.0 x 2.7 cm (2/102). Esophagus is grossly unremarkable. Lungs/Pleura: Diffuse perilymphatic nodularity bilaterally. Extensive pleural/subpleural nodularity in the left hemithorax. Index subpleural nodule in the anterior left upper lobe measures 1.1 x 1.9 cm (4/50). No pleural fluid. Airway is unremarkable. Upper Abdomen: Multiple heterogeneous thick-walled lesions throughout the liver, seen in their entirety on 11/02/2019. Index lesion in the dome of the liver measures 1.9 x 2.1 cm (2/107). Visualized portion of the gallbladder is unremarkable. Slight nodular thickening of both adrenal glands. Subcentimeter low-attenuation lesion in the left kidney is likely a cyst. Visualized portions of the kidneys, spleen, pancreas, stomach and bowel are grossly unremarkable. Periportal lymph nodes measure up to 11 mm. Gastrohepatic ligament lymph nodes, 5 mm. Musculoskeletal: Mottled sclerotic metastases are seen throughout the visualized osseous structures. Healing or healed pathologic right rib fractures. Pathologic superior endplate compression fracture involving T12. Nodular soft tissue in the lateral left breast measures 1.1 x 1.7 cm (2/60). IMPRESSION: 1. Widespread pleuroparenchymal, left hilar, hepatic and osseous metastatic disease. Lateral left breast mass may be postoperative in etiology. Disease recurrence cannot be excluded. 2. Extensive mural thickening and severe luminal narrowing involving the left subclavian artery. Electronically Signed   By: Lorin Picket M.D.   On: 11/13/2019 09:54   CT ANGIO CHEST PE W OR WO CONTRAST  Result Date: 12/01/2019 CLINICAL DATA:  PE suspected, low/intermediate prob, positive D-dimer History  of metastatic breast cancer. EXAM: CT ANGIOGRAPHY CHEST WITH CONTRAST TECHNIQUE: Multidetector CT imaging of the chest was performed using the standard protocol during bolus administration of intravenous contrast. Multiplanar CT image reconstructions and MIPs were obtained to evaluate the vascular anatomy. CONTRAST:  93m OMNIPAQUE IOHEXOL 350 MG/ML SOLN COMPARISON:  Chest CT 11/12/2019, thoracic spine MRI 11/18/2019 FINDINGS: Cardiovascular: There are no filling defects within the pulmonary arteries to suggest pulmonary embolus. Left hilar lymph node is enlarging in causing increasing mass effect on lingular pulmonary artery but no evidence of invasion. Common origin of brachiocephalic and left common carotid artery, variant arch anatomy. Extensive mural thickening with severe luminal narrowing involving the proximal left subclavian artery is unchanged from recent exam. There is no aortic dissection or acute aortic findings. The heart is normal in size. No pericardial effusion. Mediastinum/Nodes: Left hilar lymph node has slightly increased in size currently measuring 18 x 15 mm, previously 16 x 14 mm. Soft tissue mass in the epicardial fat along the cardiac apex measures 3.3 x 2.1 cm (series 5, image 68), previously 2.7 x 2.0 cm. No esophageal wall thickening. Previous left thyroid nodule is not well-defined on the current exam due to streak artifact in the right subclavian vein from dense IV contrast. Surgical clips in the left axilla. Previous small right axillary nodes are partially obscured by dense IV contrast. Lungs/Pleura: Extensive bilateral pulmonary nodules are again seen, both random and pleural/subpleural. Majority of these are stable from recent prior, however largest nodule currently measures 2.2 x 1.2 cm, series 7, image  51, previously 1.9 x 1.1 cm. A small right pleural effusion is new, with adjacent compressive atelectasis. No septal thickening or findings of pulmonary edema. Upper Abdomen: Known  liver lesions on prior exam are less well-defined given phase of IV contrast. Musculoskeletal: Diffuse osseous metastatic disease throughout the thoracic osseous structures lateral left breast soft tissue nodularity measures 15 x 12 mm, series 5, image 49. With lytic and blastic osseous lesions. Callus formation about right anterior ribs fractures, also seen on previous. Review of the MIP images confirms the above findings. IMPRESSION: 1. No pulmonary embolus. 2. New small right pleural effusion with adjacent compressive atelectasis. 3. Extensive bilateral pulmonary nodules consistent with metastatic disease. Majority of these are stable from recent prior, however largest pulmonary nodule has increased in size from prior. 4. Left hilar lymph node has slightly increased in size from recent prior. Soft tissue mass in the epicardial fat along the left cardiac apex has increased in size. 5. Diffuse osseous metastatic disease. 6. Left breast soft tissue nodularity laterally again seen. 7. Known liver lesions on prior exam are less well-defined on the current exam given phase of IV contrast. 8. Extensive mural thickening with severe luminal narrowing involving the proximal left subclavian artery is unchanged from recent prior. Electronically Signed   By: Keith Rake M.D.   On: 12/01/2019 19:20   MR Brain W and Wo Contrast  Result Date: 11/29/2019 CLINICAL DATA:  Dizziness, nonspecific.  Brain mass or lesion. EXAM: MRI HEAD WITHOUT AND WITH CONTRAST TECHNIQUE: Multiplanar, multiecho pulse sequences of the brain and surrounding structures were obtained without and with intravenous contrast. CONTRAST:  23m GADAVIST GADOBUTROL 1 MMOL/ML IV SOLN COMPARISON:  11/29/2019 head CT. FINDINGS: Brain: No acute infarct. No acute intracranial hemorrhage. No midline shift, ventriculomegaly or extra-axial fluid collection. Diffuse dural thickening overlying the right cerebral convexity. Focal dural thickening overlying the left  parietal lobe (18:117). 1.5 x 0.5 cm right dural-based lesion overlying the temporal convexity (18:67). There is also dural thickening along the clivus. Vascular: Preserved major intracranial flow voids. Skull and upper cervical spine: Multifocal T2 hyperintense enhancing calvarial lesions reflect osseous metastases. Enhancing 1.8 cm right frontal calvarial soft tissue (15:35) with dehiscence of the overlying cortex. 2.6 x 1.8 cm clival metastasis (15:10). Smaller enhancing osseous lesions are also seen involving the bilateral sphenoid wings and occipital condyles. Sinuses/Orbits: Normal orbits clear paranasal sinuses. Trace right mastoid effusion. Other: 1.4 x 0.8 cm enhancing soft tissue involving the left TMJ (18:40). Enhancing soft tissue infiltration involving the right temporalis muscle which is asymmetrically thickened (18:94). IMPRESSION: Dural-based metastatic disease demonstrating right predominance. Focal plaque-like dural lesions measuring up to 1.5 x 0.5 cm overlie the right temporal and left parietal convexities. Multifocal osseous metastases with dominant lesions involving the clivus, right frontal calvarium and left TMJ. Metastatic involvement of the right temporalis muscle. Electronically Signed   By: CPrimitivo GauzeM.D.   On: 11/29/2019 19:25   MR CERVICAL SPINE W WO CONTRAST  Result Date: 12/02/2019 CLINICAL DATA:  Metastatic breast cancer with possible pathologic fracture in the cervical spine. EXAM: MRI CERVICAL SPINE WITHOUT AND WITH CONTRAST TECHNIQUE: Multiplanar and multiecho pulse sequences of the cervical spine, to include the craniocervical junction and cervicothoracic junction, were obtained without and with intravenous contrast. CONTRAST:  511mGADAVIST GADOBUTROL 1 MMOL/ML IV SOLN COMPARISON:  Neck CT 12/01/2019. Brain MRI 11/29/2019. Thoracic spine MRI 11/18/2019. FINDINGS: Alignment: Minimal retrolisthesis of C5 on C6 and C6 on C7. Vertebrae: Diffusely abnormal bone marrow  signal throughout the cervical spine, included upper thoracic spine, and skull base consistent with known widespread osseous metastatic disease. Thin curvilinear region of low signal through the base of the dens suggestive of a nondisplaced fracture with possible extension more laterally in the C2 vertebral body. Extra osseous tumor extension about the C2 anterior and posterior elements. Cord: Normal signal. Posterior Fossa, vertebral arteries, paraspinal tissues: Preserved vertebral artery flow voids. Small prevertebral effusion. Previously described clival metastasis with overlying dural thickening extending into the upper cervical spine. No masslike cervical epidural tumor. Disc levels: Cervical disc degeneration greatest at C5-6 where a broad-based posterior disc osteophyte complex and infolding of the ligamentum flavum result in mild-to-moderate spinal stenosis and mild-to-moderate right and severe left neural foraminal stenosis. Disc bulging and mild spurring at C6-7 resulting in moderate right and mild left neural foraminal stenosis without significant spinal stenosis. IMPRESSION: 1. Widespread osseous metastatic disease. 2. Suspected nondisplaced pathologic fracture involving the C2 vertebral body and base of dens. 3. 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 No masslike cervical dural thickening/epidural tumor. 4. Cervical disc degeneration, worst at C5-6 where there is mild-to-moderate spinal stenosis and severe left neural foraminal stenosis. These results will be called to the ordering clinician or representative by the Radiologist Assistant, and communication documented in the PACS or [employer]. Electronically Signed   By: Logan Bores M.D.   On: 12/02/2019 17:01   MR Thoracic Spine W Wo Contrast  Result Date: 11/18/2019 CLINICAL DATA:  Breast cancer EXAM: MRI THORACIC WITHOUT AND WITH CONTRAST TECHNIQUE:  Multiplanar and multiecho pulse sequences of the thoracic spine were obtained without and with intravenous contrast. CONTRAST:  47m GADAVIST GADOBUTROL 1 MMOL/ML IV SOLN COMPARISON:  10/24/2019 lumbar spine MRI FINDINGS: Cervical Localizer sequence is motion degraded. Therefore counting may not be accurate. Alignment: Trace retrolisthesis at T10-T11. Vertebrae: Heterogeneous STIR hyperintensity and enhancement throughout the thoracic spine. Mild compression deformity of T12 is unchanged since prior lumbar MRI. There is additional endplate irregularity and mild loss of height at T9 and T10. There is no significant epidural extension. Cord:  No abnormal signal.  No abnormal intrathecal enhancement. Paraspinal and other soft tissues: Extra-spinal metastatic disease is partially imaged and present on prior cross-sectional imaging. Disc levels: Mild multilevel degenerative disc disease and facet arthropathy. There is no high-grade degenerative stenosis. IMPRESSION: Diffuse osseous metastatic disease. No significant epidural extension. No definite acute compression deformity. Electronically Signed   By: PMacy MisM.D.   On: 11/18/2019 14:55   DG Chest Portable 1 View  Result Date: 12/08/2019 CLINICAL DATA:  Unresponsive EXAM: PORTABLE CHEST 1 VIEW COMPARISON:  Portable exam 0949 hours compared to 11/29/2019 FINDINGS: Normal heart size, mediastinal contours, and pulmonary vascularity. Central peribronchial thickening. Questionable nodularity at lateral LEFT upper and mid thorax unchanged from CT. Interstitial prominence in LEFT greater than RIGHT lungs. No segmental consolidation, pleural effusion, or pneumothorax. Gaseous distention of stomach. No acute infiltrate, pleural effusion, or pneumothorax. Osseous demineralization. IMPRESSION: Diffuse interstitial prominence greater on LEFT with persistent nodularity at the lateral aspect of the LEFT apex corresponding to nodularity on CT. No new abnormalities  identified. Electronically Signed   By: MLavonia DanaM.D.   On: 12/08/2019 10:31   DG Chest Port 1 View  Result Date: 11/29/2019 CLINICAL DATA:  Possible sepsis, metastatic breast cancer EXAM: PORTABLE CHEST 1 VIEW COMPARISON:  05/07/2019 FINDINGS: Increased interstitial prominence with patchy density. No pleural effusion or pneumothorax. Cardiomediastinal contours are  within normal limits with normal heart size. IMPRESSION: Interstitial prominence and patchy density, at least a component of which likely reflects metastatic disease seen on recent chest CT. There may be superimposed edema or atypical infection. Electronically Signed   By: Macy Mis M.D.   On: 11/29/2019 14:45   ECHOCARDIOGRAM COMPLETE  Result Date: 12/02/2019    ECHOCARDIOGRAM REPORT   Patient Name:   JOHAN ANTONACCI Date of Exam: 12/02/2019 Medical Rec #:  [id number]        Height:       63.0 in Accession #:    [id number]       Weight:       114.2 lb Date of Birth:  Mar 11, 1960       BSA:          1.524 m Patient Age:    78 years         BP:           131/68 mmHg Patient Gender: F                HR:           99 bpm. Exam Location:  [facility] Procedure: 2D Echo, Cardiac Doppler and Color Doppler Indications:    R94.31 Abnormal EKG; R00.0 Tachycardia  History:        Patient has no prior history of Echocardiogram examinations.                 Abnormal ECG. Breast cancer. Chemotherapy.  Sonographer:    Roseanna Rainbow RDCS (AE) Referring Phys: [phone] [facility]  Sonographer Comments: Technically difficult study due to poor echo windows. Rib artifact. Attempted to turn. Images off-axis. IMPRESSIONS  1. Left ventricular ejection fraction, by estimation, is 60 to 65%. The left ventricle has normal function. The left ventricle has no regional wall motion abnormalities. Left ventricular diastolic parameters are consistent with Grade I diastolic dysfunction (impaired relaxation).  2. Right ventricular systolic function is normal. The right  ventricular size is normal.  3. The mitral valve is normal in structure. No evidence of mitral valve regurgitation. No evidence of mitral stenosis.  4. The aortic valve is tricuspid. Aortic valve regurgitation is not visualized. No aortic stenosis is present.  5. The inferior vena cava is normal in size with greater than 50% respiratory variability, suggesting right atrial pressure of 3 mmHg. FINDINGS  Left Ventricle: Left ventricular ejection fraction, by estimation, is 60 to 65%. The left ventricle has normal function. The left ventricle has no regional wall motion abnormalities. The left ventricular internal cavity size was normal in size. There is  no left ventricular hypertrophy. Left ventricular diastolic parameters are consistent with Grade I diastolic dysfunction (impaired relaxation). Normal left ventricular filling pressure. Right Ventricle: The right ventricular size is normal. No increase in right ventricular wall thickness. Right ventricular systolic function is normal. Left Atrium: Left atrial size was normal in size. Right Atrium: Right atrial size was normal in size. Pericardium: There is no evidence of pericardial effusion. Mitral Valve: The mitral valve is normal in structure. No evidence of mitral valve regurgitation. No evidence of mitral valve stenosis. Tricuspid Valve: The tricuspid valve is normal in structure. Tricuspid valve regurgitation is not demonstrated. No evidence of tricuspid stenosis. Aortic Valve: The aortic valve is tricuspid. Aortic valve regurgitation is not visualized. No aortic stenosis is present. Aortic valve mean gradient measures 3.1 mmHg. Aortic valve peak gradient measures 6.4 mmHg. Aortic valve area, by VTI measures 1.83  cm. Pulmonic Valve: The pulmonic valve was not well visualized. Pulmonic valve regurgitation is not visualized. No evidence of pulmonic stenosis. Aorta: The aortic root is normal in size and structure. Pulmonary Artery: Indeterminant PASP, inadequate  TR jet. Venous: The inferior vena cava is normal in size with greater than 50% respiratory variability, suggesting right atrial pressure of 3 mmHg. IAS/Shunts: No atrial level shunt detected by color flow Doppler.  LEFT VENTRICLE PLAX 2D LVIDd:         3.36 cm     Diastology LVIDs:         2.16 cm     LV e' medial:   6.96 cm/s LV PW:         1.03 cm     LV E/e' medial: 12.4 LV IVS:        0.94 cm LVOT diam:     1.70 cm LV SV:         38 LV SV Index:   25 LVOT Area:     2.27 cm  LV Volumes (MOD) LV vol d, MOD A2C: 55.0 ml LV vol d, MOD A4C: 32.5 ml LV vol s, MOD A2C: 18.4 ml LV vol s, MOD A4C: 11.3 ml LV SV MOD A2C:     36.6 ml LV SV MOD A4C:     32.5 ml LV SV MOD BP:      32.0 ml RIGHT VENTRICLE RV S prime:     11.30 cm/s TAPSE (M-mode): 1.5 cm LEFT ATRIUM           Index      RIGHT ATRIUM          Index LA diam:      2.20 cm 1.44 cm/m RA Area:     7.17 cm LA Vol (A4C): 14.0 ml 9.19 ml/m RA Volume:   12.20 ml 8.01 ml/m  AORTIC VALVE AV Area (Vmax):    1.81 cm AV Area (Vmean):   1.59 cm AV Area (VTI):     1.83 cm AV Vmax:           126.43 cm/s AV Vmean:          81.029 cm/s AV VTI:            0.208 m AV Peak Grad:      6.4 mmHg AV Mean Grad:      3.1 mmHg LVOT Vmax:         101.00 cm/s LVOT Vmean:        56.600 cm/s LVOT VTI:          0.168 m LVOT/AV VTI ratio: 0.81  AORTA Ao Root diam: 2.80 cm Ao Asc diam:  3.20 cm MITRAL VALVE MV Area (PHT): 4.21 cm    SHUNTS MV Decel Time: 180 msec    Systemic VTI:  0.17 m MV E velocity: 86.50 cm/s  Systemic Diam: 1.70 cm MV A velocity: 90.80 cm/s MV E/A ratio:  0.95 Carlyle Dolly MD Electronically signed by Carlyle Dolly MD Signature Date/Time: 12/02/2019/3:28:22 PM    Final    [place] CORE BIOPSY (LIVER)  Result Date: 11/19/2019 INDICATION: History of breast carcinoma with new multiple liver lesions, lung nodules and bone lesions consistent with recurrent metastatic disease. Biopsy of a liver lesion has been requested to confirm metastatic disease and perform  molecular prognostic studies. EXAM: ULTRASOUND GUIDED CORE BIOPSY OF LIVER MEDICATIONS: None. ANESTHESIA/SEDATION: Fentanyl 100 mcg IV; Versed 2.0 mg IV Moderate Sedation Time:  16 minutes. The patient was continuously  monitored during the procedure by the interventional radiology nurse under my direct supervision. PROCEDURE: The procedure, risks, benefits, and alternatives were explained to the patient. Questions regarding the procedure were encouraged and answered. The patient understands and consents to the procedure. A time-out was performed prior to initiating the procedure. The abdominal wall was prepped with chlorhexidine in a sterile fashion, and a sterile drape was applied covering the operative field. A sterile gown and sterile gloves were used for the procedure. Local anesthesia was provided with 1% Lidocaine. Ultrasound was used to localize liver lesions. After choosing a lesion in the right lobe of the liver, a 17 gauge needle was advanced into the liver to the margin of the lesion. Three separate coaxial 18 gauge core biopsy samples were obtained through the lesion. Core biopsy samples were submitted in formalin. Gel-Foam pledgets were advanced through the outer needle as the needle was retracted and removed. Additional ultrasound was performed. COMPLICATIONS: None immediate. FINDINGS: There are multiple hypoechoic solid round and oval masses scattered throughout the liver parenchyma. The largest localized by ultrasound measures approximately 2 cm in diameter and lies within the anterior aspect of the right lobe near the gallbladder fossa. This lesion was sampled yielding solid tissue. IMPRESSION: Ultrasound-guided core biopsy performed of a 2 cm lesion within the right lobe of the liver. Electronically Signed   By: Aletta Edouard M.D.   On: 11/19/2019 15:42     CBC Recent Labs  Lab 12/04/19 0920 12/05/19 0502 12/08/19 1131 12/09/19 0446  WBC 8.3 8.8 9.9 8.3  HGB 8.5* 8.5* 8.4* 7.8*  HCT  28.0* 28.3* 27.5* 26.5*  PLT 312 296 304 280  MCV 84.6 84.2 84.9 87.2  MCH 25.7* 25.3* 25.9* 25.7*  MCHC 30.4 30.0 30.5 29.4*  RDW 19.9* 19.9* 20.4* 20.8*  LYMPHSABS  --   --  2.3  --   MONOABS  --   --  1.1*  --   EOSABS  --   --  0.1  --   BASOSABS  --   --  0.1  --     Chemistries  Recent Labs  Lab 12/04/19 0920 12/05/19 0502 12/08/19 1131 12/09/19 0446  NA 133* 136 137 137  K 3.4* 4.0 4.1 3.6  CL 103 107 107 106  CO2 20* 19* 19* 23  GLUCOSE 135* 85 120* 150*  BUN _0 CREATININE 0.89 0.62 1.25* 0.74  CALCIUM 8.1* 8.1* 9.4 8.9  MG  --  1.8  --   --   AST 90* 117* 73*  --   ALT 31 47* 39  --   ALKPHOS 175* 184* 206*  --   BILITOT 0.3 0.6 0.8  --    ------------------------------------------------------------------------------------------------------------------ No results for input(s): CHOL, HDL, LDLCALC, TRIG, CHOLHDL, LDLDIRECT in the last 72 hours.  Lab Results  Component Value Date   HGBA1C 6.7 (H) 11/29/2019   ------------------------------------------------------------------------------------------------------------------ No results for input(s): TSH, T4TOTAL, T3FREE, THYROIDAB in the last 72 hours.  Invalid input(s): FREET3 ------------------------------------------------------------------------------------------------------------------ No results for input(s): VITAMINB12, FOLATE, FERRITIN, TIBC, IRON, RETICCTPCT in the last 72 hours.  Coagulation profile No results for input(s): INR, PROTIME in the last 168 hours.  No results for input(s): DDIMER in the last 72 hours.  Cardiac Enzymes No results for input(s): CKMB, TROPONINI, MYOGLOBIN in the last 168 hours.  Invalid input(s): CK ------------------------------------------------------------------------------------------------------------------ No results found for: BNP   Roxan Hockey M.D on 12/10/2019 at 1:10 PM  Go to [email or web address] - for contact info  [facility] - Office   [phone]

## 2019-12-10 NOTE — TOC Initial Note (Addendum)
Transition of Care [facility]) - Initial/Assessment Note    Patient Details  Name: Krista Doyle MRN: [id number] Date of Birth: 09-09-1960  Transition of Care [facility]) CM/SW Contact:    Shade Flood, LCSW Phone Number: 12/10/2019, 1:31 PM  Clinical Narrative:                  Pt was discharged to [facility] rehab late last week and was readmitted to [facility] over the weekend. Pt and family have spoken with Palliative APNP and MD about options and per MD, they would like to attempt further rehab at [facility] upon dc. Awaiting PT evaluation. Once documented, TOC will initiate insurance authorization. Updated Debbie at [facility] who states that pt can return there if insurance authorizes.  TOC will follow.  1605: Referral sent to [facility] and additional [facility] SNFs at family preference. Assigned TOC will follow.   Expected Discharge Plan: Skilled Nursing Facility Barriers to Discharge: Insurance Authorization   Patient Goals and CMS Choice        Expected Discharge Plan and Services Expected Discharge Plan: [place] In-house Referral: Clinical Social Work                                            Prior Living Arrangements/Services   Lives with:: Spouse Patient language and need for interpreter reviewed:: Yes Do you feel safe going back to the place where you live?: Yes      Need for Family Participation in Patient Care: Yes (Comment) Care giver support system in place?: Yes (comment)   Criminal Activity/Legal Involvement Pertinent to Current Situation/Hospitalization: No - Comment as needed  Activities of Daily Living Home Assistive Devices/Equipment: CBG Meter, Walker (specify type) ADL Screening (condition at time of admission) Patient's cognitive ability adequate to safely complete daily activities?: Yes Is the patient deaf or have difficulty hearing?: No Does the patient have difficulty seeing, even when wearing glasses/contacts?:  No Does the patient have difficulty concentrating, remembering, or making decisions?: No Patient able to express need for assistance with ADLs?: Yes Does the patient have difficulty dressing or bathing?: Yes Independently performs ADLs?: No Communication: Independent Dressing (OT): Needs assistance Is this a change from baseline?: Pre-admission baseline Grooming: Needs assistance Is this a change from baseline?: Pre-admission baseline Feeding: Independent Bathing: Needs assistance Is this a change from baseline?: Pre-admission baseline Toileting: Needs assistance Is this a change from baseline?: Pre-admission baseline In/Out Bed: Needs assistance Is this a change from baseline?: Pre-admission baseline Walks in Home: Needs assistance Is this a change from baseline?: Pre-admission baseline Does the patient have difficulty walking or climbing stairs?: Yes Weakness of Legs: Both Weakness of Arms/Hands: Both  Permission Sought/Granted                  Emotional Assessment       Orientation: : Oriented to Self, Oriented to Place, Oriented to Situation, Oriented to  Time Alcohol / Substance Use: Not Applicable Psych Involvement: No (comment)  Admission diagnosis:  Encephalopathy [G93.40] Opiate overdose, accidental or unintentional, initial encounter ([place]) [I69.629B] Acute metabolic encephalopathy [M84.13] Cancer associated pain [G89.3] Patient Active Problem List   Diagnosis Date Noted  . Protein-calorie malnutrition, severe 12/10/2019  . Cancer associated pain 12/10/2019  . Encounter for hospice care discussion   . Acute metabolic encephalopathy 24/40/1027  . Overdose opiate, accidental or unintentional, initial encounter (  [place]) 12/08/2019  . Weakness   . Cancer related pain   . Palliative care by specialist   . Port-A-Cath in place 12/01/2019  . Hypercalcemia 11/29/2019  . Goals of care, counseling/discussion 11/28/2019  . Stage IV Metastatic Breast Cancer with Mets  to Bone/Liver/Lungs-- 11/28/2019  . Lumbar facet arthropathy 10/07/2015  . 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 Constipation due to opioid therapy 05/08/2015  . Genetic testing 03/17/2015  . Osteopenia determined by x-ray 03/13/2015  . Lumbar spondylosis 02/11/2015  . Lumbar radiculopathy 02/11/2015  . Chemotherapy-induced peripheral neuropathy ([place]) 02/11/2015  . Vitamin D deficiency 02/02/2015  . Stage IV Breast cancer of upper-outer quadrant of left female breast ([place]) 12/22/2014  . High risk medication use 12/22/2014   PCP:  [name], MD Pharmacy:  No Pharmacies Listed    Social Determinants of Health (SDOH) Interventions    Readmission Risk Interventions Readmission Risk Prevention Plan 12/06/2019 12/02/2019  Transportation Screening Complete Complete  PCP or Specialist Appt within 3-5 Days Complete -  HRI or [place] Complete Complete  Social Work Consult for [place] Planning/Counseling Complete Complete  Palliative Care Screening Complete Not Applicable  Medication Review [employer] photographer) Complete Complete  Some recent data might be hidden

## 2019-12-11 ENCOUNTER — Other Ambulatory Visit (HOSPITAL_COMMUNITY): Payer: Medicare Other

## 2019-12-11 ENCOUNTER — Ambulatory Visit (HOSPITAL_COMMUNITY): Payer: Medicare Other | Admitting: Hematology

## 2019-12-11 DIAGNOSIS — G9341 Metabolic encephalopathy: Secondary | ICD-10-CM

## 2019-12-11 LAB — COMPREHENSIVE METABOLIC PANEL
ALT: 70 U/L — ABNORMAL HIGH (ref 0–44)
AST: 165 U/L — ABNORMAL HIGH (ref 15–41)
Albumin: 2.5 g/dL — ABNORMAL LOW (ref 3.5–5.0)
Alkaline Phosphatase: 217 U/L — ABNORMAL HIGH (ref 38–126)
Anion gap: 13 (ref 5–15)
BUN: 5 mg/dL — ABNORMAL LOW (ref 8–23)
CO2: 23 mmol/L (ref 22–32)
Calcium: 8.9 mg/dL (ref 8.9–10.3)
Chloride: 100 mmol/L (ref 98–111)
Creatinine, Ser: 0.59 mg/dL (ref 0.44–1.00)
GFR, Estimated: >60 mL/min (ref >60)
Glucose, Bld: 134 mg/dL — ABNORMAL HIGH (ref 70–99)
Potassium: 2.6 mmol/L — CL (ref 3.5–5.1)
Sodium: 136 mmol/L (ref 135–145)
Total Bilirubin: 1 mg/dL (ref 0.3–1.2)
Total Protein: 7.7 g/dL (ref 6.5–8.1)

## 2019-12-11 LAB — GLUCOSE, CAPILLARY
Glucose-Capillary: 102 mg/dL — ABNORMAL HIGH (ref 70–99)
Glucose-Capillary: 111 mg/dL — ABNORMAL HIGH (ref 70–99)
Glucose-Capillary: 112 mg/dL — ABNORMAL HIGH (ref 70–99)
Glucose-Capillary: 113 mg/dL — ABNORMAL HIGH (ref 70–99)
Glucose-Capillary: 116 mg/dL — ABNORMAL HIGH (ref 70–99)

## 2019-12-11 LAB — CBC
HCT: 28.7 % — ABNORMAL LOW (ref 36.0–46.0)
Hemoglobin: 8.8 g/dL — ABNORMAL LOW (ref 12.0–15.0)
MCH: 25.5 pg — ABNORMAL LOW (ref 26.0–34.0)
MCHC: 30.7 g/dL (ref 30.0–36.0)
MCV: 83.2 fL (ref 80.0–100.0)
Platelets: 306 10*3/uL (ref 150–400)
RBC: 3.45 MIL/uL — ABNORMAL LOW (ref 3.87–5.11)
RDW: 20.5 % — ABNORMAL HIGH (ref 11.5–15.5)
WBC: 10.4 10*3/uL (ref 4.0–10.5)
nRBC: 1.7 % — ABNORMAL HIGH (ref 0.0–0.2)

## 2019-12-11 MED ORDER — POTASSIUM CHLORIDE 10 MEQ/100ML IV SOLN
10.0000 meq | INTRAVENOUS | Status: AC
  Administered 2019-12-11 (×2): 10 meq via INTRAVENOUS
  Filled 2019-12-11 (×2): qty 100

## 2019-12-11 MED ORDER — MOMETASONE FURO-FORMOTEROL FUM 200-5 MCG/ACT IN AERO: 2.0000 | INHALATION_SPRAY | Freq: Two times a day (BID) | RESPIRATORY_TRACT | Status: DC

## 2019-12-11 MED ORDER — POTASSIUM CHLORIDE CRYS ER 20 MEQ PO TBCR
40.0000 meq | EXTENDED_RELEASE_TABLET | Freq: Once | ORAL | Status: AC
  Administered 2019-12-11: 40 meq via ORAL
  Filled 2019-12-11: qty 2

## 2019-12-11 NOTE — Progress Notes (Signed)
Patient Demographics:    Krista Doyle, is a 59 y.o. female, DOB - [birth year], XMI:[id number]  Admit date - 12/08/2019   Admitting Physician Courage Denton Brick, MD  Outpatient Primary MD for the patient is [name], MD  LOS - 1   Chief Complaint  Patient presents with  . unresponsive        Subjective:    Krista Doyle reports that pain is reasonably controlled on current regimen.  Her son is at the bedside today.  She is sitting up in chair.  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 female  with past medical history of HTN, HLD,  chemotherapy-induced neuropathy, diabetes mellitus, history of Lt breast cancer visually diagnosed in 2008, status post lumpectomy with subsequent radiation therapy and chemotherapy, with recent reoccurrence of breast malignancy with diagnosis of metastatic disease to liver, lung and osseous metastasis involving the skull, lumbar/thoracic spine osseous metastasis, with C2 fracture, recently treated for hypercalcemia, admitted on 12/08/19 after accidental opiate overdose at [facility]   A/p 1)Accidental Opiate Overdose--- responded well to Narcan x 2 doses,  --- Patient with significant metastatic breast cancer related pain with lots of bony metastases as well as lung and liver/abdominal metastases -We need to find a balance between adequate  pain control while avoiding respiratory depression and oversedation -PTA patient was on Duragesic patch 100 mcg every 72 hours, will decrease to 50 mcg daily every 72 hours -Stopped p.o. Dilaudid, stopped methocarbamol and stopped Lyrica to avoid oversedation -Decreased Elavil to 10 mg nightly -May use as needed oxycodone for breakthrough pain -Okay to use Narcan as needed oversedation/respiratory distress --Pain control is improving  2)Stage IV Metastatic breast cancer with mets to the bone liver and lungs--- with significant underlying pain from same- metastatic disease to liver, lung and osseous metastasis involving the skull, lumbar/thoracic spine osseous metastasis, with C2 fracture, -Patient also has chemotherapy-induced peripheral neuropathy -Pain management as above #1 -Continue Femara for now -Discussed with pt's Oncologist Dr Toney Rakes current functional status does Not allow for palliative chemoRx at this time radiation -Patient to have outpatient oncology evaluation after she gets out of rehab if her functional status improves -Per patient's oncologist MRI brain findings are more likely related to skull/bony metastases, rather than leptomeningeal or brain mets -Patient was recently treated for hypercalcemia--continue to monitor calcium levels -Palliative care consult appreciated -Pain control improving  3)Acute metabolic encephalopathy--- secondary to #1 #2 above, Resolved  4)Moderate Protein caloric malnutrition in the setting of underlying malignancy---  continue Megace for appetite stimulation -c/n nutritional supplements  5)Social/Ethics--- plan of care, poor prognosis, advanced directives and goals of care discussed with patient son Mr Krista Doyle and patient's daughter-in-law Ms Krista Doyle as well as pt's Husband -Pt  is currently a full code with full scope of treatment -Palliative care consult noted  6)DM2-hold scheduled diabetic medications until  patient is more awake and oral intake is more reliable  --use Novolog/Humalog Sliding scale insulin with Accu-Cheks/Fingersticks as ordered   7) acute on chronic anemia of malignancy--- hemoglobin 7.8  - no evidence of ongoing bleeding -Suspect drop in H&H is partly due to hemodilution  8)AKI----acute kidney injury due to dehydration in the setting of poor oral intake and unresponsive episode    creatinine on admission= 1.25 , baseline creatinine = 0.6 on 12/05/2019   ,  renally adjust medications, avoid nephrotoxic agents / dehydration  / hypotension AKI has resolved with Hydration, Creatine has Normalized -Hydrate IV gently until oral intake is more reliable  9)Urinary Retention-- Requiring in and out catherization---suspect opiate related -Overall much improved  10) generalized weakness and deconditioning--- PT eval pending  --Weakness and deconditioning persist patient needs help with ADLs patient will most likely need SNF rehab prior to returning home  11) C2 fracture pathologic--continue neck collar use, pain control as above #1  12) hypokalemia.  Replace  Disposition/Need for in-Hospital Stay- patient unable to be discharged at this time due to --accidental narcotic overdose, needs for adequate pain control/adjustment of pain medications as well as need for IV fluids for AKI until acute metabolic encephalopathy improves enough and oral intake is more reliable   Disposition: The patient is from: SNF [facility]  Anticipated d/c is to: SNF  Anticipated d/c date is: 1 day  Patient currently is not medically stable to d/c. Barriers: Not Clinically Stable- accidental narcotic overdose, needs for adequate pain control/adjustment of pain medications as well as need for IV fluids for AKI until acute metabolic encephalopathy improves enough and oral intake is more reliable  Code Status : full  Family Communication:   discussed with patient son Mr  Krista Doyle at the bedside Consults  :  palliative  DVT Prophylaxis  : - Heparin - SCDs    Lab Results  Component Value Date   PLT 306 12/11/2019    Inpatient Medications  Scheduled Meds: . amitriptyline  10 mg Oral QHS  . atorvastatin  20 mg Oral QHS  . Chlorhexidine Gluconate Cloth  6 each Topical Daily  . feeding supplement  237 mL Oral TID BM  . fentaNYL  1 patch Transdermal Q72H  . heparin  5,000 Units Subcutaneous Q8H  . insulin aspart  0-5 Units Subcutaneous QHS  . insulin aspart  0-6 Units Subcutaneous TID WC  . lactulose  20 g Oral Daily  . letrozole  2.5 mg Oral Daily  . lidocaine-prilocaine   Topical Once  . megestrol  20 mg Oral BID  . metoprolol tartrate  50 mg Oral BID  . mometasone-formoterol  2 puff Inhalation BID  . polyethylene glycol  17 g Oral Daily  . senna-docusate  2 tablet Oral BID  . sodium chloride flush  3 mL Intravenous Q12H  . sodium chloride flush  3 mL Intravenous Q12H   Continuous Infusions: . sodium chloride    . potassium chloride 10 mEq (12/11/19 1827)   PRN Meds:.sodium chloride, acetaminophen **OR** acetaminophen, bisacodyl, labetalol, LORazepam, naLOXone (NARCAN)  injection, ondansetron **OR** ondansetron (ZOFRAN) IV, oxyCODONE, phenol, sodium chloride, sodium chloride flush, traZODone    Anti-infectives (From admission, onward)   None        Objective:   Vitals:   12/11/19 0051 12/11/19 0502 12/11/19 1504 12/11/19 1711  BP: 133/76 (!) 153/89 140/86  136/84  Pulse: (!) 101 (!) 110 100 99  Resp: _0 Temp: 99.8 F (37.7 C) 100.2 F (37.9 C) 99.6 F (37.6 C) 99.4 F (37.4 C)  TempSrc: Oral Oral Oral Oral  SpO2: 100% 100% 100% 100%  Weight:      Height:        Wt Readings from Last 3 Encounters:  12/08/19 51.8 kg  12/01/19 51.8 kg  11/19/19 49.4 kg    Intake/Output Summary (Last 24 hours) at 12/11/2019 2020 Last data filed at 12/11/2019 1243 Gross per 24 hour  Intake 3 ml  Output --  Net 3 ml      Physical Exam  General exam: Alert, awake, oriented x 3, neck is in c-collar Respiratory system: Clear to auscultation. Respiratory effort normal. Cardiovascular system:RRR. No murmurs, rubs, gallops. Gastrointestinal system: Abdomen is nondistended, soft and nontender. No organomegaly or masses felt. Normal bowel sounds heard. Central nervous system: Alert and oriented. No focal neurological deficits. Extremities: No C/C/E, +pedal pulses Skin: No rashes, lesions or ulcers Psychiatry: Judgement and insight appear normal. Mood & affect appropriate.    Data Review:   Micro Results Recent Results (from the past 240 hour(s))  Respiratory Panel by RT PCR (Flu A&B, Covid) - Nasopharyngeal Swab     Status: None   Collection Time: 12/05/19  3:28 PM   Specimen: Nasopharyngeal Swab  Result Value Ref Range Status   SARS Coronavirus 2 by RT PCR NEGATIVE NEGATIVE Final    Comment: (NOTE) SARS-CoV-2 target nucleic acids are NOT DETECTED.  The SARS-CoV-2 RNA is generally detectable in upper respiratoy specimens during the acute phase of infection. The lowest concentration of SARS-CoV-2 viral copies this assay can detect is 131 copies/mL. A negative result does not preclude SARS-Cov-2 infection and should not be used as the sole basis for treatment or other patient management decisions. A negative result may occur with  improper specimen collection/handling, submission of specimen other than nasopharyngeal swab, presence of viral mutation(s) within the areas targeted by this assay, and inadequate number of viral copies (<131 copies/mL). A negative result must be combined with clinical observations, patient history, and epidemiological information. The expected result is Negative.  Fact Sheet for Patients:  PinkCheek.be  Fact Sheet for Healthcare Providers:  GravelBags.it  This test is no t yet approved or cleared by the [place] FDA and  has been authorized for detection and/or diagnosis of SARS-CoV-2 by FDA under an Emergency Use Authorization (EUA). This EUA will remain  in effect (meaning this test can be used) for the duration of the COVID-19 declaration under Section 564(b)(1) of the Act, 21 U.S.C. section 360bbb-3(b)(1), unless the authorization is terminated or revoked sooner.     Influenza A by PCR NEGATIVE NEGATIVE Final   Influenza B by PCR NEGATIVE NEGATIVE Final    Comment: (NOTE) The Xpert Xpress SARS-CoV-2/FLU/RSV assay is intended as an aid in  the diagnosis of influenza from Nasopharyngeal swab specimens and  should not be used as a sole basis for treatment. Nasal washings and  aspirates are unacceptable for Xpert Xpress SARS-CoV-2/FLU/RSV  testing.  Fact Sheet for Patients: PinkCheek.be  Fact Sheet for Healthcare Providers: GravelBags.it  This test is not yet approved or cleared by the [place] FDA and  has been authorized for detection and/or diagnosis of SARS-CoV-2 by  FDA under an Emergency Use Authorization (EUA). This EUA will remain  in effect (meaning this test can be used) for the duration of the  Covid-19 declaration under Section 564(b)(1) of the Act, 21  U.S.C. section 360bbb-3(b)(1), unless the authorization is  terminated or revoked. Performed at [facility], [address], [place], [place] [address]   Respiratory Panel by RT PCR (Flu A&B, Covid) - Nasopharyngeal Swab     Status: None   Collection Time: 12/08/19 11:31 AM   Specimen: Nasopharyngeal Swab  Result Value Ref Range Status   SARS Coronavirus 2 by RT PCR NEGATIVE NEGATIVE Final    Comment: (NOTE) SARS-CoV-2 target nucleic acids are NOT DETECTED.  The SARS-CoV-2 RNA is generally detectable in upper respiratoy specimens during the acute phase of infection. The lowest concentration of SARS-CoV-2 viral copies this assay can detect is 131  copies/mL. A negative result does not preclude SARS-Cov-2 infection and should not be used as the sole basis for treatment or other patient management decisions. A negative result may occur with  improper specimen collection/handling, submission of specimen other than nasopharyngeal swab, presence of viral mutation(s) within the areas targeted by this assay, and inadequate number of viral copies (<131 copies/mL). A negative result must be combined with clinical observations, patient history, and epidemiological information. The expected result is Negative.  Fact Sheet for Patients:  PinkCheek.be  Fact Sheet for Healthcare Providers:  GravelBags.it  This test is no t yet approved or cleared by the [place] FDA and  has been authorized for detection and/or diagnosis of SARS-CoV-2 by FDA under an Emergency Use Authorization (EUA). This EUA will remain  in effect (meaning this test can be used) for the duration of the COVID-19 declaration under Section 564(b)(1) of the Act, 21 U.S.C. section 360bbb-3(b)(1), unless the authorization is terminated or revoked sooner.     Influenza A by PCR NEGATIVE NEGATIVE Final   Influenza B by PCR NEGATIVE NEGATIVE Final    Comment: (NOTE) The Xpert Xpress SARS-CoV-2/FLU/RSV assay is intended as an aid in  the diagnosis of influenza from Nasopharyngeal swab specimens and  should not be used as a sole basis for treatment. Nasal washings and  aspirates are unacceptable for Xpert Xpress SARS-CoV-2/FLU/RSV  testing.  Fact Sheet for Patients: PinkCheek.be  Fact Sheet for Healthcare Providers: GravelBags.it  This test is not yet approved or cleared by the [place] FDA and  has been authorized for detection and/or diagnosis of SARS-CoV-2 by  FDA under an Emergency Use Authorization (EUA). This EUA will remain  in effect (meaning  this test can be used) for the duration of the  Covid-19 declaration under Section 564(b)(1) of the Act, 21  U.S.C. section 360bbb-3(b)(1), unless the authorization is  terminated or revoked. Performed at [facility], [address] [place], [place] [address]     Radiology Reports CT Head Wo Contrast  Result Date: 12/08/2019 CLINICAL DATA:  Mental status changes metastatic breast cancer EXAM: CT HEAD WITHOUT CONTRAST TECHNIQUE: Contiguous axial images were obtained from the base of the skull through the vertex without intravenous contrast. COMPARISON:  11/29/2019 FINDINGS: Brain: Stable age related atrophy pattern without acute intracranial hemorrhage, acute infarction, new mass lesion, midline shift, herniation, hydrocephalus, or extra-axial fluid collection. No focal mass effect or edema. Cisterns are patent. No cerebellar abnormality. Vascular: No hyperdense vessel or unexpected calcification. Skull: Similar mottled irregular lytic skull lesions most pronounced in the bifrontal areas, right parietal area, right skull base and clivus. Associated right frontal dural thickening/involvement without change. Sinuses/Orbits: No acute finding. Other: None. IMPRESSION: No acute intracranial abnormality by noncontrast CT. Stable calvarial osseous metastases. Electronically Signed  By: Eugenie Filler M.D.   On: 12/08/2019 11:57   CT Head Wo Contrast  Result Date: 11/29/2019 CLINICAL DATA:  Delirium EXAM: CT HEAD WITHOUT CONTRAST TECHNIQUE: Contiguous axial images were obtained from the base of the skull through the vertex without intravenous contrast. COMPARISON:  None. FINDINGS: Brain: No acute infarct or intracranial hemorrhage. No mass lesion. No midline shift, ventriculomegaly or extra-axial fluid collection. Vascular: No hyperdense vessel or unexpected calcification. Carotid siphon atherosclerotic calcifications. Skull: Mottled, heterogenous appearance of the bifrontal, right parietal calvarium and clivus.  There is a 1.6 cm right calvarial soft tissue focus with dehiscence of the bone (2:18). Sinuses/Orbits: Normal orbits. Clear paranasal sinuses. No mastoid effusion. Other: Asymmetric prominence of the right temporalis muscle (2:12). IMPRESSION: No acute infarct or intracranial hemorrhage. Right frontal calvarial soft tissue, asymmetric prominence of the right temporalis muscle and calvarial/clivus heterogeneity is concerning for metastases. MRI head with and without contrast is recommended for further evaluation. Electronically Signed   By: Primitivo Gauze M.D.   On: 11/29/2019 16:37   CT SOFT TISSUE NECK W CONTRAST  Result Date: 12/01/2019 CLINICAL DATA:  Epiglottitis or tonsillitis suspected.  Neck pain. EXAM: CT NECK WITH CONTRAST TECHNIQUE: Multidetector CT imaging of the neck was performed using the standard protocol following the bolus administration of intravenous contrast. CONTRAST:  4m OMNIPAQUE IOHEXOL 350 MG/ML SOLN COMPARISON:  11/29/2019 CT and MRI head.  Concurrent CTA chest. FINDINGS: Pharynx and larynx: Clear nasopharynx. Normal appearance of the epiglottis. Partial effacement of the left vallecula and piriform sinus, likely secondary to neck positioning. The vallecula and piriform sinuses are otherwise unremarkable. No vocal cord paralysis. Small retropharyngeal effusion spanning the C2-C5 levels. Salivary glands: No inflammation, mass, or stone. Thyroid: Normal. Lymph nodes: Diffusely prominent subcentimeter cervical nodes. Vascular: Normal intravascular enhancement. Limited intracranial: Please see recent CT and MRI head. Visualized orbits: Normal orbits. Mastoids and visualized paranasal sinuses: Pneumatized. Skeleton: Diffuse heterogeneity of the bone marrow predominantly involving the cervical spine at the C1-2 and C6-T1 levels. There is multilevel involvement of the posterior elements. Vertebral body heights are preserved. Perivertebral inflammatory changes most prominent at the C2  level. Please see recent CT and MRI head for better evaluation of dominant clival and multifocal calvarial osseous metastases. Redemonstration of anterior left TMJ metastasis. Upper chest: Partially imaged pulmonary metastases. Please see concurrent CTA chest for additional findings below the thoracic inlet. Other: None. IMPRESSION: No evidence of epiglottitis or tonsillitis. Diffuse osseous metastases with prominent involvement of the cervical spine. Vertebral body heights are preserved however cannot exclude pending pathologic fracture. Perivertebral inflammatory changes most prominent at the atlantoaxial junction with small retropharyngeal effusion. Cannot exclude metastatic soft tissue involvement. MRI cervical spine is recommended for better evaluation. Partially imaged pulmonary metastases. Please see concurrent CTA chest for additional findings below the thoracic inlet. These results will be called to the ordering clinician or representative by the Radiologist Assistant, and communication documented in the PACS or [employer] Electronically Signed   By: CPrimitivo GauzeM.D.   On: 12/01/2019 19:30   CT Chest W Contrast  Result Date: 11/13/2019 CLINICAL DATA:  Breast cancer, recent weight loss and decreased appetite. Liver lesions on recent CT abdomen pelvis. EXAM: CT CHEST WITH CONTRAST TECHNIQUE: Multidetector CT imaging of the chest was performed during intravenous contrast administration. CONTRAST:  757mOMNIPAQUE IOHEXOL 300 MG/ML  SOLN COMPARISON:  CT abdomen pelvis 11/02/2019, MR lumbar spine 10/24/2019. FINDINGS: Cardiovascular: Extensive mural thickening and severe luminal narrowing involving the left subclavian artery.  Heart size normal. No pericardial effusion. Mediastinum/Nodes: Mildly hypodense left thyroid nodule measures 1.3 cm. No follow-up recommended (ref: J Am Coll Radiol. 2015 Feb;12(2): 143-50).No pathologically enlarged mediastinal lymph nodes. Left hilar lymph node measures  1.4 x 1.6 cm. Right axillary lymph nodes measure up to 8 mm. Surgical clips in the left axilla. Soft tissue mass in the epicardial fat along the cardiac apex measures 2.0 x 2.7 cm (2/102). Esophagus is grossly unremarkable. Lungs/Pleura: Diffuse perilymphatic nodularity bilaterally. Extensive pleural/subpleural nodularity in the left hemithorax. Index subpleural nodule in the anterior left upper lobe measures 1.1 x 1.9 cm (4/50). No pleural fluid. Airway is unremarkable. Upper Abdomen: Multiple heterogeneous thick-walled lesions throughout the liver, seen in their entirety on 11/02/2019. Index lesion in the dome of the liver measures 1.9 x 2.1 cm (2/107). Visualized portion of the gallbladder is unremarkable. Slight nodular thickening of both adrenal glands. Subcentimeter low-attenuation lesion in the left kidney is likely a cyst. Visualized portions of the kidneys, spleen, pancreas, stomach and bowel are grossly unremarkable. Periportal lymph nodes measure up to 11 mm. Gastrohepatic ligament lymph nodes, 5 mm. Musculoskeletal: Mottled sclerotic metastases are seen throughout the visualized osseous structures. Healing or healed pathologic right rib fractures. Pathologic superior endplate compression fracture involving T12. Nodular soft tissue in the lateral left breast measures 1.1 x 1.7 cm (2/60). IMPRESSION: 1. Widespread pleuroparenchymal, left hilar, hepatic and osseous metastatic disease. Lateral left breast mass may be postoperative in etiology. Disease recurrence cannot be excluded. 2. Extensive mural thickening and severe luminal narrowing involving the left subclavian artery. Electronically Signed   By: Lorin Picket M.D.   On: 11/13/2019 09:54   CT ANGIO CHEST PE W OR WO CONTRAST  Result Date: 12/01/2019 CLINICAL DATA:  PE suspected, low/intermediate prob, positive D-dimer History of metastatic breast cancer. EXAM: CT ANGIOGRAPHY CHEST WITH CONTRAST TECHNIQUE: Multidetector CT imaging of the chest  was performed using the standard protocol during bolus administration of intravenous contrast. Multiplanar CT image reconstructions and MIPs were obtained to evaluate the vascular anatomy. CONTRAST:  55m OMNIPAQUE IOHEXOL 350 MG/ML SOLN COMPARISON:  Chest CT 11/12/2019, thoracic spine MRI 11/18/2019 FINDINGS: Cardiovascular: There are no filling defects within the pulmonary arteries to suggest pulmonary embolus. Left hilar lymph node is enlarging in causing increasing mass effect on lingular pulmonary artery but no evidence of invasion. Common origin of brachiocephalic and left common carotid artery, variant arch anatomy. Extensive mural thickening with severe luminal narrowing involving the proximal left subclavian artery is unchanged from recent exam. There is no aortic dissection or acute aortic findings. The heart is normal in size. No pericardial effusion. Mediastinum/Nodes: Left hilar lymph node has slightly increased in size currently measuring 18 x 15 mm, previously 16 x 14 mm. Soft tissue mass in the epicardial fat along the cardiac apex measures 3.3 x 2.1 cm (series 5, image 68), previously 2.7 x 2.0 cm. No esophageal wall thickening. Previous left thyroid nodule is not well-defined on the current exam due to streak artifact in the right subclavian vein from dense IV contrast. Surgical clips in the left axilla. Previous small right axillary nodes are partially obscured by dense IV contrast. Lungs/Pleura: Extensive bilateral pulmonary nodules are again seen, both random and pleural/subpleural. Majority of these are stable from recent prior, however largest nodule currently measures 2.2 x 1.2 cm, series 7, image 51, previously 1.9 x 1.1 cm. A small right pleural effusion is new, with adjacent compressive atelectasis. No septal thickening or findings of pulmonary edema. Upper Abdomen:  Known liver lesions on prior exam are less well-defined given phase of IV contrast. Musculoskeletal: Diffuse osseous  metastatic disease throughout the thoracic osseous structures lateral left breast soft tissue nodularity measures 15 x 12 mm, series 5, image 49. With lytic and blastic osseous lesions. Callus formation about right anterior ribs fractures, also seen on previous. Review of the MIP images confirms the above findings. IMPRESSION: 1. No pulmonary embolus. 2. New small right pleural effusion with adjacent compressive atelectasis. 3. Extensive bilateral pulmonary nodules consistent with metastatic disease. Majority of these are stable from recent prior, however largest pulmonary nodule has increased in size from prior. 4. Left hilar lymph node has slightly increased in size from recent prior. Soft tissue mass in the epicardial fat along the left cardiac apex has increased in size. 5. Diffuse osseous metastatic disease. 6. Left breast soft tissue nodularity laterally again seen. 7. Known liver lesions on prior exam are less well-defined on the current exam given phase of IV contrast. 8. Extensive mural thickening with severe luminal narrowing involving the proximal left subclavian artery is unchanged from recent prior. Electronically Signed   By: Keith Rake M.D.   On: 12/01/2019 19:20   MR Brain W and Wo Contrast  Result Date: 11/29/2019 CLINICAL DATA:  Dizziness, nonspecific.  Brain mass or lesion. EXAM: MRI HEAD WITHOUT AND WITH CONTRAST TECHNIQUE: Multiplanar, multiecho pulse sequences of the brain and surrounding structures were obtained without and with intravenous contrast. CONTRAST:  25m GADAVIST GADOBUTROL 1 MMOL/ML IV SOLN COMPARISON:  11/29/2019 head CT. FINDINGS: Brain: No acute infarct. No acute intracranial hemorrhage. No midline shift, ventriculomegaly or extra-axial fluid collection. Diffuse dural thickening overlying the right cerebral convexity. Focal dural thickening overlying the left parietal lobe (18:117). 1.5 x 0.5 cm right dural-based lesion overlying the temporal convexity (18:67). There  is also dural thickening along the clivus. Vascular: Preserved major intracranial flow voids. Skull and upper cervical spine: Multifocal T2 hyperintense enhancing calvarial lesions reflect osseous metastases. Enhancing 1.8 cm right frontal calvarial soft tissue (15:35) with dehiscence of the overlying cortex. 2.6 x 1.8 cm clival metastasis (15:10). Smaller enhancing osseous lesions are also seen involving the bilateral sphenoid wings and occipital condyles. Sinuses/Orbits: Normal orbits clear paranasal sinuses. Trace right mastoid effusion. Other: 1.4 x 0.8 cm enhancing soft tissue involving the left TMJ (18:40). Enhancing soft tissue infiltration involving the right temporalis muscle which is asymmetrically thickened (18:94). IMPRESSION: Dural-based metastatic disease demonstrating right predominance. Focal plaque-like dural lesions measuring up to 1.5 x 0.5 cm overlie the right temporal and left parietal convexities. Multifocal osseous metastases with dominant lesions involving the clivus, right frontal calvarium and left TMJ. Metastatic involvement of the right temporalis muscle. Electronically Signed   By: CPrimitivo GauzeM.D.   On: 11/29/2019 19:25   MR CERVICAL SPINE W WO CONTRAST  Result Date: 12/02/2019 CLINICAL DATA:  Metastatic breast cancer with possible pathologic fracture in the cervical spine. EXAM: MRI CERVICAL SPINE WITHOUT AND WITH CONTRAST TECHNIQUE: Multiplanar and multiecho pulse sequences of the cervical spine, to include the craniocervical junction and cervicothoracic junction, were obtained without and with intravenous contrast. CONTRAST:  525mGADAVIST GADOBUTROL 1 MMOL/ML IV SOLN COMPARISON:  Neck CT 12/01/2019. Brain MRI 11/29/2019. Thoracic spine MRI 11/18/2019. FINDINGS: Alignment: Minimal retrolisthesis of C5 on C6 and C6 on C7. Vertebrae: Diffusely abnormal bone marrow signal throughout the cervical spine, included upper thoracic spine, and skull base consistent with known  widespread osseous metastatic disease. Thin curvilinear region of low signal through the  base of the dens suggestive of a nondisplaced fracture with possible extension more laterally in the C2 vertebral body. Extra osseous tumor extension about the C2 anterior and posterior elements. Cord: Normal signal. Posterior Fossa, vertebral arteries, paraspinal tissues: Preserved vertebral artery flow voids. Small prevertebral effusion. Previously described clival metastasis with overlying dural thickening extending into the upper cervical spine. No masslike cervical epidural tumor. Disc levels: Cervical disc degeneration greatest at C5-6 where a broad-based posterior disc osteophyte complex and infolding of the ligamentum flavum result in mild-to-moderate spinal stenosis and mild-to-moderate right and severe left neural foraminal stenosis. Disc bulging and mild spurring at C6-7 resulting in moderate right and mild left neural foraminal stenosis without significant spinal stenosis. IMPRESSION: 1. Widespread osseous metastatic disease. 2. Suspected nondisplaced pathologic fracture involving the C2 vertebral body and base of dens. 3. 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 No masslike cervical dural thickening/epidural tumor. 4. Cervical disc degeneration, worst at C5-6 where there is mild-to-moderate spinal stenosis and severe left neural foraminal stenosis. These results will be called to the ordering clinician or representative by the Radiologist Assistant, and communication documented in the PACS or [employer]. Electronically Signed   By: Logan Bores M.D.   On: 12/02/2019 17:01   MR Thoracic Spine W Wo Contrast  Result Date: 11/18/2019 CLINICAL DATA:  Breast cancer EXAM: MRI THORACIC WITHOUT AND WITH CONTRAST TECHNIQUE: Multiplanar and multiecho pulse sequences of the thoracic spine were obtained without and with intravenous  contrast. CONTRAST:  65m GADAVIST GADOBUTROL 1 MMOL/ML IV SOLN COMPARISON:  10/24/2019 lumbar spine MRI FINDINGS: Cervical Localizer sequence is motion degraded. Therefore counting may not be accurate. Alignment: Trace retrolisthesis at T10-T11. Vertebrae: Heterogeneous STIR hyperintensity and enhancement throughout the thoracic spine. Mild compression deformity of T12 is unchanged since prior lumbar MRI. There is additional endplate irregularity and mild loss of height at T9 and T10. There is no significant epidural extension. Cord:  No abnormal signal.  No abnormal intrathecal enhancement. Paraspinal and other soft tissues: Extra-spinal metastatic disease is partially imaged and present on prior cross-sectional imaging. Disc levels: Mild multilevel degenerative disc disease and facet arthropathy. There is no high-grade degenerative stenosis. IMPRESSION: Diffuse osseous metastatic disease. No significant epidural extension. No definite acute compression deformity. Electronically Signed   By: PMacy MisM.D.   On: 11/18/2019 14:55   DG Chest Portable 1 View  Result Date: 12/08/2019 CLINICAL DATA:  Unresponsive EXAM: PORTABLE CHEST 1 VIEW COMPARISON:  Portable exam 0949 hours compared to 11/29/2019 FINDINGS: Normal heart size, mediastinal contours, and pulmonary vascularity. Central peribronchial thickening. Questionable nodularity at lateral LEFT upper and mid thorax unchanged from CT. Interstitial prominence in LEFT greater than RIGHT lungs. No segmental consolidation, pleural effusion, or pneumothorax. Gaseous distention of stomach. No acute infiltrate, pleural effusion, or pneumothorax. Osseous demineralization. IMPRESSION: Diffuse interstitial prominence greater on LEFT with persistent nodularity at the lateral aspect of the LEFT apex corresponding to nodularity on CT. No new abnormalities identified. Electronically Signed   By: MLavonia DanaM.D.   On: 12/08/2019 10:31   DG Chest Port 1 View  Result  Date: 11/29/2019 CLINICAL DATA:  Possible sepsis, metastatic breast cancer EXAM: PORTABLE CHEST 1 VIEW COMPARISON:  05/07/2019 FINDINGS: Increased interstitial prominence with patchy density. No pleural effusion or pneumothorax. Cardiomediastinal contours are within normal limits with normal heart size. IMPRESSION: Interstitial prominence and patchy density, at least a component of which likely reflects metastatic disease seen on recent chest  CT. There may be superimposed edema or atypical infection. Electronically Signed   By: Macy Mis M.D.   On: 11/29/2019 14:45   ECHOCARDIOGRAM COMPLETE  Result Date: 12/02/2019    ECHOCARDIOGRAM REPORT   Patient Name:   ORMA CHEETHAM Date of Exam: 12/02/2019 Medical Rec #:  [id number]        Height:       63.0 in Accession #:    [id number]       Weight:       114.2 lb Date of Birth:  1960/04/07       BSA:          1.524 m Patient Age:    29 years         BP:           131/68 mmHg Patient Gender: F                HR:           99 bpm. Exam Location:  [facility] Procedure: 2D Echo, Cardiac Doppler and Color Doppler Indications:    R94.31 Abnormal EKG; R00.0 Tachycardia  History:        Patient has no prior history of Echocardiogram examinations.                 Abnormal ECG. Breast cancer. Chemotherapy.  Sonographer:    Roseanna Rainbow RDCS (AE) Referring Phys: [phone] [facility] Alfie Alderfer  Sonographer Comments: Technically difficult study due to poor echo windows. Rib artifact. Attempted to turn. Images off-axis. IMPRESSIONS  1. Left ventricular ejection fraction, by estimation, is 60 to 65%. The left ventricle has normal function. The left ventricle has no regional wall motion abnormalities. Left ventricular diastolic parameters are consistent with Grade I diastolic dysfunction (impaired relaxation).  2. Right ventricular systolic function is normal. The right ventricular size is normal.  3. The mitral valve is normal in structure. No evidence of mitral valve regurgitation. No  evidence of mitral stenosis.  4. The aortic valve is tricuspid. Aortic valve regurgitation is not visualized. No aortic stenosis is present.  5. The inferior vena cava is normal in size with greater than 50% respiratory variability, suggesting right atrial pressure of 3 mmHg. FINDINGS  Left Ventricle: Left ventricular ejection fraction, by estimation, is 60 to 65%. The left ventricle has normal function. The left ventricle has no regional wall motion abnormalities. The left ventricular internal cavity size was normal in size. There is  no left ventricular hypertrophy. Left ventricular diastolic parameters are consistent with Grade I diastolic dysfunction (impaired relaxation). Normal left ventricular filling pressure. Right Ventricle: The right ventricular size is normal. No increase in right ventricular wall thickness. Right ventricular systolic function is normal. Left Atrium: Left atrial size was normal in size. Right Atrium: Right atrial size was normal in size. Pericardium: There is no evidence of pericardial effusion. Mitral Valve: The mitral valve is normal in structure. No evidence of mitral valve regurgitation. No evidence of mitral valve stenosis. Tricuspid Valve: The tricuspid valve is normal in structure. Tricuspid valve regurgitation is not demonstrated. No evidence of tricuspid stenosis. Aortic Valve: The aortic valve is tricuspid. Aortic valve regurgitation is not visualized. No aortic stenosis is present. Aortic valve mean gradient measures 3.1 mmHg. Aortic valve peak gradient measures 6.4 mmHg. Aortic valve area, by VTI measures 1.83 cm. Pulmonic Valve: The pulmonic valve was not well visualized. Pulmonic valve regurgitation is not visualized. No evidence of pulmonic stenosis. Aorta: The aortic root is normal  in size and structure. Pulmonary Artery: Indeterminant PASP, inadequate TR jet. Venous: The inferior vena cava is normal in size with greater than 50% respiratory variability, suggesting right  atrial pressure of 3 mmHg. IAS/Shunts: No atrial level shunt detected by color flow Doppler.  LEFT VENTRICLE PLAX 2D LVIDd:         3.36 cm     Diastology LVIDs:         2.16 cm     LV e' medial:   6.96 cm/s LV PW:         1.03 cm     LV E/e' medial: 12.4 LV IVS:        0.94 cm LVOT diam:     1.70 cm LV SV:         38 LV SV Index:   25 LVOT Area:     2.27 cm  LV Volumes (MOD) LV vol d, MOD A2C: 55.0 ml LV vol d, MOD A4C: 32.5 ml LV vol s, MOD A2C: 18.4 ml LV vol s, MOD A4C: 11.3 ml LV SV MOD A2C:     36.6 ml LV SV MOD A4C:     32.5 ml LV SV MOD BP:      32.0 ml RIGHT VENTRICLE RV S prime:     11.30 cm/s TAPSE (M-mode): 1.5 cm LEFT ATRIUM           Index      RIGHT ATRIUM          Index LA diam:      2.20 cm 1.44 cm/m RA Area:     7.17 cm LA Vol (A4C): 14.0 ml 9.19 ml/m RA Volume:   12.20 ml 8.01 ml/m  AORTIC VALVE AV Area (Vmax):    1.81 cm AV Area (Vmean):   1.59 cm AV Area (VTI):     1.83 cm AV Vmax:           126.43 cm/s AV Vmean:          81.029 cm/s AV VTI:            0.208 m AV Peak Grad:      6.4 mmHg AV Mean Grad:      3.1 mmHg LVOT Vmax:         101.00 cm/s LVOT Vmean:        56.600 cm/s LVOT VTI:          0.168 m LVOT/AV VTI ratio: 0.81  AORTA Ao Root diam: 2.80 cm Ao Asc diam:  3.20 cm MITRAL VALVE MV Area (PHT): 4.21 cm    SHUNTS MV Decel Time: 180 msec    Systemic VTI:  0.17 m MV E velocity: 86.50 cm/s  Systemic Diam: 1.70 cm MV A velocity: 90.80 cm/s MV E/A ratio:  0.95 Carlyle Dolly MD Electronically signed by Carlyle Dolly MD Signature Date/Time: 12/02/2019/3:28:22 PM    Final    [place] CORE BIOPSY (LIVER)  Result Date: 11/19/2019 INDICATION: History of breast carcinoma with new multiple liver lesions, lung nodules and bone lesions consistent with recurrent metastatic disease. Biopsy of a liver lesion has been requested to confirm metastatic disease and perform molecular prognostic studies. EXAM: ULTRASOUND GUIDED CORE BIOPSY OF LIVER MEDICATIONS: None. ANESTHESIA/SEDATION: Fentanyl 100  mcg IV; Versed 2.0 mg IV Moderate Sedation Time:  16 minutes. The patient was continuously monitored during the procedure by the interventional radiology nurse under my direct supervision. PROCEDURE: The procedure, risks, benefits, and alternatives were explained to the patient. Questions regarding  the procedure were encouraged and answered. The patient understands and consents to the procedure. A time-out was performed prior to initiating the procedure. The abdominal wall was prepped with chlorhexidine in a sterile fashion, and a sterile drape was applied covering the operative field. A sterile gown and sterile gloves were used for the procedure. Local anesthesia was provided with 1% Lidocaine. Ultrasound was used to localize liver lesions. After choosing a lesion in the right lobe of the liver, a 17 gauge needle was advanced into the liver to the margin of the lesion. Three separate coaxial 18 gauge core biopsy samples were obtained through the lesion. Core biopsy samples were submitted in formalin. Gel-Foam pledgets were advanced through the outer needle as the needle was retracted and removed. Additional ultrasound was performed. COMPLICATIONS: None immediate. FINDINGS: There are multiple hypoechoic solid round and oval masses scattered throughout the liver parenchyma. The largest localized by ultrasound measures approximately 2 cm in diameter and lies within the anterior aspect of the right lobe near the gallbladder fossa. This lesion was sampled yielding solid tissue. IMPRESSION: Ultrasound-guided core biopsy performed of a 2 cm lesion within the right lobe of the liver. Electronically Signed   By: Aletta Edouard M.D.   On: 11/19/2019 15:42     CBC Recent Labs  Lab 12/05/19 0502 12/08/19 1131 12/09/19 0446 12/11/19 0539  WBC 8.8 9.9 8.3 10.4  HGB 8.5* 8.4* 7.8* 8.8*  HCT 28.3* 27.5* 26.5* 28.7*  PLT 296 304 280 306  MCV 84.2 84.9 87.2 83.2  MCH 25.3* 25.9* 25.7* 25.5*  MCHC 30.0 30.5 29.4*  30.7  RDW 19.9* 20.4* 20.8* 20.5*  LYMPHSABS  --  2.3  --   --   MONOABS  --  1.1*  --   --   EOSABS  --  0.1  --   --   BASOSABS  --  0.1  --   --     Chemistries  Recent Labs  Lab 12/05/19 0502 12/08/19 1131 12/09/19 0446 12/11/19 0539  NA 136 137 137 136  K 4.0 4.1 3.6 2.6*  CL 107 107 106 100  CO2 19* 19* 23 23  GLUCOSE 85 120* 150* 134*  BUN _0 5*  CREATININE 0.62 1.25* 0.74 0.59  CALCIUM 8.1* 9.4 8.9 8.9  MG 1.8  --   --   --   AST 117* 73*  --  165*  ALT 47* 39  --  70*  ALKPHOS 184* 206*  --  217*  BILITOT 0.6 0.8  --  1.0   ------------------------------------------------------------------------------------------------------------------ No results for input(s): CHOL, HDL, LDLCALC, TRIG, CHOLHDL, LDLDIRECT in the last 72 hours.  Lab Results  Component Value Date   HGBA1C 6.7 (H) 11/29/2019   ------------------------------------------------------------------------------------------------------------------ No results for input(s): TSH, T4TOTAL, T3FREE, THYROIDAB in the last 72 hours.  Invalid input(s): FREET3 ------------------------------------------------------------------------------------------------------------------ No results for input(s): VITAMINB12, FOLATE, FERRITIN, TIBC, IRON, RETICCTPCT in the last 72 hours.  Coagulation profile No results for input(s): INR, PROTIME in the last 168 hours.  No results for input(s): DDIMER in the last 72 hours.  Cardiac Enzymes No results for input(s): CKMB, TROPONINI, MYOGLOBIN in the last 168 hours.  Invalid input(s): CK ------------------------------------------------------------------------------------------------------------------ No results found for: BNP   Kathie Dike M.D on 12/11/2019 at 8:20 PM  Go to [email or web address] - for contact info  [facility] - Office  [phone]

## 2019-12-11 NOTE — Plan of Care (Signed)
  Problem: Education: Goal: Knowledge of General Education information will improve Description Including pain rating scale, medication(s)/side effects and non-pharmacologic comfort measures Outcome: Progressing   Problem: Health Behavior/Discharge Planning: Goal: Ability to manage health-related needs will improve Outcome: Progressing   

## 2019-12-11 NOTE — Progress Notes (Signed)
CRITICAL VALUE ALERT  Critical Value:  Potassium 2.6  Date & Time Notied:  12/11/19 at 0700  Provider Notified: Dr. Roderic Palau  Orders Received/Actions taken: awaiting instructions

## 2019-12-12 ENCOUNTER — Other Ambulatory Visit (HOSPITAL_COMMUNITY): Payer: Medicare Other

## 2019-12-12 ENCOUNTER — Ambulatory Visit (HOSPITAL_COMMUNITY): Payer: Medicare Other | Admitting: Oncology

## 2019-12-12 DIAGNOSIS — E43 Unspecified severe protein-calorie malnutrition: Secondary | ICD-10-CM

## 2019-12-12 LAB — BASIC METABOLIC PANEL
Anion gap: 12 (ref 5–15)
BUN: 5 mg/dL — ABNORMAL LOW (ref 8–23)
CO2: 23 mmol/L (ref 22–32)
Calcium: 8.8 mg/dL — ABNORMAL LOW (ref 8.9–10.3)
Chloride: 101 mmol/L (ref 98–111)
Creatinine, Ser: 0.68 mg/dL (ref 0.44–1.00)
GFR, Estimated: >60 mL/min (ref >60)
Glucose, Bld: 89 mg/dL (ref 70–99)
Potassium: 3.2 mmol/L — ABNORMAL LOW (ref 3.5–5.1)
Sodium: 136 mmol/L (ref 135–145)

## 2019-12-12 LAB — GLUCOSE, CAPILLARY
Glucose-Capillary: 87 mg/dL (ref 70–99)
Glucose-Capillary: 115 mg/dL — ABNORMAL HIGH (ref 70–99)
Glucose-Capillary: 90 mg/dL (ref 70–99)

## 2019-12-12 LAB — MAGNESIUM: Magnesium: 1.4 mg/dL — ABNORMAL LOW (ref 1.7–2.4)

## 2019-12-12 MED ORDER — AMITRIPTYLINE HCL 10 MG PO TABS: 10.0000 mg | ORAL_TABLET | Freq: Every day | ORAL | Status: AC

## 2019-12-12 MED ORDER — MAGNESIUM OXIDE 400 (241.3 MG) MG PO TABS: 400.0000 mg | ORAL_TABLET | Freq: Two times a day (BID) | ORAL | Status: DC

## 2019-12-12 MED ORDER — POTASSIUM CHLORIDE CRYS ER 20 MEQ PO TBCR
40.0000 meq | EXTENDED_RELEASE_TABLET | Freq: Once | ORAL | Status: AC
  Administered 2019-12-12: 40 meq via ORAL
  Filled 2019-12-12: qty 2

## 2019-12-12 MED ORDER — MAGNESIUM OXIDE 400 (241.3 MG) MG PO TABS: 400.0000 mg | ORAL_TABLET | Freq: Two times a day (BID) | ORAL | Status: AC

## 2019-12-12 MED ORDER — METOPROLOL TARTRATE 50 MG PO TABS: 50.0000 mg | ORAL_TABLET | Freq: Two times a day (BID) | ORAL | Status: AC

## 2019-12-12 MED ORDER — MAGNESIUM SULFATE 4 GM/100ML IV SOLN
4.0000 g | Freq: Once | INTRAVENOUS | Status: AC
  Administered 2019-12-12: 4 g via INTRAVENOUS
  Filled 2019-12-12: qty 100

## 2019-12-12 MED ORDER — FENTANYL 50 MCG/HR TD PT72: 1.0000 | MEDICATED_PATCH | TRANSDERMAL | 0 refills | Status: DC

## 2019-12-12 MED ORDER — OXYCODONE HCL 5 MG PO TABS: 5.0000 mg | ORAL_TABLET | ORAL | 0 refills | Status: DC | PRN

## 2019-12-12 NOTE — Discharge Summary (Addendum)
Physician Discharge Summary  Krista Doyle XBD:[id number] DOB: 06/14/1960 DOA: 12/08/2019  PCP: [name], MD  Admit date: 12/08/2019 Discharge date: 12/12/2019  Admitted From: SNF Disposition:  SNF  Recommendations for Outpatient Follow-up:  1. Follow up with PCP in 1-2 weeks 2. Please obtain BMP/CBC in one week 3. Follow up with oncology as scheduled for further evaluation/treatment of breast CA  Discharge Condition: stable CODE STATUS:full code Diet recommendation: heart healthy  Brief/Interim Summary: 59 year old female with a history of metastatic breast cancer with mets to bones, liver, lungs.  Patient was recently discharged from the hospital to skilled nursing facility.  She was brought back to the emergency room when she was noted to be lethargic and not responding.  She was found to have accidental opiate overdose.  She has chronic pain from her underlying malignancy and recently had adjustment in her pain medications.  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 Accidental opiate overdose.  The patient did receive Narcan x2 doses with improvement.  Her fentanyl patch has been adjusted.  Oral Dilaudid discontinued as was methocarbamol and Lyrica.  She was started on oxycodone for breakthrough pain is tolerating this well. 2. Stage IV breast cancer with mets to bone, liver, lungs.  Patient will need to follow-up with oncology to see if she is a candidate for further treatments.  She will  need improved functional status prior to receiving any treatments. 3. Moderate protein calorie malnutrition in the setting of underlying malignancy.  Continue on Megace for appetite stimulation 4. Type 2 diabetes.  Lantus was held due to low blood sugars.  She is only on sliding scale insulin.  Blood sugars are stable. 5. Anemia of chronic disease, likely related to malignancy.  No evidence of bleeding.  Overall hemoglobin has been stable 6. Acute kidney injury.  Creatinine elevated at 1.25 on admission.  This has improved with IV fluids. 7. Hypokalemia.  Replaced 8. C2 fracture secondary to underlying bone metastases.  Previously discussed with neurosurgery who recommended c-collar and outpatient follow-up.  No surgical intervention recommended at this time. 9. Generalized weakness.  Seen by physical therapy with recommendation for skilled nursing facility.  Discharge Instructions  Discharge Instructions    Diet - low sodium heart healthy   Complete by: As directed    Increase activity slowly   Complete by: As directed      Allergies as of 12/12/2019      Reactions   Other    Cat/dog dander   Pollen Extract       Medication List    STOP taking these medications   fentaNYL 100 MCG/HR Commonly known as: DURAGESIC Replaced by: fentaNYL 50 MCG/HR   HYDROmorphone 4 MG tablet Commonly known as: DILAUDID   Lantus SoloStar 100 UNIT/ML Solostar Pen Generic drug: insulin glargine   methocarbamol 500 MG tablet Commonly known as: ROBAXIN   pregabalin 100 MG capsule Commonly known as: LYRICA   Semglee 100 UNIT/ML injection Generic drug: insulin glargine     TAKE these medications   albuterol 108 (90 Base)  MCG/ACT inhaler Commonly known as: VENTOLIN HFA Inhale 1 puff into the lungs every 6 (six) hours as needed for wheezing or shortness of breath.   amitriptyline 10 MG tablet Commonly known as: ELAVIL Take 1 tablet (10 mg total) by mouth at bedtime.   atorvastatin 20 MG  tablet Commonly known as: LIPITOR Take 20 mg by mouth at bedtime.   BD Pen Needle Nano 2nd Gen 32G X 4 MM Misc Generic drug: Insulin Pen Needle SMARTSIG:Injection Daily   budesonide-formoterol 160-4.5 MCG/ACT inhaler Commonly known as: SYMBICORT Inhale 2 puffs into the lungs every 12 (twelve) hours as needed (shortness of breath).   fentaNYL 50 MCG/HR Commonly known as: Bates 1 patch onto the skin every 3 (three) days. Start taking on: December 14, 2019 Replaces: fentaNYL 100 MCG/HR   Lactulose 20 GM/30ML Soln Please take 30 ml every 3 hours until you produce a bowel movement. Then take 30 ml at bedtime daily for constipation. What changed:   how much to take  how to take this  when to take this  additional instructions  Another medication with the same name was removed. Continue taking this medication, and follow the directions you see here.   letrozole 2.5 MG tablet Commonly known as: FEMARA Take 1 tablet (2.5 mg total) by mouth daily.   lidocaine-prilocaine cream Commonly known as: EMLA Apply a small amount to port a cath site and cover with plastic wrap 1 hour prior to chemotherapy appointments   loratadine 10 MG tablet Commonly known as: CLARITIN Take 10 mg by mouth daily as needed for allergies.   magnesium oxide 400 (241.3 Mg) MG tablet Commonly known as: MAG-OX Take 1 tablet (400 mg total) by mouth 2 (two) times daily.   meclizine 25 MG tablet Commonly known as: ANTIVERT Take 25 mg by mouth every 12 (twelve) hours as needed for dizziness.   megestrol 20 MG tablet Commonly known as: MEGACE Take 20 mg by mouth 2 (two) times daily.   metoprolol tartrate 50 MG tablet Commonly known as: LOPRESSOR Take 1 tablet (50 mg total) by mouth 2 (two) times daily. What changed:   medication strength  how much to take  Another medication with the same name was removed. Continue taking this medication, and follow the directions you see here.    naloxegol oxalate 25 MG Tabs tablet Commonly known as: Movantik Take 1 tablet (25 mg total) by mouth daily.   ondansetron 4 MG tablet Commonly known as: ZOFRAN Take 4 mg by mouth 2 (two) times daily.   OneTouch Delica Plus [id number] Misc USE TO TEST BLOOD SUGAR 2 TIMES A DAY   OneTouch Verio Reflect w/Device Kit USE TO TEST BLOOD SUGAR 2 TIMES A DAY   OneTouch Verio test strip Generic drug: glucose blood 1 each by Other route 2 (two) times daily.   oxyCODONE 5 MG immediate release tablet Commonly known as: Oxy IR/ROXICODONE Take 1 tablet (5 mg total) by mouth every 4 (four) hours as needed for moderate pain.   polyethylene glycol 17 g packet Commonly known as: MIRALAX / GLYCOLAX Take 17 g by mouth daily.   PreserVision AREDS 2 Caps Take 1 capsule by mouth daily.   prochlorperazine 10 MG tablet Commonly known as: COMPAZINE Take 1 tablet (10 mg total) by mouth every 6 (six) hours as needed (Nausea or vomiting).       Contact information for after-discharge care    [facility] .  Service: Skilled Nursing Contact information: [place] [phone]                 Allergies  Allergen Reactions  . Other     Cat/dog dander  . Pollen Extract     Consultations:  Palliative care   Procedures/Studies: CT Head Wo Contrast  Result Date: 12/08/2019 CLINICAL DATA:  Mental status changes metastatic breast cancer EXAM: CT HEAD WITHOUT CONTRAST TECHNIQUE: Contiguous axial images were obtained from the base of the skull through the vertex without intravenous contrast. COMPARISON:  11/29/2019 FINDINGS: Brain: Stable age related atrophy pattern without acute intracranial hemorrhage, acute infarction, new mass lesion, midline shift, herniation, hydrocephalus, or extra-axial fluid collection. No focal mass effect or edema. Cisterns are patent. No cerebellar abnormality. Vascular: No  hyperdense vessel or unexpected calcification. Skull: Similar mottled irregular lytic skull lesions most pronounced in the bifrontal areas, right parietal area, right skull base and clivus. Associated right frontal dural thickening/involvement without change. Sinuses/Orbits: No acute finding. Other: None. IMPRESSION: No acute intracranial abnormality by noncontrast CT. Stable calvarial osseous metastases. Electronically Signed   By: Jerilynn Mages.  Shick M.D.   On: 12/08/2019 11:57   CT Head Wo Contrast  Result Date: 11/29/2019 CLINICAL DATA:  Delirium EXAM: CT HEAD WITHOUT CONTRAST TECHNIQUE: Contiguous axial images were obtained from the base of the skull through the vertex without intravenous contrast. COMPARISON:  None. FINDINGS: Brain: No acute infarct or intracranial hemorrhage. No mass lesion. No midline shift, ventriculomegaly or extra-axial fluid collection. Vascular: No hyperdense vessel or unexpected calcification. Carotid siphon atherosclerotic calcifications. Skull: Mottled, heterogenous appearance of the bifrontal, right parietal calvarium and clivus. There is a 1.6 cm right calvarial soft tissue focus with dehiscence of the bone (2:18). Sinuses/Orbits: Normal orbits. Clear paranasal sinuses. No mastoid effusion. Other: Asymmetric prominence of the right temporalis muscle (2:12). IMPRESSION: No acute infarct or intracranial hemorrhage. Right frontal calvarial soft tissue, asymmetric prominence of the right temporalis muscle and calvarial/clivus heterogeneity is concerning for metastases. MRI head with and without contrast is recommended for further evaluation. Electronically Signed   By: Primitivo Gauze M.D.   On: 11/29/2019 16:37   CT SOFT TISSUE NECK W CONTRAST  Result Date: 12/01/2019 CLINICAL DATA:  Epiglottitis or tonsillitis suspected.  Neck pain. EXAM: CT NECK WITH CONTRAST TECHNIQUE: Multidetector CT imaging of the neck was performed using the standard protocol following the bolus  administration of intravenous contrast. CONTRAST:  1mL OMNIPAQUE IOHEXOL 350 MG/ML SOLN COMPARISON:  11/29/2019 CT and MRI head.  Concurrent CTA chest. FINDINGS: Pharynx and larynx: Clear nasopharynx. Normal appearance of the epiglottis. Partial effacement of the left vallecula and piriform sinus, likely secondary to neck positioning. The vallecula and piriform sinuses are otherwise unremarkable. No vocal cord paralysis. Small retropharyngeal effusion spanning the C2-C5 levels. Salivary glands: No inflammation, mass, or stone. Thyroid: Normal. Lymph nodes: Diffusely prominent subcentimeter cervical nodes. Vascular: Normal intravascular enhancement. Limited intracranial: Please see recent CT and MRI head. Visualized orbits: Normal orbits. Mastoids and visualized paranasal sinuses: Pneumatized. Skeleton: Diffuse heterogeneity of the bone marrow predominantly involving the cervical spine at the C1-2 and C6-T1 levels. There is multilevel involvement of the posterior elements. Vertebral body heights are preserved. Perivertebral inflammatory changes most prominent at the C2 level. Please see recent CT and MRI head for better evaluation of dominant clival and multifocal calvarial osseous metastases. Redemonstration of anterior left TMJ metastasis. Upper chest: Partially imaged pulmonary metastases. Please see concurrent CTA chest for additional findings below  the thoracic inlet. Other: None. IMPRESSION: No evidence of epiglottitis or tonsillitis. Diffuse osseous metastases with prominent involvement of the cervical spine. Vertebral body heights are preserved however cannot exclude pending pathologic fracture. Perivertebral inflammatory changes most prominent at the atlantoaxial junction with small retropharyngeal effusion. Cannot exclude metastatic soft tissue involvement. MRI cervical spine is recommended for better evaluation. Partially imaged pulmonary metastases. Please see concurrent CTA chest for additional findings  below the thoracic inlet. These results will be called to the ordering clinician or representative by the Radiologist Assistant, and communication documented in the PACS or [employer]. Electronically Signed   By: Stana Bunting M.D.   On: 12/01/2019 19:30   CT Chest W Contrast  Result Date: 11/13/2019 CLINICAL DATA:  Breast cancer, recent weight loss and decreased appetite. Liver lesions on recent CT abdomen pelvis. EXAM: CT CHEST WITH CONTRAST TECHNIQUE: Multidetector CT imaging of the chest was performed during intravenous contrast administration. CONTRAST:  85mL OMNIPAQUE IOHEXOL 300 MG/ML  SOLN COMPARISON:  CT abdomen pelvis 11/02/2019, MR lumbar spine 10/24/2019. FINDINGS: Cardiovascular: Extensive mural thickening and severe luminal narrowing involving the left subclavian artery. Heart size normal. No pericardial effusion. Mediastinum/Nodes: Mildly hypodense left thyroid nodule measures 1.3 cm. No follow-up recommended (ref: J Am Coll Radiol. 2015 Feb;12(2): 143-50).No pathologically enlarged mediastinal lymph nodes. Left hilar lymph node measures 1.4 x 1.6 cm. Right axillary lymph nodes measure up to 8 mm. Surgical clips in the left axilla. Soft tissue mass in the epicardial fat along the cardiac apex measures 2.0 x 2.7 cm (2/102). Esophagus is grossly unremarkable. Lungs/Pleura: Diffuse perilymphatic nodularity bilaterally. Extensive pleural/subpleural nodularity in the left hemithorax. Index subpleural nodule in the anterior left upper lobe measures 1.1 x 1.9 cm (4/50). No pleural fluid. Airway is unremarkable. Upper Abdomen: Multiple heterogeneous thick-walled lesions throughout the liver, seen in their entirety on 11/02/2019. Index lesion in the dome of the liver measures 1.9 x 2.1 cm (2/107). Visualized portion of the gallbladder is unremarkable. Slight nodular thickening of both adrenal glands. Subcentimeter low-attenuation lesion in the left kidney is likely a cyst. Visualized portions  of the kidneys, spleen, pancreas, stomach and bowel are grossly unremarkable. Periportal lymph nodes measure up to 11 mm. Gastrohepatic ligament lymph nodes, 5 mm. Musculoskeletal: Mottled sclerotic metastases are seen throughout the visualized osseous structures. Healing or healed pathologic right rib fractures. Pathologic superior endplate compression fracture involving T12. Nodular soft tissue in the lateral left breast measures 1.1 x 1.7 cm (2/60). IMPRESSION: 1. Widespread pleuroparenchymal, left hilar, hepatic and osseous metastatic disease. Lateral left breast mass may be postoperative in etiology. Disease recurrence cannot be excluded. 2. Extensive mural thickening and severe luminal narrowing involving the left subclavian artery. Electronically Signed   By: Leanna Battles M.D.   On: 11/13/2019 09:54   CT ANGIO CHEST PE W OR WO CONTRAST  Result Date: 12/01/2019 CLINICAL DATA:  PE suspected, low/intermediate prob, positive D-dimer History of metastatic breast cancer. EXAM: CT ANGIOGRAPHY CHEST WITH CONTRAST TECHNIQUE: Multidetector CT imaging of the chest was performed using the standard protocol during bolus administration of intravenous contrast. Multiplanar CT image reconstructions and MIPs were obtained to evaluate the vascular anatomy. CONTRAST:  20mL OMNIPAQUE IOHEXOL 350 MG/ML SOLN COMPARISON:  Chest CT 11/12/2019, thoracic spine MRI 11/18/2019 FINDINGS: Cardiovascular: There are no filling defects within the pulmonary arteries to suggest pulmonary embolus. Left hilar lymph node is enlarging in causing increasing mass effect on lingular pulmonary artery but no evidence of invasion. Common origin of brachiocephalic and left  common carotid artery, variant arch anatomy. Extensive mural thickening with severe luminal narrowing involving the proximal left subclavian artery is unchanged from recent exam. There is no aortic dissection or acute aortic findings. The heart is normal in size. No pericardial  effusion. Mediastinum/Nodes: Left hilar lymph node has slightly increased in size currently measuring 18 x 15 mm, previously 16 x 14 mm. Soft tissue mass in the epicardial fat along the cardiac apex measures 3.3 x 2.1 cm (series 5, image 68), previously 2.7 x 2.0 cm. No esophageal wall thickening. Previous left thyroid nodule is not well-defined on the current exam due to streak artifact in the right subclavian vein from dense IV contrast. Surgical clips in the left axilla. Previous small right axillary nodes are partially obscured by dense IV contrast. Lungs/Pleura: Extensive bilateral pulmonary nodules are again seen, both random and pleural/subpleural. Majority of these are stable from recent prior, however largest nodule currently measures 2.2 x 1.2 cm, series 7, image 51, previously 1.9 x 1.1 cm. A small right pleural effusion is new, with adjacent compressive atelectasis. No septal thickening or findings of pulmonary edema. Upper Abdomen: Known liver lesions on prior exam are less well-defined given phase of IV contrast. Musculoskeletal: Diffuse osseous metastatic disease throughout the thoracic osseous structures lateral left breast soft tissue nodularity measures 15 x 12 mm, series 5, image 49. With lytic and blastic osseous lesions. Callus formation about right anterior ribs fractures, also seen on previous. Review of the MIP images confirms the above findings. IMPRESSION: 1. No pulmonary embolus. 2. New small right pleural effusion with adjacent compressive atelectasis. 3. Extensive bilateral pulmonary nodules consistent with metastatic disease. Majority of these are stable from recent prior, however largest pulmonary nodule has increased in size from prior. 4. Left hilar lymph node has slightly increased in size from recent prior. Soft tissue mass in the epicardial fat along the left cardiac apex has increased in size. 5. Diffuse osseous metastatic disease. 6. Left breast soft tissue nodularity laterally  again seen. 7. Known liver lesions on prior exam are less well-defined on the current exam given phase of IV contrast. 8. Extensive mural thickening with severe luminal narrowing involving the proximal left subclavian artery is unchanged from recent prior. Electronically Signed   By: Keith Rake M.D.   On: 12/01/2019 19:20   MR Brain W and Wo Contrast  Result Date: 11/29/2019 CLINICAL DATA:  Dizziness, nonspecific.  Brain mass or lesion. EXAM: MRI HEAD WITHOUT AND WITH CONTRAST TECHNIQUE: Multiplanar, multiecho pulse sequences of the brain and surrounding structures were obtained without and with intravenous contrast. CONTRAST:  22mL GADAVIST GADOBUTROL 1 MMOL/ML IV SOLN COMPARISON:  11/29/2019 head CT. FINDINGS: Brain: No acute infarct. No acute intracranial hemorrhage. No midline shift, ventriculomegaly or extra-axial fluid collection. Diffuse dural thickening overlying the right cerebral convexity. Focal dural thickening overlying the left parietal lobe (18:117). 1.5 x 0.5 cm right dural-based lesion overlying the temporal convexity (18:67). There is also dural thickening along the clivus. Vascular: Preserved major intracranial flow voids. Skull and upper cervical spine: Multifocal T2 hyperintense enhancing calvarial lesions reflect osseous metastases. Enhancing 1.8 cm right frontal calvarial soft tissue (15:35) with dehiscence of the overlying cortex. 2.6 x 1.8 cm clival metastasis (15:10). Smaller enhancing osseous lesions are also seen involving the bilateral sphenoid wings and occipital condyles. Sinuses/Orbits: Normal orbits clear paranasal sinuses. Trace right mastoid effusion. Other: 1.4 x 0.8 cm enhancing soft tissue involving the left TMJ (18:40). Enhancing soft tissue infiltration involving the right temporalis  muscle which is asymmetrically thickened (18:94). IMPRESSION: Dural-based metastatic disease demonstrating right predominance. Focal plaque-like dural lesions measuring up to 1.5 x 0.5  cm overlie the right temporal and left parietal convexities. Multifocal osseous metastases with dominant lesions involving the clivus, right frontal calvarium and left TMJ. Metastatic involvement of the right temporalis muscle. Electronically Signed   By: Stana Bunting M.D.   On: 11/29/2019 19:25   MR CERVICAL SPINE W WO CONTRAST  Result Date: 12/02/2019 CLINICAL DATA:  Metastatic breast cancer with possible pathologic fracture in the cervical spine. EXAM: MRI CERVICAL SPINE WITHOUT AND WITH CONTRAST TECHNIQUE: Multiplanar and multiecho pulse sequences of the cervical spine, to include the craniocervical junction and cervicothoracic junction, were obtained without and with intravenous contrast. CONTRAST:  80mL GADAVIST GADOBUTROL 1 MMOL/ML IV SOLN COMPARISON:  Neck CT 12/01/2019. Brain MRI 11/29/2019. Thoracic spine MRI 11/18/2019. FINDINGS: Alignment: Minimal retrolisthesis of C5 on C6 and C6 on C7. Vertebrae: Diffusely abnormal bone marrow signal throughout the cervical spine, included upper thoracic spine, and skull base consistent with known widespread osseous metastatic disease. Thin curvilinear region of low signal through the base of the dens suggestive of a nondisplaced fracture with possible extension more laterally in the C2 vertebral body. Extra osseous tumor extension about the C2 anterior and posterior elements. Cord: Normal signal. Posterior Fossa, vertebral arteries, paraspinal tissues: Preserved vertebral artery flow voids. Small prevertebral effusion. Previously described clival metastasis with overlying dural thickening extending into the upper cervical spine. No masslike cervical epidural tumor. Disc levels: Cervical disc degeneration greatest at C5-6 where a broad-based posterior disc osteophyte complex and infolding of the ligamentum flavum result in mild-to-moderate spinal stenosis and mild-to-moderate right and severe left neural foraminal stenosis. Disc bulging and mild spurring at  C6-7 resulting in moderate right and mild left neural foraminal stenosis without significant spinal stenosis. IMPRESSION: 1. Widespread osseous metastatic disease. 2. Suspected nondisplaced pathologic fracture involving the C2 vertebral body and base of dens. 3. 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 No masslike cervical dural thickening/epidural tumor. 4. Cervical disc degeneration, worst at C5-6 where there is mild-to-moderate spinal stenosis and severe left neural foraminal stenosis. These results will be called to the ordering clinician or representative by the Radiologist Assistant, and communication documented in the PACS or [employer]. Electronically Signed   By: Sebastian Ache M.D.   On: 12/02/2019 17:01   MR Thoracic Spine W Wo Contrast  Result Date: 11/18/2019 CLINICAL DATA:  Breast cancer EXAM: MRI THORACIC WITHOUT AND WITH CONTRAST TECHNIQUE: Multiplanar and multiecho pulse sequences of the thoracic spine were obtained without and with intravenous contrast. CONTRAST:  34mL GADAVIST GADOBUTROL 1 MMOL/ML IV SOLN COMPARISON:  10/24/2019 lumbar spine MRI FINDINGS: Cervical Localizer sequence is motion degraded. Therefore counting may not be accurate. Alignment: Trace retrolisthesis at T10-T11. Vertebrae: Heterogeneous STIR hyperintensity and enhancement throughout the thoracic spine. Mild compression deformity of T12 is unchanged since prior lumbar MRI. There is additional endplate irregularity and mild loss of height at T9 and T10. There is no significant epidural extension. Cord:  No abnormal signal.  No abnormal intrathecal enhancement. Paraspinal and other soft tissues: Extra-spinal metastatic disease is partially imaged and present on prior cross-sectional imaging. Disc levels: Mild multilevel degenerative disc disease and facet arthropathy. There is no high-grade degenerative stenosis. IMPRESSION: Diffuse osseous  metastatic disease. No significant epidural extension. No definite acute compression deformity. Electronically Signed   By: Guadlupe Spanish M.D.   On:  11/18/2019 14:55   DG Chest Portable 1 View  Result Date: 12/08/2019 CLINICAL DATA:  Unresponsive EXAM: PORTABLE CHEST 1 VIEW COMPARISON:  Portable exam 0949 hours compared to 11/29/2019 FINDINGS: Normal heart size, mediastinal contours, and pulmonary vascularity. Central peribronchial thickening. Questionable nodularity at lateral LEFT upper and mid thorax unchanged from CT. Interstitial prominence in LEFT greater than RIGHT lungs. No segmental consolidation, pleural effusion, or pneumothorax. Gaseous distention of stomach. No acute infiltrate, pleural effusion, or pneumothorax. Osseous demineralization. IMPRESSION: Diffuse interstitial prominence greater on LEFT with persistent nodularity at the lateral aspect of the LEFT apex corresponding to nodularity on CT. No new abnormalities identified. Electronically Signed   By: Ulyses Southward M.D.   On: 12/08/2019 10:31   DG Chest Port 1 View  Result Date: 11/29/2019 CLINICAL DATA:  Possible sepsis, metastatic breast cancer EXAM: PORTABLE CHEST 1 VIEW COMPARISON:  05/07/2019 FINDINGS: Increased interstitial prominence with patchy density. No pleural effusion or pneumothorax. Cardiomediastinal contours are within normal limits with normal heart size. IMPRESSION: Interstitial prominence and patchy density, at least a component of which likely reflects metastatic disease seen on recent chest CT. There may be superimposed edema or atypical infection. Electronically Signed   By: Guadlupe Spanish M.D.   On: 11/29/2019 14:45   ECHOCARDIOGRAM COMPLETE  Result Date: 12/02/2019    ECHOCARDIOGRAM REPORT   Patient Name:   MEHA VIDRINE Date of Exam: 12/02/2019 Medical Rec #:  [id number]        Height:       63.0 in Accession #:    [id number]       Weight:       114.2 lb Date of Birth:  Aug 20, 1960       BSA:          1.524 m  Patient Age:    59 years         BP:           131/68 mmHg Patient Gender: F                HR:           99 bpm. [facility] Procedure: 2D Echo, Cardiac Doppler and Color Doppler Indications:    R94.31 Abnormal EKG; R00.0 Tachycardia  History:        Patient has no prior history of Echocardiogram examinations.                 Abnormal ECG. Breast cancer. Chemotherapy.  Sonographer:    Sheralyn Boatman RDCS (AE) Referring Phys: [phone] [facility] Caia Lofaro  Sonographer Comments: Technically difficult study due to poor echo windows. Rib artifact. Attempted to turn. Images off-axis. IMPRESSIONS  1. Left ventricular ejection fraction, by estimation, is 60 to 65%. The left ventricle has normal function. The left ventricle has no regional wall motion abnormalities. Left ventricular diastolic parameters are consistent with Grade I diastolic dysfunction (impaired relaxation).  2. Right ventricular systolic function is normal. The right ventricular size is normal.  3. The mitral valve is normal in structure. No evidence of mitral valve regurgitation. No evidence of mitral stenosis.  4. The aortic valve is tricuspid. Aortic valve regurgitation is not visualized. No aortic stenosis is present.  5. The inferior vena cava is normal in size with greater than 50% respiratory variability, suggesting right atrial pressure of 3 mmHg. FINDINGS  Left Ventricle: Left ventricular ejection fraction, by estimation, is 60 to 65%. The left ventricle has normal function. The left ventricle has no  regional wall motion abnormalities. The left ventricular internal cavity size was normal in size. There is  no left ventricular hypertrophy. Left ventricular diastolic parameters are consistent with Grade I diastolic dysfunction (impaired relaxation). Normal left ventricular filling pressure. Right Ventricle: The right ventricular size is normal. No increase in right ventricular wall thickness. Right ventricular systolic function is normal. Left  Atrium: Left atrial size was normal in size. Right Atrium: Right atrial size was normal in size. Pericardium: There is no evidence of pericardial effusion. Mitral Valve: The mitral valve is normal in structure. No evidence of mitral valve regurgitation. No evidence of mitral valve stenosis. Tricuspid Valve: The tricuspid valve is normal in structure. Tricuspid valve regurgitation is not demonstrated. No evidence of tricuspid stenosis. Aortic Valve: The aortic valve is tricuspid. Aortic valve regurgitation is not visualized. No aortic stenosis is present. Aortic valve mean gradient measures 3.1 mmHg. Aortic valve peak gradient measures 6.4 mmHg. Aortic valve area, by VTI measures 1.83 cm. Pulmonic Valve: The pulmonic valve was not well visualized. Pulmonic valve regurgitation is not visualized. No evidence of pulmonic stenosis. Aorta: The aortic root is normal in size and structure. Pulmonary Artery: Indeterminant PASP, inadequate TR jet. Venous: The inferior vena cava is normal in size with greater than 50% respiratory variability, suggesting right atrial pressure of 3 mmHg. IAS/Shunts: No atrial level shunt detected by color flow Doppler.  LEFT VENTRICLE PLAX 2D LVIDd:         3.36 cm     Diastology LVIDs:         2.16 cm     LV e' medial:   6.96 cm/s LV PW:         1.03 cm     LV E/e' medial: 12.4 LV IVS:        0.94 cm LVOT diam:     1.70 cm LV SV:         38 LV SV Index:   25 LVOT Area:     2.27 cm  LV Volumes (MOD) LV vol d, MOD A2C: 55.0 ml LV vol d, MOD A4C: 32.5 ml LV vol s, MOD A2C: 18.4 ml LV vol s, MOD A4C: 11.3 ml LV SV MOD A2C:     36.6 ml LV SV MOD A4C:     32.5 ml LV SV MOD BP:      32.0 ml RIGHT VENTRICLE RV S prime:     11.30 cm/s TAPSE (M-mode): 1.5 cm LEFT ATRIUM           Index      RIGHT ATRIUM          Index LA diam:      2.20 cm 1.44 cm/m RA Area:     7.17 cm LA Vol (A4C): 14.0 ml 9.19 ml/m RA Volume:   12.20 ml 8.01 ml/m  AORTIC VALVE AV Area (Vmax):    1.81 cm AV Area (Vmean):   1.59  cm AV Area (VTI):     1.83 cm AV Vmax:           126.43 cm/s AV Vmean:          81.029 cm/s AV VTI:            0.208 m AV Peak Grad:      6.4 mmHg AV Mean Grad:      3.1 mmHg LVOT Vmax:         101.00 cm/s LVOT Vmean:        56.600 cm/s LVOT  VTI:          0.168 m LVOT/AV VTI ratio: 0.81  AORTA Ao Root diam: 2.80 cm Ao Asc diam:  3.20 cm MITRAL VALVE MV Area (PHT): 4.21 cm    SHUNTS MV Decel Time: 180 msec    Systemic VTI:  0.17 m MV E velocity: 86.50 cm/s  Systemic Diam: 1.70 cm MV A velocity: 90.80 cm/s MV E/A ratio:  0.95 Carlyle Dolly MD Electronically signed by Carlyle Dolly MD Signature Date/Time: 12/02/2019/3:28:22 PM    Final    [place] CORE BIOPSY (LIVER)  Result Date: 11/19/2019 INDICATION: History of breast carcinoma with new multiple liver lesions, lung nodules and bone lesions consistent with recurrent metastatic disease. Biopsy of a liver lesion has been requested to confirm metastatic disease and perform molecular prognostic studies. EXAM: ULTRASOUND GUIDED CORE BIOPSY OF LIVER MEDICATIONS: None. ANESTHESIA/SEDATION: Fentanyl 100 mcg IV; Versed 2.0 mg IV Moderate Sedation Time:  16 minutes. The patient was continuously monitored during the procedure by the interventional radiology nurse under my direct supervision. PROCEDURE: The procedure, risks, benefits, and alternatives were explained to the patient. Questions regarding the procedure were encouraged and answered. The patient understands and consents to the procedure. A time-out was performed prior to initiating the procedure. The abdominal wall was prepped with chlorhexidine in a sterile fashion, and a sterile drape was applied covering the operative field. A sterile gown and sterile gloves were used for the procedure. Local anesthesia was provided with 1% Lidocaine. Ultrasound was used to localize liver lesions. After choosing a lesion in the right lobe of the liver, a 17 gauge needle was advanced into the liver to the margin of the lesion.  Three separate coaxial 18 gauge core biopsy samples were obtained through the lesion. Core biopsy samples were submitted in formalin. Gel-Foam pledgets were advanced through the outer needle as the needle was retracted and removed. Additional ultrasound was performed. COMPLICATIONS: None immediate. FINDINGS: There are multiple hypoechoic solid round and oval masses scattered throughout the liver parenchyma. The largest localized by ultrasound measures approximately 2 cm in diameter and lies within the anterior aspect of the right lobe near the gallbladder fossa. This lesion was sampled yielding solid tissue. IMPRESSION: Ultrasound-guided core biopsy performed of a 2 cm lesion within the right lobe of the liver. Electronically Signed   By: Aletta Edouard M.D.   On: 11/19/2019 15:42      Subjective: Pain is reasonably controlled, no complaints  Discharge Exam: Vitals:   12/11/19 1504 12/11/19 1711 12/11/19 2205 12/12/19 0425  BP: 140/86 136/84 (!) 152/88 132/79  Pulse: 100 99 (!) 109 (!) 103  Resp: $Remo'20 17 16 16  'khPad$ Temp: 99.6 F (37.6 C) 99.4 F (37.4 C) 99.2 F (37.3 C) 98.6 F (37 C)  TempSrc: Oral Oral Oral Oral  SpO2: 100% 100% 100% 100%  Weight:      Height:        General: Pt is alert, awake, not in acute distress, C collar in place Cardiovascular: RRR, S1/S2 +, no rubs, no gallops Respiratory: CTA bilaterally, no wheezing, no rhonchi Abdominal: Soft, NT, ND, bowel sounds + Extremities: no edema, no cyanosis    The results of significant diagnostics from this hospitalization (including imaging, microbiology, ancillary and laboratory) are listed below for reference.     Microbiology: Recent Results (from the past 240 hour(s))  Respiratory Panel by RT PCR (Flu A&B, Covid) - Nasopharyngeal Swab     Status: None   Collection Time: 12/05/19  3:28  PM   Specimen: Nasopharyngeal Swab  Result Value Ref Range Status   SARS Coronavirus 2 by RT PCR NEGATIVE NEGATIVE Final    Comment:  (NOTE) SARS-CoV-2 target nucleic acids are NOT DETECTED.  The SARS-CoV-2 RNA is generally detectable in upper respiratoy specimens during the acute phase of infection. The lowest concentration of SARS-CoV-2 viral copies this assay can detect is 131 copies/mL. A negative result does not preclude SARS-Cov-2 infection and should not be used as the sole basis for treatment or other patient management decisions. A negative result may occur with  improper specimen collection/handling, submission of specimen other than nasopharyngeal swab, presence of viral mutation(s) within the areas targeted by this assay, and inadequate number of viral copies (<131 copies/mL). A negative result must be combined with clinical observations, patient history, and epidemiological information. The expected result is Negative.  Fact Sheet for Patients:  https://www.moore.com/  Fact Sheet for Healthcare Providers:  https://www.young.biz/  This test is no t yet approved or cleared by the [place] FDA and  has been authorized for detection and/or diagnosis of SARS-CoV-2 by FDA under an Emergency Use Authorization (EUA). This EUA will remain  in effect (meaning this test can be used) for the duration of the COVID-19 declaration under Section 564(b)(1) of the Act, 21 U.S.C. section 360bbb-3(b)(1), unless the authorization is terminated or revoked sooner.     Influenza A by PCR NEGATIVE NEGATIVE Final   Influenza B by PCR NEGATIVE NEGATIVE Final    Comment: (NOTE) The Xpert Xpress SARS-CoV-2/FLU/RSV assay is intended as an aid in  the diagnosis of influenza from Nasopharyngeal swab specimens and  should not be used as a sole basis for treatment. Nasal washings and  aspirates are unacceptable for Xpert Xpress SARS-CoV-2/FLU/RSV  testing.  Fact Sheet for Patients: https://www.moore.com/  Fact Sheet for Healthcare  Providers: https://www.young.biz/  This test is not yet approved or cleared by the [place] FDA and  has been authorized for detection and/or diagnosis of SARS-CoV-2 by  FDA under an Emergency Use Authorization (EUA). This EUA will remain  in effect (meaning this test can be used) for the duration of the  Covid-19 declaration under Section 564(b)(1) of the Act, 21  U.S.C. section 360bbb-3(b)(1), unless the authorization is  terminated or revoked. Performed at [facility], [address], [place], [place] [address]   Respiratory Panel by RT PCR (Flu A&B, Covid) - Nasopharyngeal Swab     Status: None   Collection Time: 12/08/19 11:31 AM   Specimen: Nasopharyngeal Swab  Result Value Ref Range Status   SARS Coronavirus 2 by RT PCR NEGATIVE NEGATIVE Final    Comment: (NOTE) SARS-CoV-2 target nucleic acids are NOT DETECTED.  The SARS-CoV-2 RNA is generally detectable in upper respiratoy specimens during the acute phase of infection. The lowest concentration of SARS-CoV-2 viral copies this assay can detect is 131 copies/mL. A negative result does not preclude SARS-Cov-2 infection and should not be used as the sole basis for treatment or other patient management decisions. A negative result may occur with  improper specimen collection/handling, submission of specimen other than nasopharyngeal swab, presence of viral mutation(s) within the areas targeted by this assay, and inadequate number of viral copies (<131 copies/mL). A negative result must be combined with clinical observations, patient history, and epidemiological information. The expected result is Negative.  Fact Sheet for Patients:  https://www.moore.com/  Fact Sheet for Healthcare Providers:  https://www.young.biz/  This test is no t yet approved or cleared by the [place]  [place] FDA and  has been authorized for detection and/or diagnosis of SARS-CoV-2 by FDA  under an Emergency Use Authorization (EUA). This EUA will remain  in effect (meaning this test can be used) for the duration of the COVID-19 declaration under Section 564(b)(1) of the Act, 21 U.S.C. section 360bbb-3(b)(1), unless the authorization is terminated or revoked sooner.     Influenza A by PCR NEGATIVE NEGATIVE Final   Influenza B by PCR NEGATIVE NEGATIVE Final    Comment: (NOTE) The Xpert Xpress SARS-CoV-2/FLU/RSV assay is intended as an aid in  the diagnosis of influenza from Nasopharyngeal swab specimens and  should not be used as a sole basis for treatment. Nasal washings and  aspirates are unacceptable for Xpert Xpress SARS-CoV-2/FLU/RSV  testing.  Fact Sheet for Patients: PinkCheek.be  Fact Sheet for Healthcare Providers: GravelBags.it  This test is not yet approved or cleared by the [place] FDA and  has been authorized for detection and/or diagnosis of SARS-CoV-2 by  FDA under an Emergency Use Authorization (EUA). This EUA will remain  in effect (meaning this test can be used) for the duration of the  Covid-19 declaration under Section 564(b)(1) of the Act, 21  U.S.C. section 360bbb-3(b)(1), unless the authorization is  terminated or revoked. Performed at [facility], [address] [place], [place] [address]      Labs: BNP (last 3 results) No results for input(s): BNP in the last 8760 hours. Basic Metabolic Panel: Recent Labs  Lab 12/08/19 1131 12/09/19 0446 12/11/19 0539 12/12/19 0419  NA 137 137 136 136  K 4.1 3.6 2.6* 3.2*  CL 107 106 100 101  CO2 19* $Remov'23 23 23  'ZISPyv$ GLUCOSE 120* 150* 134* 89  BUN 23 16 5* 5*  CREATININE 1.25* 0.74 0.59 0.68  CALCIUM 9.4 8.9 8.9 8.8*  MG  --   --   --  1.4*   Liver Function Tests: Recent Labs  Lab 12/08/19 1131 12/11/19 0539  AST 73* 165*  ALT 39 70*  ALKPHOS 206* 217*  BILITOT 0.8 1.0  PROT 8.2* 7.7  ALBUMIN 2.8* 2.5*   No results for  input(s): LIPASE, AMYLASE in the last 168 hours. No results for input(s): AMMONIA in the last 168 hours. CBC: Recent Labs  Lab 12/08/19 1131 12/09/19 0446 12/11/19 0539  WBC 9.9 8.3 10.4  NEUTROABS 6.2  --   --   HGB 8.4* 7.8* 8.8*  HCT 27.5* 26.5* 28.7*  MCV 84.9 87.2 83.2  PLT 304 280 306   Cardiac Enzymes: No results for input(s): CKTOTAL, CKMB, CKMBINDEX, TROPONINI in the last 168 hours. BNP: Invalid input(s): POCBNP CBG: Recent Labs  Lab 12/11/19 1149 12/11/19 1707 12/11/19 2200 12/12/19 0826 12/12/19 1149  GLUCAP 112* 113* 111* 87 115*   D-Dimer No results for input(s): DDIMER in the last 72 hours. Hgb A1c No results for input(s): HGBA1C in the last 72 hours. Lipid Profile No results for input(s): CHOL, HDL, LDLCALC, TRIG, CHOLHDL, LDLDIRECT in the last 72 hours. Thyroid function studies No results for input(s): TSH, T4TOTAL, T3FREE, THYROIDAB in the last 72 hours.  Invalid input(s): FREET3 Anemia work up No results for input(s): VITAMINB12, FOLATE, FERRITIN, TIBC, IRON, RETICCTPCT in the last 72 hours. Urinalysis    Component Value Date/Time   COLORURINE STRAW (A) 11/29/2019 1550   APPEARANCEUR CLEAR 11/29/2019 1550   LABSPEC 1.008 11/29/2019 1550   PHURINE 7.0 11/29/2019 1550   GLUCOSEU NEGATIVE 11/29/2019 1550   HGBUR NEGATIVE 11/29/2019 1550   BILIRUBINUR NEGATIVE  11/29/2019 [place] 11/29/2019 [place] NEGATIVE 11/29/2019 1550   NITRITE NEGATIVE 11/29/2019 [place] 11/29/2019 1550   Sepsis Labs Invalid input(s): PROCALCITONIN,  WBC,  LACTICIDVEN Microbiology Recent Results (from the past 240 hour(s))  Respiratory Panel by RT PCR (Flu A&B, Covid) - Nasopharyngeal Swab     Status: None   Collection Time: 12/05/19  3:28 PM   Specimen: Nasopharyngeal Swab  Result Value Ref Range Status   SARS Coronavirus 2 by RT PCR NEGATIVE NEGATIVE Final    Comment: (NOTE) SARS-CoV-2 target nucleic acids are NOT  DETECTED.  The SARS-CoV-2 RNA is generally detectable in upper respiratoy specimens during the acute phase of infection. The lowest concentration of SARS-CoV-2 viral copies this assay can detect is 131 copies/mL. A negative result does not preclude SARS-Cov-2 infection and should not be used as the sole basis for treatment or other patient management decisions. A negative result may occur with  improper specimen collection/handling, submission of specimen other than nasopharyngeal swab, presence of viral mutation(s) within the areas targeted by this assay, and inadequate number of viral copies (<131 copies/mL). A negative result must be combined with clinical observations, patient history, and epidemiological information. The expected result is Negative.  Fact Sheet for Patients:  PinkCheek.be  Fact Sheet for Healthcare Providers:  GravelBags.it  This test is no t yet approved or cleared by the [place] FDA and  has been authorized for detection and/or diagnosis of SARS-CoV-2 by FDA under an Emergency Use Authorization (EUA). This EUA will remain  in effect (meaning this test can be used) for the duration of the COVID-19 declaration under Section 564(b)(1) of the Act, 21 U.S.C. section 360bbb-3(b)(1), unless the authorization is terminated or revoked sooner.     Influenza A by PCR NEGATIVE NEGATIVE Final   Influenza B by PCR NEGATIVE NEGATIVE Final    Comment: (NOTE) The Xpert Xpress SARS-CoV-2/FLU/RSV assay is intended as an aid in  the diagnosis of influenza from Nasopharyngeal swab specimens and  should not be used as a sole basis for treatment. Nasal washings and  aspirates are unacceptable for Xpert Xpress SARS-CoV-2/FLU/RSV  testing.  Fact Sheet for Patients: PinkCheek.be  Fact Sheet for Healthcare Providers: GravelBags.it  This test is not yet  approved or cleared by the [place] FDA and  has been authorized for detection and/or diagnosis of SARS-CoV-2 by  FDA under an Emergency Use Authorization (EUA). This EUA will remain  in effect (meaning this test can be used) for the duration of the  Covid-19 declaration under Section 564(b)(1) of the Act, 21  U.S.C. section 360bbb-3(b)(1), unless the authorization is  terminated or revoked. Performed at [facility], [address] [place], [place] [address]   Respiratory Panel by RT PCR (Flu A&B, Covid) - Nasopharyngeal Swab     Status: None   Collection Time: 12/08/19 11:31 AM   Specimen: Nasopharyngeal Swab  Result Value Ref Range Status   SARS Coronavirus 2 by RT PCR NEGATIVE NEGATIVE Final    Comment: (NOTE) SARS-CoV-2 target nucleic acids are NOT DETECTED.  The SARS-CoV-2 RNA is generally detectable in upper respiratoy specimens during the acute phase of infection. The lowest concentration of SARS-CoV-2 viral copies this assay can detect is 131 copies/mL. A negative result does not preclude SARS-Cov-2 infection and should not be used as the sole basis for treatment or other patient management decisions. A negative result may occur with  improper specimen collection/handling, submission of specimen  other than nasopharyngeal swab, presence of viral mutation(s) within the areas targeted by this assay, and inadequate number of viral copies (<131 copies/mL). A negative result must be combined with clinical observations, patient history, and epidemiological information. The expected result is Negative.  Fact Sheet for Patients:  PinkCheek.be  Fact Sheet for Healthcare Providers:  GravelBags.it  This test is no t yet approved or cleared by the [place] FDA and  has been authorized for detection and/or diagnosis of SARS-CoV-2 by FDA under an Emergency Use Authorization (EUA). This EUA will remain  in effect  (meaning this test can be used) for the duration of the COVID-19 declaration under Section 564(b)(1) of the Act, 21 U.S.C. section 360bbb-3(b)(1), unless the authorization is terminated or revoked sooner.     Influenza A by PCR NEGATIVE NEGATIVE Final   Influenza B by PCR NEGATIVE NEGATIVE Final    Comment: (NOTE) The Xpert Xpress SARS-CoV-2/FLU/RSV assay is intended as an aid in  the diagnosis of influenza from Nasopharyngeal swab specimens and  should not be used as a sole basis for treatment. Nasal washings and  aspirates are unacceptable for Xpert Xpress SARS-CoV-2/FLU/RSV  testing.  Fact Sheet for Patients: PinkCheek.be  Fact Sheet for Healthcare Providers: GravelBags.it  This test is not yet approved or cleared by the [place] FDA and  has been authorized for detection and/or diagnosis of SARS-CoV-2 by  FDA under an Emergency Use Authorization (EUA). This EUA will remain  in effect (meaning this test can be used) for the duration of the  Covid-19 declaration under Section 564(b)(1) of the Act, 21  U.S.C. section 360bbb-3(b)(1), unless the authorization is  terminated or revoked. Performed at [facility], [address] [place], [place] [address]      Time coordinating discharge: 32mins  SIGNED:   Kathie Dike, MD  [facility] 12/12/2019, 4:38 PM   If 7PM-7AM, please contact night-coverage [email or web address]

## 2019-12-12 NOTE — TOC Transition Note (Signed)
Transition of Care [facility]) - CM/SW Discharge Note   Patient Details  Name: Krista Doyle MRN: [id number] Date of Birth: Feb 01, 1960  [facility] CM/SW Contact:  Boneta Lucks, RN Phone Number: 12/12/2019, 2:27 PM   Clinical Narrative:   Insurance auth received and patient is medically ready to return to [place]. TOC called Cornelia Copa.  Palliative consult completed. Patient is wanting treatment, but to weak at present.  Declining out patient palliative.  Patient and family needs to see oncology to discuss goals of care. Debbie confirmed they are ready for patient back today, TOC to send DC summary, no COVID test needed.    Final next level of care: Skilled Nursing Facility Barriers to Discharge: Barriers Resolved   Patient Goals and CMS Choice Patient states their goals for this hospitalization and ongoing recovery are:: return to SNF CMS Medicare.gov Compare Post Acute Care list provided to:: Patient Represenative (must comment) Choice offered to / list presented to : Spouse  Discharge Placement              Patient chooses bed at: Other - please specify in the comment section below: ([facility]) Patient to be transferred to facility by: EMs Name of family member notified: Cornelia Copa Patient and family notified of of transfer: 12/12/19  Discharge Plan and Services In-house Referral: Clinical Social Work        Readmission Risk Interventions Readmission Risk Prevention Plan 12/06/2019 12/02/2019  Transportation Screening Complete Complete  PCP or Specialist Appt within 3-5 Days Complete -  HRI or Home Care Consult Complete Complete  Social Work Consult for [place] Planning/Counseling Complete Complete  Palliative Care Screening Complete Not Applicable  Medication Review Press photographer) Complete Complete  Some recent data might be hidden

## 2019-12-12 NOTE — Progress Notes (Signed)
Palliative: Chart review completed.  Krista Doyle is planning for short-term rehab, follow-up with outpatient oncology for next steps.  She and her family are not open to outpatient palliative services at this time.  I believe this would be better accepted if recommended by oncology.  Conference with attending, transition of care team, chaplaincy related to patient condition, needs, goals of care.  Would benefit from further goals of care/CODE STATUS discussions.  Plan:   Short-term rehab.  Follow-up with oncology outpatient for next steps.  Continue full scope/full code.  Continue CODE STATUS discussions.  No charge Quinn Axe, NP Palliative Medicine Team Team Phone # [phone] Greater than 50% of this time was spent counseling and coordinating care related to the above assessment and plan.

## 2019-12-20 ENCOUNTER — Ambulatory Visit (HOSPITAL_COMMUNITY): Payer: BC Managed Care – PPO

## 2019-12-20 DIAGNOSIS — E440 Moderate protein-calorie malnutrition: Secondary | ICD-10-CM | POA: Diagnosis not present

## 2019-12-20 DIAGNOSIS — S12101S Unspecified nondisplaced fracture of second cervical vertebra, sequela: Secondary | ICD-10-CM | POA: Diagnosis not present

## 2019-12-20 DIAGNOSIS — C50919 Malignant neoplasm of unspecified site of unspecified female breast: Secondary | ICD-10-CM | POA: Diagnosis not present

## 2019-12-20 DIAGNOSIS — C7800 Secondary malignant neoplasm of unspecified lung: Secondary | ICD-10-CM | POA: Diagnosis not present

## 2019-12-20 NOTE — Progress Notes (Signed)
Nutrition Follow-up:  Patient with metastatic breast cancer.  Patient has been in the hospital with discharge to SNF.    Spoke with patient briefly via phone. Reports that she is being discharged today from SNF to home.  Reports that appetite is good with ensures. Requesting more shakes and son can pick them up today.  Call cut short as staff member came into room.     Anthropometrics:   Weight 114 lb on 11/7 (hospital weight)    INTERVENTION:  Complimentary case of ensure enlive left at front desk of cancer center for son to pick up today.  Patient has contact information    MONITORING, EVALUATION, GOAL: weight trends, intake   NEXT VISIT: Dec 17 phone f/u  Krista Doyle B. [name], [place], [place] Registered Dietitian [phone] (mobile)

## 2019-12-25 ENCOUNTER — Other Ambulatory Visit: Payer: Self-pay

## 2019-12-25 ENCOUNTER — Inpatient Hospital Stay (HOSPITAL_COMMUNITY): Payer: BC Managed Care – PPO | Attending: Hematology

## 2019-12-25 ENCOUNTER — Inpatient Hospital Stay (HOSPITAL_BASED_OUTPATIENT_CLINIC_OR_DEPARTMENT_OTHER): Payer: BC Managed Care – PPO | Admitting: Hematology

## 2019-12-25 ENCOUNTER — Other Ambulatory Visit (HOSPITAL_COMMUNITY): Payer: Self-pay

## 2019-12-25 ENCOUNTER — Inpatient Hospital Stay (HOSPITAL_COMMUNITY): Payer: BC Managed Care – PPO

## 2019-12-25 VITALS — BP 136/85 | HR 112 | Temp 97.3°F | Resp 16 | Wt 107.5 lb

## 2019-12-25 DIAGNOSIS — C7951 Secondary malignant neoplasm of bone: Secondary | ICD-10-CM | POA: Insufficient documentation

## 2019-12-25 DIAGNOSIS — C50412 Malignant neoplasm of upper-outer quadrant of left female breast: Secondary | ICD-10-CM | POA: Diagnosis not present

## 2019-12-25 DIAGNOSIS — Z79899 Other long term (current) drug therapy: Secondary | ICD-10-CM | POA: Diagnosis not present

## 2019-12-25 DIAGNOSIS — C50919 Malignant neoplasm of unspecified site of unspecified female breast: Secondary | ICD-10-CM | POA: Diagnosis not present

## 2019-12-25 DIAGNOSIS — M50322 Other cervical disc degeneration at C5-C6 level: Secondary | ICD-10-CM | POA: Diagnosis not present

## 2019-12-25 DIAGNOSIS — C787 Secondary malignant neoplasm of liver and intrahepatic bile duct: Secondary | ICD-10-CM | POA: Insufficient documentation

## 2019-12-25 DIAGNOSIS — M4802 Spinal stenosis, cervical region: Secondary | ICD-10-CM | POA: Diagnosis not present

## 2019-12-25 DIAGNOSIS — R410 Disorientation, unspecified: Secondary | ICD-10-CM | POA: Diagnosis not present

## 2019-12-25 DIAGNOSIS — R634 Abnormal weight loss: Secondary | ICD-10-CM | POA: Insufficient documentation

## 2019-12-25 DIAGNOSIS — J90 Pleural effusion, not elsewhere classified: Secondary | ICD-10-CM | POA: Diagnosis not present

## 2019-12-25 DIAGNOSIS — M50223 Other cervical disc displacement at C6-C7 level: Secondary | ICD-10-CM | POA: Insufficient documentation

## 2019-12-25 DIAGNOSIS — C7800 Secondary malignant neoplasm of unspecified lung: Secondary | ICD-10-CM | POA: Insufficient documentation

## 2019-12-25 DIAGNOSIS — M5136 Other intervertebral disc degeneration, lumbar region: Secondary | ICD-10-CM | POA: Insufficient documentation

## 2019-12-25 DIAGNOSIS — R000 Tachycardia, unspecified: Secondary | ICD-10-CM | POA: Diagnosis not present

## 2019-12-25 DIAGNOSIS — C7931 Secondary malignant neoplasm of brain: Secondary | ICD-10-CM | POA: Insufficient documentation

## 2019-12-25 DIAGNOSIS — M8580 Other specified disorders of bone density and structure, unspecified site: Secondary | ICD-10-CM | POA: Insufficient documentation

## 2019-12-25 DIAGNOSIS — Z170 Estrogen receptor positive status [ER+]: Secondary | ICD-10-CM | POA: Insufficient documentation

## 2019-12-25 DIAGNOSIS — Z9221 Personal history of antineoplastic chemotherapy: Secondary | ICD-10-CM | POA: Insufficient documentation

## 2019-12-25 DIAGNOSIS — M4312 Spondylolisthesis, cervical region: Secondary | ICD-10-CM | POA: Diagnosis not present

## 2019-12-25 DIAGNOSIS — M2578 Osteophyte, vertebrae: Secondary | ICD-10-CM | POA: Insufficient documentation

## 2019-12-25 DIAGNOSIS — E1140 Type 2 diabetes mellitus with diabetic neuropathy, unspecified: Secondary | ICD-10-CM | POA: Insufficient documentation

## 2019-12-25 DIAGNOSIS — Z79811 Long term (current) use of aromatase inhibitors: Secondary | ICD-10-CM | POA: Insufficient documentation

## 2019-12-25 DIAGNOSIS — R1310 Dysphagia, unspecified: Secondary | ICD-10-CM

## 2019-12-25 DIAGNOSIS — Z95828 Presence of other vascular implants and grafts: Secondary | ICD-10-CM

## 2019-12-25 DIAGNOSIS — G939 Disorder of brain, unspecified: Secondary | ICD-10-CM | POA: Diagnosis not present

## 2019-12-25 DIAGNOSIS — M549 Dorsalgia, unspecified: Secondary | ICD-10-CM | POA: Diagnosis not present

## 2019-12-25 LAB — COMPREHENSIVE METABOLIC PANEL
ALT: 25 U/L (ref 0–44)
AST: 81 U/L — ABNORMAL HIGH (ref 15–41)
Albumin: 3 g/dL — ABNORMAL LOW (ref 3.5–5.0)
Alkaline Phosphatase: 269 U/L — ABNORMAL HIGH (ref 38–126)
Anion gap: 12 (ref 5–15)
BUN: 8 mg/dL (ref 8–23)
CO2: 26 mmol/L (ref 22–32)
Calcium: 10.5 mg/dL — ABNORMAL HIGH (ref 8.9–10.3)
Chloride: 99 mmol/L (ref 98–111)
Creatinine, Ser: 0.92 mg/dL (ref 0.44–1.00)
GFR, Estimated: >60 mL/min (ref >60)
Glucose, Bld: 131 mg/dL — ABNORMAL HIGH (ref 70–99)
Potassium: 4.1 mmol/L (ref 3.5–5.1)
Sodium: 137 mmol/L (ref 135–145)
Total Bilirubin: 0.5 mg/dL (ref 0.3–1.2)
Total Protein: 9.1 g/dL — ABNORMAL HIGH (ref 6.5–8.1)

## 2019-12-25 LAB — CBC WITH DIFFERENTIAL/PLATELET
Abs Immature Granulocytes: 0.07 10*3/uL (ref 0.00–0.07)
Basophils Absolute: 0 10*3/uL (ref 0.0–0.1)
Basophils Relative: 0 %
Eosinophils Absolute: 0.2 10*3/uL (ref 0.0–0.5)
Eosinophils Relative: 3 %
HCT: 31.5 % — ABNORMAL LOW (ref 36.0–46.0)
Hemoglobin: 9.4 g/dL — ABNORMAL LOW (ref 12.0–15.0)
Immature Granulocytes: 1 %
Lymphocytes Relative: 32 %
Lymphs Abs: 2.6 10*3/uL (ref 0.7–4.0)
MCH: 26.1 pg (ref 26.0–34.0)
MCHC: 29.8 g/dL — ABNORMAL LOW (ref 30.0–36.0)
MCV: 87.5 fL (ref 80.0–100.0)
Monocytes Absolute: 1 10*3/uL (ref 0.1–1.0)
Monocytes Relative: 12 %
Neutro Abs: 4.2 10*3/uL (ref 1.7–7.7)
Neutrophils Relative %: 52 %
Platelets: 350 10*3/uL (ref 150–400)
RBC: 3.6 MIL/uL — ABNORMAL LOW (ref 3.87–5.11)
RDW: 21.9 % — ABNORMAL HIGH (ref 11.5–15.5)
WBC: 8.1 10*3/uL (ref 4.0–10.5)
nRBC: 1.1 % — ABNORMAL HIGH (ref 0.0–0.2)

## 2019-12-25 MED ORDER — ZOLEDRONIC ACID 4 MG/100ML IV SOLN
4.0000 mg | Freq: Once | INTRAVENOUS | Status: AC
  Administered 2019-12-25: 4 mg via INTRAVENOUS

## 2019-12-25 MED ORDER — ONDANSETRON HCL 4 MG PO TABS: 4.0000 mg | ORAL_TABLET | Freq: Two times a day (BID) | ORAL | 1 refills | Status: AC

## 2019-12-25 MED ORDER — SODIUM CHLORIDE 0.9 % IV SOLN: Freq: Once | INTRAVENOUS | Status: AC

## 2019-12-25 MED ORDER — ZOLEDRONIC ACID 4 MG/100ML IV SOLN
INTRAVENOUS | Status: AC
  Filled 2019-12-25: qty 100

## 2019-12-25 NOTE — Progress Notes (Signed)
Heart rate 112. Dr. Delton Coombes aware. Vital signs and labs reviewed by MD. Message received by DWilson Rn/ Dr. Delton Coombes to infuse 1 Liter of Normal Saline over 2 hours and give Zometa today. Calcium 10.5 today.

## 2019-12-25 NOTE — Progress Notes (Signed)
1045 Labs reviewed with and pt seen by Dr. Delton Coombes and pt to receive NS 1 liter IV over 2 hours and Zometa 4 mg IV today per MD

## 2019-12-25 NOTE — Progress Notes (Signed)
1 Liter of Normal Saline given today over 2 hours and Zometa 4 mg IV given today per MD orders. Tolerated infusion without adverse affects. Vital signs stable. No complaints at this time. Discharged from clinic ambulatory in stable condition. Alert and oriented x 3. F/U with [facility] ([place] as scheduled.

## 2019-12-25 NOTE — Progress Notes (Signed)
[facility] [address], [place] [address]   CLINIC:  Medical Oncology/Hematology  PCP:  [name], MD [place] / [facility] [address] [phone]   REASON FOR VISIT:  Follow-up for metastatic left breast cancer to liver  PRIOR THERAPY:  1. Completed 1 year of Herceptin. 2. Tamoxifen for 5 years and Femara for 5 years.  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--  11/28/2019 Initial Diagnosis   Metastatic breast cancer (Krista Doyle)   12/05/2019 -  Chemotherapy   The patient had ondansetron (ZOFRAN) injection 8 mg, 8 mg (original dose ), Intravenous,  Once, 0 of 12 cycles Dose modification: 8 mg (Cycle 1) pegfilgrastim-jmdb (FULPHILA) injection 6 mg, 6 mg, Subcutaneous,  Once, 0 of 8 cycles DOCEtaxel (TAXOTERE) 110 mg in sodium chloride 0.9 % 250 mL chemo infusion, 75 mg/m2, Intravenous,  Once, 0 of 8 cycles pertuzumab (PERJETA) 840 mg in sodium chloride 0.9 % 250 mL chemo infusion, 840 mg, Intravenous, Once, 0 of 12 cycles trastuzumab-dkst (OGIVRI) 399 mg in sodium chloride 0.9 % 250 mL chemo infusion, 8 mg/kg, Intravenous,  Once, 0 of 12 cycles  for chemotherapy treatment.      CANCER STAGING: Cancer Staging No matching staging information was found for the patient.  INTERVAL HISTORY:  [name], a 59 y.o. female, returns for routine follow-up and consideration for first cycle of chemotherapy. [name] was last seen on 11/28/2019.  Due for initiating cycle #1 of docetaxel, pertuzumab and trastuzumab today.   Today she is accompanied by her son. Overall, she tells me she has been feeling okay. She initially went to rehab after discharge and now is living at home. She is currently on fentanyl for her back and neck pain and takes Percocet as  needed for breakthrough pain. She takes Percocet twice to three times daily as needed for the pain. Her appetite is decreased since she chokes occasionally with eating due to the cervical collar. She drinks 3-4 cans of Glucerna per day depending on how many meals she can tolerate. She reports having occasional tingling in her fingertips and constant numbness in her toes.  She will start treatment on the week of December 6th.  REVIEW OF SYSTEMS:  Review of Systems  Constitutional: Positive for appetite change (75%) and fatigue (50%).  HENT:   Positive for trouble swallowing (choking).   Musculoskeletal: Positive for back pain (8/10 back pain) and neck pain.  All other systems reviewed and are negative.   PAST MEDICAL/SURGICAL HISTORY:  Past Medical History:  Diagnosis Date  . Asthma   . Back pain   . Breast cancer ([place]) 2008   left  . Diabetes ([place])   . Hypercholesterolemia   . Hypertension   . 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 Personal history of chemotherapy   . Personal history of radiation therapy 2008  . Port-A-Cath in place 12/01/2019   Past Surgical History:  Procedure Laterality Date  . BACK SURGERY    . BREAST LUMPECTOMY    . COLONOSCOPY     about 3 yrs ago in [place]  . KNEE SURGERY     right  . TONSILLECTOMY      SOCIAL HISTORY:  Social History   Socioeconomic History  . Marital status: Married    Spouse name: Not  on file  . Number of children: Not on file  . Years of education: Not on file  . Highest education level: Not on file  Occupational History  . Occupation: Disabled  Tobacco Use  . Smoking status: Never Smoker  . Smokeless tobacco: Never Used  Substance and Sexual Activity  . Alcohol use: No    Alcohol/week: 0.0 standard drinks  . Drug use: No  . Sexual activity: Yes  Other Topics Concern  . Not on file  Social History Narrative  . Not on file   Social Determinants of Health   Financial Resource Strain: Low Risk   .  Difficulty of Paying Living Expenses: Not hard at all  Food Insecurity: No Food Insecurity  . Worried About Charity fundraiser in the Last Year: Never true  . Ran Out of Food in the Last Year: Never true  Transportation Needs: No Transportation Needs  . Lack of Transportation (Medical): No  . Lack of Transportation (Non-Medical): No  Physical Activity: Inactive  . Days of Exercise per Week: 0 days  . Minutes of Exercise per Session: 0 min  Stress: No Stress Concern Present  . Feeling of Stress : Not at all  Social Connections: Moderately Integrated  . Frequency of Communication with Friends and Family: More than three times a week  . Frequency of Social Gatherings with Friends and Family: Three times a week  . Attends Religious Services: 1 to 4 times per year  . Active Member of Clubs or Organizations: No  . Attends Archivist Meetings: Never  . Marital Status: Married  Human resources officer Violence: Not At Risk  . Fear of Current or Ex-Partner: No  . Emotionally Abused: No  . Physically Abused: No  . Sexually Abused: No    FAMILY HISTORY:  Family History  Problem Relation Age of Onset  . Colon cancer Neg Hx     CURRENT MEDICATIONS:  Current Outpatient Medications  Medication Sig Dispense Refill  . albuterol (PROVENTIL HFA;VENTOLIN HFA) 108 (90 Base) MCG/ACT inhaler Inhale 1 puff into the lungs every 6 (six) hours as needed for wheezing or shortness of breath.    Marland Kitchen amitriptyline (ELAVIL) 10 MG tablet Take 1 tablet (10 mg total) by mouth at bedtime.    Marland Kitchen atorvastatin (LIPITOR) 20 MG tablet Take 20 mg by mouth at bedtime.     . BD PEN NEEDLE NANO 2ND GEN 32G X 4 MM MISC SMARTSIG:Injection Daily    . Blood Glucose Monitoring Suppl (ONETOUCH VERIO REFLECT) w/Device KIT USE TO TEST BLOOD SUGAR 2 TIMES A DAY    . budesonide-formoterol (SYMBICORT) 160-4.5 MCG/ACT inhaler Inhale 2 puffs into the lungs every 12 (twelve) hours as needed (shortness of breath).     . fentaNYL  (DURAGESIC) 50 MCG/HR Place 1 patch onto the skin every 3 (three) days. 5 patch 0  . Lactulose 20 GM/30ML SOLN Please take 30 ml every 3 hours until you produce a bowel movement. Then take 30 ml at bedtime daily for constipation. (Patient taking differently: Take 20 g by mouth at bedtime. ) 946 mL 2  . Lancets (ONETOUCH DELICA PLUS [id number]) MISC USE TO TEST BLOOD SUGAR 2 TIMES A DAY    . letrozole (FEMARA) 2.5 MG tablet Take 1 tablet (2.5 mg total) by mouth daily. 90 tablet 3  . lidocaine-prilocaine (EMLA) cream Apply a small amount to port a cath site and cover with plastic wrap 1 hour prior to chemotherapy appointments 30 g 3  .  loratadine (CLARITIN) 10 MG tablet Take 10 mg by mouth daily as needed for allergies.    . magnesium oxide (MAG-OX) 400 (241.3 Mg) MG tablet Take 1 tablet (400 mg total) by mouth 2 (two) times daily.    . meclizine (ANTIVERT) 25 MG tablet Take 25 mg by mouth every 12 (twelve) hours as needed for dizziness.     . megestrol (MEGACE) 20 MG tablet Take 20 mg by mouth 2 (two) times daily.    . metoprolol tartrate (LOPRESSOR) 50 MG tablet Take 1 tablet (50 mg total) by mouth 2 (two) times daily.    . Multiple Vitamins-Minerals (PRESERVISION AREDS 2) CAPS Take 1 capsule by mouth daily.    . naloxegol oxalate (MOVANTIK) 25 MG TABS tablet Take 1 tablet (25 mg total) by mouth daily. 30 tablet 3  . ondansetron (ZOFRAN) 4 MG tablet Take 1 tablet (4 mg total) by mouth 2 (two) times daily. 20 tablet 1  . ONETOUCH VERIO test strip 1 each by Other route 2 (two) times daily.     Marland Kitchen oxyCODONE (OXY IR/ROXICODONE) 5 MG immediate release tablet Take 1 tablet (5 mg total) by mouth every 4 (four) hours as needed for moderate pain. 30 tablet 0  . polyethylene glycol (MIRALAX / GLYCOLAX) 17 g packet Take 17 g by mouth daily. 14 each 0  . prochlorperazine (COMPAZINE) 10 MG tablet Take 1 tablet (10 mg total) by mouth every 6 (six) hours as needed (Nausea or vomiting). 30 tablet 1  . WIXELA INHUB  500-50 MCG/DOSE AEPB Inhale 1 puff into the lungs 2 (two) times daily.     No current facility-administered medications for this visit.    ALLERGIES:  Allergies  Allergen Reactions  . Other     Cat/dog dander  . Pollen Extract     PHYSICAL EXAM:  Performance status (ECOG): 1 - Symptomatic but completely ambulatory  Vitals:   12/25/19 0910  BP: 136/85  Pulse: (!) 112  Resp: 16  Temp: (!) 97.3 F (36.3 C)  SpO2: 99%   Wt Readings from Last 3 Encounters:  12/25/19 107 lb 8 oz (48.8 kg)  12/08/19 114 lb 3.2 oz (51.8 kg)  12/01/19 114 lb 3.2 oz (51.8 kg)   Physical Exam Vitals reviewed.  Constitutional:      Appearance: Normal appearance.     Interventions: Cervical collar in place.  Cardiovascular:     Rate and Rhythm: Regular rhythm. Tachycardia present.     Pulses: Normal pulses.     Heart sounds: Normal heart sounds.  Pulmonary:     Effort: Pulmonary effort is normal.     Breath sounds: Normal breath sounds.  Neurological:     General: No focal deficit present.     Mental Status: She is alert and oriented to person, place, and time.  Psychiatric:        Mood and Affect: Mood normal.        Behavior: Behavior normal.     LABORATORY DATA:  I have reviewed the labs as listed.  CBC Latest Ref Rng & Units 12/25/2019 12/11/2019 12/09/2019  WBC 4.0 - 10.5 K/uL 8.1 10.4 8.3  Hemoglobin 12.0 - 15.0 g/dL 9.4(L) 8.8(L) 7.8(L)  Hematocrit 36 - 46 % 31.5(L) 28.7(L) 26.5(L)  Platelets 150 - 400 K/uL 350 306 280   CMP Latest Ref Rng & Units 12/25/2019 12/12/2019 12/11/2019  Glucose 70 - 99 mg/dL 131(H) 89 134(H)  BUN 8 - 23 mg/dL 8 5(L) 5(L)  Creatinine 0.44 -  1.00 mg/dL 0.92 0.68 0.59  Sodium 135 - 145 mmol/L 137 136 136  Potassium 3.5 - 5.1 mmol/L 4.1 3.2(L) 2.6(LL)  Chloride 98 - 111 mmol/L 99 101 100  CO2 22 - 32 mmol/L $RemoveB'26 23 23  'wCJMWYmO$ Calcium 8.9 - 10.3 mg/dL 10.5(H) 8.8(L) 8.9  Total Protein 6.5 - 8.1 g/dL 9.1(H) - 7.7  Total Bilirubin 0.3 - 1.2 mg/dL 0.5 - 1.0   Alkaline Phos 38 - 126 U/L 269(H) - 217(H)  AST 15 - 41 U/L 81(H) - 165(H)  ALT 0 - 44 U/L 25 - 70(H)    DIAGNOSTIC IMAGING:  I have independently reviewed the scans and discussed with the patient. CT Head Wo Contrast  Result Date: 12/08/2019 CLINICAL DATA:  Mental status changes metastatic breast cancer EXAM: CT HEAD WITHOUT CONTRAST TECHNIQUE: Contiguous axial images were obtained from the base of the skull through the vertex without intravenous contrast. COMPARISON:  11/29/2019 FINDINGS: Brain: Stable age related atrophy pattern without acute intracranial hemorrhage, acute infarction, new mass lesion, midline shift, herniation, hydrocephalus, or extra-axial fluid collection. No focal mass effect or edema. Cisterns are patent. No cerebellar abnormality. Vascular: No hyperdense vessel or unexpected calcification. Skull: Similar mottled irregular lytic skull lesions most pronounced in the bifrontal areas, right parietal area, right skull base and clivus. Associated right frontal dural thickening/involvement without change. Sinuses/Orbits: No acute finding. Other: None. IMPRESSION: No acute intracranial abnormality by noncontrast CT. Stable calvarial osseous metastases. Electronically Signed   By: Jerilynn Mages.  Shick M.D.   On: 12/08/2019 11:57   CT Head Wo Contrast  Result Date: 11/29/2019 CLINICAL DATA:  [place] EXAM: CT HEAD WITHOUT CONTRAST TECHNIQUE: Contiguous axial images were obtained from the base of the skull through the vertex without intravenous contrast. COMPARISON:  None. FINDINGS: Brain: No acute infarct or intracranial hemorrhage. No mass lesion. No midline shift, ventriculomegaly or extra-axial fluid collection. Vascular: No hyperdense vessel or unexpected calcification. Carotid siphon atherosclerotic calcifications. Skull: Mottled, heterogenous appearance of the bifrontal, right parietal calvarium and clivus. There is a 1.6 cm right calvarial soft tissue focus with dehiscence of the bone  (2:18). Sinuses/Orbits: Normal orbits. Clear paranasal sinuses. No mastoid effusion. Other: Asymmetric prominence of the right temporalis muscle (2:12). IMPRESSION: No acute infarct or intracranial hemorrhage. Right frontal calvarial soft tissue, asymmetric prominence of the right temporalis muscle and calvarial/clivus heterogeneity is concerning for metastases. MRI head with and without contrast is recommended for further evaluation. Electronically Signed   By: Primitivo Gauze M.D.   On: 11/29/2019 16:37   CT SOFT TISSUE NECK W CONTRAST  Result Date: 12/01/2019 CLINICAL DATA:  Epiglottitis or tonsillitis suspected.  Neck pain. EXAM: CT NECK WITH CONTRAST TECHNIQUE: Multidetector CT imaging of the neck was performed using the standard protocol following the bolus administration of intravenous contrast. CONTRAST:  9mL OMNIPAQUE IOHEXOL 350 MG/ML SOLN COMPARISON:  11/29/2019 CT and MRI head.  Concurrent CTA chest. FINDINGS: Pharynx and larynx: Clear nasopharynx. Normal appearance of the epiglottis. Partial effacement of the left vallecula and piriform sinus, likely secondary to neck positioning. The vallecula and piriform sinuses are otherwise unremarkable. No vocal cord paralysis. Small retropharyngeal effusion spanning the C2-C5 levels. Salivary glands: No inflammation, mass, or stone. Thyroid: Normal. Lymph nodes: Diffusely prominent subcentimeter cervical nodes. Vascular: Normal intravascular enhancement. Limited intracranial: Please see recent CT and MRI head. Visualized orbits: Normal orbits. Mastoids and visualized paranasal sinuses: Pneumatized. Skeleton: Diffuse heterogeneity of the bone marrow predominantly involving the cervical spine at the C1-2 and C6-T1 levels. There is multilevel  involvement of the posterior elements. Vertebral body heights are preserved. Perivertebral inflammatory changes most prominent at the C2 level. Please see recent CT and MRI head for better evaluation of dominant  clival and multifocal calvarial osseous metastases. Redemonstration of anterior left TMJ metastasis. Upper chest: Partially imaged pulmonary metastases. Please see concurrent CTA chest for additional findings below the thoracic inlet. Other: None. IMPRESSION: No evidence of epiglottitis or tonsillitis. Diffuse osseous metastases with prominent involvement of the cervical spine. Vertebral body heights are preserved however cannot exclude pending pathologic fracture. Perivertebral inflammatory changes most prominent at the atlantoaxial junction with small retropharyngeal effusion. Cannot exclude metastatic soft tissue involvement. MRI cervical spine is recommended for better evaluation. Partially imaged pulmonary metastases. Please see concurrent CTA chest for additional findings below the thoracic inlet. These results will be called to the ordering clinician or representative by the Radiologist Assistant, and communication documented in the PACS or [employer]. Electronically Signed   By: Primitivo Gauze M.D.   On: 12/01/2019 19:30   CT ANGIO CHEST PE W OR WO CONTRAST  Result Date: 12/01/2019 CLINICAL DATA:  PE suspected, low/intermediate prob, positive D-dimer History of metastatic breast cancer. EXAM: CT ANGIOGRAPHY CHEST WITH CONTRAST TECHNIQUE: Multidetector CT imaging of the chest was performed using the standard protocol during bolus administration of intravenous contrast. Multiplanar CT image reconstructions and MIPs were obtained to evaluate the vascular anatomy. CONTRAST:  3mL OMNIPAQUE IOHEXOL 350 MG/ML SOLN COMPARISON:  Chest CT 11/12/2019, thoracic spine MRI 11/18/2019 FINDINGS: Cardiovascular: There are no filling defects within the pulmonary arteries to suggest pulmonary embolus. Left hilar lymph node is enlarging in causing increasing mass effect on lingular pulmonary artery but no evidence of invasion. Common origin of brachiocephalic and left common carotid artery, variant arch  anatomy. Extensive mural thickening with severe luminal narrowing involving the proximal left subclavian artery is unchanged from recent exam. There is no aortic dissection or acute aortic findings. The heart is normal in size. No pericardial effusion. Mediastinum/Nodes: Left hilar lymph node has slightly increased in size currently measuring 18 x 15 mm, previously 16 x 14 mm. Soft tissue mass in the epicardial fat along the cardiac apex measures 3.3 x 2.1 cm (series 5, image 68), previously 2.7 x 2.0 cm. No esophageal wall thickening. Previous left thyroid nodule is not well-defined on the current exam due to streak artifact in the right subclavian vein from dense IV contrast. Surgical clips in the left axilla. Previous small right axillary nodes are partially obscured by dense IV contrast. Lungs/Pleura: Extensive bilateral pulmonary nodules are again seen, both random and pleural/subpleural. Majority of these are stable from recent prior, however largest nodule currently measures 2.2 x 1.2 cm, series 7, image 51, previously 1.9 x 1.1 cm. A small right pleural effusion is new, with adjacent compressive atelectasis. No septal thickening or findings of pulmonary edema. Upper Abdomen: Known liver lesions on prior exam are less well-defined given phase of IV contrast. Musculoskeletal: Diffuse osseous metastatic disease throughout the thoracic osseous structures lateral left breast soft tissue nodularity measures 15 x 12 mm, series 5, image 49. With lytic and blastic osseous lesions. Callus formation about right anterior ribs fractures, also seen on previous. Review of the MIP images confirms the above findings. IMPRESSION: 1. No pulmonary embolus. 2. New small right pleural effusion with adjacent compressive atelectasis. 3. Extensive bilateral pulmonary nodules consistent with metastatic disease. Majority of these are stable from recent prior, however largest pulmonary nodule has increased in size from prior. 4. Left  hilar lymph node has slightly increased in size from recent prior. Soft tissue mass in the epicardial fat along the left cardiac apex has increased in size. 5. Diffuse osseous metastatic disease. 6. Left breast soft tissue nodularity laterally again seen. 7. Known liver lesions on prior exam are less well-defined on the current exam given phase of IV contrast. 8. Extensive mural thickening with severe luminal narrowing involving the proximal left subclavian artery is unchanged from recent prior. Electronically Signed   By: Keith Rake M.D.   On: 12/01/2019 19:20   MR Brain W and Wo Contrast  Result Date: 11/29/2019 CLINICAL DATA:  Dizziness, nonspecific.  Brain mass or lesion. EXAM: MRI HEAD WITHOUT AND WITH CONTRAST TECHNIQUE: Multiplanar, multiecho pulse sequences of the brain and surrounding structures were obtained without and with intravenous contrast. CONTRAST:  7mL GADAVIST GADOBUTROL 1 MMOL/ML IV SOLN COMPARISON:  11/29/2019 head CT. FINDINGS: Brain: No acute infarct. No acute intracranial hemorrhage. No midline shift, ventriculomegaly or extra-axial fluid collection. Diffuse dural thickening overlying the right cerebral convexity. Focal dural thickening overlying the left parietal lobe (18:117). 1.5 x 0.5 cm right dural-based lesion overlying the temporal convexity (18:67). There is also dural thickening along the clivus. Vascular: Preserved major intracranial flow voids. Skull and upper cervical spine: Multifocal T2 hyperintense enhancing calvarial lesions reflect osseous metastases. Enhancing 1.8 cm right frontal calvarial soft tissue (15:35) with dehiscence of the overlying cortex. 2.6 x 1.8 cm clival metastasis (15:10). Smaller enhancing osseous lesions are also seen involving the bilateral sphenoid wings and occipital condyles. Sinuses/Orbits: Normal orbits clear paranasal sinuses. Trace right mastoid effusion. Other: 1.4 x 0.8 cm enhancing soft tissue involving the left TMJ (18:40).  Enhancing soft tissue infiltration involving the right temporalis muscle which is asymmetrically thickened (18:94). IMPRESSION: Dural-based metastatic disease demonstrating right predominance. Focal plaque-like dural lesions measuring up to 1.5 x 0.5 cm overlie the right temporal and left parietal convexities. Multifocal osseous metastases with dominant lesions involving the clivus, right frontal calvarium and left TMJ. Metastatic involvement of the right temporalis muscle. Electronically Signed   By: Primitivo Gauze M.D.   On: 11/29/2019 19:25   MR CERVICAL SPINE W WO CONTRAST  Result Date: 12/02/2019 CLINICAL DATA:  Metastatic breast cancer with possible pathologic fracture in the cervical spine. EXAM: MRI CERVICAL SPINE WITHOUT AND WITH CONTRAST TECHNIQUE: Multiplanar and multiecho pulse sequences of the cervical spine, to include the craniocervical junction and cervicothoracic junction, were obtained without and with intravenous contrast. CONTRAST:  104mL GADAVIST GADOBUTROL 1 MMOL/ML IV SOLN COMPARISON:  Neck CT 12/01/2019. Brain MRI 11/29/2019. Thoracic spine MRI 11/18/2019. FINDINGS: Alignment: Minimal retrolisthesis of C5 on C6 and C6 on C7. Vertebrae: Diffusely abnormal bone marrow signal throughout the cervical spine, included upper thoracic spine, and skull base consistent with known widespread osseous metastatic disease. Thin curvilinear region of low signal through the base of the dens suggestive of a nondisplaced fracture with possible extension more laterally in the C2 vertebral body. Extra osseous tumor extension about the C2 anterior and posterior elements. Cord: Normal signal. Posterior Fossa, vertebral arteries, paraspinal tissues: Preserved vertebral artery flow voids. Small prevertebral effusion. Previously described clival metastasis with overlying dural thickening extending into the upper cervical spine. No masslike cervical epidural tumor. Disc levels: Cervical disc degeneration  greatest at C5-6 where a broad-based posterior disc osteophyte complex and infolding of the ligamentum flavum result in mild-to-moderate spinal stenosis and mild-to-moderate right and severe left neural foraminal stenosis. Disc bulging and mild spurring at C6-7 resulting in  moderate right and mild left neural foraminal stenosis without significant spinal stenosis. IMPRESSION: 1. Widespread osseous metastatic disease. 2. Suspected nondisplaced pathologic fracture involving the C2 vertebral body and base of dens. 3. 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 No masslike cervical dural thickening/epidural tumor. 4. Cervical disc degeneration, worst at C5-6 where there is mild-to-moderate spinal stenosis and severe left neural foraminal stenosis. These results will be called to the ordering clinician or representative by the Radiologist Assistant, and communication documented in the PACS or [employer]. Electronically Signed   By: Logan Bores M.D.   On: 12/02/2019 17:01   DG Chest Portable 1 View  Result Date: 12/08/2019 CLINICAL DATA:  Unresponsive EXAM: PORTABLE CHEST 1 VIEW COMPARISON:  Portable exam 0949 hours compared to 11/29/2019 FINDINGS: Normal heart size, mediastinal contours, and pulmonary vascularity. Central peribronchial thickening. Questionable nodularity at lateral LEFT upper and mid thorax unchanged from CT. Interstitial prominence in LEFT greater than RIGHT lungs. No segmental consolidation, pleural effusion, or pneumothorax. Gaseous distention of stomach. No acute infiltrate, pleural effusion, or pneumothorax. Osseous demineralization. IMPRESSION: Diffuse interstitial prominence greater on LEFT with persistent nodularity at the lateral aspect of the LEFT apex corresponding to nodularity on CT. No new abnormalities identified. Electronically Signed   By: Lavonia Dana M.D.   On: 12/08/2019 10:31   DG Chest Port 1  View  Result Date: 11/29/2019 CLINICAL DATA:  Possible sepsis, metastatic breast cancer EXAM: PORTABLE CHEST 1 VIEW COMPARISON:  05/07/2019 FINDINGS: Increased interstitial prominence with patchy density. No pleural effusion or pneumothorax. Cardiomediastinal contours are within normal limits with normal heart size. IMPRESSION: Interstitial prominence and patchy density, at least a component of which likely reflects metastatic disease seen on recent chest CT. There may be superimposed edema or atypical infection. Electronically Signed   By: Macy Mis M.D.   On: 11/29/2019 14:45   ECHOCARDIOGRAM COMPLETE  Result Date: 12/02/2019    ECHOCARDIOGRAM REPORT   Patient Name:   ALIANA KREISCHER Date of Exam: 12/02/2019 Medical Rec #:  [id number]        Height:       63.0 in Accession #:    [id number]       Weight:       114.2 lb Date of Birth:  1960/10/14       BSA:          1.524 m Patient Age:    17 years         BP:           131/68 mmHg Patient Gender: F                HR:           99 bpm. Exam Location:  [facility] Procedure: 2D Echo, Cardiac Doppler and Color Doppler Indications:    R94.31 Abnormal EKG; R00.0 Tachycardia  History:        Patient has no prior history of Echocardiogram examinations.                 Abnormal ECG. Breast cancer. Chemotherapy.  Sonographer:    Roseanna Rainbow RDCS (AE) Referring Phys: [phone] [facility]  Sonographer Comments: Technically difficult study due to poor echo windows. Rib artifact. Attempted to turn. Images off-axis. IMPRESSIONS  1. Left ventricular ejection fraction, by estimation, is 60 to 65%. The left ventricle has normal function. The left ventricle has no regional wall motion abnormalities. Left ventricular diastolic parameters  are consistent with Grade I diastolic dysfunction (impaired relaxation).  2. Right ventricular systolic function is normal. The right ventricular size is normal.  3. The mitral valve is normal in structure. No evidence of mitral valve  regurgitation. No evidence of mitral stenosis.  4. The aortic valve is tricuspid. Aortic valve regurgitation is not visualized. No aortic stenosis is present.  5. The inferior vena cava is normal in size with greater than 50% respiratory variability, suggesting right atrial pressure of 3 mmHg. FINDINGS  Left Ventricle: Left ventricular ejection fraction, by estimation, is 60 to 65%. The left ventricle has normal function. The left ventricle has no regional wall motion abnormalities. The left ventricular internal cavity size was normal in size. There is  no left ventricular hypertrophy. Left ventricular diastolic parameters are consistent with Grade I diastolic dysfunction (impaired relaxation). Normal left ventricular filling pressure. Right Ventricle: The right ventricular size is normal. No increase in right ventricular wall thickness. Right ventricular systolic function is normal. Left Atrium: Left atrial size was normal in size. Right Atrium: Right atrial size was normal in size. Pericardium: There is no evidence of pericardial effusion. Mitral Valve: The mitral valve is normal in structure. No evidence of mitral valve regurgitation. No evidence of mitral valve stenosis. Tricuspid Valve: The tricuspid valve is normal in structure. Tricuspid valve regurgitation is not demonstrated. No evidence of tricuspid stenosis. Aortic Valve: The aortic valve is tricuspid. Aortic valve regurgitation is not visualized. No aortic stenosis is present. Aortic valve mean gradient measures 3.1 mmHg. Aortic valve peak gradient measures 6.4 mmHg. Aortic valve area, by VTI measures 1.83 cm. Pulmonic Valve: The pulmonic valve was not well visualized. Pulmonic valve regurgitation is not visualized. No evidence of pulmonic stenosis. Aorta: The aortic root is normal in size and structure. Pulmonary Artery: Indeterminant PASP, inadequate TR jet. Venous: The inferior vena cava is normal in size with greater than 50% respiratory  variability, suggesting right atrial pressure of 3 mmHg. IAS/Shunts: No atrial level shunt detected by color flow Doppler.  LEFT VENTRICLE PLAX 2D LVIDd:         3.36 cm     Diastology LVIDs:         2.16 cm     LV e' medial:   6.96 cm/s LV PW:         1.03 cm     LV E/e' medial: 12.4 LV IVS:        0.94 cm LVOT diam:     1.70 cm LV SV:         38 LV SV Index:   25 LVOT Area:     2.27 cm  LV Volumes (MOD) LV vol d, MOD A2C: 55.0 ml LV vol d, MOD A4C: 32.5 ml LV vol s, MOD A2C: 18.4 ml LV vol s, MOD A4C: 11.3 ml LV SV MOD A2C:     36.6 ml LV SV MOD A4C:     32.5 ml LV SV MOD BP:      32.0 ml RIGHT VENTRICLE RV S prime:     11.30 cm/s TAPSE (M-mode): 1.5 cm LEFT ATRIUM           Index      RIGHT ATRIUM          Index LA diam:      2.20 cm 1.44 cm/m RA Area:     7.17 cm LA Vol (A4C): 14.0 ml 9.19 ml/m RA Volume:   12.20 ml 8.01 ml/m  AORTIC VALVE AV  Area (Vmax):    1.81 cm AV Area (Vmean):   1.59 cm AV Area (VTI):     1.83 cm AV Vmax:           126.43 cm/s AV Vmean:          81.029 cm/s AV VTI:            0.208 m AV Peak Grad:      6.4 mmHg AV Mean Grad:      3.1 mmHg LVOT Vmax:         101.00 cm/s LVOT Vmean:        56.600 cm/s LVOT VTI:          0.168 m LVOT/AV VTI ratio: 0.81  AORTA Ao Root diam: 2.80 cm Ao Asc diam:  3.20 cm MITRAL VALVE MV Area (PHT): 4.21 cm    SHUNTS MV Decel Time: 180 msec    Systemic VTI:  0.17 m MV E velocity: 86.50 cm/s  Systemic Diam: 1.70 cm MV A velocity: 90.80 cm/s MV E/A ratio:  0.95 Carlyle Dolly MD Electronically signed by Carlyle Dolly MD Signature Date/Time: 12/02/2019/3:28:22 PM    Final      ASSESSMENT:  1.  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 -CTAP with contrast on 11/02/2019 done in the ER showed extensive hypodense lesions throughout the liver parenchyma concerning for metastatic disease. Lung nodules at  the left lung base. Extensive bone metastasis. Anterior wedge compression deformity of T12 vertebral body. -Liver biopsy on 11/19/2019 shows invasive ductal carcinoma, ER/PR positive, HER-2 positive by FISH, Ki-67-20%. -MRI of the thoracic spine on 11/18/2019 shows diffuse osseous metastatic disease with no significant extradural extension.  MRI of the lumbar spine on 10/24/2019 shows multifocal osseous signal abnormality with moderate disc degeneration at L3-L4 and L4-L5.  No epidural tumor. -CT chest with contrast showed widespread pleuroparenchymal, left hilar, osseous metastatic disease.  2. History of left breast cancer: -Diagnosed in 2008, ER/PR/HER-2 positive. Treated in [place], [place]. -Completed 1 year of Herceptin. -Took 5 years of tamoxifen and 5 years of Femara. -Genetic testing was reportedly negative.  3. Osteopenia: -DEXA scan on 03/27/2019 with T score -1.0. -Started on Prolia from 03/13/2015, last injection on 09/26/2019.   PLAN:  1.  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 -She is currently living at home.  She is accompanied by her son today. -We had a prolonged discussion about normal prognosis of metastatic HER-2 positive breast cancer. -We have discussed various options including best supportive care in the form of hospice versus active treatments with chemo and HER-2 directed therapy. -Upon extensive discussion, patient and her son would like to proceed with active treatment for her metastatic breast cancer in the palliative setting. -We will likely start her on docetaxel, trastuzumab and Pertuzumab next week.  We talked about the side effects of the chemotherapy regimen in detail. -We will start a dose reduction of docetaxel and see how she tolerates it. 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 -We  will also make a referral for swallow evaluation as she is complaining of difficulty swallowing.  2. Weight loss: -She lost another 7 pounds in the last 1 month. -Recommend nutritional supplements in the form  of boost plus up to 5 cans/day.  3. Back pain: -Continue fentanyl 50 mcg patch. -Continue oxycodone 5 mg every 4 hours as needed.  4.  Malignant hypercalcemia: -Her labs today showed calcium 10.5.  Albumin is 3.0.  Creatinine 0.92. -Recommend Zometa 4 mg and IV fluids today.  5.  Pathological fracture of C2 body: -MRI of the cervical spine on 12/02/2019 shows suspected nondisplaced pathological fracture involving C2 vertebral body and base of dens.  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  No masslike cervical dural thickening/epidural tumor. -She is wearing cervical collar at this time.  We will make a referral to Dr. Venetia Constable.   Orders placed this encounter:  Orders Placed This Encounter  Procedures  . SLP evaluation     Derek Jack, MD [place] [phone]   I, Milinda Antis, am acting as a scribe for Dr. Sanda Linger.  I, Derek Jack MD, have reviewed the above documentation for accuracy and completeness, and I agree with the above.

## 2019-12-25 NOTE — Patient Instructions (Signed)
[place] at [facility] Discharge Instructions  You were seen today by Dr. Delton Coombes. He went over your recent results. You received Zometa and fluids today to lower your blood calcium levels. Your treatment will involve chemotherapy medication called docetaxel and immunotherapy consisting of pertuzumab and trastuzumab; the first treatment will be on the week of December 6th. You will need a port to be placed to receive your chemo, so you will be referred to a general surgeon for it. You will be referred to Dr. Zada Finders, the neurosurgeon in [place], for follow-up of your cervical collar. You will also be referred to speech pathologist for a swallow study. Eat protein-dense meals and/or drink 6-8 cans of Glucerna daily to maintain your weight and strength. Drink at least 2 liters per day. Dr. Delton Coombes will see you back in 2 weeks for labs and follow up.   Thank you for choosing [place] at [facility] to provide your oncology and hematology care.  To afford each patient quality time with our provider, please arrive at least 15 minutes before your scheduled appointment time.   If you have a lab appointment with the [place] please come in thru the Main Entrance and check in at the main information desk  You need to re-schedule your appointment should you arrive 10 or more minutes late.  We strive to give you quality time with our providers, and arriving late affects you and other patients whose appointments are after yours.  Also, if you no show three or more times for appointments you may be dismissed from the clinic at the providers discretion.     Again, thank you for choosing [facility].  Our hope is that these requests will decrease the amount of time that you wait before being seen by our physicians.       _____________________________________________________________  Should you have questions after your visit to [facility], please contact our office at [phone] between the hours of 8:00 a.m. and 4:30 p.m.  Voicemails left after 4:00 p.m. will not be returned until the following business day.  For prescription refill requests, have your pharmacy contact our office and allow 72 hours.    Cancer Center Support Programs:   > Cancer Support Group  2nd Tuesday of the month 1pm-2pm, Journey Room

## 2019-12-25 NOTE — Progress Notes (Signed)
Patient was assessed by Dr. Delton Coombes and labs have been reviewed.  Heart rate 112 and calcium elevated at 10.5, orders received for 1 liter normal saline over 2 hours and zometa infusion.  Dr. Delton Coombes okay with proceeding with tachycardia.  Primary RN and pharmacy aware.

## 2019-12-25 NOTE — Patient Instructions (Addendum)
[place] at [facility] Discharge Instructions  Received Zometa and IV hydration today. Follow-up as scheduled   Thank you for choosing [place] at [facility] to provide your oncology and hematology care.  To afford each patient quality time with our provider, please arrive at least 15 minutes before your scheduled appointment time.   If you have a lab appointment with the [place] please come in thru the Main Entrance and check in at the main information desk.  You need to re-schedule your appointment should you arrive 10 or more minutes late.  We strive to give you quality time with our providers, and arriving late affects you and other patients whose appointments are after yours.  Also, if you no show three or more times for appointments you may be dismissed from the clinic at the providers discretion.     Again, thank you for choosing [facility].  Our hope is that these requests will decrease the amount of time that you wait before being seen by our physicians.       _____________________________________________________________  Should you have questions after your visit to [facility], please contact our office at [phone] and follow the prompts.  Our office hours are 8:00 a.m. and 4:30 p.m. Monday - Friday.  Please note that voicemails left after 4:00 p.m. may not be returned until the following business day.  We are closed weekends and major holidays.  You do have access to a nurse 24-7, just call the main number to the clinic [phone] and do not press any options, hold on the line and a nurse will answer the phone.    For prescription refill requests, have your pharmacy contact our office and allow 72 hours.    Due to Covid, you will need to wear a mask upon entering the hospital. If you do not have a mask, a mask will be given to you at the Main Entrance upon arrival. For doctor visits, patients  may have 1 support person age 3 or older with them. For treatment visits, patients can not have anyone with them due to social distancing guidelines and our immunocompromised population.    [place] at [facility]  Discharge Instructions:   _______________________________________________________________  Thank you for choosing [place] at [facility] to provide your oncology and hematology care.  To afford each patient quality time with our providers, please arrive at least 15 minutes before your scheduled appointment.  You need to re-schedule your appointment if you arrive 10 or more minutes late.  We strive to give you quality time with our providers, and arriving late affects you and other patients whose appointments are after yours.  Also, if you no show three or more times for appointments you may be dismissed from the clinic.  Again, thank you for choosing [place] at [place] hope is that these requests will allow you access to exceptional care and in a timely manner. _______________________________________________________________  If you have questions after your visit, please contact our office at [phone] between the hours of 8:30 a.m. and 5:00 p.m. Voicemails left after 4:30 p.m. will not be returned until the following business day. _______________________________________________________________  For prescription refill requests, have your pharmacy contact our office. _______________________________________________________________  Recommendations made by the consultant and any test results will be sent to your referring physician. _______________________________________________________________

## 2019-12-30 DIAGNOSIS — I10 Essential (primary) hypertension: Secondary | ICD-10-CM | POA: Diagnosis not present

## 2019-12-30 DIAGNOSIS — E1165 Type 2 diabetes mellitus with hyperglycemia: Secondary | ICD-10-CM | POA: Diagnosis not present

## 2019-12-30 DIAGNOSIS — E46 Unspecified protein-calorie malnutrition: Secondary | ICD-10-CM | POA: Diagnosis not present

## 2019-12-30 DIAGNOSIS — Z853 Personal history of malignant neoplasm of breast: Secondary | ICD-10-CM | POA: Diagnosis not present

## 2019-12-30 DIAGNOSIS — T451X5S Adverse effect of antineoplastic and immunosuppressive drugs, sequela: Secondary | ICD-10-CM | POA: Diagnosis not present

## 2020-01-01 ENCOUNTER — Other Ambulatory Visit (HOSPITAL_COMMUNITY): Payer: Self-pay | Admitting: Specialist

## 2020-01-01 DIAGNOSIS — R1312 Dysphagia, oropharyngeal phase: Secondary | ICD-10-CM

## 2020-01-01 NOTE — Patient Instructions (Addendum)
[facility] Chemotherapy Teaching   You are diagnosed with metastatic (Stage IV) breast cancer.  We will treat you in the clinic every 3 weeks with a combination of chemotherapy and immunotherapy drugs.  Those drugs are:  trastuzumab (Herceptin); pertuzumab (Perjeta); and docetaxel (Taxotere).  The intent of treatment is to control this cancer, keep it from growing and spreading further, and to alleviate any symptoms you may be having related to this disease.  You will see the doctor regularly throughout treatment.  We will obtain blood work from you prior to every treatment and monitor your results to make sure it is safe to give your treatment. The doctor monitors your response to treatment by the way you are feeling, your blood work, and by obtaining scans periodically.  There will be wait times while you are here for treatment.  It will take about 30 minutes to 1 hour for your lab work to result.  Then there will be wait times while pharmacy mixes your medications.   Medications you will receive in the clinic prior to your chemotherapy medications:  Zofran:  Anti-nausea medication used to prevent nausea and vomiting that chemotherapy can cause.  Dexamethasone:  This is a steroid given prior to chemotherapy to help prevent allergic reactions; it may also help prevent and control nausea and diarrhea.   Benadryl:  This is a histamine blocker (different from the Pepcid) that helps prevent allergic/infusion reactions to your chemotherapy. This medication may cause dizziness/drowsiness.  Tylenol:  Given prior to immunotherapy infusions to prevent infusion reactions such as fever/chills.  Fulphila - this is a bone marrow stimulant you will receive after chemotherapy to help your body produce neutrophils (a type of white blood cell).  These white blood cells help your body fight infections. You will receive this medication via injection under the skin in your belly at least 24 hours after  receiving chemotherapy. The most common side effect of this medication is body aches and bone pain.  Taking OTC Claritin 10 mg daily several days prior to and after receiving this medication may help with this pain.    Docetaxel (Taxotere)  About This Drug  Docetaxel is used to treat cancer. It is given in the vein (IV) through your port a cath.  It will take 1 hour to infuse. Your first infusion will take longer than 1 hour due to the fact that we start it at a very slow rate and gradually increase the rate until the maximum infusion rate is reached.  This is done in order to monitor you closely for allergic/infusion reactions.  Your nurse will remain in the room with you for the first 15 minutes of this infusion on your first time getting it.  If you tolerate the first infusion without adverse reactions, going forward we will give you this drug at the normal infusion rate over 1 hour.   Possible Side Effects . Bone marrow suppression. This is a decrease in the number of white blood cells, red blood cells, and platelets. This may raise your risk of infection, make you tired and weak (fatigue), and raise your risk of bleeding.  . Fever in the setting of decreased white blood cells, which is a serious condition that can be lifethreatening  . Soreness of the mouth and throat. You may have red areas, white patches, or sores that hurt.  . Nausea and vomiting (throwing up)  . Constipation (not able to move bowels)  . Diarrhea (loose bowel movements)  .  Infections  . Swelling of your legs, ankles and/or feet, or fluid build-up around your lungs, heart or elsewhere   . Changes in the way food and drinks taste  . Effects on the nerves are called peripheral neuropathy. You may feel numbness, tingling, or pain in your hands and feet. It may be hard for you to button your clothes, open jars, or walk as usual. The effect on the nerves may get worse with more doses of the drug. These effects get  better in some people after the drug is stopped but it does not get better in all people.  . Decreased appetite (decreased hunger)  . Weakness  . Pain  . Muscle pain/aching  . Trouble breathing  . Changes in your nail color, you may have nail loss and/or brittle nail   . Hair loss. Hair loss is often temporary, although there have been cases of permanent hair loss reported. Hair loss may happen suddenly or gradually. If you lose hair, you may lose it from your head, face, armpits, pubic area, chest, and/or legs. You may also notice your hair getting thin.  . Allergic skin reaction. You may develop blisters on your skin that are filled with fluid or a severe red rash all over your body that may be painful.  . Allergic reactions, including anaphylaxis are rare but may happen in some patients. Signs of allergic reaction to this drug may be swelling of the face, feeling like your tongue or throat are swelling, trouble breathing, rash, itching, fever, chills, feeling dizzy, and/or feeling that your heart is beating in a fast or not normal way. If this happens, do not take another dose of this drug. You should get urgent medical treatment.  Note: Not all possible side effects are included above.  Warnings and Precautions . Severe bone marrow suppression, including febrile neutropenia - fever in the setting of decreased white blood cells, which may be life threatening.  . Severe allergic reactions, including anaphylaxis which can be life-threatening  . Swelling (inflammation) in the colon in the setting of severely low white blood cells, which raises your risk of infection and can be life-threatening  . Severe skin reactions, including redness, swelling or peeling of skin  . Severe swelling in the eye or other changes in eyesight  . Severe swelling of your legs, ankles and/or feet. Sometimes, fluid can build up in your lungs and/or around your heart causing you trouble breathing.  . If  you have a history of abnormal liver function, receive high doses of docetaxel, or have a history of lung cancer and have received treatment with a platinum (type of chemotherapy medication), you have an increased risk of death.  . Severe weakness  . This drug may raise your risk of getting a second cancer such as leukemia and myelodysplastic syndrome.  . Severe peripheral neuropathy - numbness, tingling, or pain in your hands and feet . This drug contains alcohol and may affect your central nervous system. The central nervous system is made up of your brain and spinal cord. You may feel drunk during and after your treatment and it can impair your ability to drive or use machinery for one to two hours after infusion.   . Tumor lysis syndrome: This drug may act on the cancer cells very quickly. This may affect how your kidneys work.  Note: Some of the side effects above are very rare. If you have concerns and/or questions, please discuss them with your medical team.  Important Information . This drug may be present in the saliva, tears, sweat, urine, stool, vomit, semen, and vaginal secretions. Talk to your doctor and/or your nurse about the necessary precautions to take during this time.  Treating Side Effects . Manage tiredness by pacing your activities for the day.  . Be sure to include periods of rest between energy-draining activities.  . Get regular exercise. If you feel too tired to exercise vigorously, try taking a short walk.  . To decrease the risk of infection, wash your hands regularly.  . Avoid close contact with people who have a cold, the flu, or other infections.  . Take your temperature as your doctor or nurse tells you, and whenever you feel like you may have a fever.  . To help decrease the risk of bleeding, use a soft toothbrush. Check with your nurse before using dental floss.  . Be very careful when using knives or tools.  . Use an electric shaver instead of a  razor.  . Mouth care is very important and will help food taste better and improve your appetite. Your mouth care should consist of routine, gentle cleaning of your teeth or dentures and rinsing your mouth with a mixture of 1/2 teaspoon of salt in 8 ounces of water or 1/2 teaspoon of baking soda in 8 ounces of water. This should be done at least after each meal and at bedtime.  . If you have mouth sores, avoid mouthwash that has alcohol. Also avoid alcohol and smoking because they can bother your mouth and throat.  . Ask your doctor or nurse about medicines that are available to help stop or lessen constipation and/or diarrhea.  . If you are not able to move your bowels, check with your doctor or nurse before you use enemas, laxatives, or suppositories.  . Drink plenty of fluids (a minimum of eight glasses per day - 64 oz -  is recommended).  . If you throw up or have loose bowel movements, you should drink more fluids so that you do not become dehydrated (lack of water in the body from losing too much fluid).  . If you have diarrhea, eat low-fiber foods that are high in protein and calories and avoid foods that can irritate your digestive tracts or lead to cramping.  . To help with nausea and vomiting, eat small, frequent meals instead of three large meals a day. Choose foods and drinks that are at room temperature. Ask your nurse or doctor about other helpful tips and medicine that is available to help stop or lessen these symptoms.  . To help with decreased appetite, eat foods high in calories and protein, such as meat, poultry, fish, dry beans, tofu, eggs, nuts, milk, yogurt, cheese, ice cream, pudding, and nutritional supplements.  . Consider using sauces and spices to increase taste. Daily exercise, with your doctor's approval, may increase your appetite.  Marland Kitchen Keeping your pain under control is important to your well-being. Please tell your doctor or nurse if you are experiencing pain.  .  If you get a rash do not put anything on it unless your doctor or nurse says you may. Keep the area around the rash clean and dry. Ask your doctor for medicine if your rash bothers you.  Marland Kitchen Keeping your nails moisturized may help with brittleness.  . To help with hair loss, wash with a mild shampoo and avoid washing your hair every day.  . Avoid rubbing your scalp, pat your hair or  scalp dry.  . Avoid coloring your hair.  . Limit your use of hair spray, electric curlers, blow dryers, and curling irons.  . If you are interested in getting a wig, talk to your nurse. You can also call the [facility] at 800-ACS-2345 to find out information about the "Look Good, Feel Better" program close to where you live. It is a free program where women getting chemotherapy can learn about wigs, turbans and scarves as well as makeup techniques and skin and nail care.  . If you have numbness and tingling in your hands and feet, be careful when cooking, walking, and handling sharp objects and hot liquids.  Food and Drug Interactions . There are no known interactions of docetaxel with food.  . This drug may interact with other medicines. Tell your doctor and pharmacist about all the prescription and over-the-counter medicines and dietary supplements (vitamins, minerals, herbs and others) that you are taking at this time. Also, check with your doctor or pharmacist before starting any new prescription or over-the-counter medicines, or dietary supplements to make sure that there are no interactions.  When to Call the Doctor Call your doctor or nurse if you have any of these symptoms and/or any new or unusual symptoms:  . Fever of 100.4 F (38 C) or higher  . Chills  . Blurred vision or other changes in eyesight  . Easy bruising or bleeding  . Wheezing or trouble breathing  . Chest pain  . Feeling dizzy or lightheaded  . Tiredness that interferes with your daily activities  . Pain in your  mouth or throat that makes it hard to eat or drink  . Nausea that stops you from eating or drinking and/or is not relieved by prescribed medicines  . Throwing up  . Lasting loss of appetite or rapid weight loss of five pounds in a week  . Diarrhea, 4 times in one day or diarrhea with lack of strength or a feeling of being dizzy  . No bowel movement in 3 days or when you feel uncomfortable  . Severe abdominal pain that does not go away  . Blood in your stool  . Numbness, tingling, or pain in your hands and feet  . Swelling of legs, ankles, or feet  . Weight gain of 5 pounds in one week (fluid retention)  . Extreme weakness that interferes with normal activities  . New rash and/or itching  . Rash that is not relieved by prescribed medicines  . Signs of inflammation/infection (redness, swelling, pain) of the tissue around your nails.  . Signs of allergic reaction: swelling of the face, feeling like your tongue or throat are swelling, trouble breathing, rash, itching, fever, chills, feeling dizzy, and/or feeling that your heart is beating in a fast or not normal way. If this happens, call 911 for emergency care.  . Flu-like symptoms: fever, headache, muscle and joint aches, and fatigue (low energy, feeling weak)  . Signs of possible liver problems: dark urine, pale bowel movements, bad stomach pain, feeling very tired and weak, unusual itching, or yellowing of the eyes or skin  . Symptoms of being drunk, confusion, or being very sleepy  . Confusion or agitation, decreased urine, nausea/vomiting, diarrhea, muscle cramping, numbness and/or tingling, seizures  . General pain that does not go away or is not relieved by prescribed medicine  . If you think you may be pregnant or have impregnated your partner  Reproduction Warnings . Pregnancy warning: This drug can have  harmful effects on the unborn baby. Women of childbearing potential should use effective methods of birth control  during your cancer treatment and for 6 months after treatment. Men with female partners of childbearing potential should use effective methods of birth control during your cancer treatment and for 3 months after your cancer treatment. Let your doctor know right away if you think you may be pregnant or may have impregnated your partner.  . Breastfeeding warning: Women should not breastfeed during treatment and for 1 week after treatment because this drug could enter the breast milk and cause harm to a breastfeeding baby.   . Fertility warning: In men, this drug may affect your ability to have children in the future. Talk with your doctor or nurse if you plan to have children. Ask for information on sperm banking.  Pertuzumab (Perjeta)  About This Drug Pertuzumab is used to treat cancer. It is given in the vein (IV) through your port a cath.  The first infusion will be given over 1 hour.  Subsequent infusions will be given over 30 minutes.   Possible Side Effects . Bone marrow suppression. This is a decrease in the number of white blood cells, red blood cells, and platelets. This may raise your risk of infection, make you tired and weak (fatigue), and raise your risk of bleeding.  . Nausea and vomiting (throwing up)  . Diarrhea (loose bowel movements)  . Not able to move bowels (constipation)  . Tiredness  . Headache  . Muscle pain/aching  . Effects on the nerves are called peripheral neuropathy. You may feel numbness, tingling, or pain in your hands and feet. It may be hard for you to button your clothes, open jars, or walk as usual. The effect on the nerves may get worse with more doses of the drug. These effects get better in some people after the drug is stopped but it does not get better in all people.  . Rash  . Hair loss. Hair loss is often temporary, although with certain medicine, hair loss can sometimes be permanent. Hair loss may happen suddenly or gradually. If you lose hair,  you may lose it from your head, face, armpits, pubic area, chest, and/or legs. You may also notice your hair getting thin.  Note: Each of the side effects above was reported in 30% or greater of patients treated with pertuzumab. Not all possible side effects are included above.  Warnings and Precautions . Congestive heart failure - your heart has less ability to pump blood properly. You may be short of breath. Your arms, hands, legs and feet may swell.   . Allergic reactions, including anaphylaxis are rare but may happen in some patients. Signs of allergic reaction to this drug may be swelling of the face, feeling like your tongue or throat are swelling, trouble breathing, rash, itching, fever, chills, feeling dizzy, and/or feeling that your heart is beating in a fast or not normal way. If this happens, do not take another dose of this drug. You should get urgent medical treatment.  . While you are getting this drug in your vein (IV), you may have a reaction to the drug. Sometimes you may be given medication to stop or lessen these side effects. Your nurse will check you closely for these signs: fever or shaking chills, flushing, facial swelling, feeling dizzy, headache, trouble breathing, rash, itching, chest tightness, or chest pain. These reactions may happen after your infusion. If this happens, call 911 for emergency care.  Note: Some of the side effects above are very rare. If you have concerns and/or questions, please discuss them with your medical team.  Important Information . This drug may be present in the saliva, tears, sweat, urine, stool, vomit, semen, and vaginal secretions. Talk to your doctor and/or your nurse about the necessary precautions to take during this time.  Treating Side Effects . Manage tiredness by pacing your activities for the day.  . Be sure to include periods of rest between energy-draining activities.  . To decrease the risk of infection, wash your hands  regularly.  . Avoid close contact with people who have a cold, the flu, or other infections.  . Take your temperature as your doctor or nurse tells you, and whenever you feel like you may have a fever.  . To help decrease the risk of bleeding, use a soft toothbrush. Check with your nurse before using dental floss.  . Be very careful when using knives or tools.  . Use an electric shaver instead of a razor.  . Drink plenty of fluids (a minimum of eight glasses per day is recommended).  . To help with nausea and vomiting, eat small, frequent meals instead of three large meals a day. Choose foods and drinks that are at room temperature. Ask your nurse or doctor about other helpful tips and medicine that is available to help stop or lessen these symptoms.  . If you throw up or have loose bowel movements, you should drink more fluids so that you do not become dehydrated (lack of water in the body from losing too much fluid).  . If you have diarrhea, eat low-fiber foods that are high in protein and calories and avoid foods that can irritate your digestive tracts or lead to cramping.  . Ask your nurse or doctor about medicine that can lessen or stop your diarrhea or constipation.  . If you are not able to move your bowels, check with your doctor or nurse before you use enemas, laxatives, or suppositories.  . If you get a rash do not put anything on it unless your doctor or nurse says you may. Keep the area around the rash clean and dry. Ask your doctor for medicine if your rash bothers you.  . If you have numbness and tingling in your hands and feet, be careful when cooking, walking, and handling sharp objects and hot liquids.  . Infusion reactions may occur after your infusion. If this happens, call 911 for emergency care.  . To help with hair loss, wash with a mild shampoo and avoid washing your hair every day.  . Avoid rubbing your scalp, pat your hair or scalp dry.  . Avoid coloring  your hair.  . Limit your use of hair spray, electric curlers, blow dryers, and curling irons.  . If you are interested in getting a wig, talk to your nurse. You can also call the [facility] at 800-ACS-2345 to find out information about the "Look Good, Feel Better" program close to where you live. It is a free program where women getting chemotherapy can learn about wigs, turbans and scarves as well as makeup techniques and skin and nail care.  Marland Kitchen Keeping your pain under control is important to your well-being. Please tell your doctor or nurse if you are experiencing pain.  . Get regular exercise. If you feel too tired to exercise vigorously, try taking a short walk.  Food and Drug Interactions . There are no known interactions  of pertuzumab with food.  . This drug may interact with other medicines. Tell your doctor and pharmacist about all the prescription and over-the-counter medicines and dietary supplements (vitamins, minerals, herbs and others) that you are taking at this time. Also, check with your doctor or pharmacist before starting any new prescription or over-the-counter medicines, or dietary supplements to make sure that there are no interactions.  When to Call the Doctor Call your doctor or nurse if you have any of these symptoms and/or any new or unusual symptoms:  . Fever of 100.4 F (38 C) or higher  . Chills  . Trouble breathing  . Tiredness or weakness that interferes with your daily activities  . Feeling dizzy or lightheaded  . Easy bleeding or bruising  . Swelling of arms, legs, ankles, or feet  . Weight gain of 5 pounds in one week (fluid retention)  . Headache that does not go away  . Nausea that stops you from eating or drinking and/or is not relieved by prescribed medicines  . Throwing up  . Diarrhea, 4 times in one day or diarrhea with lack of strength or a feeling of being dizzy  . No bowel movement in 3 days or when you feel  uncomfortable  . A new rash or a rash that is not relieved by prescribed medicines  . Numbness, tingling, or pain in your hands and feet  . Signs of allergic reaction: swelling of the face, feeling like your tongue or throat are swelling, trouble breathing, rash, itching, fever, chills, feeling dizzy, and/or feeling that your heart is beating in a fast or not normal way. If this happens, call 911 for emergency care.  . Signs of infusion reaction: fever or shaking chills, flushing, facial swelling, feeling dizzy, headache, trouble breathing, rash, itching, chest tightness, or chest pain. If this happens, call 911 for emergency care.  . If you think you may be pregnant  Reproduction Warnings . Pregnancy warning: This drug can have harmful effects on the unborn baby. Women of childbearing potential should use effective methods of birth control during your cancer treatment and for 7 months after treatment. Let your doctor know right away if you think you may be pregnant.  . Breastfeeding warning: It is not known if this drug passes into breast milk. For this reason, women should not breastfeed during treatment and for 7 months after treatment because this drug could enter the  breast milk and cause harm to a breastfeeding baby.  . Fertility warning: Human fertility studies have not been done with this drug. Talk with your doctor or nurse if you plan to have children. Ask for information on sperm or egg banking.   Trastuzumab-xxxx (Herceptin, [place], [place], [place], [place], [place])  About This Drug  Trastuzumab-xxxx is used to treat cancer. It is given in the vein (IV) through your port a cath.  The first infusion will be given over 90 minutes.  The second infusion will be given over 60 minutes.  The third infusion and all those after will be given over 30 minutes.   Possible Side Effects . Bone marrow suppression. This is a decrease in the number of white blood cells, red blood  cells, and platelets. This may raise your risk of infection, make you tired and weak (fatigue), and raise your risk of bleeding.  . Congestive heart failure - your heart has less ability to pump blood properly  . Soreness of the mouth and throat. You may have red areas, white  patches, or sores that hurt.  . Nausea  . Diarrhea (loose bowel movements)  . Fever  . Chills  . Tiredness  . Infection  . Inflammation of nasal passages and throat  . Changes in the way food and drinks taste  . Weight loss  . Headache  . Trouble sleeping  . Cough  . Upper respiratory infection  . Rash  Note: Each of the side effects above was reported in 10% or greater of patients treated with trastuzumab-xxxx. Not all possible side effects are included above.  Warnings and Precautions . Changes in the tissue of the heart and heart function. Some changes may happen that can cause your heart to have less ability to pump blood. This drug may also increase your risk of heart attack.   . Serious and life-threatening lung problems such as inflammation (swelling) and scarring of the lungs which makes breathing difficult.  . While you are getting this drug in your vein (IV), you may have a reaction to the drug. Sometimes you may be given medication to stop or lessen these side effects. Your nurse will check you closely for these signs: fever or shaking chills, flushing, facial swelling, feeling dizzy, headache, trouble breathing, rash, itching, chest tightness, or chest pain. These reactions may happen after your infusion. If this happens, call 911 for emergency care.  . Severe decrease in the number of white blood cells, especially when receiving this drug in combination with other chemotherapy. This may raise your risk of infection which may be lifethreatening.  Note: Some of the side effects above are very rare. If you have concerns and/or questions, please discuss them with your medical  team.  Important Information . This drug may be present in the saliva, tears, sweat, urine, stool, vomit, semen, and vaginal secretions. Talk to your doctor and/or your nurse about the necessary precautions to take during this time.  Treating Side Effects . Manage tiredness by pacing your activities for the day.  . Be sure to include periods of rest between energy-draining activities.  . To decrease the risk of infection, wash your hands regularly.  . Avoid close contact with people who have a cold, the flu, or other infections.  . Take your temperature as your doctor or nurse tells you, and whenever you feel like you may have a fever.  . To help decrease the risk of bleeding, use a soft toothbrush. Check with your nurse before using dental floss.  . Be very careful when using knives or tools.  . Use an electric shaver instead of a razor.  . Drink plenty of fluids (a minimum of eight glasses per day is recommended).  . To help with nausea, eat small, frequent meals instead of three large meals a day. Choose foods and drinks that are at room temperature. Ask your nurse or doctor about other helpful tips and medicine that is available to help stop or lessen these symptoms.  . Mouth care is very important. Your mouth care should consist of routine, gentle cleaning of your teeth or dentures and rinsing your mouth with a mixture of 1/2 teaspoon of salt in 8 ounces of water or 1/2 teaspoon of baking soda in 8 ounces of water. This should be done at least after each meal and at bedtime.  . Taking good care of your mouth may help food taste better and improve your appetite.  . If you have mouth sores, avoid mouthwash that has alcohol. Also avoid alcohol and smoking because  they can bother your mouth and throat.  . If you throw up or have loose bowel movements, you should drink more fluids so that you do not become dehydrated (lack of water in the body from losing too much fluid).  . If you  have diarrhea, eat low-fiber foods that are high in protein and calories and avoid foods that can irritate your digestive tracts or lead to cramping.  . Ask your nurse or doctor about medicine that can lessen or stop your diarrhea.  . To help with weight loss, drink fluids that contribute calories (whole milk, juice, soft drinks, sweetened beverages, milkshakes, and nutritional supplements) instead of water.  . Include a source of protein at every meal and snack, such as meat, poultry, fish, dry beans, tofu, eggs, nuts, milk, yogurt, cheese, ice cream, pudding, and nutritional supplements.  . If you get a rash do not put anything on it unless your doctor or nurse says you may. Keep the area around the rash clean and dry. Ask your doctor for medicine if your rash bothers you.  Marland Kitchen Keeping your pain under control is important to your well-being. Please tell your doctor or nurse if you are experiencing pain.  . If you are having trouble sleeping, talk to your nurse or doctor on tips to help you sleep better.  . Infusion reactions may occur after your infusion. If this happens, call 911 for emergency care.  Food and Drug Interactions . There are no known interactions of [id number] with food.  . This drug may interact with other medicines. Tell your doctor and pharmacist about all the prescription and over-the-counter medicines and dietary supplements (vitamins, minerals, herbs and others) that you are taking at this time. Also, check with your doctor or pharmacist before starting any new prescription or over-the-counter medicines, or dietary supplements to make sure that there are no interactions.  When to Call the Doctor Call your doctor or nurse if you have any of these symptoms and/or any new or unusual symptoms:  . Fever of 100.4 F (38 C) or higher  . Chills  . Tiredness that interferes with your daily activities  . Trouble falling or staying asleep  . Feeling dizzy or  lightheaded  . A headache that does not go away  . Easy bleeding or bruising  . Wheezing or trouble breathing or dry cough  . Coughing up yellow, green, or bloody mucus  . Feeling that your heart is beating in a fast or not normal way (palpitations)  . Chest pain or symptoms of a heart attack. Most heart attacks involve pain in the center of the chest that lasts more than a few minutes. The pain may go away and come back, or it can be constant. It can feel like pressure, squeezing, fullness, or pain. Sometimes pain is felt in one or both arms, the back, neck, jaw, or stomach. If any of these symptoms last 2 minutes, call 911.  . Pain in your mouth or throat that makes it hard to eat or drink  . Nausea that stops you from eating or drinking and/or is not relieved by prescribed medicines  . Diarrhea, 4 times in one day or diarrhea with lack of strength or a feeling of being dizzy  . Lasting loss of appetite or rapid weight loss of five pounds in a week  . Swelling of arms, hand, legs, and/or feet  . Weight gain of 5 pounds in one week (fluid retention)  . A new  rash and/or itching that is not relieved by prescribed medicines  . Signs of infusion reaction: fever or shaking chills, flushing, facial swelling, feeling dizzy, headache, trouble breathing, rash, itching, chest tightness, or chest pain. If this happens call 911 for emergency care.  . If you think you may be pregnant  Reproduction Warnings . Pregnancy warning: This drug can have harmful effects on the unborn baby. Women of childbearing potential should use effective methods of birth control during your cancer treatment and for 7 months after treatment. Let your doctor know right away if you think you may be pregnant during treatment or within 7 months of receiving treatment.  . Breastfeeding warning: It is not known if this drug passes into breast milk. For this reason, women should talk to their doctor about the risks and  benefits of breastfeeding during treatment with this drug and for 7 months after treatment because this drug may enter the breast milk and cause harm to a breastfeeding baby.  . Fertility warning: Human fertility studies have not been done with this drug. Talk with your doctor or nurse if you plan to have children. Ask for information on sperm or egg banking.   SELF CARE ACTIVITIES WHILE RECEIVING CHEMOTHERAPY:  Hydration Increase your fluid intake 48 hours prior to treatment and drink at least 8 to 12 cups (64 ounces) of water/decaffeinated beverages per day after treatment. You can still have your cup of coffee or soda but these beverages do not count as part of your 8 to 12 cups that you need to drink daily. No alcohol intake.  Medications Continue taking your normal prescription medication as prescribed.  If you start any new herbal or new supplements please let us know first to make sure it is safe.  Mouth Care Have teeth cleaned professionally before starting treatment. Keep dentures and partial plates clean. Use soft toothbrush and do not use mouthwashes that contain alcohol. Biotene is a good mouthwash that is available at most pharmacies or may be ordered by calling [phone]. Use warm salt water gargles (1 teaspoon salt per 1 quart warm water) before and after meals and at bedtime. If you need dental work, please let the doctor know before you go for your appointment so that we can coordinate the best possible time for you in regards to your chemo regimen. You need to also let your dentist know that you are actively taking chemo. We may need to do labs prior to your dental appointment.  Skin Care Always use sunscreen that has not expired and with SPF (Sun Protection Factor) of 50 or higher. Wear hats to protect your head from the sun. Remember to use sunscreen on your hands, ears, face, & feet.  Use good moisturizing lotions such as udder cream, eucerin, or even Vaseline. Some  chemotherapies can cause dry skin, color changes in your skin and nails.    . Avoid long, hot showers or baths. . Use gentle, fragrance-free soaps and laundry detergent. . Use moisturizers, preferably creams or ointments rather than lotions because the thicker consistency is better at preventing skin dehydration. Apply the cream or ointment within 15 minutes of showering. Reapply moisturizer at night, and moisturize your hands every time after you wash them.  Hair Loss (if your doctor says your hair will fall out)  . If your doctor says that your hair is likely to fall out, decide before you begin chemo whether you want to wear a wig. You may want to shop before  treatment to match your hair color. . Hats, turbans, and scarves can also camouflage hair loss, although some people prefer to leave their heads uncovered. If you go bare-headed outdoors, be sure to use sunscreen on your scalp. . Cut your hair short. It eases the inconvenience of shedding lots of hair, but it also can reduce the emotional impact of watching your hair fall out. . Don't perm or color your hair during chemotherapy. Those chemical treatments are already damaging to hair and can enhance hair loss. Once your chemo treatments are done and your hair has grown back, it's OK to resume dyeing or perming hair.  With chemotherapy, hair loss is almost always temporary. But when it grows back, it may be a different color or texture. In older adults who still had hair color before chemotherapy, the new growth may be completely gray.  Often, new hair is very fine and soft.  Infection Prevention Please wash your hands for at least 30 seconds using warm soapy water. Handwashing is the #1 way to prevent the spread of germs. Stay away from sick people or people who are getting over a cold. If you develop respiratory systems such as green/yellow mucus production or productive cough or persistent cough let us know and we will see if you need an  antibiotic. It is a good idea to keep a pair of gloves on when going into grocery stores/[employer] to decrease your risk of coming into contact with germs on the carts, etc. Carry alcohol hand gel with you at all times and use it frequently if out in public. If your temperature reaches 100.5 or higher please call the clinic and let us know.  If it is after hours or on the weekend please go to the ER if your temperature is over 100.5.  Please have your own personal thermometer at home to use.    Sex and bodily fluids If you are going to have sex, a condom must be used to protect the person that isn't taking chemotherapy. Chemo can decrease your libido (sex drive). For a few days after chemotherapy, chemotherapy can be excreted through your bodily fluids.  When using the toilet please close the lid and flush the toilet twice.  Do this for a few day after you have had chemotherapy.   Effects of chemotherapy on your sex life Some changes are simple and won't last long. They won't affect your sex life permanently.  Sometimes you may feel: . too tired . not strong enough to be very active . sick or sore  . not in the mood . anxious or low Your anxiety might not seem related to sex. For example, you may be worried about the cancer and how your treatment is going. Or you may be worried about money, or about how you family are coping with your illness.  These things can cause stress, which can affect your interest in sex. It's important to talk to your partner about how you feel.  Remember - the changes to your sex life don't usually last long. There's usually no medical reason to stop having sex during chemo. The drugs won't have any long term physical effects on your performance or enjoyment of sex. Cancer can't be passed on to your partner during sex  Contraception It's important to use reliable contraception during treatment. Avoid getting pregnant while you or your partner are having chemotherapy. This is  because the drugs may harm the baby. Sometimes chemotherapy drugs can leave a man  or woman infertile.  This means you would not be able to have children in the future. You might want to talk to someone about permanent infertility. It can be very difficult to learn that you may no longer be able to have children. Some people find counselling helpful. There might be ways to preserve your fertility, although this is easier for men than for women. You may want to speak to a fertility expert. You can talk about sperm banking or harvesting your eggs. You can also ask about other fertility options, such as donor eggs. If you have or have had breast cancer, your doctor might advise you not to take the contraceptive pill. This is because the hormones in it might affect the cancer. It is not known for sure whether or not chemotherapy drugs can be passed on through semen or secretions from the vagina. Because of this some doctors advise people to use a barrier method if you have sex during treatment. This applies to vaginal, anal or oral sex. Generally, doctors advise a barrier method only for the time you are actually having the treatment and for about a week after your treatment. Advice like this can be worrying, but this does not mean that you have to avoid being intimate with your partner. You can still have close contact with your partner and continue to enjoy sex.  Animals If you have cats or birds we just ask that you not change the litter or change the cage.  Please have someone else do this for you while you are on chemotherapy.   Food Safety During and After Cancer Treatment Food safety is important for people both during and after cancer treatment. Cancer and cancer treatments, such as chemotherapy, radiation therapy, and stem cell/bone marrow transplantation, often weaken the immune system. This makes it harder for your body to protect itself from foodborne illness, also called food poisoning. Foodborne  illness is caused by eating food that contains harmful bacteria, parasites, or viruses.  Foods to avoid Some foods have a higher risk of becoming tainted with bacteria. These include: Marland Kitchen Unwashed fresh fruit and vegetables, especially leafy vegetables that can hide dirt and other contaminants . Raw sprouts, such as alfalfa sprouts . Raw or undercooked beef, especially ground beef, or other raw or undercooked meat and poultry . Fatty, fried, or spicy foods immediately before or after treatment.  These can sit heavy on your stomach and make you feel nauseous. . Raw or undercooked shellfish, such as oysters. . Sushi and sashimi, which often contain raw fish.  . Unpasteurized beverages, such as unpasteurized fruit juices, raw milk, raw yogurt, or cider . Undercooked eggs, such as soft boiled, over easy, and poached; raw, unpasteurized eggs; or foods made with raw egg, such as homemade raw cookie dough and homemade mayonnaise  Simple steps for food safety  Shop smart. . Do not buy food stored or displayed in an unclean area. . Do not buy bruised or damaged fruits or vegetables. . Do not buy cans that have cracks, dents, or bulges. . Pick up foods that can spoil at the end of your shopping trip and store them in a cooler on the way home.  Prepare and clean up foods carefully. . Rinse all fresh fruits and vegetables under running water, and dry them with a clean towel or paper towel. . Clean the top of cans before opening them. . After preparing food, wash your hands for 20 seconds with hot water and soap. Pay  special attention to areas between fingers and under nails. . Clean your utensils and dishes with hot water and soap. Marland Kitchen Disinfect your kitchen and cutting boards using 1 teaspoon of liquid, unscented bleach mixed into 1 quart of water.    Dispose of old food. . Eat canned and packaged food before its expiration date (the "use by" or "best before" date). . Consume refrigerated leftovers  within 3 to 4 days. After that time, throw out the food. Even if the food does not smell or look spoiled, it still may be unsafe. Some bacteria, such as Listeria, can grow even on foods stored in the refrigerator if they are kept for too long.  Take precautions when eating out. . At restaurants, avoid buffets and salad bars where food sits out for a long time and comes in contact with many people. Food can become contaminated when someone with a virus, often a norovirus, or another "bug" handles it. . Put any leftover food in a "to-go" container yourself, rather than having the server do it. And, refrigerate leftovers as soon as you get home. . Choose restaurants that are clean and that are willing to prepare your food as you order it cooked.   AT HOME MEDICATIONS:                                                                                                                                                                Compazine/Prochlorperazine 10mg  tablet. Take 1 tablet every 6 hours as needed for nausea/vomiting. (This can make you sleepy)   EMLA cream. Apply a quarter size amount to port site 1 hour prior to chemo. Do not rub in. Cover with plastic wrap.    Diarrhea Sheet   If you are having loose stools/diarrhea, please purchase Imodium and begin taking as outlined:  At the first sign of poorly formed or loose stools you should begin taking Imodium (loperamide) 2 mg capsules. Take two tablets (4mg ) followed by one tablet (2mg ) every 2 hours - DO NOT EXCEED 8 tablets in 24 hours.  If it is bedtime and you are having loose stools, take 2 tablets at bedtime, then 2 tablets every 4 hours until morning.   Always call the [facility] if you are having loose stools/diarrhea that you can't get under control.  Loose stools/diarrhea leads to dehydration (loss of water) in your body.  We have other options of trying to get the loose stools/diarrhea to stop but you must let us  know!   Constipation Sheet  Colace - 100 mg capsules - take 2 capsules daily.  If this doesn't help then you can increase to 2 capsules twice daily.  Please call if the above does not work for you. Do not go more than 2 days  without a bowel movement.  It is very important that you do not become constipated.  It will make you feel sick to your stomach (nausea) and can cause abdominal pain and vomiting.  Nausea Sheet   Compazine/Prochlorperazine 10mg  tablet. Take 1 tablet every 6 hours as needed for nausea/vomiting (This can make you drowsy).  If you are having persistent nausea (nausea that does not stop) please call the [facility] and let us know the amount of nausea that you are experiencing.  If you begin to vomit, you need to call the [facility] and if it is the weekend and you have vomited more than one time and can't get it to stop-go to the Emergency Room.  Persistent nausea/vomiting can lead to dehydration (loss of fluid in your body) and will make you feel very weak and unwell. Ice chips, sips of clear liquids, foods that are at room temperature, crackers, and toast tend to be better tolerated.   SYMPTOMS TO REPORT AS SOON AS POSSIBLE AFTER TREATMENT:  FEVER GREATER THAN 100.4 F  CHILLS WITH OR WITHOUT FEVER  NAUSEA AND VOMITING THAT IS NOT CONTROLLED WITH YOUR NAUSEA MEDICATION  UNUSUAL SHORTNESS OF BREATH  UNUSUAL BRUISING OR BLEEDING  TENDERNESS IN MOUTH AND THROAT WITH OR WITHOUT   PRESENCE OF ULCERS  URINARY PROBLEMS  BOWEL PROBLEMS  UNUSUAL RASH     Wear comfortable clothing and clothing appropriate for easy access to any Portacath or PICC line. Let us know if there is anything that we can do to make your therapy better!    What to do if you need assistance after hours or on the weekends: CALL [phone].  HOLD on the line, do not hang up.  You will hear multiple messages but at the end you will be connected with a nurse triage line.  They will contact  the doctor if necessary.  Most of the time they will be able to assist you.  Do not call the hospital operator.      I have been informed and understand all of the instructions given to me and have received a copy. I have been instructed to call the clinic [phone] or my family physician as soon as possible for continued medical care, if indicated. I do not have any more questions at this time but understand that I may call the [facility] or the Patient Navigator at [phone] during office hours should I have questions or need assistance in obtaining follow-up care.

## 2020-01-02 ENCOUNTER — Other Ambulatory Visit: Payer: Self-pay

## 2020-01-02 ENCOUNTER — Ambulatory Visit (INDEPENDENT_AMBULATORY_CARE_PROVIDER_SITE_OTHER): Payer: BC Managed Care – PPO | Admitting: General Surgery

## 2020-01-02 ENCOUNTER — Encounter (HOSPITAL_COMMUNITY): Payer: Self-pay

## 2020-01-02 ENCOUNTER — Encounter: Payer: Self-pay | Admitting: General Surgery

## 2020-01-02 ENCOUNTER — Inpatient Hospital Stay (HOSPITAL_COMMUNITY): Payer: BC Managed Care – PPO | Attending: Hematology

## 2020-01-02 ENCOUNTER — Other Ambulatory Visit (HOSPITAL_COMMUNITY): Payer: Self-pay | Admitting: *Deleted

## 2020-01-02 VITALS — BP 124/79 | HR 125 | Temp 98.0°F | Resp 16 | Ht 63.0 in | Wt 106.0 lb

## 2020-01-02 DIAGNOSIS — M8448XA Pathological fracture, other site, initial encounter for fracture: Secondary | ICD-10-CM | POA: Insufficient documentation

## 2020-01-02 DIAGNOSIS — C7951 Secondary malignant neoplasm of bone: Secondary | ICD-10-CM | POA: Insufficient documentation

## 2020-01-02 DIAGNOSIS — C50412 Malignant neoplasm of upper-outer quadrant of left female breast: Secondary | ICD-10-CM

## 2020-01-02 DIAGNOSIS — Z79899 Other long term (current) drug therapy: Secondary | ICD-10-CM | POA: Insufficient documentation

## 2020-01-02 DIAGNOSIS — M549 Dorsalgia, unspecified: Secondary | ICD-10-CM | POA: Insufficient documentation

## 2020-01-02 DIAGNOSIS — Z5112 Encounter for antineoplastic immunotherapy: Secondary | ICD-10-CM | POA: Insufficient documentation

## 2020-01-02 DIAGNOSIS — C7802 Secondary malignant neoplasm of left lung: Secondary | ICD-10-CM | POA: Insufficient documentation

## 2020-01-02 DIAGNOSIS — K5900 Constipation, unspecified: Secondary | ICD-10-CM | POA: Insufficient documentation

## 2020-01-02 DIAGNOSIS — R634 Abnormal weight loss: Secondary | ICD-10-CM | POA: Insufficient documentation

## 2020-01-02 DIAGNOSIS — Z5189 Encounter for other specified aftercare: Secondary | ICD-10-CM | POA: Insufficient documentation

## 2020-01-02 DIAGNOSIS — R5383 Other fatigue: Secondary | ICD-10-CM | POA: Insufficient documentation

## 2020-01-02 DIAGNOSIS — C7801 Secondary malignant neoplasm of right lung: Secondary | ICD-10-CM | POA: Insufficient documentation

## 2020-01-02 DIAGNOSIS — R2242 Localized swelling, mass and lump, left lower limb: Secondary | ICD-10-CM | POA: Insufficient documentation

## 2020-01-02 DIAGNOSIS — Z5111 Encounter for antineoplastic chemotherapy: Secondary | ICD-10-CM | POA: Insufficient documentation

## 2020-01-02 DIAGNOSIS — G893 Neoplasm related pain (acute) (chronic): Secondary | ICD-10-CM | POA: Insufficient documentation

## 2020-01-02 DIAGNOSIS — Z170 Estrogen receptor positive status [ER+]: Secondary | ICD-10-CM | POA: Insufficient documentation

## 2020-01-02 DIAGNOSIS — G479 Sleep disorder, unspecified: Secondary | ICD-10-CM | POA: Insufficient documentation

## 2020-01-02 DIAGNOSIS — E7800 Pure hypercholesterolemia, unspecified: Secondary | ICD-10-CM | POA: Insufficient documentation

## 2020-01-02 DIAGNOSIS — I10 Essential (primary) hypertension: Secondary | ICD-10-CM | POA: Insufficient documentation

## 2020-01-02 DIAGNOSIS — Z923 Personal history of irradiation: Secondary | ICD-10-CM | POA: Insufficient documentation

## 2020-01-02 DIAGNOSIS — R200 Anesthesia of skin: Secondary | ICD-10-CM | POA: Insufficient documentation

## 2020-01-02 DIAGNOSIS — E119 Type 2 diabetes mellitus without complications: Secondary | ICD-10-CM | POA: Insufficient documentation

## 2020-01-02 DIAGNOSIS — M8580 Other specified disorders of bone density and structure, unspecified site: Secondary | ICD-10-CM | POA: Insufficient documentation

## 2020-01-02 DIAGNOSIS — Z9221 Personal history of antineoplastic chemotherapy: Secondary | ICD-10-CM | POA: Insufficient documentation

## 2020-01-02 DIAGNOSIS — C787 Secondary malignant neoplasm of liver and intrahepatic bile duct: Secondary | ICD-10-CM | POA: Insufficient documentation

## 2020-01-02 NOTE — Patient Instructions (Signed)

## 2020-01-02 NOTE — Progress Notes (Signed)
Patient was referred to my office for Port-A-Cath insertion.  When she showed up, she was noted to be in a c-collar due to a C2 fracture.  I told her that due to this injury, I could not safely place a Port-A-Cath in the operating room due to her C2 fracture and inability of anesthesia to provide monitored anesthesia care.  We discussed this with oncology who will schedule her to undergo Port-A-Cath placement by interventional radiology.

## 2020-01-02 NOTE — Progress Notes (Signed)
Chemotherapy and immunotherapy education packet given and discussed with pt and family in detail. Discussed diagnosis and staging, tx regimen, and intent of tx. Reviewed chemotherapy and immunotherapy medications and side effects, as well as pre-medications. Instructed on how to manage side effects at home, and when to call the clinic. Importance of fever/chills discussed with pt and family. Discussed precautions to implement at home after receiving tx, as well as self care strategies. Phone numbers provided for clinic during regular working hours, also how to reach the clinic after hours and on weekends. Pt and family provided the opportunity to ask questions - all questions answered to pt's and family satisfaction.   

## 2020-01-02 NOTE — Progress Notes (Signed)
Orders placed for IR to place port, local surgeon could not do it.

## 2020-01-03 DIAGNOSIS — S129XXA Fracture of neck, unspecified, initial encounter: Secondary | ICD-10-CM | POA: Diagnosis not present

## 2020-01-06 ENCOUNTER — Encounter (HOSPITAL_COMMUNITY): Payer: Self-pay | Admitting: Speech Pathology

## 2020-01-06 ENCOUNTER — Ambulatory Visit (HOSPITAL_COMMUNITY): Payer: BC Managed Care – PPO | Attending: Hematology | Admitting: Speech Pathology

## 2020-01-06 ENCOUNTER — Other Ambulatory Visit: Payer: Self-pay

## 2020-01-06 ENCOUNTER — Ambulatory Visit (HOSPITAL_COMMUNITY)
Admission: RE | Admit: 2020-01-06 | Discharge: 2020-01-06 | Disposition: A | Discharge status: Home / Self Care | Payer: BC Managed Care – PPO | Source: Ambulatory Visit | Attending: Hematology | Admitting: Hematology

## 2020-01-06 ENCOUNTER — Other Ambulatory Visit: Payer: Self-pay | Admitting: Radiology

## 2020-01-06 DIAGNOSIS — R1312 Dysphagia, oropharyngeal phase: Secondary | ICD-10-CM | POA: Insufficient documentation

## 2020-01-06 DIAGNOSIS — C7981 Secondary malignant neoplasm of breast: Secondary | ICD-10-CM | POA: Diagnosis not present

## 2020-01-06 DIAGNOSIS — R1310 Dysphagia, unspecified: Secondary | ICD-10-CM | POA: Diagnosis not present

## 2020-01-06 DIAGNOSIS — C50412 Malignant neoplasm of upper-outer quadrant of left female breast: Secondary | ICD-10-CM | POA: Diagnosis not present

## 2020-01-06 DIAGNOSIS — Z170 Estrogen receptor positive status [ER+]: Secondary | ICD-10-CM | POA: Insufficient documentation

## 2020-01-06 NOTE — Therapy (Signed)
[place], [place], [address] Phone: [phone]   Fax:  [phone]  Modified Barium Swallow  Patient Details  Name: Krista Doyle MRN: [id number] Date of Birth: April 30, 1960 No data recorded  Encounter Date: 01/06/2020   End of Session - 01/06/20 1234    Visit Number 1    Number of Visits 1    Authorization Type [employer] PPO    SLP Start Time 2876    SLP Stop Time  1202    SLP Time Calculation (min) 27 min    Activity Tolerance Patient tolerated treatment well           Past Medical History:  Diagnosis Date  . Asthma   . Back pain   . Breast cancer ([facility]) 2008   left  . Diabetes ([facility])   . Hypercholesterolemia   . Hypertension   . 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 Personal history of chemotherapy   . Personal history of radiation therapy 2008  . Port-A-Cath in place 12/01/2019    Past Surgical History:  Procedure Laterality Date  . BACK SURGERY    . BREAST LUMPECTOMY    . COLONOSCOPY     about 3 yrs ago in [place]  . KNEE SURGERY     right  . TONSILLECTOMY      There were no vitals filed for this visit.   Subjective Assessment - 01/06/20 1222    Subjective "I have trouble swallowing sandwhiches, meats, and pasta."    Special Tests MBSS    Currently in Pain? No/denies             General - 01/06/20 1224      General Information   Date of Onset 12/02/19    HPI Krista Doyle  is a 59 y.o. female, with past medical history of hypertension, hyperlipidemia, chemotherapy-induced neuropathy, diabetes mellitus, history of breast cancer visually diagnosed in 2008, status post radiation therapy and chemotherapy,  with recent diagnosis of metastatic disease to liver and osseous metastasis, patient was recently diagnosed with lumbar/thoracic spine osseous metastasis, liver metastasis as well. Pt MRI showed: suspected nondisplaced pathologic fracture involving the C2 vertebral body  and base of dens. She reported dysphagia to meats and breads following that and is referred for MBSS by Dr. Derek Jack. Pt reports slight improvement, but still has some trouble at times.    Type of Study MBS-Modified Barium Swallow Study    Previous Swallow Assessment N/A    Diet Prior to this Study Regular;Thin liquids    Temperature Spikes Noted No    Respiratory Status Room air    History of Recent Intubation No    Behavior/Cognition Alert;Cooperative;Pleasant mood    Oral Cavity Assessment Within Functional Limits    Oral Care Completed by SLP No    Oral Cavity - Dentition Adequate natural dentition    Vision Functional for self feeding    Self-Feeding Abilities Able to feed self    Patient Positioning Upright in chair    Baseline Vocal Quality Normal    Volitional Cough Strong    Volitional Swallow Able to elicit    Anatomy Within functional limits    Pharyngeal Secretions Not observed secondary MBS              Oral Preparation/Oral Phase - 01/06/20 1225      Oral Preparation/Oral Phase   Oral Phase Within functional limits      Electrical  stimulation - Oral Phase   Was Electrical Stimulation Used No            Pharyngeal Phase - 01/06/20 1225      Pharyngeal Phase   Pharyngeal Phase Impaired      Pharyngeal - Thin   Pharyngeal- Thin Teaspoon Within functional limits;Swallow initiation at vallecula    Pharyngeal- Thin Cup Swallow initiation at vallecula;Pharyngeal residue - valleculae;Other (Comment)    Pharyngeal- Thin Straw Swallow initiation at vallecula;Pharyngeal residue - valleculae;Pharyngeal residue - posterior pharnyx      Pharyngeal - Solids   Pharyngeal- Puree Swallow initiation at vallecula;Pharyngeal residue - valleculae    Pharyngeal- Regular Pharyngeal residue - cp segment    Pharyngeal- Pill Other (Comment)   stasis at CP segment, but then clears with liquid wash     Pharyngeal Phase - Comment   Pharyngeal Comment Pt with slight  swelling of posterior pharyngeal wall ~C1-2 which creates a bulge during the swallow and results with in residuals along posterior pharyngeal wall and valleculae and clears with secondary swallow      Electrical Stimulation - Pharyngeal Phase   Was Electrical Stimulation Used No            Cricopharyngeal Phase - 01/06/20 1232      Cervical Esophageal Phase   Cervical Esophageal Phase Within functional limits                SLP Short Term Goals - 01/06/20 1239      SLP SHORT TERM GOAL #1   Title N/A              Plan - 01/06/20 1235    Clinical Impression Statement Pt presents with mild pharyngeal phase dysphagia characterized by swallow trigger at the level of the valleculae across textures and consistencies, min reduced tongue base approximation with posterior pharyngeal wall near C1-2 due to slight swelling along posterior pharyngeal wall which creates a bulge with swallow trapping tail of bolus along posterior pharyngeal wall and valleculae. This results in min vallecular and posterior pharyngeal wall residuals which clear with a dry swallow and/or liquid wash. No penetration or aspiration observed. The barium tablet was briefly delayed before entering the UES, however passed through with liquid wash. Suspect symptoms will continue to improve as swelling continues to dissipate along C1-2 posterior pharyngeal wall. Recommend regular textures and thin liquids, however Pt may wish to modify regular textures by chopping meats well and adding moisture, swallow 2x for each bite, and follow solids with a sip of liquid. Pt is taking her medications crushed in puree at this time and she can continue to do this if she is more comfortable, or try whole in puree or whole with water. No further SLP services indicated at this time.    Consulted and Agree with Plan of Care Patient           Patient will benefit from skilled therapeutic intervention in order to improve the following  deficits and impairments:   Dysphagia, oropharyngeal phase     Recommendations/Treatment - 01/06/20 1232      Swallow Evaluation Recommendations   SLP Diet Recommendations Age appropriate regular;Thin    Liquid Administration via Cup;Straw    Medication Administration Whole meds with liquid   can try whole in puree, liquid wash, Pt prefers crushed in puree now   Supervision Patient able to self feed    Compensations Multiple dry swallows after each bite/sip;Follow solids with liquid    Postural Changes  Seated upright at 90 degrees;Remain upright for at least 30 minutes after feeds/meals            Prognosis - 01/06/20 1233      Prognosis   Prognosis for Safe Diet Advancement Good   expect improvement as swelling continues to subside along C-1-2     Individuals Consulted   Consulted and Agree with Results and Recommendations Patient    Report Sent to  Referring physician           Problem List Patient Active Problem List   Diagnosis Date Noted  . Protein-calorie malnutrition, severe 12/10/2019  . Cancer associated pain 12/10/2019  . DNR (do not resuscitate) discussion   . Encounter for hospice care discussion   . Acute metabolic encephalopathy 45/40/9811  . Overdose opiate, accidental or unintentional, initial encounter ([facility]) 12/08/2019  . Weakness   . Cancer related pain   . Palliative care by specialist   . Port-A-Cath in place 12/01/2019  . Hypercalcemia 11/29/2019  . Goals of care, counseling/discussion 11/28/2019  . 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 Lumbar facet arthropathy 10/07/2015  . 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 Constipation due to opioid therapy 05/08/2015  . Genetic testing 03/17/2015  . Osteopenia determined by x-ray 03/13/2015  . Lumbar spondylosis 02/11/2015  . Lumbar radiculopathy 02/11/2015  . Chemotherapy-induced peripheral neuropathy ([facility]) 02/11/2015  . Vitamin D deficiency 02/02/2015  .  Stage IV Breast cancer of upper-outer quadrant of left female breast ([facility]) 12/22/2014  . High risk medication use 12/22/2014    Thank you,  Genene Churn, [place]  [facility] 01/06/2020, 12:58 PM  [place] [address] [place] [address], [place], [address] Phone: [phone]   Fax:  [phone]  Name: Krista Doyle MRN: [id number] Date of Birth: 21-Jan-1961

## 2020-01-07 ENCOUNTER — Telehealth: Payer: Self-pay | Admitting: Student

## 2020-01-07 ENCOUNTER — Encounter (HOSPITAL_COMMUNITY): Payer: Self-pay

## 2020-01-07 ENCOUNTER — Ambulatory Visit (HOSPITAL_COMMUNITY)
Admission: RE | Admit: 2020-01-07 | Discharge: 2020-01-07 | Disposition: A | Discharge status: Home / Self Care | Payer: BC Managed Care – PPO | Source: Ambulatory Visit | Attending: Hematology | Admitting: Hematology

## 2020-01-07 DIAGNOSIS — Z170 Estrogen receptor positive status [ER+]: Secondary | ICD-10-CM | POA: Diagnosis not present

## 2020-01-07 DIAGNOSIS — Z452 Encounter for adjustment and management of vascular access device: Secondary | ICD-10-CM | POA: Diagnosis not present

## 2020-01-07 DIAGNOSIS — C787 Secondary malignant neoplasm of liver and intrahepatic bile duct: Secondary | ICD-10-CM | POA: Diagnosis not present

## 2020-01-07 DIAGNOSIS — C7951 Secondary malignant neoplasm of bone: Secondary | ICD-10-CM | POA: Diagnosis not present

## 2020-01-07 DIAGNOSIS — C50919 Malignant neoplasm of unspecified site of unspecified female breast: Secondary | ICD-10-CM | POA: Diagnosis not present

## 2020-01-07 DIAGNOSIS — R1312 Dysphagia, oropharyngeal phase: Secondary | ICD-10-CM | POA: Diagnosis not present

## 2020-01-07 DIAGNOSIS — C50412 Malignant neoplasm of upper-outer quadrant of left female breast: Secondary | ICD-10-CM

## 2020-01-07 LAB — GLUCOSE, CAPILLARY
Glucose-Capillary: 62 mg/dL — ABNORMAL LOW (ref 70–99)
Glucose-Capillary: 127 mg/dL — ABNORMAL HIGH (ref 70–99)

## 2020-01-07 MED ORDER — DEXTROSE 50 % IV SOLN
INTRAVENOUS | Status: AC
  Administered 2020-01-07: 12.5 g via INTRAVENOUS
  Filled 2020-01-07: qty 50

## 2020-01-07 MED ORDER — DEXTROSE 50 % IV SOLN: 12.5000 g | INTRAVENOUS | Status: AC

## 2020-01-07 MED ORDER — SODIUM CHLORIDE 0.9 % IV SOLN: INTRAVENOUS | Status: DC

## 2020-01-07 NOTE — Progress Notes (Signed)
CBG: 60 pt asymptomatic, NPO, hypoglycemic protocol initiated, 12.5gm dextrose given.  Will continue to monitor

## 2020-01-07 NOTE — Telephone Encounter (Signed)
Patient will return to [facility] Thursday 01/09/20 for re-scheduled port-a-catheter placement. Patient advised to come straight to IR (not short stay) since the patient has already had labs drawn today. Patient verbalized understanding. Patient knows to call IR with any questions/concerns prior to her visit.  Soyla Dryer, [place] [phone] 01/07/2020, 5:23 PM

## 2020-01-07 NOTE — H&P (Signed)
Chief Complaint: Patient was seen in consultation today for [facility] a cath placement at the request of Katragadda,Sreedhar  Referring Physician(s): Conservation officer, historic buildings Physician: Dr Ruthann Cancer  Patient Status: [facility] - Out-pt  History of Present Illness: Krista Doyle is a 59 y.o. female   DM; HLD; HTN Hx Breast Ca 2008 (had PAC then til 2010 or 2012) New metastasis to liver and bones Wt loss; LUQ pain Recent hospitalization 11/7 for accidental opiate overdose  Liver biopsy on 11/19/2019 shows invasive ductal carcinoma, ER/PR positive, HER-2 positive by FISH, Ki-67-20%.  MRI of the thoracic spine on 11/18/2019 shows diffuse osseous metastatic disease with no significant extradural extension. MRI of the lumbar spine on 10/24/2019 shows multifocal osseous signal abnormality with moderate disc degeneration at L3-L4 and L4-L5. No epidural tumor.  -CT chest with contrast showed widespread pleuroparenchymal, left hilar, osseous metastatic disease.  Now scheduled for [facility] placement Starts chemo 01/08/20  Past Medical History:  Diagnosis Date  . Asthma   . Back pain   . Breast cancer ([place]) 2008   left  . Diabetes ([place])   . Hypercholesterolemia   . Hypertension   . 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 Personal history of chemotherapy   . Personal history of radiation therapy 2008  . Port-A-Cath in place 12/01/2019    Past Surgical History:  Procedure Laterality Date  . BACK SURGERY    . BREAST LUMPECTOMY    . COLONOSCOPY     about 3 yrs ago in [place]  . KNEE SURGERY     right  . TONSILLECTOMY      Allergies: Other and Pollen extract  Medications: Prior to Admission medications   Medication Sig Start Date End Date Taking? Authorizing Provider  albuterol (PROVENTIL HFA;VENTOLIN HFA) 108 (90 Base) MCG/ACT inhaler Inhale 1 puff into the lungs every 6 (six) hours as needed for wheezing or shortness of breath.    [provider]   amitriptyline (ELAVIL) 10 MG tablet Take 1 tablet (10 mg total) by mouth at bedtime. 12/12/19   Kathie Dike, MD  atorvastatin (LIPITOR) 20 MG tablet Take 20 mg by mouth at bedtime.     [provider]  BD PEN NEEDLE NANO 2ND GEN 32G X 4 MM MISC SMARTSIG:Injection Daily 09/19/19   [provider]  Blood Glucose Monitoring Suppl (ONETOUCH VERIO REFLECT) w/Device KIT USE TO TEST BLOOD SUGAR 2 TIMES A DAY 01/19/19   [provider]  budesonide-formoterol (SYMBICORT) 160-4.5 MCG/ACT inhaler Inhale 2 puffs into the lungs every 12 (twelve) hours as needed (shortness of breath).     [provider]  DOCEtaxel (TAXOTERE IV) Inject 75 mg/m2 into the vein every 21 ( twenty-one) days. 01/08/20   [provider]  fentaNYL (DURAGESIC) 50 MCG/HR Place 1 patch onto the skin every 3 (three) days. 12/14/19   Kathie Dike, MD  Lactulose 20 GM/30ML SOLN Please take 30 ml every 3 hours until you produce a bowel movement. Then take 30 ml at bedtime daily for constipation. Patient taking differently: Take 20 g by mouth at bedtime.  11/22/19   Derek Jack, MD  Lancets (ONETOUCH DELICA PLUS [id number]) MISC USE TO TEST BLOOD SUGAR 2 TIMES A DAY 01/19/19   [provider]  letrozole (FEMARA) 2.5 MG tablet Take 1 tablet (2.5 mg total) by mouth daily. 09/25/18   Lockamy, Randi L, NP-C  lidocaine-prilocaine (EMLA) cream Apply a small amount to port a cath site and cover with plastic  wrap 1 hour prior to chemotherapy appointments 12/01/19   Derek Jack, MD  loratadine (CLARITIN) 10 MG tablet Take 10 mg by mouth daily as needed for allergies.    [provider]  magnesium oxide (MAG-OX) 400 (241.3 Mg) MG tablet Take 1 tablet (400 mg total) by mouth 2 (two) times daily. 12/12/19   Kathie Dike, MD  meclizine (ANTIVERT) 25 MG tablet Take 25 mg by mouth every 12 (twelve) hours as needed for dizziness.  03/14/19   [provider]  megestrol  (MEGACE) 20 MG tablet Take 20 mg by mouth 2 (two) times daily.    [provider]  metoprolol tartrate (LOPRESSOR) 50 MG tablet Take 1 tablet (50 mg total) by mouth 2 (two) times daily. 12/12/19   Kathie Dike, MD  Multiple Vitamins-Minerals (PRESERVISION AREDS 2) CAPS Take 1 capsule by mouth daily.    [provider]  naloxegol oxalate (MOVANTIK) 25 MG TABS tablet Take 1 tablet (25 mg total) by mouth daily. 01/13/17   Bayard Hugger, NP  ondansetron (ZOFRAN) 4 MG tablet Take 1 tablet (4 mg total) by mouth 2 (two) times daily. 12/25/19   Derek Jack, MD  [facility] VERIO test strip 1 each by Other route 2 (two) times daily.  01/19/19   [provider]  oxyCODONE (OXY IR/ROXICODONE) 5 MG immediate release tablet Take 1 tablet (5 mg total) by mouth every 4 (four) hours as needed for moderate pain. 12/12/19   Kathie Dike, MD  pegfilgrastim-jmdb (FULPHILA) 6 MG/0.6ML injection Inject 6 mg into the skin every 21 ( twenty-one) days. 01/10/20   [provider]  pertuzumab in sodium chloride 0.9 % 250 mL Inject 420 mg into the vein every 21 ( twenty-one) days. 01/08/20   [provider]  polyethylene glycol (MIRALAX / GLYCOLAX) 17 g packet Take 17 g by mouth daily. 12/05/19   Orson Eva, MD  prochlorperazine (COMPAZINE) 10 MG tablet Take 1 tablet (10 mg total) by mouth every 6 (six) hours as needed (Nausea or vomiting). 12/01/19   Derek Jack, MD  TRASTUZUMAB IV Inject 6 mg/kg into the vein every 21 ( twenty-one) days. 01/08/20   [provider]     Family History  Problem Relation Age of Onset  . Colon cancer Neg Hx     Social History   Socioeconomic History  . Marital status: Married    Spouse name: Not on file  . Number of children: Not on file  . Years of education: Not on file  . Highest education level: Not on file  Occupational History  . Occupation: Disabled  Tobacco Use  . Smoking status: Never Smoker  .  Smokeless tobacco: Never Used  Substance and Sexual Activity  . Alcohol use: No    Alcohol/week: 0.0 standard drinks  . Drug use: No  . Sexual activity: Yes  Other Topics Concern  . Not on file  Social History Narrative  . Not on file   Social Determinants of Health   Financial Resource Strain: Low Risk   . Difficulty of Paying Living Expenses: Not hard at all  Food Insecurity: No Food Insecurity  . Worried About Charity fundraiser in the Last Year: Never true  . Ran Out of Food in the Last Year: Never true  Transportation Needs: No Transportation Needs  . Lack of Transportation (Medical): No  . Lack of Transportation (Non-Medical): No  Physical Activity: Inactive  . Days of Exercise per Week: 0 days  . Minutes  of Exercise per Session: 0 min  Stress: No Stress Concern Present  . Feeling of Stress : Not at all  Social Connections: Moderately Integrated  . Frequency of Communication with Friends and Family: More than three times a week  . Frequency of Social Gatherings with Friends and Family: Three times a week  . Attends Religious Services: 1 to 4 times per year  . Active Member of Clubs or Organizations: No  . Attends Archivist Meetings: Never  . Marital Status: Married     Review of Systems: A 12 point ROS discussed and pertinent positives are indicated in the HPI above.  All other systems are negative.  Review of Systems  Constitutional: Positive for appetite change and unexpected weight change. Negative for fatigue and fever.  Respiratory: Negative for cough and shortness of breath.   Gastrointestinal: Negative for abdominal pain.  Musculoskeletal: Positive for back pain.  Neurological: Positive for weakness.  Psychiatric/Behavioral: Negative for behavioral problems and confusion.    Vital Signs: BP 119/80   Pulse (!) 114   Temp 99.2 F (37.3 C) (Oral)   Resp 16   Ht 5' 3" (1.6 m)   Wt 107 lb (48.5 kg)   SpO2 100%   BMI 18.95 kg/m   Physical  Exam Vitals reviewed.  Cardiovascular:     Rate and Rhythm: Normal rate and regular rhythm.     Heart sounds: Normal heart sounds.  Pulmonary:     Effort: Pulmonary effort is normal.     Breath sounds: Normal breath sounds.  Abdominal:     Palpations: Abdomen is soft.  Musculoskeletal:        General: Normal range of motion.  Skin:    General: Skin is warm.  Neurological:     Mental Status: She is alert and oriented to person, place, and time.  Psychiatric:        Behavior: Behavior normal.     Imaging: DG OP Swallowing Func-Medicare/Speech Path  Result Date: 01/06/2020 [place], [place], [address] Phone: [phone]   Fax:  [phone]  Modified Barium Swallow  Patient Details Name: MISSOURI LAPAGLIA MRN: [id number] Date of Birth: [birth year] No data recorded  Encounter Date: 01/06/2020        End of Session - 01/06/20 1234        Visit Number 1    Number of Visits 1    Authorization Type BCBS Comm PPO    SLP Start Time 5573    SLP Stop Time  1202    SLP Time Calculation (min) 27 min    Activity Tolerance Patient tolerated treatment well            Past Medical History: Diagnosis Date . Asthma   . Back pain   . Breast cancer ([place]) 2008   left . Diabetes ([place])   . Hypercholesterolemia   . Hypertension   . Neuropathy     extremities after chemo . Personal history of chemotherapy   . Personal history of radiation therapy 2008 . Port-A-Cath in place 12/01/2019       Past Surgical History: Procedure Laterality Date . BACK SURGERY     . BREAST LUMPECTOMY     . COLONOSCOPY       about 3 yrs ago in [place] . KNEE SURGERY       right . TONSILLECTOMY       There were no vitals filed for this visit.  Subjective Assessment - 01/06/20 1222        Subjective "I have trouble swallowing sandwhiches, meats, and pasta."    Special Tests MBSS    Currently in Pain? No/denies                General - 01/06/20 1224          General  Information   Date of Onset 12/02/19    HPI Maite Burlison  is a 59 y.o. female, with past medical history of hypertension, hyperlipidemia, chemotherapy-induced neuropathy, diabetes mellitus, history of breast cancer visually diagnosed in 2008, status post radiation therapy and chemotherapy,  with recent diagnosis of metastatic disease to liver and osseous metastasis, patient was recently diagnosed with lumbar/thoracic spine osseous metastasis, liver metastasis as well. Pt MRI showed: suspected nondisplaced pathologic fracture involving the C2 vertebral body and base of dens. She reported dysphagia to meats and breads following that and is referred for MBSS by Dr. Derek Jack. Pt reports slight improvement, but still has some trouble at times.    Type of Study MBS-Modified Barium Swallow Study    Previous Swallow Assessment N/A    Diet Prior to this Study Regular;Thin liquids    Temperature Spikes Noted No    Respiratory Status Room air    History of Recent Intubation No    Behavior/Cognition Alert;Cooperative;Pleasant mood    Oral Cavity Assessment Within Functional Limits    Oral Care Completed by SLP No    Oral Cavity - Dentition Adequate natural dentition    Vision Functional for self feeding    Self-Feeding Abilities Able to feed self    Patient Positioning Upright in chair    Baseline Vocal Quality Normal    Volitional Cough Strong    Volitional Swallow Able to elicit    Anatomy Within functional limits    Pharyngeal Secretions Not observed secondary MBS                Oral Preparation/Oral Phase - 01/06/20 1225          Oral Preparation/Oral Phase   Oral Phase Within functional limits      Electrical stimulation - Oral Phase   Was Electrical Stimulation Used No               Pharyngeal Phase - 01/06/20 1225          Pharyngeal Phase   Pharyngeal Phase Impaired      Pharyngeal - Thin   Pharyngeal- Thin Teaspoon Within functional limits;Swallow initiation at vallecula    Pharyngeal- Thin Cup Swallow  initiation at vallecula;Pharyngeal residue - valleculae;Other (Comment)    Pharyngeal- Thin Straw Swallow initiation at vallecula;Pharyngeal residue - valleculae;Pharyngeal residue - posterior pharnyx      Pharyngeal - Solids   Pharyngeal- Puree Swallow initiation at vallecula;Pharyngeal residue - valleculae    Pharyngeal- Regular Pharyngeal residue - cp segment    Pharyngeal- Pill Other (Comment) untitled image  stasis at CP segment, but then clears with liquid wash     Pharyngeal Phase - Comment   Pharyngeal Comment Pt with slight swelling of posterior pharyngeal wall ~C1-2 which creates a bulge during the swallow and results with in residuals along posterior pharyngeal wall and valleculae and clears with secondary swallow      Electrical Stimulation - Pharyngeal Phase   Was Electrical Stimulation Used No               Cricopharyngeal Phase - 01/06/20 1232  Cervical Esophageal Phase   Cervical Esophageal Phase Within functional limits                 SLP Short Term Goals - 01/06/20 1239          SLP SHORT TERM GOAL #1   Title N/A                Plan - 01/06/20 1235        Clinical Impression Statement Pt presents with mild pharyngeal phase dysphagia characterized by swallow trigger at the level of the valleculae across textures and consistencies, min reduced tongue base approximation with posterior pharyngeal wall near C1-2 due to slight swelling along posterior pharyngeal wall which creates a bulge with swallow trapping tail of bolus along posterior pharyngeal wall and valleculae. This results in min vallecular and posterior pharyngeal wall residuals which clear with a dry swallow and/or liquid wash. No penetration or aspiration observed. The barium tablet was briefly delayed before entering the UES, however passed through with liquid wash. Suspect symptoms will continue to improve as swelling continues to dissipate along C1-2 posterior pharyngeal wall. Recommend regular textures and thin liquids, however  Pt may wish to modify regular textures by chopping meats well and adding moisture, swallow 2x for each bite, and follow solids with a sip of liquid. Pt is taking her medications crushed in puree at this time and she can continue to do this if she is more comfortable, or try whole in puree or whole with water. No further SLP services indicated at this time.    Consulted and Agree with Plan of Care Patient         Patient will benefit from skilled therapeutic intervention in order to improve the following deficits and impairments:  Dysphagia, oropharyngeal phase         Recommendations/Treatment - 01/06/20 1232          Swallow Evaluation Recommendations   SLP Diet Recommendations Age appropriate regular;Thin    Liquid Administration via Cup;Straw    Medication Administration Whole meds with liquid untitled image  can try whole in puree, liquid wash, Pt prefers crushed in puree now   Supervision Patient able to self feed    Compensations Multiple dry swallows after each bite/sip;Follow solids with liquid    Postural Changes Seated upright at 90 degrees;Remain upright for at least 30 minutes after feeds/meals               Prognosis - 01/06/20 1233          Prognosis   Prognosis for Safe Diet Advancement Good untitled image  expect improvement as swelling continues to subside along C-1-2     Individuals Consulted   Consulted and Agree with Results and Recommendations Patient    Report Sent to  Referring physician         Problem List    Patient Active Problem List   Diagnosis Date Noted . Protein-calorie malnutrition, severe 12/10/2019 . Cancer associated pain 12/10/2019 . DNR (do not resuscitate) discussion   . Encounter for hospice care discussion   . Acute metabolic encephalopathy 02/54/2706 . Overdose opiate, accidental or unintentional, initial encounter ([place]) 12/08/2019 . Weakness   . Cancer related pain   . Palliative care by specialist   . Port-A-Cath in place 12/01/2019 . Hypercalcemia 11/29/2019 . Goals of  care, counseling/discussion 11/28/2019 . Stage IV Metastatic Breast Cancer with Mets to Bone/Liver/Lungs-- 11/28/2019 . Lumbar facet arthropathy 10/07/2015 . Bile duct abnormality 05/08/2015 .  Constipation due to opioid therapy 05/08/2015 . Genetic testing 03/17/2015 . Osteopenia determined by x-ray 03/13/2015 . Lumbar spondylosis 02/11/2015 . Lumbar radiculopathy 02/11/2015 . Chemotherapy-induced peripheral neuropathy ([place]) 02/11/2015 . Vitamin D deficiency 02/02/2015 . Stage IV Breast cancer of upper-outer quadrant of left female breast ([place]) 12/22/2014 . High risk medication use 12/22/2014   Thank you,  Genene Churn, [place]  [facility] 01/06/2020, 12:58 PM  [place], [place], [address] Phone: [phone]   Fax:  [phone]  Name: ARITHA HUCKEBA MRN: [id number] Date of Birth: 1960-09-12    CLINICAL DATA:  Dysphagia especially for breads and meats, recent C2 fracture, metastatic breast cancer EXAM: MODIFIED BARIUM SWALLOW TECHNIQUE: Different consistencies of barium were administered orally to the patient by the Speech Pathologist. Imaging of the pharynx was performed in the lateral projection. The radiologist was present in the fluoroscopy room for this study, providing personal supervision. FLUOROSCOPY TIME:  Fluoroscopy Time:  2 minutes 24 seconds Radiation Exposure Index (if provided by the fluoroscopic device): 13.7 mGy Number of Acquired Spot Images: multiple fluoroscopic screen captures COMPARISON:  None FINDINGS: Thin barium, applesauce consistency, cracker consistency and a 12.5 mm diameter barium tablet were utilized. No laryngeal penetration or aspiration of any evaluated consistencies. Minimal piriform sinus and vallecular residuals with thin barium, cleared by a second swallow. Remainder of swallowing exam unremarkable. Barium tablet passed to stomach without obstruction. Minimal prevertebral soft tissue prominence at  C2 consistent with history of recent C2 fracture. IMPRESSION: Minimal piriform sinus and vallecular residuals with thin barium. Remainder of exam normal. Please refer to the Speech Pathologists report for complete details and recommendations. Electronically Signed   By: Lavonia Dana M.D.   On: 01/06/2020 12:27    Labs:  CBC: Recent Labs    12/08/19 1131 12/09/19 0446 12/11/19 0539 12/25/19 0849  WBC 9.9 8.3 10.4 8.1  HGB 8.4* 7.8* 8.8* 9.4*  HCT 27.5* 26.5* 28.7* 31.5*  PLT 304 280 306 350    COAGS: Recent Labs    11/19/19 1214 11/29/19 1437 11/30/19 0457  INR 1.1 1.1 1.3*  APTT  --  31  --     BMP: Recent Labs    03/27/19 1257 03/27/19 1257 09/10/19 1338 09/10/19 1338 09/20/19 1118 09/20/19 1118 11/01/19 1942 11/28/19 1302 12/09/19 0446 12/11/19 0539 12/12/19 0419 12/25/19 0849  NA 134*   < > 139   < > 141   < > 132*   < > 137 136 136 137  K 3.9   < > 4.2   < > 3.2*   < > 3.4*   < > 3.6 2.6* 3.2* 4.1  CL 95*   < > 100   < > 102   < > 93*   < > 106 100 101 99  CO2 28   < > 33*   < > 29   < > 24   < > _0 GLUCOSE 209*   < > 160*   < > 131*   < > 279*   < > 150* 134* 89 131*  BUN 16   < > 12   < > 13   < > 13   < > 16 5* 5* 8  CALCIUM 9.9   < > 10.3   < > 10.0   < > 9.1   < > 8.9 8.9 8.8* 10.5*  CREATININE 0.89   < > 1.06*  --  0.81   < > 0.81   < > 0.74 0.59 0.68 0.92  GFRNONAA >60   < > 57*  --  >60   < > >60   < > >60 >60 >60 >60  GFRAA >60  --  66  --  >60  --  >60  --   --   --   --   --    < > = values in this interval not displayed.    LIVER FUNCTION TESTS: Recent Labs    12/05/19 0502 12/08/19 1131 12/11/19 0539 12/25/19 0849  BILITOT 0.6 0.8 1.0 0.5  AST 117* 73* 165* 81*  ALT 47* 39 70* 25  ALKPHOS 184* 206* 217* 269*  PROT 7.3 8.2* 7.7 9.1*  ALBUMIN 2.6* 2.8* 2.5* 3.0*    TUMOR MARKERS: No results for input(s): AFPTM, CEA, CA199, CHROMGRNA in the last 8760 hours.  Assessment and Plan:  Hx breast ca 2008 New mets to liver  and bone To start chemo tomorrow Planned now for [facility] a cath placement Risks and benefits of image guided port-a-catheter placement was discussed with the patient including, but not limited to bleeding, infection, pneumothorax, or fibrin sheath development and need for additional procedures.  All of the patient's questions were answered, patient is agreeable to proceed. Consent signed and in chart.   Thank you for this interesting consult.  I greatly enjoyed meeting NOEMY HALLMON and look forward to participating in their care.  A copy of this report was sent to the requesting provider on this date.  Electronically Signed: Lavonia Drafts, PA-C 01/07/2020, 1:02 PM   I spent a total of  30 Minutes   in face to face in clinical consultation, greater than 50% of which was counseling/coordinating care for [facility] placement

## 2020-01-07 NOTE — Progress Notes (Signed)
IR consulted by Dr. Delton Coombes for possible image-guided Port-a-cath placement. Patient was scheduled for procedure tentatively for today in IR.  Unfortunately, there were multiple inpatient emergent IR procedures today- unable to accommodate patient's procedure secondary to this. Informed patient and the RN of her oncologist of above. Patient will be rescheduled for procedure in IR (tentativley for 12/9 again pending scheduling)- IR schedulers to call patient today to confirm date/timing of procedure. All questions answered and concerns addressed.  Please call IR with questions/concerns.   Bea Graff Reymond Maynez, PA-C 01/07/2020, 2:53 PM

## 2020-01-08 ENCOUNTER — Ambulatory Visit (HOSPITAL_COMMUNITY): Payer: BC Managed Care – PPO

## 2020-01-08 ENCOUNTER — Ambulatory Visit (HOSPITAL_COMMUNITY): Payer: BC Managed Care – PPO | Admitting: Hematology and Oncology

## 2020-01-08 ENCOUNTER — Other Ambulatory Visit: Payer: Self-pay | Admitting: Student

## 2020-01-08 ENCOUNTER — Other Ambulatory Visit (HOSPITAL_COMMUNITY): Payer: BC Managed Care – PPO

## 2020-01-09 ENCOUNTER — Other Ambulatory Visit: Payer: Self-pay

## 2020-01-09 ENCOUNTER — Encounter (HOSPITAL_COMMUNITY): Payer: Self-pay

## 2020-01-09 ENCOUNTER — Ambulatory Visit (HOSPITAL_COMMUNITY)
Admission: RE | Admit: 2020-01-09 | Discharge: 2020-01-09 | Disposition: A | Discharge status: Home / Self Care | Payer: BC Managed Care – PPO | Source: Ambulatory Visit | Attending: Hematology | Admitting: Hematology

## 2020-01-09 DIAGNOSIS — C787 Secondary malignant neoplasm of liver and intrahepatic bile duct: Secondary | ICD-10-CM | POA: Insufficient documentation

## 2020-01-09 DIAGNOSIS — C7951 Secondary malignant neoplasm of bone: Secondary | ICD-10-CM | POA: Insufficient documentation

## 2020-01-09 DIAGNOSIS — C50919 Malignant neoplasm of unspecified site of unspecified female breast: Secondary | ICD-10-CM | POA: Diagnosis not present

## 2020-01-09 DIAGNOSIS — C50912 Malignant neoplasm of unspecified site of left female breast: Secondary | ICD-10-CM | POA: Diagnosis not present

## 2020-01-09 DIAGNOSIS — C50412 Malignant neoplasm of upper-outer quadrant of left female breast: Secondary | ICD-10-CM | POA: Diagnosis not present

## 2020-01-09 DIAGNOSIS — Z452 Encounter for adjustment and management of vascular access device: Secondary | ICD-10-CM | POA: Diagnosis not present

## 2020-01-09 DIAGNOSIS — Z9221 Personal history of antineoplastic chemotherapy: Secondary | ICD-10-CM | POA: Diagnosis not present

## 2020-01-09 HISTORY — PX: IR IMAGING GUIDED PORT INSERTION: IMG5740

## 2020-01-09 LAB — GLUCOSE, CAPILLARY: Glucose-Capillary: 71 mg/dL (ref 70–99)

## 2020-01-09 MED ORDER — MIDAZOLAM HCL 2 MG/2ML IJ SOLN
INTRAMUSCULAR | Status: AC | PRN
  Administered 2020-01-09: 1 mg via INTRAVENOUS

## 2020-01-09 MED ORDER — HEPARIN SOD (PORK) LOCK FLUSH 100 UNIT/ML IV SOLN
INTRAVENOUS | Status: AC
  Filled 2020-01-09: qty 5

## 2020-01-09 MED ORDER — LIDOCAINE-EPINEPHRINE 1 %-1:100000 IJ SOLN
INTRAMUSCULAR | Status: AC | PRN
  Administered 2020-01-09: 20 mL via INTRADERMAL

## 2020-01-09 MED ORDER — HEPARIN SOD (PORK) LOCK FLUSH 100 UNIT/ML IV SOLN
INTRAVENOUS | Status: AC | PRN
  Administered 2020-01-09: 500 [IU] via INTRAVENOUS

## 2020-01-09 MED ORDER — CEFAZOLIN SODIUM-DEXTROSE 2-4 GM/100ML-% IV SOLN
INTRAVENOUS | Status: AC
  Filled 2020-01-09: qty 100

## 2020-01-09 MED ORDER — SODIUM CHLORIDE 0.9 % IV SOLN: INTRAVENOUS | Status: DC

## 2020-01-09 MED ORDER — FENTANYL CITRATE (PF) 100 MCG/2ML IJ SOLN
INTRAMUSCULAR | Status: AC | PRN
Start: 2020-01-09 — End: 2020-01-09
  Administered 2020-01-09: 25 ug via INTRAVENOUS

## 2020-01-09 MED ORDER — CEFAZOLIN SODIUM-DEXTROSE 2-4 GM/100ML-% IV SOLN
2.0000 g | Freq: Once | INTRAVENOUS | Status: AC
  Administered 2020-01-09: 2 g via INTRAVENOUS

## 2020-01-09 MED ORDER — MIDAZOLAM HCL 2 MG/2ML IJ SOLN
INTRAMUSCULAR | Status: AC
  Filled 2020-01-09: qty 2

## 2020-01-09 MED ORDER — LIDOCAINE-EPINEPHRINE 1 %-1:100000 IJ SOLN
INTRAMUSCULAR | Status: AC
  Filled 2020-01-09: qty 1

## 2020-01-09 MED ORDER — FENTANYL CITRATE (PF) 100 MCG/2ML IJ SOLN
INTRAMUSCULAR | Status: AC
  Filled 2020-01-09: qty 2

## 2020-01-09 NOTE — Discharge Instructions (Signed)

## 2020-01-09 NOTE — H&P (Signed)
Patient Status: [facility] - Out-pt  Assessment and Plan: Patient in need of venous access for chemotherapy initiation.  Patient with past medical history of breast cancer presents with complaint of new metastatic disease.  She has had prior Port placements (2010 and 2012) and is familiar with use and care.   Patient presents today in their usual state of health.  She has been NPO and is not currently on blood thinners.   Risks and benefits of image guided port-a-catheter placement was discussed with the patient including, but not limited to bleeding, infection, pneumothorax, or fibrin sheath development and need for additional procedures.  All of the patient's questions were answered, patient is agreeable to proceed. Consent signed and in chart.  ______________________________________________________________________   History of Present Illness: Krista Doyle is a 59 y.o. 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  She now presents for Port-A-Cath placement for resumption of treatment.  She has had prior Port placements (2010 and 2012).   Allergies and medications reviewed.   Review of Systems: A 12 point ROS discussed and pertinent positives are indicated in the HPI above.  All other systems are negative.  Review of Systems  Constitutional: Negative for fatigue and fever.  Respiratory: Negative for cough and shortness of breath.   Cardiovascular: Negative for chest pain.  Gastrointestinal: Negative for abdominal pain, nausea and vomiting.  Genitourinary: Negative for dysuria.  Musculoskeletal: Negative for back pain.  Psychiatric/Behavioral: Negative for behavioral problems and confusion.    Vital Signs: BP (!) 151/90   Pulse (!) 132   Temp 98.8 F (37.1 C) (Oral)   Resp 16   Ht 5\' 4"  (1.626 m)   Wt 104 lb (47.2 kg)   SpO2 100%   BMI 17.85 kg/m   Physical Exam Vitals and nursing  note reviewed.  Constitutional:      General: She is not in acute distress.    Appearance: Normal appearance. She is not ill-appearing.  HENT:     Mouth/Throat:     Mouth: Mucous membranes are moist.     Pharynx: Oropharynx is clear.  Cardiovascular:     Rate and Rhythm: Normal rate and regular rhythm.     Pulses: Normal pulses.  Pulmonary:     Effort: Pulmonary effort is normal. No respiratory distress.     Breath sounds: Normal breath sounds.  Musculoskeletal:     Cervical back: Normal range of motion and neck supple.  Skin:    General: Skin is warm and dry.  Neurological:     General: No focal deficit present.     Mental Status: She is alert and oriented to person, place, and time. Mental status is at baseline.  Psychiatric:        Mood and Affect: Mood normal.        Behavior: Behavior normal.        Thought Content: Thought content normal.        Judgment: Judgment normal.      Imaging reviewed.   Labs:  COAGS: Recent Labs    11/19/19 1214 11/29/19 1437 11/30/19 0457  INR 1.1 1.1 1.3*  APTT  --  31  --     BMP: Recent Labs    03/27/19 1257 09/10/19 1338 09/20/19 1118 11/01/19 1942 11/28/19 1302 12/09/19 0446 12/11/19 0539 12/12/19 0419 12/25/19 0849  NA 134* 139 141 132*   < > 137 136 136 137  K 3.9 4.2 3.2* 3.4*   < > 3.6 2.6* 3.2* 4.1  CL 95* 100 102 93*   < > 106 100 101 99  CO2 28 33* 29 24   < > 23 23 23 26   GLUCOSE 209* 160* 131* 279*   < > 150* 134* 89 131*  BUN 16 12 13 13    < > 16 5* 5* 8  CALCIUM 9.9 10.3 10.0 9.1   < > 8.9 8.9 8.8* 10.5*  CREATININE 0.89 1.06* 0.81 0.81   < > 0.74 0.59 0.68 0.92  GFRNONAA >60 57* >60 >60   < > >60 >60 >60 >60  GFRAA >60 66 >60 >60  --   --   --   --   --    < > = values in this interval not displayed.       Electronically Signed: Docia Barrier, PA 01/09/2020, 2:30 PM   I spent a total of 15 minutes in face to face in clinical consultation, greater than 50% of which was  counseling/coordinating care for metastatic breast cancer.

## 2020-01-09 NOTE — Procedures (Signed)
  Procedure: R IJ Port catheter placement   EBL:   minimal Complications:  none immediate  See full dictation in [facility] PACS.  D. Jayden Kratochvil MD Main # [phone] Pager  [phone] Mobile [phone]     

## 2020-01-10 ENCOUNTER — Ambulatory Visit (HOSPITAL_COMMUNITY): Payer: BC Managed Care – PPO

## 2020-01-12 NOTE — Progress Notes (Signed)
[facility] [address], [place] [address]   CLINIC:  Medical Oncology/Hematology  PCP:  [name], MD [place] [facility] [place] [id number] [phone]   REASON FOR VISIT:   Follow-up for metastatic left breast cancer to liver  PRIOR THERAPY:  1. Completed 1 year of Herceptin. 2. Tamoxifen for 5 years and Femara for 5 years.  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--  11/28/2019 Initial Diagnosis   Metastatic breast cancer ([place])   01/13/2020 -  Chemotherapy   The patient had ondansetron (ZOFRAN) injection 8 mg, 8 mg (100 % of original dose 8 mg), Intravenous,  Once, 1 of 12 cycles Dose modification: 8 mg (original dose 8 mg, Cycle 1) pegfilgrastim-jmdb (FULPHILA) injection 6 mg, 6 mg, Subcutaneous,  Once, 1 of 8 cycles DOCEtaxel (TAXOTERE) 70 mg in sodium chloride 0.9 % 150 mL chemo infusion, 45 mg/m2 = 70 mg (60 % of original dose 75 mg/m2), Intravenous,  Once, 1 of 8 cycles Dose modification: 45 mg/m2 (60 % of original dose 75 mg/m2, Cycle 1, Reason: Provider Judgment) pertuzumab (PERJETA) 840 mg in sodium chloride 0.9 % 250 mL chemo infusion, 840 mg, Intravenous, Once, 1 of 12 cycles trastuzumab-dkst (OGIVRI) 399 mg in sodium chloride 0.9 % 250 mL chemo infusion, 8 mg/kg = 399 mg, Intravenous,  Once, 1 of 12 cycles  for chemotherapy treatment.      CANCER STAGING: Cancer Staging No matching staging information was found for the patient.  INTERVAL HISTORY:  Krista Doyle, a 59 y.o. female, returns for routine follow-up and consideration for first cycle of chemotherapy She complains of new onset LLE swelling,  Neck pain is better. She otherwise denies any changes in breathing No diarrhea, dysuria, fevers or chills No headaches, falls,  double vision or seizures.  REVIEW OF SYSTEMS:  Review of Systems  Constitutional: Positive for appetite change (50%) and fatigue (50%).  HENT:   Negative for trouble swallowing (choking).   Respiratory: Negative for chest tightness and shortness of breath.   Cardiovascular: Negative for chest pain.  Gastrointestinal: Positive for constipation.  Musculoskeletal: Positive for back pain (8/10 back pain) and neck pain.  Neurological: Positive for numbness.  Psychiatric/Behavioral: Positive for sleep disturbance.  All other systems reviewed and are negative.   PAST MEDICAL/SURGICAL HISTORY:  Past Medical History:  Diagnosis Date  . Asthma   . Back pain   . Breast cancer ([facility]) 2008   left  . Diabetes ([facility])   . Hypercholesterolemia   . Hypertension   . 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 Personal history of chemotherapy   . Personal history of radiation therapy 2008  . Port-A-Cath in place 12/01/2019   Past Surgical History:  Procedure Laterality Date  . BACK SURGERY    . BREAST LUMPECTOMY    . COLONOSCOPY     about 3 yrs ago [place]  . IR IMAGING GUIDED PORT INSERTION  01/09/2020  . KNEE SURGERY     right  . TONSILLECTOMY      SOCIAL HISTORY:  Social History   Socioeconomic History  . Marital status: Married    Spouse name: Not on file  . Number of children: Not on file  . Years of education: Not on file  . Highest education level: Not on  file  Occupational History  . Occupation: Disabled  Tobacco Use  . Smoking status: Never Smoker  . Smokeless tobacco: Never Used  Substance and Sexual Activity  . Alcohol use: No    Alcohol/week: 0.0 standard drinks  . Drug use: No  . Sexual activity: Yes  Other Topics Concern  . Not on file  Social History Narrative  . Not on file   Social Determinants of Health   Financial Resource Strain: Low Risk   . Difficulty of Paying Living Expenses: Not hard at all  Food Insecurity: No Food Insecurity  . Worried  About Charity fundraiser in the Last Year: Never true  . Ran Out of Food in the Last Year: Never true  Transportation Needs: No Transportation Needs  . Lack of Transportation (Medical): No  . Lack of Transportation (Non-Medical): No  Physical Activity: Inactive  . Days of Exercise per Week: 0 days  . Minutes of Exercise per Session: 0 min  Stress: No Stress Concern Present  . Feeling of Stress : Not at all  Social Connections: Moderately Integrated  . Frequency of Communication with Friends and Family: More than three times a week  . Frequency of Social Gatherings with Friends and Family: Three times a week  . Attends Religious Services: 1 to 4 times per year  . Active Member of Clubs or Organizations: No  . Attends Archivist Meetings: Never  . Marital Status: Married  Human resources officer Violence: Not At Risk  . Fear of Current or Ex-Partner: No  . Emotionally Abused: No  . Physically Abused: No  . Sexually Abused: No    FAMILY HISTORY:  Family History  Problem Relation Age of Onset  . Colon cancer Neg Hx     CURRENT MEDICATIONS:  Current Outpatient Medications  Medication Sig Dispense Refill  . albuterol (PROVENTIL HFA;VENTOLIN HFA) 108 (90 Base) MCG/ACT inhaler Inhale 1 puff into the lungs every 6 (six) hours as needed for wheezing or shortness of breath.    Marland Kitchen amitriptyline (ELAVIL) 10 MG tablet Take 1 tablet (10 mg total) by mouth at bedtime.    Marland Kitchen atorvastatin (LIPITOR) 20 MG tablet Take 20 mg by mouth at bedtime.     . BD PEN NEEDLE NANO 2ND GEN 32G X 4 MM MISC SMARTSIG:Injection Daily    . budesonide-formoterol (SYMBICORT) 160-4.5 MCG/ACT inhaler Inhale 2 puffs into the lungs every 12 (twelve) hours as needed (shortness of breath).     . fentaNYL (DURAGESIC) 50 MCG/HR Place 1 patch onto the skin every 3 (three) days. 5 patch 0  . Lactulose 20 GM/30ML SOLN Please take 30 ml every 3 hours until you produce a bowel movement. Then take 30 ml at bedtime daily for  constipation. (Patient taking differently: Take 20 g by mouth every other day.) 946 mL 2  . Lancets (ONETOUCH DELICA PLUS [id number]) MISC USE TO TEST BLOOD SUGAR 2 TIMES A DAY    . letrozole (FEMARA) 2.5 MG tablet Take 1 tablet (2.5 mg total) by mouth daily. 90 tablet 3  . loratadine (CLARITIN) 10 MG tablet Take 10 mg by mouth daily as needed for allergies.    Marland Kitchen losartan (COZAAR) 100 MG tablet Take 100 mg by mouth daily.    . magnesium oxide (MAG-OX) 400 (241.3 Mg) MG tablet Take 1 tablet (400 mg total) by mouth 2 (two) times daily.    . meclizine (ANTIVERT) 25 MG tablet Take 25 mg by mouth every 12 (twelve) hours as needed  for dizziness.     . megestrol (MEGACE) 20 MG tablet Take 20 mg by mouth 2 (two) times daily.    . metoprolol tartrate (LOPRESSOR) 50 MG tablet Take 1 tablet (50 mg total) by mouth 2 (two) times daily.    . Multiple Vitamins-Minerals (PRESERVISION AREDS 2) CAPS Take 1 capsule by mouth daily.    . naloxegol oxalate (MOVANTIK) 25 MG TABS tablet Take 1 tablet (25 mg total) by mouth daily. 30 tablet 3  . ONETOUCH VERIO test strip 1 each by Other route 2 (two) times daily.     Marland Kitchen oxyCODONE (OXY IR/ROXICODONE) 5 MG immediate release tablet Take 1 tablet (5 mg total) by mouth every 4 (four) hours as needed for moderate pain. 30 tablet 0  . polyethylene glycol (MIRALAX / GLYCOLAX) 17 g packet Take 17 g by mouth daily. (Patient taking differently: Take 17 g by mouth daily as needed for mild constipation.) 14 each 0  . Vitamin D, Ergocalciferol, (DRISDOL) 1.25 MG (50000 UNIT) CAPS capsule Take 50,000 Units by mouth once a week.    . lidocaine-prilocaine (EMLA) cream Apply a small amount to port a cath site and cover with plastic wrap 1 hour prior to chemotherapy appointments (Patient not taking: Reported on 01/13/2020) 30 g 3  . ondansetron (ZOFRAN) 4 MG tablet Take 1 tablet (4 mg total) by mouth 2 (two) times daily. (Patient not taking: Reported on 01/13/2020) 20 tablet 1  .  prochlorperazine (COMPAZINE) 10 MG tablet Take 1 tablet (10 mg total) by mouth every 6 (six) hours as needed (Nausea or vomiting). (Patient not taking: Reported on 01/13/2020) 30 tablet 1   No current facility-administered medications for this visit.   Facility-Administered Medications Ordered in Other Visits  Medication Dose Route Frequency Provider Last Rate Last Admin  . dexamethasone (DECADRON) 10 mg in sodium chloride 0.9 % 50 mL IVPB  10 mg Intravenous Once Derek Jack, MD 204 mL/hr at 01/13/20 1115 10 mg at 01/13/20 1115  . DOCEtaxel (TAXOTERE) 70 mg in sodium chloride 0.9 % 150 mL chemo infusion  45 mg/m2 (Treatment Plan Recorded) Intravenous Once Derek Jack, MD      . heparin lock flush 100 unit/mL  500 Units Intracatheter Once PRN Derek Jack, MD      . pertuzumab (PERJETA) 840 mg in sodium chloride 0.9 % 250 mL chemo infusion  840 mg Intravenous Once Derek Jack, MD      . sodium chloride flush (NS) 0.9 % injection 10 mL  10 mL Intracatheter PRN Derek Jack, MD      . trastuzumab-dkst (OGIVRI) 399 mg in sodium chloride 0.9 % 250 mL chemo infusion  8 mg/kg (Treatment Plan Recorded) Intravenous Once Derek Jack, MD        ALLERGIES:  Allergies  Allergen Reactions  . 5-Alpha Reductase Inhibitors   . Other     Cat/dog dander  . Pollen Extract     PHYSICAL EXAM:  Performance status (ECOG): 1 - Symptomatic but completely ambulatory  Vitals:   01/13/20 0900  BP: (!) 142/76  Pulse: (!) 133  Resp: 17  Temp: (!) 97.4 F (36.3 C)  SpO2: 100%   Wt Readings from Last 3 Encounters:  01/13/20 107 lb (48.5 kg)  01/09/20 104 lb (47.2 kg)  01/07/20 107 lb (48.5 kg)   Physical Exam Vitals reviewed.  Constitutional:      Appearance: Normal appearance.     Interventions: Cervical collar in place.  Cardiovascular:     Rate and  Rhythm: Regular rhythm. Tachycardia present.     Pulses: Normal pulses.     Heart sounds: Normal  heart sounds.  Pulmonary:     Effort: Pulmonary effort is normal.     Breath sounds: Normal breath sounds.  Musculoskeletal:        General: Swelling present.     Left lower leg: Edema present.  Neurological:     General: No focal deficit present.     Mental Status: She is alert and oriented to person, place, and time.  Psychiatric:        Mood and Affect: Mood normal.        Behavior: Behavior normal.     LABORATORY DATA:  I have reviewed the labs as listed.  CBC Latest Ref Rng & Units 01/13/2020 12/25/2019 12/11/2019  WBC 4.0 - 10.5 K/uL 10.6(H) 8.1 10.4  Hemoglobin 12.0 - 15.0 g/dL 7.9(L) 9.4(L) 8.8(L)  Hematocrit 36.0 - 46.0 % 26.3(L) 31.5(L) 28.7(L)  Platelets 150 - 400 K/uL 315 350 306   CMP Latest Ref Rng & Units 01/13/2020 12/25/2019 12/12/2019  Glucose 70 - 99 mg/dL 125(H) 131(H) 89  BUN 8 - 23 mg/dL 14 8 5(L)  Creatinine 0.44 - 1.00 mg/dL 0.73 0.92 0.68  Sodium 135 - 145 mmol/L 133(L) 137 136  Potassium 3.5 - 5.1 mmol/L 3.9 4.1 3.2(L)  Chloride 98 - 111 mmol/L 95(L) 99 101  CO2 22 - 32 mmol/L _0 Calcium 8.9 - 10.3 mg/dL 9.7 10.5(H) 8.8(L)  Total Protein 6.5 - 8.1 g/dL 8.6(H) 9.1(H) -  Total Bilirubin 0.3 - 1.2 mg/dL 1.0 0.5 -  Alkaline Phos 38 - 126 U/L 340(H) 269(H) -  AST 15 - 41 U/L 99(H) 81(H) -  ALT 0 - 44 U/L 20 25 -    DIAGNOSTIC IMAGING:  I have independently reviewed the scans and discussed with the patient. DG OP Swallowing Func-Medicare/Speech Path  Result Date: 01/06/2020 [place], [place], [address] Phone: [phone]   Fax:  [phone]  Modified Barium Swallow  Patient Details Name: Krista Doyle MRN: [id number] Date of Birth: 01/09/1961 No data recorded  Encounter Date: 01/06/2020        End of Session - 01/06/20 1234        Visit Number 1    Number of Visits 1    Authorization Type BCBS Comm PPO    SLP Start Time 4818    SLP Stop Time  1202    SLP Time Calculation (min) 27  min    Activity Tolerance Patient tolerated treatment well            Past Medical History: Diagnosis Date . Asthma   . Back pain   . Breast cancer ([facility]) 2008   left . Diabetes ([facility])   . Hypercholesterolemia   . Hypertension   . Neuropathy     extremities after chemo . Personal history of chemotherapy   . Personal history of radiation therapy 2008 . Port-A-Cath in place 12/01/2019       Past Surgical History: Procedure Laterality Date . BACK SURGERY     . BREAST LUMPECTOMY     . COLONOSCOPY       about 3 yrs ago in [place] . KNEE SURGERY       right . TONSILLECTOMY       There were no vitals filed for this visit.        Subjective Assessment - 01/06/20  1222        Subjective "I have trouble swallowing sandwhiches, meats, and pasta."    Special Tests MBSS    Currently in Pain? No/denies                General - 01/06/20 1224          General Information   Date of Onset 12/02/19    HPI [place] Paiz  is a 59 y.o. female, with past medical history of hypertension, hyperlipidemia, chemotherapy-induced neuropathy, diabetes mellitus, history of breast cancer visually diagnosed in 2008, status post radiation therapy and chemotherapy,  with recent diagnosis of metastatic disease to liver and osseous metastasis, patient was recently diagnosed with lumbar/thoracic spine osseous metastasis, liver metastasis as well. Pt MRI showed: suspected nondisplaced pathologic fracture involving the C2 vertebral body and base of dens. She reported dysphagia to meats and breads following that and is referred for MBSS by Dr. Derek Jack. Pt reports slight improvement, but still has some trouble at times.    Type of Study MBS-Modified Barium Swallow Study    Previous Swallow Assessment N/A    Diet Prior to this Study Regular;Thin liquids    Temperature Spikes Noted No    Respiratory Status Room air    History of Recent Intubation No    Behavior/Cognition Alert;Cooperative;Pleasant mood    Oral Cavity Assessment Within Functional  Limits    Oral Care Completed by SLP No    Oral Cavity - Dentition Adequate natural dentition    Vision Functional for self feeding    Self-Feeding Abilities Able to feed self    Patient Positioning Upright in chair    Baseline Vocal Quality Normal    Volitional Cough Strong    Volitional Swallow Able to elicit    Anatomy Within functional limits    Pharyngeal Secretions Not observed secondary MBS                Oral Preparation/Oral Phase - 01/06/20 1225          Oral Preparation/Oral Phase   Oral Phase Within functional limits      Electrical stimulation - Oral Phase   Was Electrical Stimulation Used No               Pharyngeal Phase - 01/06/20 1225          Pharyngeal Phase   Pharyngeal Phase Impaired      Pharyngeal - Thin   Pharyngeal- Thin Teaspoon Within functional limits;Swallow initiation at vallecula    Pharyngeal- Thin Cup Swallow initiation at vallecula;Pharyngeal residue - valleculae;Other (Comment)    Pharyngeal- Thin Straw Swallow initiation at vallecula;Pharyngeal residue - valleculae;Pharyngeal residue - posterior pharnyx      Pharyngeal - Solids   Pharyngeal- Puree Swallow initiation at vallecula;Pharyngeal residue - valleculae    Pharyngeal- Regular Pharyngeal residue - cp segment    Pharyngeal- Pill Other (Comment) untitled image  stasis at CP segment, but then clears with liquid wash     Pharyngeal Phase - Comment   Pharyngeal Comment Pt with slight swelling of posterior pharyngeal wall ~C1-2 which creates a bulge during the swallow and results with in residuals along posterior pharyngeal wall and valleculae and clears with secondary swallow      Electrical Stimulation - Pharyngeal Phase   Was Electrical Stimulation Used No               Cricopharyngeal Phase - 01/06/20 1232          Cervical  Esophageal Phase   Cervical Esophageal Phase Within functional limits                 SLP Short Term Goals - 01/06/20 1239          SLP SHORT TERM GOAL #1   Title N/A                Plan - 01/06/20 1235         Clinical Impression Statement Pt presents with mild pharyngeal phase dysphagia characterized by swallow trigger at the level of the valleculae across textures and consistencies, min reduced tongue base approximation with posterior pharyngeal wall near C1-2 due to slight swelling along posterior pharyngeal wall which creates a bulge with swallow trapping tail of bolus along posterior pharyngeal wall and valleculae. This results in min vallecular and posterior pharyngeal wall residuals which clear with a dry swallow and/or liquid wash. No penetration or aspiration observed. The barium tablet was briefly delayed before entering the UES, however passed through with liquid wash. Suspect symptoms will continue to improve as swelling continues to dissipate along C1-2 posterior pharyngeal wall. Recommend regular textures and thin liquids, however Pt may wish to modify regular textures by chopping meats well and adding moisture, swallow 2x for each bite, and follow solids with a sip of liquid. Pt is taking her medications crushed in puree at this time and she can continue to do this if she is more comfortable, or try whole in puree or whole with water. No further SLP services indicated at this time.    Consulted and Agree with Plan of Care Patient         Patient will benefit from skilled therapeutic intervention in order to improve the following deficits and impairments:  Dysphagia, oropharyngeal phase         Recommendations/Treatment - 01/06/20 1232          Swallow Evaluation Recommendations   SLP Diet Recommendations Age appropriate regular;Thin    Liquid Administration via Cup;Straw    Medication Administration Whole meds with liquid untitled image  can try whole in puree, liquid wash, Pt prefers crushed in puree now   Supervision Patient able to self feed    Compensations Multiple dry swallows after each bite/sip;Follow solids with liquid    Postural Changes Seated upright at 90 degrees;Remain upright for at least  30 minutes after feeds/meals               Prognosis - 01/06/20 1233          Prognosis   Prognosis for Safe Diet Advancement Good untitled image  expect improvement as swelling continues to subside along C-1-2     Individuals Consulted   Consulted and Agree with Results and Recommendations Patient    Report Sent to  Referring physician         Problem List    Patient Active Problem List   Diagnosis Date Noted . Protein-calorie malnutrition, severe 12/10/2019 . Cancer associated pain 12/10/2019 . DNR (do not resuscitate) discussion   . Encounter for hospice care discussion   . Acute metabolic encephalopathy 81/85/6314 . Overdose opiate, accidental or unintentional, initial encounter ([facility]) 12/08/2019 . Weakness   . Cancer related pain   . Palliative care by specialist   . Port-A-Cath in place 12/01/2019 . Hypercalcemia 11/29/2019 . Goals of care, counseling/discussion 11/28/2019 . Stage IV Metastatic Breast Cancer with Mets to Bone/Liver/Lungs-- 11/28/2019 . Lumbar facet arthropathy 10/07/2015 . Bile duct abnormality 05/08/2015 . Constipation  due to opioid therapy 05/08/2015 . Genetic testing 03/17/2015 . Osteopenia determined by x-ray 03/13/2015 . Lumbar spondylosis 02/11/2015 . Lumbar radiculopathy 02/11/2015 . Chemotherapy-induced peripheral neuropathy ([facility]) 02/11/2015 . Vitamin D deficiency 02/02/2015 . Stage IV Breast cancer of upper-outer quadrant of left female breast ([place]) 12/22/2014 . High risk medication use 12/22/2014   Thank you,  Genene Churn, [place]  [facility] 01/06/2020, 12:58 PM  [place], [place], [address] Phone: [phone]   Fax:  [phone]  Name: Krista Doyle MRN: [id number] Date of Birth: 06-27-1960    CLINICAL DATA:  Dysphagia especially for breads and meats, recent C2 fracture, metastatic breast cancer EXAM: MODIFIED BARIUM SWALLOW TECHNIQUE: Different consistencies of barium were administered orally to  the patient by the Speech Pathologist. Imaging of the pharynx was performed in the lateral projection. The radiologist was present in the fluoroscopy room for this study, providing personal supervision. FLUOROSCOPY TIME:  Fluoroscopy Time:  2 minutes 24 seconds Radiation Exposure Index (if provided by the fluoroscopic device): 13.7 mGy Number of Acquired Spot Images: multiple fluoroscopic screen captures COMPARISON:  None FINDINGS: Thin barium, applesauce consistency, cracker consistency and a 12.5 mm diameter barium tablet were utilized. No laryngeal penetration or aspiration of any evaluated consistencies. Minimal piriform sinus and vallecular residuals with thin barium, cleared by a second swallow. Remainder of swallowing exam unremarkable. Barium tablet passed to stomach without obstruction. Minimal prevertebral soft tissue prominence at C2 consistent with history of recent C2 fracture. IMPRESSION: Minimal piriform sinus and vallecular residuals with thin barium. Remainder of exam normal. Please refer to the Speech Pathologists report for complete details and recommendations. Electronically Signed   By: Lavonia Dana M.D.   On: 01/06/2020 12:27   IR IMAGING GUIDED PORT INSERTION  Result Date: 01/09/2020 CLINICAL DATA:  Metastatic left breast carcinoma. Needs durable venous access for planned treatment regimen. EXAM: TUNNELED PORT CATHETER PLACEMENT WITH ULTRASOUND AND FLUOROSCOPIC GUIDANCE FLUOROSCOPY TIME:  6 seconds; 1 mGy ANESTHESIA/SEDATION: Intravenous Fentanyl 67mg and Versed 112mwere administered as conscious sedation during continuous monitoring of the patient's level of consciousness and physiological / cardiorespiratory status by the radiology RN, with a total moderate sedation time of 17 minutes. TECHNIQUE: The procedure, risks, benefits, and alternatives were explained to the patient. Questions regarding the procedure were encouraged and answered. The patient understands and consents to the  procedure. As antibiotic prophylaxis, cefazolin 2 g was ordered pre-procedure and administered intravenously within one hour of incision. Patency of the right IJ vein was confirmed with ultrasound with image documentation. An appropriate skin site was determined. Skin site was marked. Region was prepped using maximum barrier technique including cap and mask, sterile gown, sterile gloves, large sterile sheet, and Chlorhexidine as cutaneous antisepsis. The region was infiltrated locally with 1% lidocaine. Under real-time ultrasound guidance, the right IJ vein was accessed with a 21 gauge micropuncture needle; the needle tip within the vein was confirmed with ultrasound image documentation. Needle was exchanged over a 018 guidewire for transitional dilator, and vascular measurement was performed. A small incision was made on the right anterior chest wall at the site of previous scar, and a subcutaneous pocket fashioned above the incision. The power-injectable port was positioned and its catheter tunneled to the right IJ dermatotomy site. The transitional dilator was exchanged over an Amplatz wire for a peel-away sheath, through which the port catheter, which had been trimmed to the appropriate length, was advanced and positioned under fluoroscopy with its  tip at the cavoatrial junction. Spot chest radiograph confirms good catheter position and no pneumothorax. The port was flushed per protocol. The pocket was closed with deep interrupted and subcuticular continuous 3-0 Monocryl sutures. The incisions were covered with Dermabond then covered with a sterile dressing. The patient tolerated the procedure well. COMPLICATIONS: COMPLICATIONS None immediate IMPRESSION: Technically successful right IJ power-injectable port catheter placement. Ready for routine use. Electronically Signed   By: Lucrezia Europe M.D.   On: 01/09/2020 17:08     ASSESSMENT:   1.  Metastatic HER-2 positive breast cancer to the liver and  bones:  -Presentation with left upper quadrant pain for 3 weeks and 25-30 pound weight loss in the last couple months due to decreased appetite. -CTAP with contrast on 11/02/2019 done in the ER showed extensive hypodense lesions throughout the liver parenchyma concerning for metastatic disease. Lung nodules at the left lung base. Extensive bone metastasis. Anterior wedge compression deformity of T12 vertebral body. -Liver biopsy on 11/19/2019 shows invasive ductal carcinoma, ER/PR positive, HER-2 positive by FISH, Ki-67-20%. -MRI of the thoracic spine on 11/18/2019 shows diffuse osseous metastatic disease with no significant extradural extension.  MRI of the lumbar spine on 10/24/2019 shows multifocal osseous signal abnormality with moderate disc degeneration at L3-L4 and L4-L5.  No epidural tumor. -CT chest with contrast showed widespread pleuroparenchymal, left hilar, osseous metastatic disease.  2. History of left breast cancer: -Diagnosed in 2008, ER/PR/HER-2 positive. Treated in [place], [place] [place]. -Completed 1 year of Herceptin. -Took 5 years of tamoxifen and 5 years of Femara. -Genetic testing was reportedly negative.  3. Osteopenia: -DEXA scan on 03/27/2019 with T score -1.0. -Started on Prolia from 03/13/2015, last injection on 09/26/2019.  PLAN:   1.  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 ---Dr Delton Coombes talked about the side effects of the chemotherapy regimen in detail. 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 --Ok to proceed with chemotherapy as planned Discussed about adverse effects once again including but not limited to fatigue, nausea, vomiting, diarrhea, increased risk of infection, cytopenias, hairloss, worsening neuropathy   2. Weight loss: - stable.  3. Back pain: -Continue fentanyl 50 mcg  patch. -Continue oxycodone 5 mg every 4 hours as needed.  4.  Malignant hypercalcemia: Resolved.  5.  Pathological fracture of C2 body: -MRI of the cervical spine on 12/02/2019 shows suspected nondisplaced pathological fracture involving C2 vertebral body and base of dens.  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  No masslike cervical dural thickening/epidural tumor. -She just had the cervical collar removed, pain is stable.  6. Severe anemia, Will recommend transfusion of 1 unit PRBC Will add iron panel, ferritin, B12 and folate to her labs    Orders placed this encounter:  Orders Placed This Encounter  Procedures  . US Venous Img Lower Unilateral Left  . Ferritin  . Iron and TIBC  . Vitamin B12  . Folate   Benay Pike MD

## 2020-01-13 ENCOUNTER — Inpatient Hospital Stay (HOSPITAL_COMMUNITY): Payer: BC Managed Care – PPO

## 2020-01-13 ENCOUNTER — Inpatient Hospital Stay (HOSPITAL_BASED_OUTPATIENT_CLINIC_OR_DEPARTMENT_OTHER): Payer: BC Managed Care – PPO | Admitting: Hematology and Oncology

## 2020-01-13 ENCOUNTER — Other Ambulatory Visit: Payer: Self-pay

## 2020-01-13 ENCOUNTER — Encounter (HOSPITAL_COMMUNITY): Payer: Self-pay | Admitting: Hematology and Oncology

## 2020-01-13 ENCOUNTER — Ambulatory Visit (HOSPITAL_COMMUNITY): Admission: RE | Admit: 2020-01-13 | Payer: BC Managed Care – PPO | Source: Ambulatory Visit

## 2020-01-13 VITALS — BP 137/81 | HR 121 | Temp 97.1°F | Resp 18

## 2020-01-13 VITALS — BP 142/76 | HR 133 | Temp 97.4°F | Resp 17 | Ht 64.0 in | Wt 107.0 lb

## 2020-01-13 DIAGNOSIS — Z5112 Encounter for antineoplastic immunotherapy: Secondary | ICD-10-CM | POA: Diagnosis not present

## 2020-01-13 DIAGNOSIS — C7802 Secondary malignant neoplasm of left lung: Secondary | ICD-10-CM | POA: Diagnosis not present

## 2020-01-13 DIAGNOSIS — R200 Anesthesia of skin: Secondary | ICD-10-CM | POA: Diagnosis not present

## 2020-01-13 DIAGNOSIS — C787 Secondary malignant neoplasm of liver and intrahepatic bile duct: Secondary | ICD-10-CM | POA: Diagnosis not present

## 2020-01-13 DIAGNOSIS — C50412 Malignant neoplasm of upper-outer quadrant of left female breast: Secondary | ICD-10-CM | POA: Diagnosis not present

## 2020-01-13 DIAGNOSIS — Z5111 Encounter for antineoplastic chemotherapy: Secondary | ICD-10-CM | POA: Diagnosis not present

## 2020-01-13 DIAGNOSIS — Z95828 Presence of other vascular implants and grafts: Secondary | ICD-10-CM

## 2020-01-13 DIAGNOSIS — C50919 Malignant neoplasm of unspecified site of unspecified female breast: Secondary | ICD-10-CM

## 2020-01-13 DIAGNOSIS — C7951 Secondary malignant neoplasm of bone: Secondary | ICD-10-CM | POA: Diagnosis not present

## 2020-01-13 DIAGNOSIS — Z5189 Encounter for other specified aftercare: Secondary | ICD-10-CM | POA: Diagnosis not present

## 2020-01-13 DIAGNOSIS — M549 Dorsalgia, unspecified: Secondary | ICD-10-CM | POA: Diagnosis not present

## 2020-01-13 DIAGNOSIS — G479 Sleep disorder, unspecified: Secondary | ICD-10-CM | POA: Diagnosis not present

## 2020-01-13 DIAGNOSIS — E7800 Pure hypercholesterolemia, unspecified: Secondary | ICD-10-CM | POA: Diagnosis not present

## 2020-01-13 DIAGNOSIS — R634 Abnormal weight loss: Secondary | ICD-10-CM | POA: Diagnosis not present

## 2020-01-13 DIAGNOSIS — M7989 Other specified soft tissue disorders: Secondary | ICD-10-CM

## 2020-01-13 DIAGNOSIS — Z9221 Personal history of antineoplastic chemotherapy: Secondary | ICD-10-CM | POA: Diagnosis not present

## 2020-01-13 DIAGNOSIS — R5383 Other fatigue: Secondary | ICD-10-CM | POA: Diagnosis not present

## 2020-01-13 DIAGNOSIS — G893 Neoplasm related pain (acute) (chronic): Secondary | ICD-10-CM | POA: Diagnosis not present

## 2020-01-13 DIAGNOSIS — Z170 Estrogen receptor positive status [ER+]: Secondary | ICD-10-CM | POA: Diagnosis not present

## 2020-01-13 DIAGNOSIS — M8580 Other specified disorders of bone density and structure, unspecified site: Secondary | ICD-10-CM | POA: Diagnosis not present

## 2020-01-13 DIAGNOSIS — M8448XA Pathological fracture, other site, initial encounter for fracture: Secondary | ICD-10-CM | POA: Diagnosis not present

## 2020-01-13 DIAGNOSIS — E119 Type 2 diabetes mellitus without complications: Secondary | ICD-10-CM | POA: Diagnosis not present

## 2020-01-13 DIAGNOSIS — R2242 Localized swelling, mass and lump, left lower limb: Secondary | ICD-10-CM | POA: Diagnosis not present

## 2020-01-13 DIAGNOSIS — I10 Essential (primary) hypertension: Secondary | ICD-10-CM | POA: Diagnosis not present

## 2020-01-13 DIAGNOSIS — C7801 Secondary malignant neoplasm of right lung: Secondary | ICD-10-CM | POA: Diagnosis not present

## 2020-01-13 DIAGNOSIS — Z79899 Other long term (current) drug therapy: Secondary | ICD-10-CM | POA: Diagnosis not present

## 2020-01-13 DIAGNOSIS — D649 Anemia, unspecified: Secondary | ICD-10-CM

## 2020-01-13 DIAGNOSIS — K5900 Constipation, unspecified: Secondary | ICD-10-CM | POA: Diagnosis not present

## 2020-01-13 LAB — CBC WITH DIFFERENTIAL/PLATELET
Abs Immature Granulocytes: 0.11 10*3/uL — ABNORMAL HIGH (ref 0.00–0.07)
Basophils Absolute: 0 10*3/uL (ref 0.0–0.1)
Basophils Relative: 0 %
Eosinophils Absolute: 0.1 10*3/uL (ref 0.0–0.5)
Eosinophils Relative: 1 %
HCT: 26.3 % — ABNORMAL LOW (ref 36.0–46.0)
Hemoglobin: 7.9 g/dL — ABNORMAL LOW (ref 12.0–15.0)
Immature Granulocytes: 1 %
Lymphocytes Relative: 30 %
Lymphs Abs: 3.1 10*3/uL (ref 0.7–4.0)
MCH: 25.8 pg — ABNORMAL LOW (ref 26.0–34.0)
MCHC: 30 g/dL (ref 30.0–36.0)
MCV: 85.9 fL (ref 80.0–100.0)
Monocytes Absolute: 1.7 10*3/uL — ABNORMAL HIGH (ref 0.1–1.0)
Monocytes Relative: 16 %
Neutro Abs: 5.5 10*3/uL (ref 1.7–7.7)
Neutrophils Relative %: 52 %
Platelets: 315 10*3/uL (ref 150–400)
RBC: 3.06 MIL/uL — ABNORMAL LOW (ref 3.87–5.11)
RDW: 21.2 % — ABNORMAL HIGH (ref 11.5–15.5)
WBC: 10.6 10*3/uL — ABNORMAL HIGH (ref 4.0–10.5)
nRBC: 4.4 % — ABNORMAL HIGH (ref 0.0–0.2)

## 2020-01-13 LAB — ABO/RH: ABO/RH(D): O POS

## 2020-01-13 LAB — COMPREHENSIVE METABOLIC PANEL
ALT: 20 U/L (ref 0–44)
AST: 99 U/L — ABNORMAL HIGH (ref 15–41)
Albumin: 2.4 g/dL — ABNORMAL LOW (ref 3.5–5.0)
Alkaline Phosphatase: 340 U/L — ABNORMAL HIGH (ref 38–126)
Anion gap: 11 (ref 5–15)
BUN: 14 mg/dL (ref 8–23)
CO2: 27 mmol/L (ref 22–32)
Calcium: 9.7 mg/dL (ref 8.9–10.3)
Chloride: 95 mmol/L — ABNORMAL LOW (ref 98–111)
Creatinine, Ser: 0.73 mg/dL (ref 0.44–1.00)
GFR, Estimated: >60 mL/min (ref >60)
Glucose, Bld: 125 mg/dL — ABNORMAL HIGH (ref 70–99)
Potassium: 3.9 mmol/L (ref 3.5–5.1)
Sodium: 133 mmol/L — ABNORMAL LOW (ref 135–145)
Total Bilirubin: 1 mg/dL (ref 0.3–1.2)
Total Protein: 8.6 g/dL — ABNORMAL HIGH (ref 6.5–8.1)

## 2020-01-13 LAB — PREPARE RBC (CROSSMATCH)

## 2020-01-13 MED ORDER — DIPHENHYDRAMINE HCL 25 MG PO CAPS
50.0000 mg | ORAL_CAPSULE | Freq: Once | ORAL | Status: AC
  Administered 2020-01-13: 11:00:00 50 mg via ORAL
  Filled 2020-01-13: qty 2

## 2020-01-13 MED ORDER — SODIUM CHLORIDE 0.9 % IV SOLN
10.0000 mg | Freq: Once | INTRAVENOUS | Status: AC
  Administered 2020-01-13: 11:00:00 10 mg via INTRAVENOUS
  Filled 2020-01-13: qty 10

## 2020-01-13 MED ORDER — HEPARIN SOD (PORK) LOCK FLUSH 100 UNIT/ML IV SOLN
500.0000 [IU] | Freq: Once | INTRAVENOUS | Status: AC | PRN
  Administered 2020-01-13: 17:00:00 500 [IU]

## 2020-01-13 MED ORDER — SODIUM CHLORIDE 0.9 % IV SOLN: Freq: Once | INTRAVENOUS | Status: AC

## 2020-01-13 MED ORDER — TRASTUZUMAB-DKST CHEMO 150 MG IV SOLR
8.0000 mg/kg | Freq: Once | INTRAVENOUS | Status: AC
  Administered 2020-01-13: 12:00:00 399 mg via INTRAVENOUS
  Filled 2020-01-13: qty 19

## 2020-01-13 MED ORDER — ACETAMINOPHEN 325 MG PO TABS
650.0000 mg | ORAL_TABLET | Freq: Once | ORAL | Status: AC
  Administered 2020-01-13: 11:00:00 650 mg via ORAL
  Filled 2020-01-13: qty 2

## 2020-01-13 MED ORDER — ONDANSETRON HCL 4 MG/2ML IJ SOLN
8.0000 mg | Freq: Once | INTRAMUSCULAR | Status: AC
  Administered 2020-01-13: 11:00:00 8 mg via INTRAVENOUS
  Filled 2020-01-13: qty 4

## 2020-01-13 MED ORDER — SODIUM CHLORIDE 0.9 % IV SOLN
840.0000 mg | Freq: Once | INTRAVENOUS | Status: AC
  Administered 2020-01-13: 14:00:00 840 mg via INTRAVENOUS
  Filled 2020-01-13: qty 28

## 2020-01-13 MED ORDER — SODIUM CHLORIDE 0.9% FLUSH
10.0000 mL | INTRAVENOUS | Status: DC | PRN
  Administered 2020-01-13: 17:00:00 10 mL

## 2020-01-13 MED ORDER — SODIUM CHLORIDE 0.9 % IV SOLN
45.0000 mg/m2 | Freq: Once | INTRAVENOUS | Status: AC
  Administered 2020-01-13: 16:00:00 70 mg via INTRAVENOUS
  Filled 2020-01-13: qty 7

## 2020-01-13 NOTE — Patient Instructions (Signed)
[facility] Discharge Instructions for Patients Receiving Chemotherapy  Today you received the following chemotherapy agents   To help prevent nausea and vomiting after your treatment, we encourage you to take your nausea medication   If you develop nausea and vomiting that is not controlled by your nausea medication, call the clinic.   BELOW ARE SYMPTOMS THAT SHOULD BE REPORTED IMMEDIATELY:  *FEVER GREATER THAN 100.5 F  *CHILLS WITH OR WITHOUT FEVER  NAUSEA AND VOMITING THAT IS NOT CONTROLLED WITH YOUR NAUSEA MEDICATION  *UNUSUAL SHORTNESS OF BREATH  *UNUSUAL BRUISING OR BLEEDING  TENDERNESS IN MOUTH AND THROAT WITH OR WITHOUT PRESENCE OF ULCERS  *URINARY PROBLEMS  *BOWEL PROBLEMS  UNUSUAL RASH Items with * indicate a potential emergency and should be followed up as soon as possible.  Feel free to call the clinic should you have any questions or concerns. The clinic phone number is [phone].  Please show the CHEMO ALERT CARD at check-in to the Emergency Department and triage nurse.   

## 2020-01-13 NOTE — Progress Notes (Signed)
Labs and vital signs reviewed by Dr. Chryl Heck.  AST 99, Hgb 7.9, HCT 26.3, and Albumin 2.4.  Message received from [facility] that patient is okay for treatment.  Verbal order from [facility]/Dr Iruku  to give patient one UPRBC, Hbg  7.9.  Blood transufsion scheduled for 01/14/20.  After treatment today patient has been scheduled for ultra sound of the left leg due to swelling to rule out blood clots.  Treatment given today per MD orders.  Tolerated infusion without adverse affects.  Vital signs stable.  No complaints at this time.  Discharge from clinic ambulatory in stable condition.  Alert and oriented X 3.  Follow up with [facility] as scheduled.

## 2020-01-13 NOTE — Progress Notes (Signed)
Patient presents today for treatment and follow up visit with Dr. Chryl Heck. Heart rate on arrival 145. Patient's trend for heart rate is 145/150. Patient rates her pain today a 6/10 at her neck. Port a cath placed on 01/09/20.

## 2020-01-14 ENCOUNTER — Encounter (HOSPITAL_COMMUNITY): Payer: Self-pay

## 2020-01-14 ENCOUNTER — Other Ambulatory Visit (HOSPITAL_COMMUNITY): Payer: Self-pay | Admitting: *Deleted

## 2020-01-14 ENCOUNTER — Inpatient Hospital Stay (HOSPITAL_COMMUNITY): Payer: BC Managed Care – PPO

## 2020-01-14 ENCOUNTER — Other Ambulatory Visit (HOSPITAL_COMMUNITY): Payer: Self-pay | Admitting: Hematology and Oncology

## 2020-01-14 DIAGNOSIS — D649 Anemia, unspecified: Secondary | ICD-10-CM

## 2020-01-14 DIAGNOSIS — Z5112 Encounter for antineoplastic immunotherapy: Secondary | ICD-10-CM | POA: Diagnosis not present

## 2020-01-14 DIAGNOSIS — G479 Sleep disorder, unspecified: Secondary | ICD-10-CM | POA: Diagnosis not present

## 2020-01-14 DIAGNOSIS — R200 Anesthesia of skin: Secondary | ICD-10-CM | POA: Diagnosis not present

## 2020-01-14 DIAGNOSIS — C787 Secondary malignant neoplasm of liver and intrahepatic bile duct: Secondary | ICD-10-CM | POA: Diagnosis not present

## 2020-01-14 DIAGNOSIS — R2242 Localized swelling, mass and lump, left lower limb: Secondary | ICD-10-CM | POA: Diagnosis not present

## 2020-01-14 DIAGNOSIS — C50412 Malignant neoplasm of upper-outer quadrant of left female breast: Secondary | ICD-10-CM | POA: Diagnosis not present

## 2020-01-14 DIAGNOSIS — K5900 Constipation, unspecified: Secondary | ICD-10-CM | POA: Diagnosis not present

## 2020-01-14 DIAGNOSIS — R5383 Other fatigue: Secondary | ICD-10-CM | POA: Diagnosis not present

## 2020-01-14 DIAGNOSIS — C7951 Secondary malignant neoplasm of bone: Secondary | ICD-10-CM | POA: Diagnosis not present

## 2020-01-14 DIAGNOSIS — C7801 Secondary malignant neoplasm of right lung: Secondary | ICD-10-CM | POA: Diagnosis not present

## 2020-01-14 DIAGNOSIS — Z170 Estrogen receptor positive status [ER+]: Secondary | ICD-10-CM | POA: Diagnosis not present

## 2020-01-14 DIAGNOSIS — C50919 Malignant neoplasm of unspecified site of unspecified female breast: Secondary | ICD-10-CM

## 2020-01-14 DIAGNOSIS — Z5189 Encounter for other specified aftercare: Secondary | ICD-10-CM | POA: Diagnosis not present

## 2020-01-14 DIAGNOSIS — I10 Essential (primary) hypertension: Secondary | ICD-10-CM | POA: Diagnosis not present

## 2020-01-14 DIAGNOSIS — M549 Dorsalgia, unspecified: Secondary | ICD-10-CM | POA: Diagnosis not present

## 2020-01-14 DIAGNOSIS — C7802 Secondary malignant neoplasm of left lung: Secondary | ICD-10-CM | POA: Diagnosis not present

## 2020-01-14 DIAGNOSIS — Z5111 Encounter for antineoplastic chemotherapy: Secondary | ICD-10-CM | POA: Diagnosis not present

## 2020-01-14 LAB — IRON AND TIBC
Iron: 171 ug/dL — ABNORMAL HIGH (ref 28–170)
Saturation Ratios: 61 % — ABNORMAL HIGH (ref 10.4–31.8)
TIBC: 280 ug/dL (ref 250–450)
UIBC: 109 ug/dL

## 2020-01-14 LAB — FERRITIN: Ferritin: 1979 ng/mL — ABNORMAL HIGH (ref 11–307)

## 2020-01-14 LAB — FOLATE: Folate: 9.5 ng/mL (ref >5.9)

## 2020-01-14 LAB — VITAMIN B12: Vitamin B-12: >7500 pg/mL — ABNORMAL HIGH (ref 180–914)

## 2020-01-14 MED ORDER — HEPARIN SOD (PORK) LOCK FLUSH 100 UNIT/ML IV SOLN
500.0000 [IU] | Freq: Every day | INTRAVENOUS | Status: AC | PRN
  Administered 2020-01-14: 14:00:00 500 [IU]

## 2020-01-14 MED ORDER — ACETAMINOPHEN 325 MG PO TABS
650.0000 mg | ORAL_TABLET | Freq: Once | ORAL | Status: AC
  Administered 2020-01-14: 11:00:00 650 mg via ORAL
  Filled 2020-01-14: qty 2

## 2020-01-14 MED ORDER — DIPHENHYDRAMINE HCL 25 MG PO CAPS
25.0000 mg | ORAL_CAPSULE | Freq: Once | ORAL | Status: AC
  Administered 2020-01-14: 11:00:00 25 mg via ORAL
  Filled 2020-01-14: qty 1

## 2020-01-14 MED ORDER — SODIUM CHLORIDE 0.9% FLUSH
10.0000 mL | INTRAVENOUS | Status: AC | PRN
  Administered 2020-01-14: 11:00:00 10 mL

## 2020-01-14 MED ORDER — SODIUM CHLORIDE 0.9% IV SOLUTION
250.0000 mL | Freq: Once | INTRAVENOUS | Status: AC
  Administered 2020-01-14: 11:00:00 250 mL via INTRAVENOUS

## 2020-01-14 NOTE — Progress Notes (Signed)
24 hour call back performed while patient on the unit receiving 1 Unit of Packed Red Blood Cells. Patient denies any nausea or vomiting. Patient states she did experience diarrhea throughout the night. Patient took Lactulose this morning . Patient teaching performed pertaining to symptom management with chemotherapy and continuing Lactulose. Patient states she was told to continue Lactulose for elevated liver enzymes and confusion.  Patient instructed to hold Lactulose while having increased diarrhea from the side effects of chemotherapy. Message sent to [place] RN to clarify plan moving forward.

## 2020-01-14 NOTE — Progress Notes (Signed)
Blood consent obtained

## 2020-01-14 NOTE — Progress Notes (Signed)
To treatment room for blood transfusion.  Patient stated she did well after chemotherapy but did have diarrhea during the night.  Patient is taking Lactulose for "confusion or disorientation" per patient and family.  Patient's family stated they are using the lactulose as directed by rehab for disorientation and they adjust the dose depending on her mental status.  Instructed the patient and family on side effects of Lactulose and chemotherapy with understanding verbalized. No s/s of distress noted.    Patient returning next week for lab check for possible hydration or blood transfusion.    Patient tolerated blood transfusion with no complaints voiced.  Side effects of blood transfusions reviewed with understanding verbalized.  Port site clean and dry with no bruising or swelling noted at site.  Good blood return noted before and after administration of blood.  Band aid applied.  Patient left in satisfactory condition with VSS and no s/s of distress noted.

## 2020-01-15 ENCOUNTER — Other Ambulatory Visit: Payer: Self-pay

## 2020-01-15 ENCOUNTER — Inpatient Hospital Stay (HOSPITAL_COMMUNITY): Payer: BC Managed Care – PPO

## 2020-01-15 VITALS — BP 129/78 | HR 99 | Temp 97.3°F | Resp 18

## 2020-01-15 DIAGNOSIS — C7951 Secondary malignant neoplasm of bone: Secondary | ICD-10-CM | POA: Diagnosis not present

## 2020-01-15 DIAGNOSIS — Z95828 Presence of other vascular implants and grafts: Secondary | ICD-10-CM

## 2020-01-15 DIAGNOSIS — Z5189 Encounter for other specified aftercare: Secondary | ICD-10-CM | POA: Diagnosis not present

## 2020-01-15 DIAGNOSIS — Z5112 Encounter for antineoplastic immunotherapy: Secondary | ICD-10-CM | POA: Diagnosis not present

## 2020-01-15 DIAGNOSIS — C7802 Secondary malignant neoplasm of left lung: Secondary | ICD-10-CM | POA: Diagnosis not present

## 2020-01-15 DIAGNOSIS — R2242 Localized swelling, mass and lump, left lower limb: Secondary | ICD-10-CM | POA: Diagnosis not present

## 2020-01-15 DIAGNOSIS — Z5111 Encounter for antineoplastic chemotherapy: Secondary | ICD-10-CM | POA: Diagnosis not present

## 2020-01-15 DIAGNOSIS — C787 Secondary malignant neoplasm of liver and intrahepatic bile duct: Secondary | ICD-10-CM | POA: Diagnosis not present

## 2020-01-15 DIAGNOSIS — K5900 Constipation, unspecified: Secondary | ICD-10-CM | POA: Diagnosis not present

## 2020-01-15 DIAGNOSIS — C7801 Secondary malignant neoplasm of right lung: Secondary | ICD-10-CM | POA: Diagnosis not present

## 2020-01-15 DIAGNOSIS — G479 Sleep disorder, unspecified: Secondary | ICD-10-CM | POA: Diagnosis not present

## 2020-01-15 DIAGNOSIS — R200 Anesthesia of skin: Secondary | ICD-10-CM | POA: Diagnosis not present

## 2020-01-15 DIAGNOSIS — R5383 Other fatigue: Secondary | ICD-10-CM | POA: Diagnosis not present

## 2020-01-15 DIAGNOSIS — C50919 Malignant neoplasm of unspecified site of unspecified female breast: Secondary | ICD-10-CM

## 2020-01-15 DIAGNOSIS — Z170 Estrogen receptor positive status [ER+]: Secondary | ICD-10-CM | POA: Diagnosis not present

## 2020-01-15 DIAGNOSIS — M549 Dorsalgia, unspecified: Secondary | ICD-10-CM | POA: Diagnosis not present

## 2020-01-15 DIAGNOSIS — C50412 Malignant neoplasm of upper-outer quadrant of left female breast: Secondary | ICD-10-CM | POA: Diagnosis not present

## 2020-01-15 DIAGNOSIS — I10 Essential (primary) hypertension: Secondary | ICD-10-CM | POA: Diagnosis not present

## 2020-01-15 MED ORDER — PEGFILGRASTIM-JMDB 6 MG/0.6ML ~~LOC~~ SOSY
6.0000 mg | PREFILLED_SYRINGE | Freq: Once | SUBCUTANEOUS | Status: AC
  Administered 2020-01-15: 14:00:00 6 mg via SUBCUTANEOUS
  Filled 2020-01-15: qty 0.6

## 2020-01-15 NOTE — Progress Notes (Signed)
Patient tolerated Fulphila injection with no complaints voiced.  Site clean and dry with no bruising or swelling noted.  No complaints of pain.  Discharged with vital signs stable and no signs or symptoms of distress noted.  

## 2020-01-17 ENCOUNTER — Other Ambulatory Visit (HOSPITAL_COMMUNITY): Payer: Self-pay | Admitting: *Deleted

## 2020-01-17 ENCOUNTER — Ambulatory Visit (HOSPITAL_COMMUNITY): Payer: BC Managed Care – PPO

## 2020-01-17 DIAGNOSIS — C50919 Malignant neoplasm of unspecified site of unspecified female breast: Secondary | ICD-10-CM

## 2020-01-17 LAB — TYPE AND SCREEN
ABO/RH(D): O POS
Antibody Screen: POSITIVE
DAT, IgG: NEGATIVE
Unit division: 0
Unit division: 0

## 2020-01-17 LAB — BPAM RBC
Blood Product Expiration Date: 202201142359
Blood Product Expiration Date: 202201142359
ISSUE DATE / TIME: 202112141214
ISSUE DATE / TIME: 202112141214
Unit Type and Rh: 5100
Unit Type and Rh: 5100

## 2020-01-17 NOTE — Progress Notes (Signed)
Nutrition Follow-up:   Patient with metastatic breast cancer.  Patient receiving docetaxel, trastuzumab, pertuzumab  Spoke with patient via phone.  Patient just waking up.  Reports appetite maybe a little bit better.  Having issues with fatigue and some nausea after first chemotherapy treatment.  Reports that she is drinking ensure shakes and would like to pick up more from cancer center.      Medications: zofran and compazine   Labs: reviewed  Anthropometrics:   Weight 107 lb on 12/13  114 lb 11/7 hospital weight  109 lb 10/19   NUTRITION DIAGNOSIS: Inadequate oral intake continues   INTERVENTION:  Encouraged patient to be proactive with taking nausea medications Complimentary case of ensure plus left at front desk for patient pick up.     MONITORING, EVALUATION, GOAL: weight trends, intake   NEXT VISIT: Jan 14, phone f/u  Krista Doyle, [place], [place] Registered Dietitian [phone] (mobile)

## 2020-01-19 DIAGNOSIS — E440 Moderate protein-calorie malnutrition: Secondary | ICD-10-CM | POA: Diagnosis not present

## 2020-01-19 DIAGNOSIS — C7800 Secondary malignant neoplasm of unspecified lung: Secondary | ICD-10-CM | POA: Diagnosis not present

## 2020-01-19 DIAGNOSIS — S12101S Unspecified nondisplaced fracture of second cervical vertebra, sequela: Secondary | ICD-10-CM | POA: Diagnosis not present

## 2020-01-19 DIAGNOSIS — C50919 Malignant neoplasm of unspecified site of unspecified female breast: Secondary | ICD-10-CM | POA: Diagnosis not present

## 2020-01-20 ENCOUNTER — Other Ambulatory Visit (HOSPITAL_COMMUNITY): Payer: Self-pay | Admitting: Surgery

## 2020-01-20 ENCOUNTER — Telehealth (HOSPITAL_COMMUNITY): Payer: Self-pay | Admitting: Surgery

## 2020-01-20 DIAGNOSIS — C50919 Malignant neoplasm of unspecified site of unspecified female breast: Secondary | ICD-10-CM

## 2020-01-20 DIAGNOSIS — D649 Anemia, unspecified: Secondary | ICD-10-CM

## 2020-01-20 MED ORDER — MAGIC MOUTHWASH: ORAL | 1 refills | Status: AC

## 2020-01-20 NOTE — Telephone Encounter (Signed)
Pt's family member called to see if the pt could get medication for thrush and mouth pain.    I spoke to [employer], PA, and magic mouthwash with lidocaine added was sent to [facility].  I called the family member back and went over the instructions for the magic mouthwash, and they verbalized understanding.  I told them to call our office back if they had any more questions or concerns.

## 2020-01-20 NOTE — Progress Notes (Signed)
Magic

## 2020-01-21 ENCOUNTER — Other Ambulatory Visit: Payer: Self-pay

## 2020-01-21 ENCOUNTER — Encounter (HOSPITAL_COMMUNITY): Payer: Self-pay | Admitting: Hematology and Oncology

## 2020-01-21 ENCOUNTER — Other Ambulatory Visit (HOSPITAL_COMMUNITY): Payer: Self-pay | Admitting: Hematology and Oncology

## 2020-01-21 ENCOUNTER — Inpatient Hospital Stay (HOSPITAL_BASED_OUTPATIENT_CLINIC_OR_DEPARTMENT_OTHER): Payer: BC Managed Care – PPO | Admitting: Hematology and Oncology

## 2020-01-21 ENCOUNTER — Inpatient Hospital Stay (HOSPITAL_COMMUNITY): Payer: BC Managed Care – PPO

## 2020-01-21 VITALS — BP 148/88 | HR 100 | Temp 97.2°F | Resp 18

## 2020-01-21 DIAGNOSIS — C50919 Malignant neoplasm of unspecified site of unspecified female breast: Secondary | ICD-10-CM

## 2020-01-21 DIAGNOSIS — Z5111 Encounter for antineoplastic chemotherapy: Secondary | ICD-10-CM | POA: Diagnosis not present

## 2020-01-21 DIAGNOSIS — C50412 Malignant neoplasm of upper-outer quadrant of left female breast: Secondary | ICD-10-CM | POA: Diagnosis not present

## 2020-01-21 DIAGNOSIS — Z170 Estrogen receptor positive status [ER+]: Secondary | ICD-10-CM | POA: Diagnosis not present

## 2020-01-21 DIAGNOSIS — Z5112 Encounter for antineoplastic immunotherapy: Secondary | ICD-10-CM | POA: Diagnosis not present

## 2020-01-21 DIAGNOSIS — R2242 Localized swelling, mass and lump, left lower limb: Secondary | ICD-10-CM | POA: Diagnosis not present

## 2020-01-21 DIAGNOSIS — Z5189 Encounter for other specified aftercare: Secondary | ICD-10-CM | POA: Diagnosis not present

## 2020-01-21 DIAGNOSIS — I10 Essential (primary) hypertension: Secondary | ICD-10-CM | POA: Diagnosis not present

## 2020-01-21 DIAGNOSIS — C787 Secondary malignant neoplasm of liver and intrahepatic bile duct: Secondary | ICD-10-CM | POA: Diagnosis not present

## 2020-01-21 DIAGNOSIS — M549 Dorsalgia, unspecified: Secondary | ICD-10-CM | POA: Diagnosis not present

## 2020-01-21 DIAGNOSIS — R5383 Other fatigue: Secondary | ICD-10-CM | POA: Diagnosis not present

## 2020-01-21 DIAGNOSIS — K5900 Constipation, unspecified: Secondary | ICD-10-CM | POA: Diagnosis not present

## 2020-01-21 DIAGNOSIS — G479 Sleep disorder, unspecified: Secondary | ICD-10-CM | POA: Diagnosis not present

## 2020-01-21 DIAGNOSIS — C7951 Secondary malignant neoplasm of bone: Secondary | ICD-10-CM | POA: Diagnosis not present

## 2020-01-21 DIAGNOSIS — C7802 Secondary malignant neoplasm of left lung: Secondary | ICD-10-CM | POA: Diagnosis not present

## 2020-01-21 DIAGNOSIS — R200 Anesthesia of skin: Secondary | ICD-10-CM | POA: Diagnosis not present

## 2020-01-21 DIAGNOSIS — Z95828 Presence of other vascular implants and grafts: Secondary | ICD-10-CM

## 2020-01-21 DIAGNOSIS — C7801 Secondary malignant neoplasm of right lung: Secondary | ICD-10-CM | POA: Diagnosis not present

## 2020-01-21 LAB — CBC WITH DIFFERENTIAL/PLATELET
Band Neutrophils: 9 %
Basophils Absolute: 0 10*3/uL (ref 0.0–0.1)
Basophils Relative: 0 %
Eosinophils Absolute: 0 10*3/uL (ref 0.0–0.5)
Eosinophils Relative: 0 %
HCT: 33 % — ABNORMAL LOW (ref 36.0–46.0)
Hemoglobin: 10.4 g/dL — ABNORMAL LOW (ref 12.0–15.0)
Lymphocytes Relative: 35 %
Lymphs Abs: 5.1 10*3/uL — ABNORMAL HIGH (ref 0.7–4.0)
MCH: 26.7 pg (ref 26.0–34.0)
MCHC: 31.5 g/dL (ref 30.0–36.0)
MCV: 84.6 fL (ref 80.0–100.0)
Metamyelocytes Relative: 9 %
Monocytes Absolute: 0.9 10*3/uL (ref 0.1–1.0)
Monocytes Relative: 6 %
Myelocytes: 5 %
Neutro Abs: 6.3 10*3/uL (ref 1.7–7.7)
Neutrophils Relative %: 34 %
Platelets: 191 10*3/uL (ref 150–400)
Promyelocytes Relative: 2 %
RBC: 3.9 MIL/uL (ref 3.87–5.11)
RDW: 20.4 % — ABNORMAL HIGH (ref 11.5–15.5)
WBC: 14.6 10*3/uL — ABNORMAL HIGH (ref 4.0–10.5)
nRBC: 6.2 % — ABNORMAL HIGH (ref 0.0–0.2)
nRBC: 8 /100 WBC — ABNORMAL HIGH

## 2020-01-21 LAB — COMPREHENSIVE METABOLIC PANEL
ALT: 37 U/L (ref 0–44)
AST: 123 U/L — ABNORMAL HIGH (ref 15–41)
Albumin: 2.4 g/dL — ABNORMAL LOW (ref 3.5–5.0)
Alkaline Phosphatase: 362 U/L — ABNORMAL HIGH (ref 38–126)
Anion gap: 13 (ref 5–15)
BUN: 19 mg/dL (ref 8–23)
CO2: 23 mmol/L (ref 22–32)
Calcium: 8 mg/dL — ABNORMAL LOW (ref 8.9–10.3)
Chloride: 96 mmol/L — ABNORMAL LOW (ref 98–111)
Creatinine, Ser: 0.68 mg/dL (ref 0.44–1.00)
GFR, Estimated: >60 mL/min (ref >60)
Glucose, Bld: 134 mg/dL — ABNORMAL HIGH (ref 70–99)
Potassium: 3.9 mmol/L (ref 3.5–5.1)
Sodium: 132 mmol/L — ABNORMAL LOW (ref 135–145)
Total Bilirubin: 1.2 mg/dL (ref 0.3–1.2)
Total Protein: 7.9 g/dL (ref 6.5–8.1)

## 2020-01-21 LAB — SAMPLE TO BLOOD BANK

## 2020-01-21 LAB — FERRITIN: Ferritin: 5377 ng/mL — ABNORMAL HIGH (ref 11–307)

## 2020-01-21 LAB — VITAMIN B12: Vitamin B-12: 7045 pg/mL — ABNORMAL HIGH (ref 180–914)

## 2020-01-21 MED ORDER — SODIUM CHLORIDE 0.9% FLUSH
10.0000 mL | INTRAVENOUS | Status: DC | PRN
  Administered 2020-01-21: 13:00:00 10 mL via INTRAVENOUS

## 2020-01-21 MED ORDER — MORPHINE SULFATE 10 MG/5ML PO SOLN: 10.0000 mg | ORAL | Status: DC | PRN

## 2020-01-21 MED ORDER — SODIUM CHLORIDE 0.9 % IV SOLN: Freq: Once | INTRAVENOUS | Status: AC

## 2020-01-21 MED ORDER — MORPHINE SULFATE 10 MG/5ML PO SOLN: 2.5000 mg | ORAL | Status: DC | PRN

## 2020-01-21 MED ORDER — MORPHINE SULFATE 20 MG/5ML PO SOLN: 5.0000 mg | ORAL | 0 refills | Status: DC | PRN

## 2020-01-21 MED ORDER — HEPARIN SOD (PORK) LOCK FLUSH 100 UNIT/ML IV SOLN
500.0000 [IU] | Freq: Once | INTRAVENOUS | Status: AC
  Administered 2020-01-21: 13:00:00 500 [IU] via INTRAVENOUS

## 2020-01-21 MED FILL — MORPHINE SULF 20 MG/5 ML SO: 20 | 7 days supply | Qty: 60 | Fill #0

## 2020-01-21 NOTE — Patient Instructions (Signed)
[place] at [facility] Discharge Instructions  Received IV hydration today. Follow-up as scheduled   Thank you for choosing [place] at [facility] to provide your oncology and hematology care.  To afford each patient quality time with our provider, please arrive at least 15 minutes before your scheduled appointment time.   If you have a lab appointment with the [facility] please come in thru the Main Entrance and check in at the main information desk.  You need to re-schedule your appointment should you arrive 10 or more minutes late.  We strive to give you quality time with our providers, and arriving late affects you and other patients whose appointments are after yours.  Also, if you no show three or more times for appointments you may be dismissed from the clinic at the providers discretion.     Again, thank you for choosing [facility].  Our hope is that these requests will decrease the amount of time that you wait before being seen by our physicians.       _____________________________________________________________  Should you have questions after your visit to [facility], please contact our office at [phone] and follow the prompts.  Our office hours are 8:00 a.m. and 4:30 p.m. Monday - Friday.  Please note that voicemails left after 4:00 p.m. may not be returned until the following business day.  We are closed weekends and major holidays.  You do have access to a nurse 24-7, just call the main number to the clinic [phone] and do not press any options, hold on the line and a nurse will answer the phone.    For prescription refill requests, have your pharmacy contact our office and allow 72 hours.    Due to Covid, you will need to wear a mask upon entering the hospital. If you do not have a mask, a mask will be given to you at the Main Entrance upon arrival. For doctor visits, patients may have 1  support person age 45 or older with them. For treatment visits, patients can not have anyone with them due to social distancing guidelines and our immunocompromised population.

## 2020-01-21 NOTE — Progress Notes (Signed)
[facility] [address], [place] [address]   CLINIC:  Medical Oncology/Hematology  PCP:  [name], MD [place] / [place] [address] [phone]   REASON FOR VISIT:   Follow-up for metastatic left breast cancer to liver  PRIOR THERAPY:  1. Completed 1 year of Herceptin. 2. Tamoxifen for 5 years and Femara for 5 years.  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--  11/28/2019 Initial Diagnosis   Metastatic breast cancer ([place])   01/13/2020 -  Chemotherapy   The patient had ondansetron (ZOFRAN) injection 8 mg, 8 mg (100 % of original dose 8 mg), Intravenous,  Once, 1 of 12 cycles Dose modification: 8 mg (original dose 8 mg, Cycle 1) Administration: 8 mg (01/13/2020) pegfilgrastim-jmdb (FULPHILA) injection 6 mg, 6 mg, Subcutaneous,  Once, 1 of 8 cycles DOCEtaxel (TAXOTERE) 70 mg in sodium chloride 0.9 % 150 mL chemo infusion, 45 mg/m2 = 70 mg (60 % of original dose 75 mg/m2), Intravenous,  Once, 1 of 8 cycles Dose modification: 45 mg/m2 (60 % of original dose 75 mg/m2, Cycle 1, Reason: Provider Judgment) Administration: 70 mg (01/13/2020) pertuzumab (PERJETA) 840 mg in sodium chloride 0.9 % 250 mL chemo infusion, 840 mg, Intravenous, Once, 1 of 12 cycles Administration: 840 mg (01/13/2020) trastuzumab-dkst (OGIVRI) 399 mg in sodium chloride 0.9 % 250 mL chemo infusion, 8 mg/kg = 399 mg, Intravenous,  Once, 1 of 12 cycles Administration: 399 mg (01/13/2020)  for chemotherapy treatment.      CANCER STAGING: Cancer Staging No matching staging information was found for the patient.  INTERVAL HISTORY:  Ms. NEIVA MAENZA, a 59 y.o. female, returns for routine follow-up after first cycle of chemotherapy She is doing poorly today, has severe sore  throat. She cant swallow much, feeling very week. She also has noticed some diarrhea, about 4/5 times a day, took one tab of imodium She has some nausea, no vomiting Feeling very weak overall Pain is stable.  REVIEW OF SYSTEMS:  Review of Systems  Constitutional: Positive for appetite change (25%) and fatigue (No energy).  HENT:   Positive for sore throat and trouble swallowing (choking).   Respiratory: Negative for chest tightness and shortness of breath.   Cardiovascular: Negative for chest pain.  Gastrointestinal: Positive for diarrhea and nausea. Negative for constipation.  Musculoskeletal: Positive for back pain (8/10 back pain) and neck pain.  Neurological: Positive for numbness.  Psychiatric/Behavioral: Positive for sleep disturbance.  All other systems reviewed and are negative.   PAST MEDICAL/SURGICAL HISTORY:  Past Medical History:  Diagnosis Date  . Asthma   . Back pain   . Breast cancer ([facility]) 2008   left  . Diabetes ([facility])   . Hypercholesterolemia   . Hypertension   . 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 Personal history of chemotherapy   . Personal history of radiation therapy 2008  . Port-A-Cath in place 12/01/2019   Past Surgical History:  Procedure Laterality Date  . BACK SURGERY    . BREAST LUMPECTOMY    . COLONOSCOPY     about 3 yrs ago in [place]  . IR IMAGING GUIDED PORT INSERTION  01/09/2020  . KNEE SURGERY     right  . TONSILLECTOMY      SOCIAL HISTORY:  Social History   Socioeconomic History  .  Marital status: Married    Spouse name: Not on file  . Number of children: Not on file  . Years of education: Not on file  . Highest education level: Not on file  Occupational History  . Occupation: Disabled  Tobacco Use  . Smoking status: Never Smoker  . Smokeless tobacco: Never Used  Substance and Sexual Activity  . Alcohol use: No    Alcohol/week: 0.0 standard drinks  . Drug use: No  . Sexual activity: Yes  Other Topics Concern   . Not on file  Social History Narrative  . Not on file   Social Determinants of Health   Financial Resource Strain: Low Risk   . Difficulty of Paying Living Expenses: Not hard at all  Food Insecurity: No Food Insecurity  . Worried About Charity fundraiser in the Last Year: Never true  . Ran Out of Food in the Last Year: Never true  Transportation Needs: No Transportation Needs  . Lack of Transportation (Medical): No  . Lack of Transportation (Non-Medical): No  Physical Activity: Inactive  . Days of Exercise per Week: 0 days  . Minutes of Exercise per Session: 0 min  Stress: No Stress Concern Present  . Feeling of Stress : Not at all  Social Connections: Moderately Integrated  . Frequency of Communication with Friends and Family: More than three times a week  . Frequency of Social Gatherings with Friends and Family: Three times a week  . Attends Religious Services: 1 to 4 times per year  . Active Member of Clubs or Organizations: No  . Attends Archivist Meetings: Never  . Marital Status: Married  Human resources officer Violence: Not At Risk  . Fear of Current or Ex-Partner: No  . Emotionally Abused: No  . Physically Abused: No  . Sexually Abused: No    FAMILY HISTORY:  Family History  Problem Relation Age of Onset  . Colon cancer Neg Hx     CURRENT MEDICATIONS:  Current Outpatient Medications  Medication Sig Dispense Refill  . albuterol (PROVENTIL HFA;VENTOLIN HFA) 108 (90 Base) MCG/ACT inhaler Inhale 1 puff into the lungs every 6 (six) hours as needed for wheezing or shortness of breath.    Marland Kitchen amitriptyline (ELAVIL) 10 MG tablet Take 1 tablet (10 mg total) by mouth at bedtime.    Marland Kitchen atorvastatin (LIPITOR) 20 MG tablet Take 20 mg by mouth at bedtime.     . BD PEN NEEDLE NANO 2ND GEN 32G X 4 MM MISC SMARTSIG:Injection Daily    . budesonide-formoterol (SYMBICORT) 160-4.5 MCG/ACT inhaler Inhale 2 puffs into the lungs every 12 (twelve) hours as needed (shortness of  breath).     . fentaNYL (DURAGESIC) 50 MCG/HR Place 1 patch onto the skin every 3 (three) days. 5 patch 0  . Lactulose 20 GM/30ML SOLN Please take 30 ml every 3 hours until you produce a bowel movement. Then take 30 ml at bedtime daily for constipation. (Patient taking differently: Take 20 g by mouth every other day.) 946 mL 2  . Lancets (ONETOUCH DELICA PLUS [id number]) MISC USE TO TEST BLOOD SUGAR 2 TIMES A DAY    . letrozole (FEMARA) 2.5 MG tablet Take 1 tablet (2.5 mg total) by mouth daily. 90 tablet 3  . lidocaine-prilocaine (EMLA) cream Apply a small amount to port a cath site and cover with plastic wrap 1 hour prior to chemotherapy appointments 30 g 3  . loratadine (CLARITIN) 10 MG tablet Take 10 mg by mouth daily as  needed for allergies.    Marland Kitchen losartan (COZAAR) 100 MG tablet Take 100 mg by mouth daily.    . magic mouthwash SOLN For mouth soreness swish and spit 4 times daily as needed.  For throat pain swish and swallow 4 times daily as needed. 360 mL 1  . magnesium oxide (MAG-OX) 400 (241.3 Mg) MG tablet Take 1 tablet (400 mg total) by mouth 2 (two) times daily.    . meclizine (ANTIVERT) 25 MG tablet Take 25 mg by mouth every 12 (twelve) hours as needed for dizziness.     . megestrol (MEGACE) 20 MG tablet Take 20 mg by mouth 2 (two) times daily.    . metoprolol tartrate (LOPRESSOR) 50 MG tablet Take 1 tablet (50 mg total) by mouth 2 (two) times daily.    . Multiple Vitamins-Minerals (PRESERVISION AREDS 2) CAPS Take 1 capsule by mouth daily.    . naloxegol oxalate (MOVANTIK) 25 MG TABS tablet Take 1 tablet (25 mg total) by mouth daily. 30 tablet 3  . ondansetron (ZOFRAN) 4 MG tablet Take 1 tablet (4 mg total) by mouth 2 (two) times daily. 20 tablet 1  . ONETOUCH VERIO test strip 1 each by Other route 2 (two) times daily.     Marland Kitchen oxyCODONE (OXY IR/ROXICODONE) 5 MG immediate release tablet Take 1 tablet (5 mg total) by mouth every 4 (four) hours as needed for moderate pain. 30 tablet 0  .  polyethylene glycol (MIRALAX / GLYCOLAX) 17 g packet Take 17 g by mouth daily. (Patient taking differently: Take 17 g by mouth daily as needed for mild constipation.) 14 each 0  . prochlorperazine (COMPAZINE) 10 MG tablet Take 1 tablet (10 mg total) by mouth every 6 (six) hours as needed (Nausea or vomiting). 30 tablet 1  . Vitamin D, Ergocalciferol, (DRISDOL) 1.25 MG (50000 UNIT) CAPS capsule Take 50,000 Units by mouth once a week.     No current facility-administered medications for this visit.    ALLERGIES:  Allergies  Allergen Reactions  . 5-Alpha Reductase Inhibitors   . Other     Cat/dog dander  . Pollen Extract     PHYSICAL EXAM:  Performance status (ECOG): 1 - Symptomatic but completely ambulatory  Vitals:   01/21/20 0949  BP: (!) 148/88  Pulse: 100  Resp: 18  Temp: (!) 97.2 F (36.2 C)  SpO2: 98%   Wt Readings from Last 3 Encounters:  01/13/20 107 lb (48.5 kg)  01/09/20 104 lb (47.2 kg)  01/07/20 107 lb (48.5 kg)   Physical Exam Vitals reviewed.  Constitutional:      Appearance: Normal appearance.     Interventions: Cervical collar in place.  Cardiovascular:     Rate and Rhythm: Regular rhythm. Tachycardia present.     Pulses: Normal pulses.     Heart sounds: Normal heart sounds.  Pulmonary:     Effort: Pulmonary effort is normal.     Breath sounds: Normal breath sounds.  Musculoskeletal:        General: No swelling.     Left lower leg: No edema.  Neurological:     General: No focal deficit present.     Mental Status: She is alert and oriented to person, place, and time.  Psychiatric:        Mood and Affect: Mood normal.        Behavior: Behavior normal.     LABORATORY DATA:  I have reviewed the labs as listed.  CBC Latest Ref Rng & Units  01/13/2020 12/25/2019 12/11/2019  WBC 4.0 - 10.5 K/uL 10.6(H) 8.1 10.4  Hemoglobin 12.0 - 15.0 g/dL 7.9(L) 9.4(L) 8.8(L)  Hematocrit 36.0 - 46.0 % 26.3(L) 31.5(L) 28.7(L)  Platelets 150 - 400 K/uL 315 350 306    CMP Latest Ref Rng & Units 01/13/2020 12/25/2019 12/12/2019  Glucose 70 - 99 mg/dL 125(H) 131(H) 89  BUN 8 - 23 mg/dL 14 8 5(L)  Creatinine 0.44 - 1.00 mg/dL 0.73 0.92 0.68  Sodium 135 - 145 mmol/L 133(L) 137 136  Potassium 3.5 - 5.1 mmol/L 3.9 4.1 3.2(L)  Chloride 98 - 111 mmol/L 95(L) 99 101  CO2 22 - 32 mmol/L _0 Calcium 8.9 - 10.3 mg/dL 9.7 10.5(H) 8.8(L)  Total Protein 6.5 - 8.1 g/dL 8.6(H) 9.1(H) -  Total Bilirubin 0.3 - 1.2 mg/dL 1.0 0.5 -  Alkaline Phos 38 - 126 U/L 340(H) 269(H) -  AST 15 - 41 U/L 99(H) 81(H) -  ALT 0 - 44 U/L 20 25 -    DIAGNOSTIC IMAGING:  I have independently reviewed the scans and discussed with the patient. DG OP Swallowing Func-Medicare/Speech Path  Result Date: 01/06/2020 [place], [place], [address] Phone: [phone]   Fax:  [phone]  Modified Barium Swallow  Patient Details Name: OTILLIA CORDONE MRN: [id number] Date of Birth: 08-23-1960 No data recorded  Encounter Date: 01/06/2020        End of Session - 01/06/20 1234        Visit Number 1    Number of Visits 1    Authorization Type BCBS Comm PPO    SLP Start Time 0712    SLP Stop Time  1202    SLP Time Calculation (min) 27 min    Activity Tolerance Patient tolerated treatment well            Past Medical History: Diagnosis Date . Asthma   . Back pain   . Breast cancer ([facility]) 2008   left . Diabetes ([facility])   . Hypercholesterolemia   . Hypertension   . Neuropathy     extremities after chemo . Personal history of chemotherapy   . Personal history of radiation therapy 2008 . Port-A-Cath in place 12/01/2019       Past Surgical History: Procedure Laterality Date . BACK SURGERY     . BREAST LUMPECTOMY     . COLONOSCOPY       about 3 yrs ago in [place] . KNEE SURGERY       right . TONSILLECTOMY       There were no vitals filed for this visit.        Subjective Assessment - 01/06/20 1222        Subjective "I have trouble swallowing  sandwhiches, meats, and pasta."    Special Tests MBSS    Currently in Pain? No/denies                General - 01/06/20 1224          General Information   Date of Onset 12/02/19    HPI Jameshia Hayashida  is a 59 y.o. female, with past medical history of hypertension, hyperlipidemia, chemotherapy-induced neuropathy, diabetes mellitus, history of breast cancer visually diagnosed in 2008, status post radiation therapy and chemotherapy,  with recent diagnosis of metastatic disease to liver and osseous metastasis, patient was recently diagnosed with lumbar/thoracic spine osseous metastasis, liver metastasis as well. Pt MRI showed: suspected nondisplaced pathologic  fracture involving the C2 vertebral body and base of dens. She reported dysphagia to meats and breads following that and is referred for MBSS by Dr. Derek Jack. Pt reports slight improvement, but still has some trouble at times.    Type of Study MBS-Modified Barium Swallow Study    Previous Swallow Assessment N/A    Diet Prior to this Study Regular;Thin liquids    Temperature Spikes Noted No    Respiratory Status Room air    History of Recent Intubation No    Behavior/Cognition Alert;Cooperative;Pleasant mood    Oral Cavity Assessment Within Functional Limits    Oral Care Completed by SLP No    Oral Cavity - Dentition Adequate natural dentition    Vision Functional for self feeding    Self-Feeding Abilities Able to feed self    Patient Positioning Upright in chair    Baseline Vocal Quality Normal    Volitional Cough Strong    Volitional Swallow Able to elicit    Anatomy Within functional limits    Pharyngeal Secretions Not observed secondary MBS                Oral Preparation/Oral Phase - 01/06/20 1225          Oral Preparation/Oral Phase   Oral Phase Within functional limits      Electrical stimulation - Oral Phase   Was Electrical Stimulation Used No               Pharyngeal Phase - 01/06/20 1225          Pharyngeal Phase   Pharyngeal Phase Impaired       Pharyngeal - Thin   Pharyngeal- Thin Teaspoon Within functional limits;Swallow initiation at vallecula    Pharyngeal- Thin Cup Swallow initiation at vallecula;Pharyngeal residue - valleculae;Other (Comment)    Pharyngeal- Thin Straw Swallow initiation at vallecula;Pharyngeal residue - valleculae;Pharyngeal residue - posterior pharnyx      Pharyngeal - Solids   Pharyngeal- Puree Swallow initiation at vallecula;Pharyngeal residue - valleculae    Pharyngeal- Regular Pharyngeal residue - cp segment    Pharyngeal- Pill Other (Comment) untitled image  stasis at CP segment, but then clears with liquid wash     Pharyngeal Phase - Comment   Pharyngeal Comment Pt with slight swelling of posterior pharyngeal wall ~C1-2 which creates a bulge during the swallow and results with in residuals along posterior pharyngeal wall and valleculae and clears with secondary swallow      Electrical Stimulation - Pharyngeal Phase   Was Electrical Stimulation Used No               Cricopharyngeal Phase - 01/06/20 1232          Cervical Esophageal Phase   Cervical Esophageal Phase Within functional limits                 SLP Short Term Goals - 01/06/20 1239          SLP SHORT TERM GOAL #1   Title N/A                Plan - 01/06/20 1235        Clinical Impression Statement Pt presents with mild pharyngeal phase dysphagia characterized by swallow trigger at the level of the valleculae across textures and consistencies, min reduced tongue base approximation with posterior pharyngeal wall near C1-2 due to slight swelling along posterior pharyngeal wall which creates a bulge with swallow trapping tail of bolus along posterior pharyngeal wall  and valleculae. This results in min vallecular and posterior pharyngeal wall residuals which clear with a dry swallow and/or liquid wash. No penetration or aspiration observed. The barium tablet was briefly delayed before entering the UES, however passed through with liquid wash. Suspect symptoms will  continue to improve as swelling continues to dissipate along C1-2 posterior pharyngeal wall. Recommend regular textures and thin liquids, however Pt may wish to modify regular textures by chopping meats well and adding moisture, swallow 2x for each bite, and follow solids with a sip of liquid. Pt is taking her medications crushed in puree at this time and she can continue to do this if she is more comfortable, or try whole in puree or whole with water. No further SLP services indicated at this time.    Consulted and Agree with Plan of Care Patient         Patient will benefit from skilled therapeutic intervention in order to improve the following deficits and impairments:  Dysphagia, oropharyngeal phase         Recommendations/Treatment - 01/06/20 1232          Swallow Evaluation Recommendations   SLP Diet Recommendations Age appropriate regular;Thin    Liquid Administration via Cup;Straw    Medication Administration Whole meds with liquid untitled image  can try whole in puree, liquid wash, Pt prefers crushed in puree now   Supervision Patient able to self feed    Compensations Multiple dry swallows after each bite/sip;Follow solids with liquid    Postural Changes Seated upright at 90 degrees;Remain upright for at least 30 minutes after feeds/meals               Prognosis - 01/06/20 1233          Prognosis   Prognosis for Safe Diet Advancement Good untitled image  expect improvement as swelling continues to subside along C-1-2     Individuals Consulted   Consulted and Agree with Results and Recommendations Patient    Report Sent to  Referring physician         Problem List    Patient Active Problem List   Diagnosis Date Noted . Protein-calorie malnutrition, severe 12/10/2019 . Cancer associated pain 12/10/2019 . DNR (do not resuscitate) discussion   . Encounter for hospice care discussion   . Acute metabolic encephalopathy 09/02/2334 . Overdose opiate, accidental or unintentional, initial encounter ([place]) 12/08/2019  . Weakness   . Cancer related pain   . Palliative care by specialist   . Port-A-Cath in place 12/01/2019 . Hypercalcemia 11/29/2019 . Goals of care, counseling/discussion 11/28/2019 . Stage IV Metastatic Breast Cancer with Mets to Bone/Liver/Lungs-- 11/28/2019 . Lumbar facet arthropathy 10/07/2015 . Bile duct abnormality 05/08/2015 . Constipation due to opioid therapy 05/08/2015 . Genetic testing 03/17/2015 . Osteopenia determined by x-ray 03/13/2015 . Lumbar spondylosis 02/11/2015 . Lumbar radiculopathy 02/11/2015 . Chemotherapy-induced peripheral neuropathy ([facility]) 02/11/2015 . Vitamin D deficiency 02/02/2015 . Stage IV Breast cancer of upper-outer quadrant of left female breast ([place]) 12/22/2014 . High risk medication use 12/22/2014   Thank you,  Genene Churn, [place]  [facility] 01/06/2020, 12:58 PM  [place], [place], [address] Phone: [phone]   Fax:  [phone]  Name: CLAUDETTA SALLIE MRN: [id number] Date of Birth: 1960-05-29    CLINICAL DATA:  Dysphagia especially for breads and meats, recent C2 fracture, metastatic breast cancer EXAM: MODIFIED BARIUM SWALLOW TECHNIQUE: Different consistencies of barium were administered orally to the patient  by the Speech Pathologist. Imaging of the pharynx was performed in the lateral projection. The radiologist was present in the fluoroscopy room for this study, providing personal supervision. FLUOROSCOPY TIME:  Fluoroscopy Time:  2 minutes 24 seconds Radiation Exposure Index (if provided by the fluoroscopic device): 13.7 mGy Number of Acquired Spot Images: multiple fluoroscopic screen captures COMPARISON:  None FINDINGS: Thin barium, applesauce consistency, cracker consistency and a 12.5 mm diameter barium tablet were utilized. No laryngeal penetration or aspiration of any evaluated consistencies. Minimal piriform sinus and vallecular residuals with thin barium, cleared by a second swallow.  Remainder of swallowing exam unremarkable. Barium tablet passed to stomach without obstruction. Minimal prevertebral soft tissue prominence at C2 consistent with history of recent C2 fracture. IMPRESSION: Minimal piriform sinus and vallecular residuals with thin barium. Remainder of exam normal. Please refer to the Speech Pathologists report for complete details and recommendations. Electronically Signed   By: Lavonia Dana M.D.   On: 01/06/2020 12:27   IR IMAGING GUIDED PORT INSERTION  Result Date: 01/09/2020 CLINICAL DATA:  Metastatic left breast carcinoma. Needs durable venous access for planned treatment regimen. EXAM: TUNNELED PORT CATHETER PLACEMENT WITH ULTRASOUND AND FLUOROSCOPIC GUIDANCE FLUOROSCOPY TIME:  6 seconds; 1 mGy ANESTHESIA/SEDATION: Intravenous Fentanyl 49mg and Versed 181mwere administered as conscious sedation during continuous monitoring of the patient's level of consciousness and physiological / cardiorespiratory status by the radiology RN, with a total moderate sedation time of 17 minutes. TECHNIQUE: The procedure, risks, benefits, and alternatives were explained to the patient. Questions regarding the procedure were encouraged and answered. The patient understands and consents to the procedure. As antibiotic prophylaxis, cefazolin 2 g was ordered pre-procedure and administered intravenously within one hour of incision. Patency of the right IJ vein was confirmed with ultrasound with image documentation. An appropriate skin site was determined. Skin site was marked. Region was prepped using maximum barrier technique including cap and mask, sterile gown, sterile gloves, large sterile sheet, and Chlorhexidine as cutaneous antisepsis. The region was infiltrated locally with 1% lidocaine. Under real-time ultrasound guidance, the right IJ vein was accessed with a 21 gauge micropuncture needle; the needle tip within the vein was confirmed with ultrasound image documentation. Needle was exchanged  over a 018 guidewire for transitional dilator, and vascular measurement was performed. A small incision was made on the right anterior chest wall at the site of previous scar, and a subcutaneous pocket fashioned above the incision. The power-injectable port was positioned and its catheter tunneled to the right IJ dermatotomy site. The transitional dilator was exchanged over an Amplatz wire for a peel-away sheath, through which the port catheter, which had been trimmed to the appropriate length, was advanced and positioned under fluoroscopy with its tip at the cavoatrial junction. Spot chest radiograph confirms good catheter position and no pneumothorax. The port was flushed per protocol. The pocket was closed with deep interrupted and subcuticular continuous 3-0 Monocryl sutures. The incisions were covered with Dermabond then covered with a sterile dressing. The patient tolerated the procedure well. COMPLICATIONS: COMPLICATIONS None immediate IMPRESSION: Technically successful right IJ power-injectable port catheter placement. Ready for routine use. Electronically Signed   By: D Lucrezia Europe.D.   On: 01/09/2020 17:08     ASSESSMENT:   1.  Metastatic HER-2 positive breast cancer to the liver and bones:  -Presentation with left upper quadrant pain for 3 weeks and 25-30 pound weight loss in the last couple months due to decreased appetite. -CTAP with contrast on 11/02/2019 done in the  ER showed extensive hypodense lesions throughout the liver parenchyma concerning for metastatic disease. Lung nodules at the left lung base. Extensive bone metastasis. Anterior wedge compression deformity of T12 vertebral body. -Liver biopsy on 11/19/2019 shows invasive ductal carcinoma, ER/PR positive, HER-2 positive by FISH, Ki-67-20%. -MRI of the thoracic spine on 11/18/2019 shows diffuse osseous metastatic disease with no significant extradural extension.  MRI of the lumbar spine on 10/24/2019 shows multifocal osseous signal  abnormality with moderate disc degeneration at L3-L4 and L4-L5.  No epidural tumor. -CT chest with contrast showed widespread pleuroparenchymal, left hilar, osseous metastatic disease.  2. History of left breast cancer: -Diagnosed in 2008, ER/PR/HER-2 positive. Treated in [place], [place]. -Completed 1 year of Herceptin. -Took 5 years of tamoxifen and 5 years of Femara. -Genetic testing was reportedly negative.  3. Osteopenia: -DEXA scan on 03/27/2019 with T score -1.0. -Started on Prolia from 03/13/2015, last injection on 09/26/2019.  PLAN:   1.  Metastatic HER-2 positive breast cancer to the bones and liver: - She is s.p C1 of docetaxel/herceptin/pertuzumab. ---Dr Delton Coombes and I have talked about the side effects of the chemotherapy regimen in detail. - She is here for FU, doing poorly, severe sore throat.  2 Mucositis,  She has been prescribed magic mouthwash Will continue oxycodone and fentanyl,  Will give her some fluids today and plan blood work today to replace electrolytes  3. Diarrhea, grade 1  No symptoms concerning for infection Encouraged taking imodium 4 mg after first loose bowel mvement followed by 2 mg after every bowel movement, max of 8 a day. She expressed understanding  4. Labs today with no major concerns.  RTC in one week. Discussed with son and grand daughter  Orders placed this encounter:  No orders of the defined types were placed in this encounter.  Benay Pike MD

## 2020-01-21 NOTE — Progress Notes (Signed)
Patient here today for hydration.  Patient tolerated IV fluids well with no complaints voiced.  Patient discharged via wheelchair in stable condition with stable vital signs.

## 2020-01-22 ENCOUNTER — Encounter (HOSPITAL_COMMUNITY): Payer: BC Managed Care – PPO

## 2020-01-27 ENCOUNTER — Other Ambulatory Visit (HOSPITAL_COMMUNITY): Payer: Self-pay | Admitting: *Deleted

## 2020-01-27 ENCOUNTER — Other Ambulatory Visit (HOSPITAL_COMMUNITY): Payer: Self-pay | Admitting: Hematology

## 2020-01-27 MED ORDER — FENTANYL 50 MCG/HR TD PT72: 1.0000 | MEDICATED_PATCH | TRANSDERMAL | 0 refills | Status: DC

## 2020-01-27 MED ORDER — FENTANYL 50 MCG/HR TD PT72: 1.0000 | MEDICATED_PATCH | TRANSDERMAL | 0 refills | Status: AC

## 2020-01-29 ENCOUNTER — Ambulatory Visit (HOSPITAL_COMMUNITY): Payer: BC Managed Care – PPO

## 2020-01-29 ENCOUNTER — Inpatient Hospital Stay (HOSPITAL_COMMUNITY): Payer: BC Managed Care – PPO | Admitting: Hematology

## 2020-01-29 ENCOUNTER — Ambulatory Visit (HOSPITAL_COMMUNITY): Payer: BC Managed Care – PPO | Admitting: Hematology

## 2020-01-29 ENCOUNTER — Other Ambulatory Visit (HOSPITAL_COMMUNITY): Payer: Self-pay

## 2020-01-29 ENCOUNTER — Other Ambulatory Visit (HOSPITAL_COMMUNITY): Payer: BC Managed Care – PPO

## 2020-01-29 ENCOUNTER — Inpatient Hospital Stay (HOSPITAL_COMMUNITY): Payer: BC Managed Care – PPO

## 2020-01-29 DIAGNOSIS — C50919 Malignant neoplasm of unspecified site of unspecified female breast: Secondary | ICD-10-CM

## 2020-01-29 DIAGNOSIS — D649 Anemia, unspecified: Secondary | ICD-10-CM

## 2020-02-03 ENCOUNTER — Other Ambulatory Visit (HOSPITAL_COMMUNITY): Payer: BC Managed Care – PPO

## 2020-02-03 ENCOUNTER — Ambulatory Visit (HOSPITAL_COMMUNITY): Payer: BC Managed Care – PPO | Admitting: Hematology

## 2020-02-03 ENCOUNTER — Ambulatory Visit (HOSPITAL_COMMUNITY): Payer: BC Managed Care – PPO

## 2020-02-05 ENCOUNTER — Ambulatory Visit (HOSPITAL_COMMUNITY): Payer: BC Managed Care – PPO

## 2020-02-06 ENCOUNTER — Other Ambulatory Visit (HOSPITAL_COMMUNITY): Payer: Self-pay

## 2020-02-06 NOTE — Telephone Encounter (Signed)
Patient called requesting refills on medications megace and metoprolol. Patient encouraged to reach out to her PCP for those refills as they were not prescribed by Dr. Ellin Saba.

## 2020-02-14 ENCOUNTER — Encounter (HOSPITAL_COMMUNITY): Payer: BC Managed Care – PPO

## 2020-02-19 DIAGNOSIS — E440 Moderate protein-calorie malnutrition: Secondary | ICD-10-CM | POA: Diagnosis not present

## 2020-02-19 DIAGNOSIS — C50919 Malignant neoplasm of unspecified site of unspecified female breast: Secondary | ICD-10-CM | POA: Diagnosis not present

## 2020-02-19 DIAGNOSIS — C7800 Secondary malignant neoplasm of unspecified lung: Secondary | ICD-10-CM | POA: Diagnosis not present

## 2020-02-19 DIAGNOSIS — S12101S Unspecified nondisplaced fracture of second cervical vertebra, sequela: Secondary | ICD-10-CM | POA: Diagnosis not present

## 2020-03-21 DIAGNOSIS — C50919 Malignant neoplasm of unspecified site of unspecified female breast: Secondary | ICD-10-CM | POA: Diagnosis not present

## 2020-03-21 DIAGNOSIS — C7800 Secondary malignant neoplasm of unspecified lung: Secondary | ICD-10-CM | POA: Diagnosis not present

## 2020-03-21 DIAGNOSIS — S12101S Unspecified nondisplaced fracture of second cervical vertebra, sequela: Secondary | ICD-10-CM | POA: Diagnosis not present

## 2020-03-21 DIAGNOSIS — E440 Moderate protein-calorie malnutrition: Secondary | ICD-10-CM | POA: Diagnosis not present

## 2020-04-03 ENCOUNTER — Other Ambulatory Visit (HOSPITAL_COMMUNITY): Payer: Self-pay

## 2020-04-03 MED ORDER — VITAMIN D (ERGOCALCIFEROL) 1.25 MG (50000 UNIT) PO CAPS: 50000.0000 [IU] | ORAL_CAPSULE | ORAL | 0 refills | Status: AC

## 2020-04-07 ENCOUNTER — Other Ambulatory Visit (HOSPITAL_COMMUNITY): Payer: Self-pay

## 2020-04-13 ENCOUNTER — Other Ambulatory Visit (HOSPITAL_COMMUNITY): Payer: Medicare Other

## 2020-04-13 ENCOUNTER — Ambulatory Visit (HOSPITAL_COMMUNITY): Payer: BC Managed Care – PPO

## 2020-04-16 ENCOUNTER — Inpatient Hospital Stay (HOSPITAL_COMMUNITY): Payer: BC Managed Care – PPO

## 2020-04-16 ENCOUNTER — Ambulatory Visit (HOSPITAL_COMMUNITY): Payer: Medicare Other | Admitting: Nurse Practitioner

## 2020-04-16 ENCOUNTER — Inpatient Hospital Stay (HOSPITAL_COMMUNITY): Payer: BC Managed Care – PPO | Attending: Hematology

## 2020-04-18 DIAGNOSIS — S12101S Unspecified nondisplaced fracture of second cervical vertebra, sequela: Secondary | ICD-10-CM | POA: Diagnosis not present

## 2020-04-18 DIAGNOSIS — C50919 Malignant neoplasm of unspecified site of unspecified female breast: Secondary | ICD-10-CM | POA: Diagnosis not present

## 2020-04-18 DIAGNOSIS — E440 Moderate protein-calorie malnutrition: Secondary | ICD-10-CM | POA: Diagnosis not present

## 2020-04-18 DIAGNOSIS — C7800 Secondary malignant neoplasm of unspecified lung: Secondary | ICD-10-CM | POA: Diagnosis not present

## 2020-05-07 ENCOUNTER — Other Ambulatory Visit: Payer: Self-pay | Admitting: Registered Nurse

## 2020-05-19 DIAGNOSIS — E440 Moderate protein-calorie malnutrition: Secondary | ICD-10-CM | POA: Diagnosis not present

## 2020-05-19 DIAGNOSIS — C7800 Secondary malignant neoplasm of unspecified lung: Secondary | ICD-10-CM | POA: Diagnosis not present

## 2020-05-19 DIAGNOSIS — S12101S Unspecified nondisplaced fracture of second cervical vertebra, sequela: Secondary | ICD-10-CM | POA: Diagnosis not present

## 2020-05-19 DIAGNOSIS — C50919 Malignant neoplasm of unspecified site of unspecified female breast: Secondary | ICD-10-CM | POA: Diagnosis not present

## 2020-06-18 DIAGNOSIS — E440 Moderate protein-calorie malnutrition: Secondary | ICD-10-CM | POA: Diagnosis not present

## 2020-06-18 DIAGNOSIS — C50919 Malignant neoplasm of unspecified site of unspecified female breast: Secondary | ICD-10-CM | POA: Diagnosis not present

## 2020-06-18 DIAGNOSIS — C7800 Secondary malignant neoplasm of unspecified lung: Secondary | ICD-10-CM | POA: Diagnosis not present

## 2020-06-18 DIAGNOSIS — S12101S Unspecified nondisplaced fracture of second cervical vertebra, sequela: Secondary | ICD-10-CM | POA: Diagnosis not present

## 2020-07-19 DIAGNOSIS — E440 Moderate protein-calorie malnutrition: Secondary | ICD-10-CM | POA: Diagnosis not present

## 2020-07-19 DIAGNOSIS — C7800 Secondary malignant neoplasm of unspecified lung: Secondary | ICD-10-CM | POA: Diagnosis not present

## 2020-07-19 DIAGNOSIS — S12101S Unspecified nondisplaced fracture of second cervical vertebra, sequela: Secondary | ICD-10-CM | POA: Diagnosis not present

## 2020-07-19 DIAGNOSIS — C50919 Malignant neoplasm of unspecified site of unspecified female breast: Secondary | ICD-10-CM | POA: Diagnosis not present

## 2020-08-04 ENCOUNTER — Other Ambulatory Visit (HOSPITAL_COMMUNITY): Payer: Self-pay

## 2020-08-18 DIAGNOSIS — E440 Moderate protein-calorie malnutrition: Secondary | ICD-10-CM | POA: Diagnosis not present

## 2020-08-18 DIAGNOSIS — C7800 Secondary malignant neoplasm of unspecified lung: Secondary | ICD-10-CM | POA: Diagnosis not present

## 2020-08-18 DIAGNOSIS — S12101S Unspecified nondisplaced fracture of second cervical vertebra, sequela: Secondary | ICD-10-CM | POA: Diagnosis not present

## 2020-08-18 DIAGNOSIS — C50919 Malignant neoplasm of unspecified site of unspecified female breast: Secondary | ICD-10-CM | POA: Diagnosis not present

## 2020-09-18 DIAGNOSIS — S12101S Unspecified nondisplaced fracture of second cervical vertebra, sequela: Secondary | ICD-10-CM | POA: Diagnosis not present

## 2020-09-18 DIAGNOSIS — C50919 Malignant neoplasm of unspecified site of unspecified female breast: Secondary | ICD-10-CM | POA: Diagnosis not present

## 2020-09-18 DIAGNOSIS — E440 Moderate protein-calorie malnutrition: Secondary | ICD-10-CM | POA: Diagnosis not present

## 2020-09-18 DIAGNOSIS — C7800 Secondary malignant neoplasm of unspecified lung: Secondary | ICD-10-CM | POA: Diagnosis not present

## 2020-10-19 DIAGNOSIS — C7800 Secondary malignant neoplasm of unspecified lung: Secondary | ICD-10-CM | POA: Diagnosis not present

## 2020-10-19 DIAGNOSIS — S12101S Unspecified nondisplaced fracture of second cervical vertebra, sequela: Secondary | ICD-10-CM | POA: Diagnosis not present

## 2020-10-19 DIAGNOSIS — C50919 Malignant neoplasm of unspecified site of unspecified female breast: Secondary | ICD-10-CM | POA: Diagnosis not present

## 2020-10-19 DIAGNOSIS — E440 Moderate protein-calorie malnutrition: Secondary | ICD-10-CM | POA: Diagnosis not present

## 2020-10-31 DEATH — deceased

## 2020-11-18 DIAGNOSIS — C7800 Secondary malignant neoplasm of unspecified lung: Secondary | ICD-10-CM | POA: Diagnosis not present

## 2020-12-19 DIAGNOSIS — C7800 Secondary malignant neoplasm of unspecified lung: Secondary | ICD-10-CM | POA: Diagnosis not present

## 2021-01-18 DIAGNOSIS — C7800 Secondary malignant neoplasm of unspecified lung: Secondary | ICD-10-CM | POA: Diagnosis not present

## 2021-02-18 DIAGNOSIS — C7800 Secondary malignant neoplasm of unspecified lung: Secondary | ICD-10-CM | POA: Diagnosis not present

## 2021-03-21 DIAGNOSIS — C7800 Secondary malignant neoplasm of unspecified lung: Secondary | ICD-10-CM | POA: Diagnosis not present

## 2021-04-18 DIAGNOSIS — C7800 Secondary malignant neoplasm of unspecified lung: Secondary | ICD-10-CM | POA: Diagnosis not present

## 2021-05-02 ENCOUNTER — Encounter (HOSPITAL_COMMUNITY): Payer: Self-pay | Admitting: Hematology

## 2021-10-25 IMAGING — MG MM DIGITAL DIAGNOSTIC UNILAT*R* W/ TOMO W/ CAD
4 series · 4 of 12 positions shown · non-contrast
Comparison: 03/27/2019 and earlier studies including 03/08/2017

CLINICAL DATA: Patient returns after screening study for evaluation
of a possible RIGHT breast mass. History of LEFT lumpectomy with
radiation and chemotherapy 2118.

EXAM:
DIGITAL DIAGNOSTIC RIGHT MAMMOGRAM WITH CAD AND TOMO
ULTRASOUND RIGHT BREAST

[R MLO synth-2D]
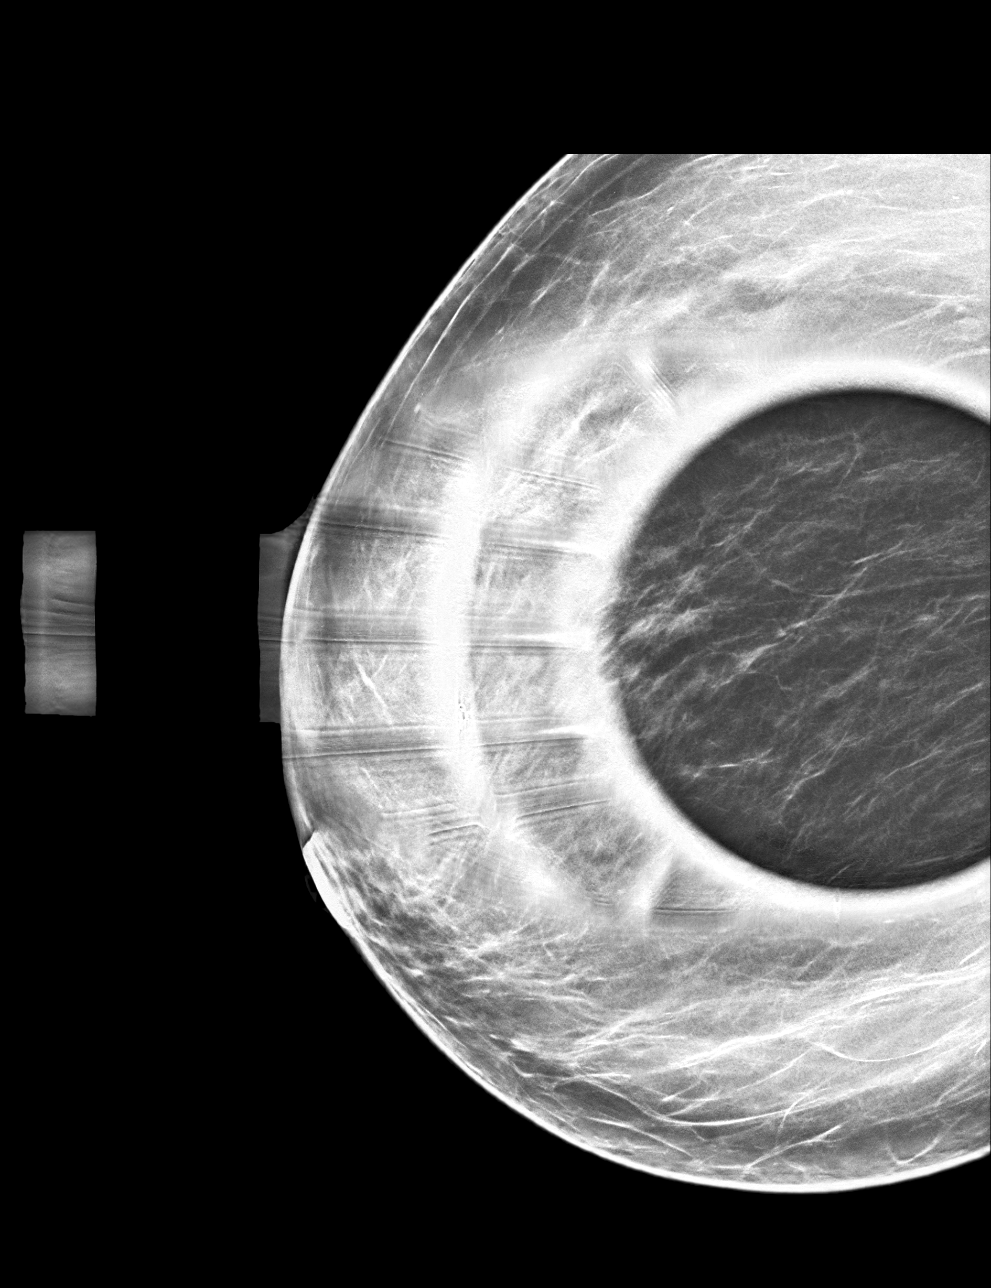

[R CC synth-2D]
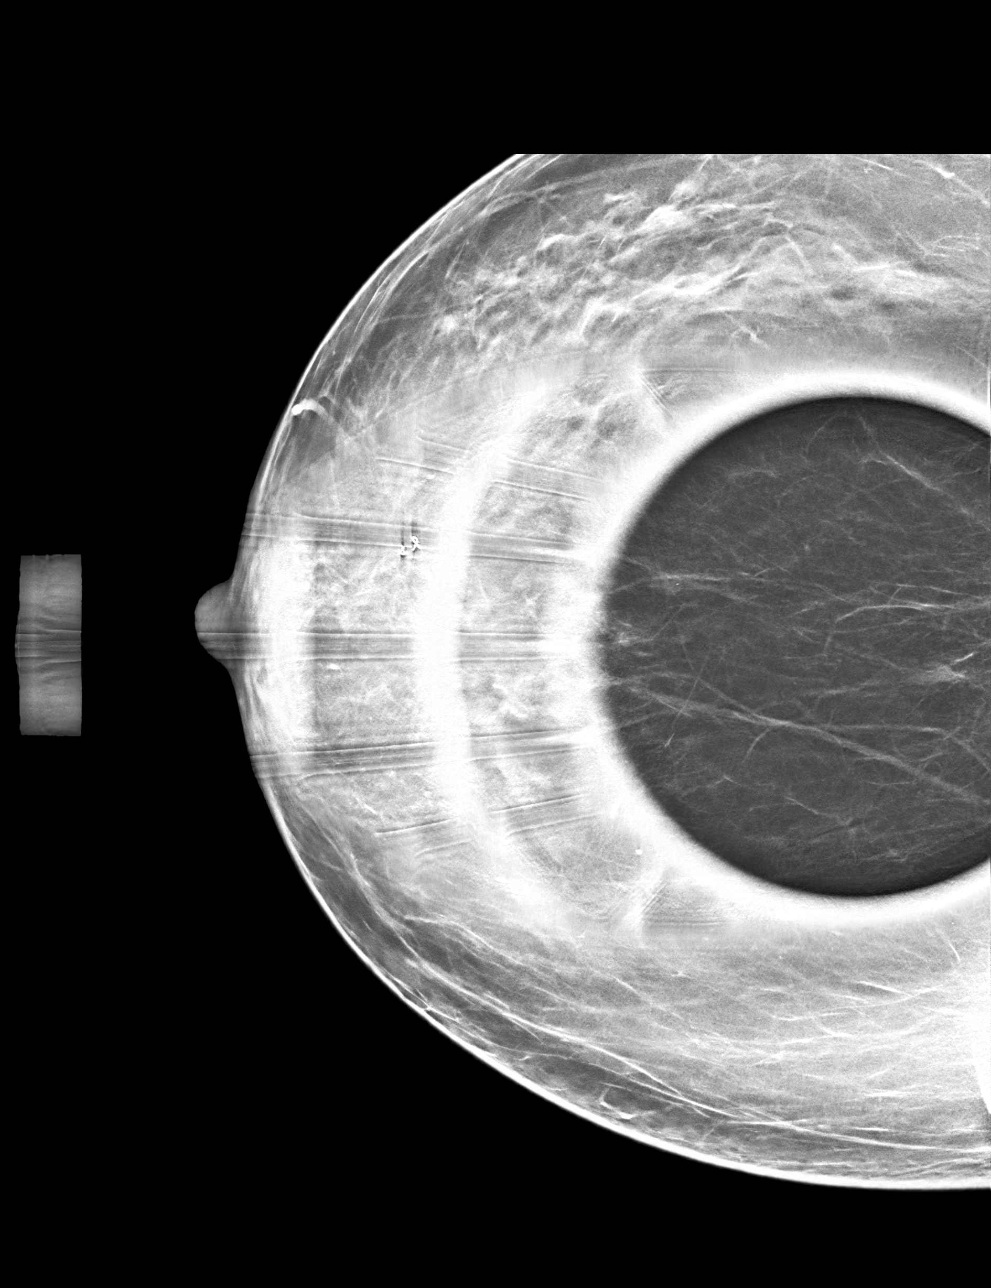

[R CC tomo · tomo slice 27/52.0]
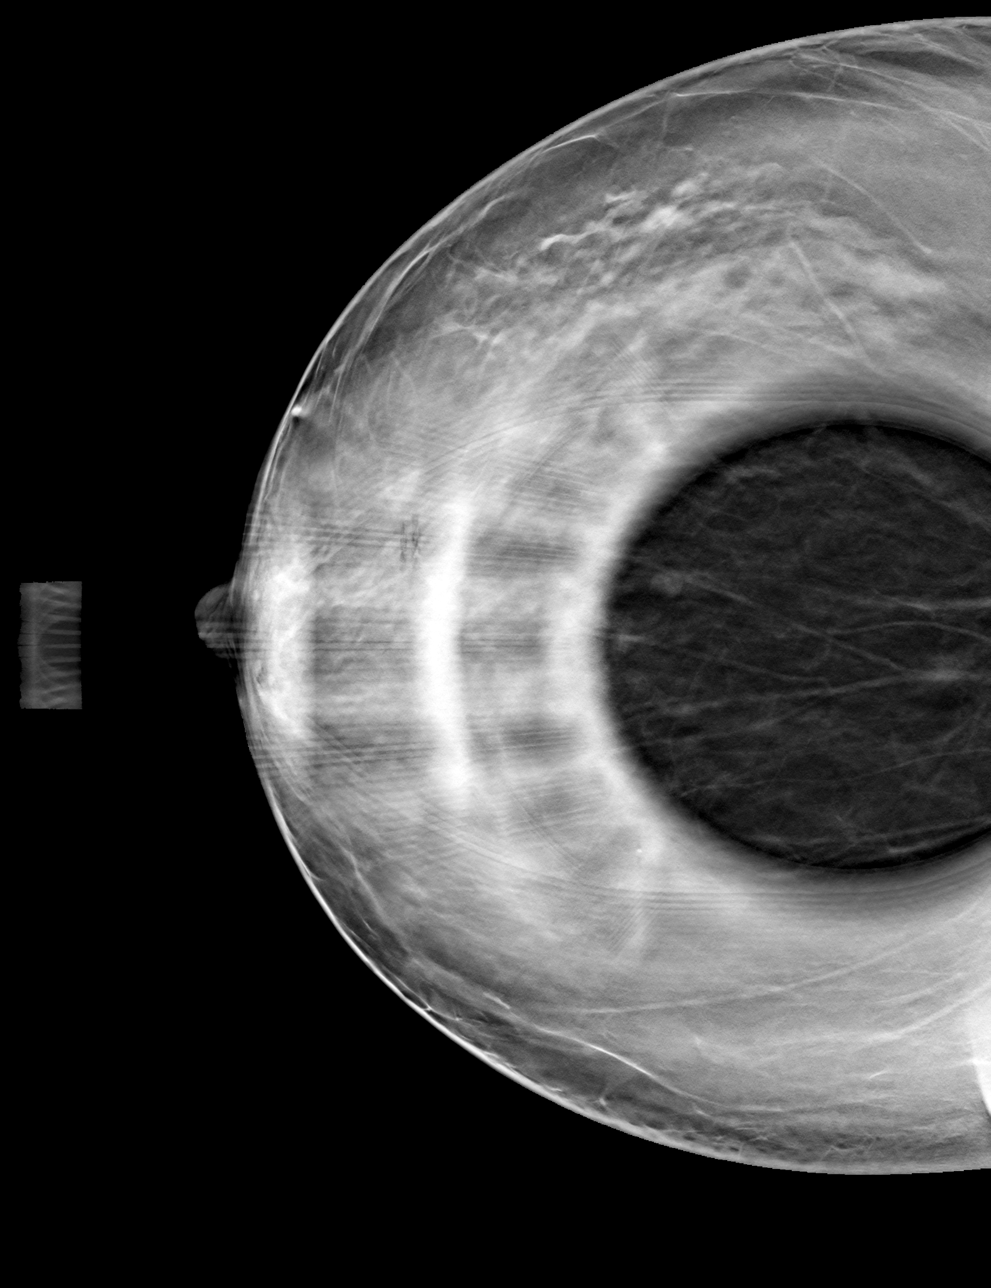

[R MLO tomo · tomo slice 25/50.0]
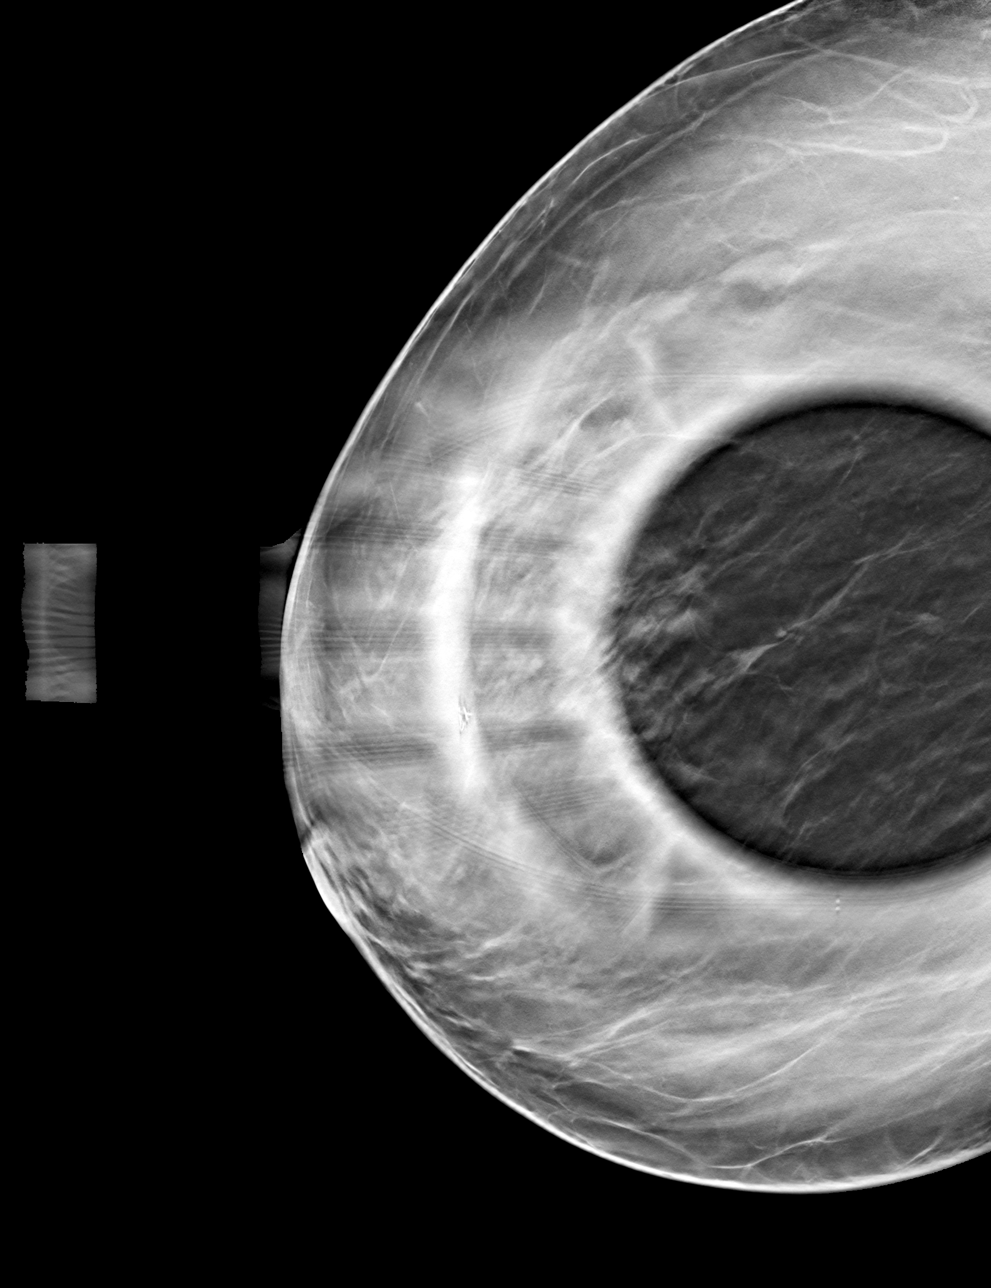

[4 of 12 positions shown; findings below may reference images not displayed]

ACR Breast Density Category b: There are scattered areas of
fibroglandular density.
FINDINGS: Additional 2-D and 3-D images are performed. These views confirm
presence of a circumscribed oval hypodense mass in the UPPER central
portion of the RIGHT breast and further evaluated with ultrasound.

Mammographic images were processed with CAD.

On physical exam, I palpate no abnormality in the UPPER central
portion of the RIGHT breast.

Targeted ultrasound is performed, showing mild duct ectasia in the
UPPER central portion of the RIGHT breast. No suspicious mass,
distortion, or acoustic shadowing is demonstrated with ultrasound.
IMPRESSION: No mammographic or ultrasound evidence for malignancy.

RECOMMENDATION:
Screening mammogram in one year.(Code:TE-S-5CM)

I have discussed the findings and recommendations with the patient.
If applicable, a reminder letter will be sent to the patient
regarding the next appointment.

BI-RADS CATEGORY  2: Benign.

## 2022-03-31 ENCOUNTER — Encounter: Payer: Self-pay | Admitting: Radiology

## 2022-06-25 IMAGING — CT CT HEAD W/O CM
3 series · 14 of 47 positions shown, 16 images · non-contrast
Comparison: 11/29/2019

CLINICAL DATA: Mental status changes metastatic breast cancer

EXAM:
CT HEAD WITHOUT CONTRAST
TECHNIQUE: Contiguous axial images were obtained from the base of the skull
through the vertex without intravenous contrast.

[Series 2: head ax w o · axial · 0.32mm/px · z∈[+29,+159]mm · 8 of 32 slices shown, 10 images]
[im 3/32  brain]
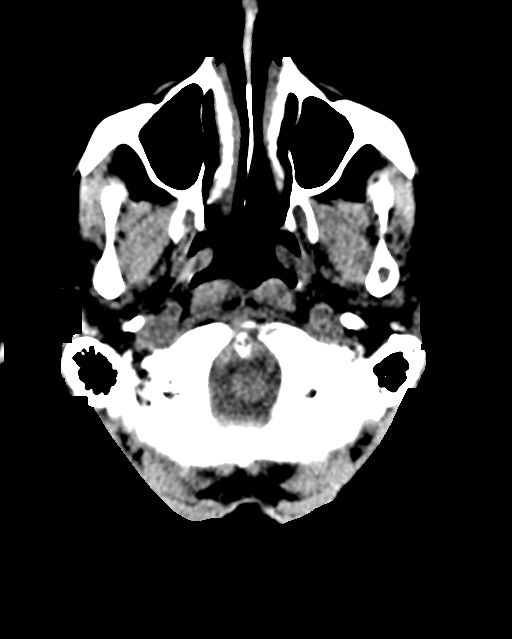
[im 3/32  bone]
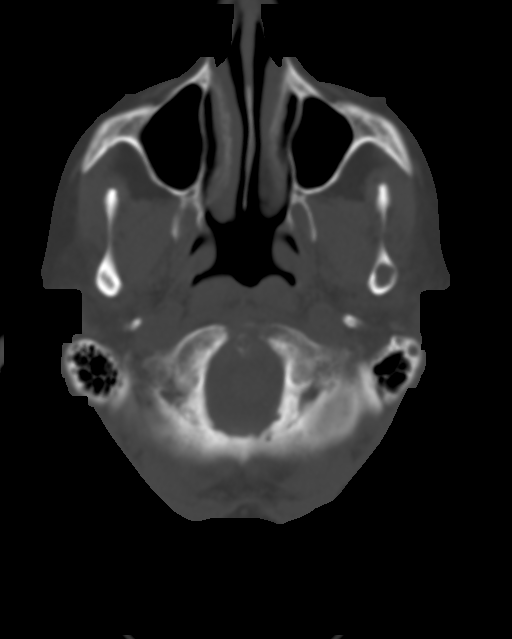
[im 7/32  brain]
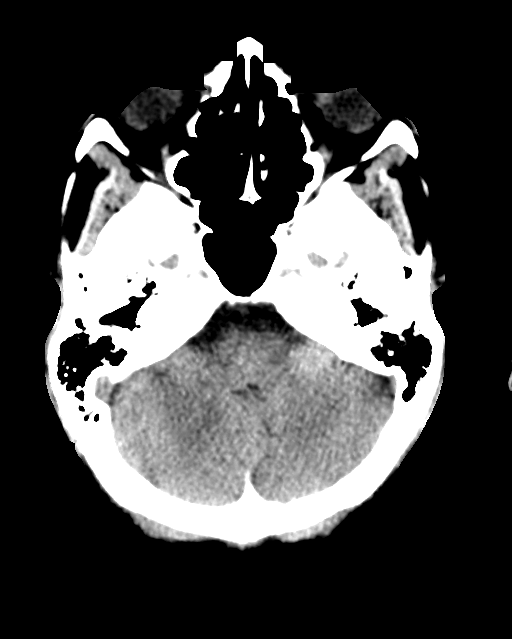
[im 10/32  brain]
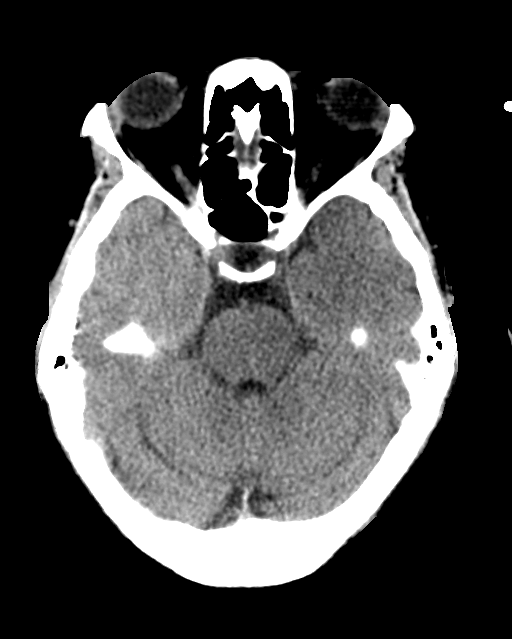
[im 14/32  brain]
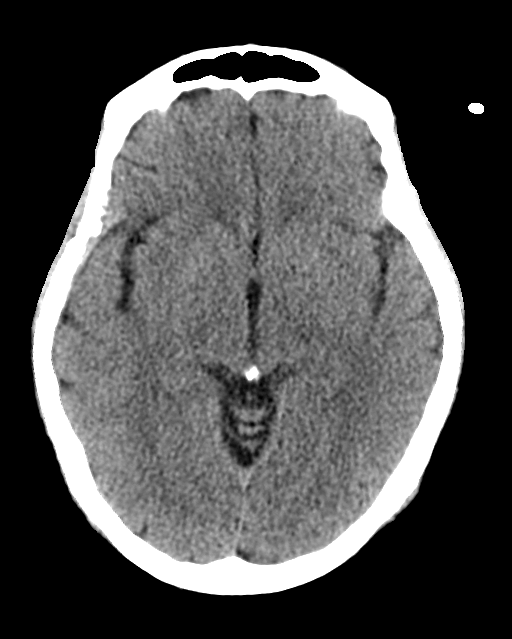
[im 18/32  brain]
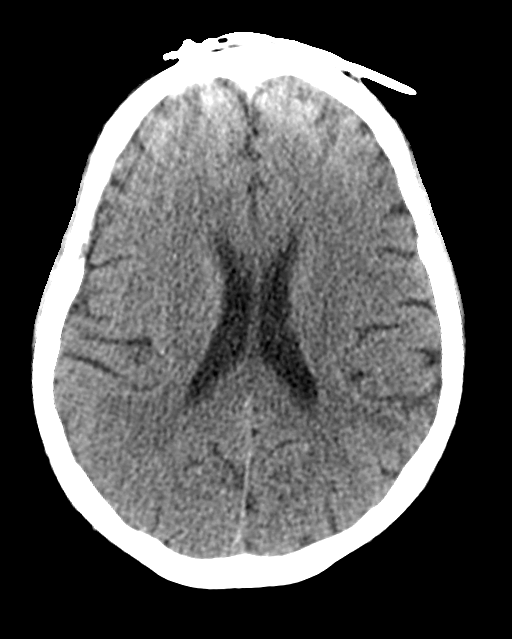
[im 18/32  bone]
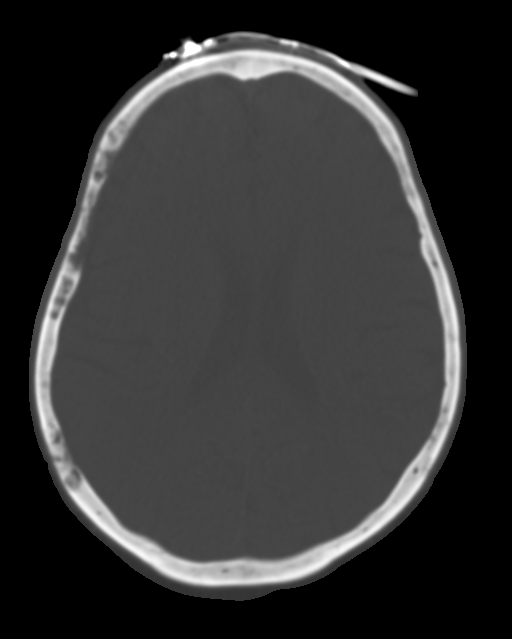
[im 22/32  brain]
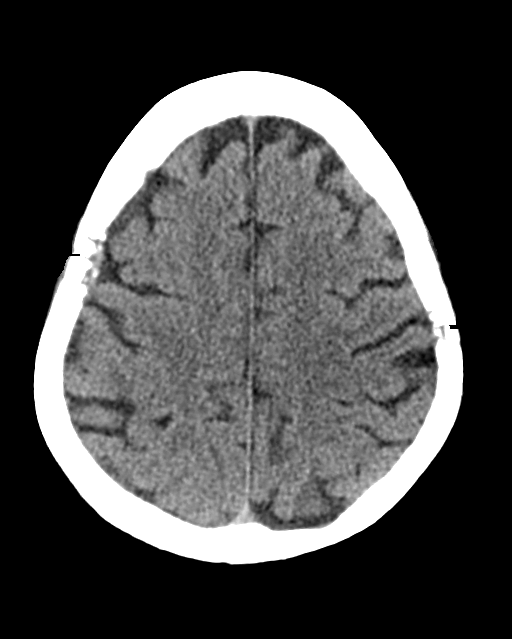
[im 25/32  brain]
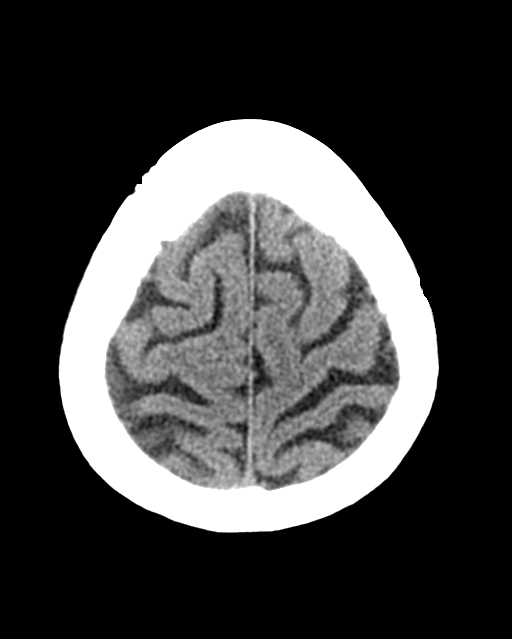
[im 29/32  brain]
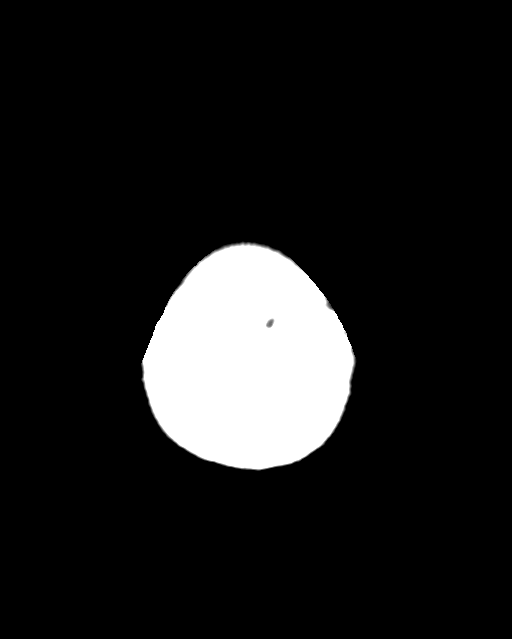

[Series 5: coronal soft · coronal · 0.32mm/px · 3 of 69 slices shown]
[im 23/69  brain]
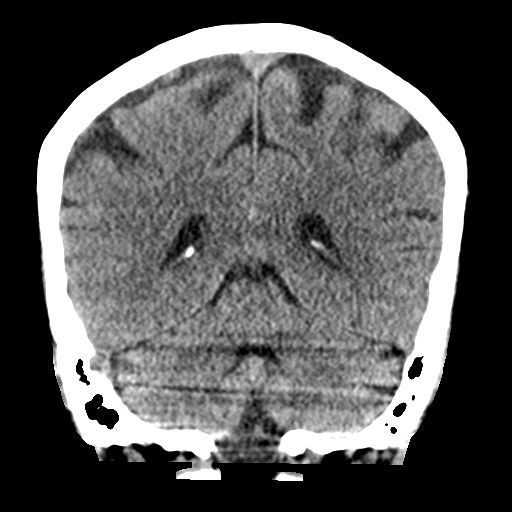
[im 31/69  brain]
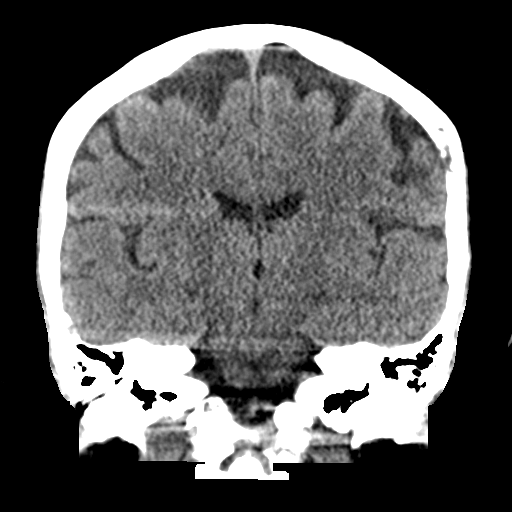
[im 38/69  brain]
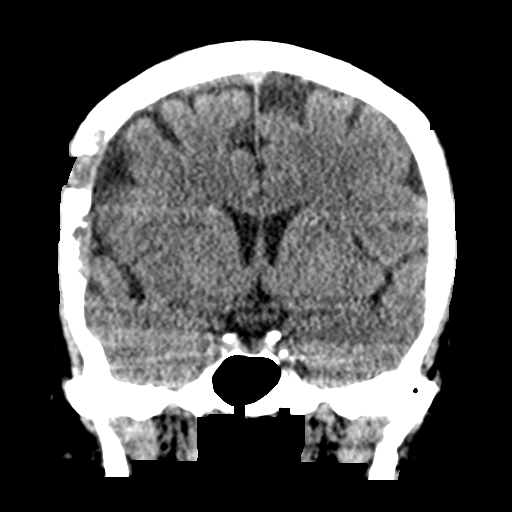

[Series 6: sagittal soft · sagittal · 0.32mm/px · 3 of 54 slices shown]
[im 18/54  brain]
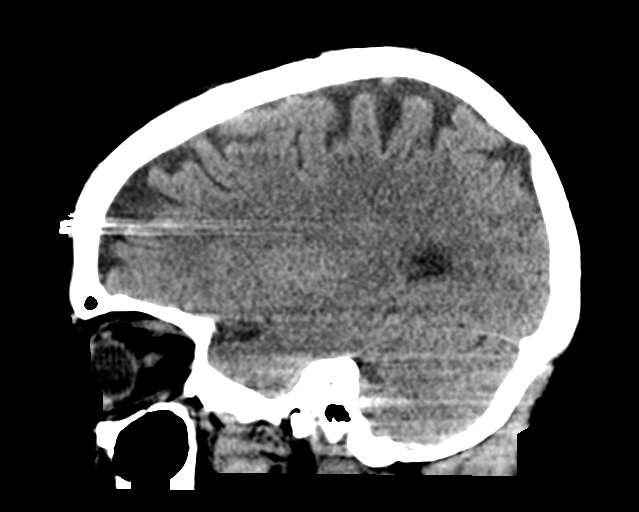
[im 27/54  brain]
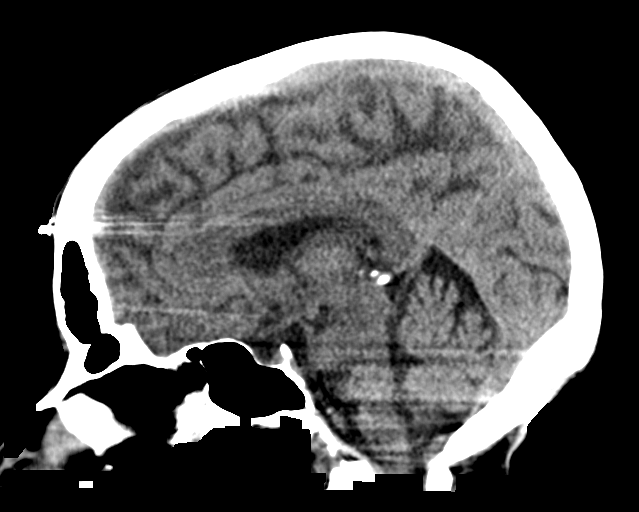
[im 36/54  brain]
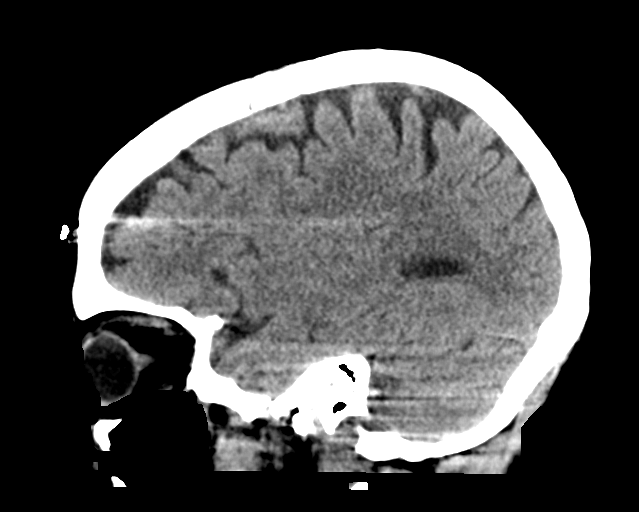

[14 of 47 positions shown; findings below may reference images not displayed]

FINDINGS: Brain: Stable age related atrophy pattern without acute intracranial
hemorrhage, acute infarction, new mass lesion, midline shift,
herniation, hydrocephalus, or extra-axial fluid collection. No focal
mass effect or edema. Cisterns are patent. No cerebellar
abnormality.

Vascular: No hyperdense vessel or unexpected calcification.

Skull: Similar mottled irregular lytic skull lesions most pronounced
in the bifrontal areas, right parietal area, right skull base and
clivus. Associated right frontal dural thickening/involvement
without change.

Sinuses/Orbits: No acute finding.

Other: None.
IMPRESSION: No acute intracranial abnormality by noncontrast CT.

Stable calvarial osseous metastases.
